# Patient Record
Sex: Female | Born: 1955 | Race: White | Hispanic: No | Smoking: Former smoker
Health system: Southern US, Community
[De-identification: ages and names within clinical notes are randomized; demographics above are authoritative.]

## PROBLEM LIST (undated history)

## (undated) DIAGNOSIS — E785 Hyperlipidemia, unspecified: Secondary | ICD-10-CM

## (undated) DIAGNOSIS — T7840XA Allergy, unspecified, initial encounter: Secondary | ICD-10-CM

## (undated) DIAGNOSIS — J449 Chronic obstructive pulmonary disease, unspecified: Secondary | ICD-10-CM

## (undated) DIAGNOSIS — F32A Depression, unspecified: Secondary | ICD-10-CM

## (undated) DIAGNOSIS — I509 Heart failure, unspecified: Secondary | ICD-10-CM

## (undated) DIAGNOSIS — J439 Emphysema, unspecified: Secondary | ICD-10-CM

## (undated) DIAGNOSIS — N393 Stress incontinence (female) (male): Secondary | ICD-10-CM

## (undated) DIAGNOSIS — M797 Fibromyalgia: Secondary | ICD-10-CM

## (undated) DIAGNOSIS — K219 Gastro-esophageal reflux disease without esophagitis: Secondary | ICD-10-CM

## (undated) DIAGNOSIS — H269 Unspecified cataract: Secondary | ICD-10-CM

## (undated) DIAGNOSIS — F419 Anxiety disorder, unspecified: Secondary | ICD-10-CM

## (undated) DIAGNOSIS — M1990 Unspecified osteoarthritis, unspecified site: Secondary | ICD-10-CM

## (undated) DIAGNOSIS — R0902 Hypoxemia: Secondary | ICD-10-CM

## (undated) DIAGNOSIS — M791 Myalgia, unspecified site: Secondary | ICD-10-CM

## (undated) DIAGNOSIS — G4730 Sleep apnea, unspecified: Secondary | ICD-10-CM

## (undated) DIAGNOSIS — L821 Other seborrheic keratosis: Secondary | ICD-10-CM

## (undated) DIAGNOSIS — F329 Major depressive disorder, single episode, unspecified: Secondary | ICD-10-CM

## (undated) DIAGNOSIS — I119 Hypertensive heart disease without heart failure: Secondary | ICD-10-CM

## (undated) DIAGNOSIS — E669 Obesity, unspecified: Secondary | ICD-10-CM

## (undated) DIAGNOSIS — I10 Essential (primary) hypertension: Secondary | ICD-10-CM

## (undated) DIAGNOSIS — I350 Nonrheumatic aortic (valve) stenosis: Secondary | ICD-10-CM

## (undated) DIAGNOSIS — I719 Aortic aneurysm of unspecified site, without rupture: Secondary | ICD-10-CM

## (undated) DIAGNOSIS — M5430 Sciatica, unspecified side: Secondary | ICD-10-CM

## (undated) HISTORY — DX: Unspecified cataract: H26.9

## (undated) HISTORY — PX: TOTAL ABDOMINAL HYSTERECTOMY: SHX209

## (undated) HISTORY — DX: Sciatica, unspecified side: M54.30

## (undated) HISTORY — DX: Emphysema, unspecified: J43.9

## (undated) HISTORY — DX: Unspecified osteoarthritis, unspecified site: M19.90

## (undated) HISTORY — DX: Heart failure, unspecified: I50.9

## (undated) HISTORY — PX: TONSILLECTOMY: SUR1361

## (undated) HISTORY — DX: Gastro-esophageal reflux disease without esophagitis: K21.9

## (undated) HISTORY — DX: Nonrheumatic aortic (valve) stenosis: I35.0

## (undated) HISTORY — DX: Stress incontinence (female) (male): N39.3

## (undated) HISTORY — DX: Fibromyalgia: M79.7

## (undated) HISTORY — DX: Chronic obstructive pulmonary disease, unspecified: J44.9

## (undated) HISTORY — DX: Major depressive disorder, single episode, unspecified: F32.9

## (undated) HISTORY — DX: Hypoxemia: R09.02

## (undated) HISTORY — PX: EYE SURGERY: SHX253

## (undated) HISTORY — DX: Hyperlipidemia, unspecified: E78.5

## (undated) HISTORY — DX: Myalgia, unspecified site: M79.10

## (undated) HISTORY — DX: Depression, unspecified: F32.A

## (undated) HISTORY — PX: TUBAL LIGATION: SHX77

## (undated) HISTORY — DX: Allergy, unspecified, initial encounter: T78.40XA

## (undated) HISTORY — DX: Essential (primary) hypertension: I10

## (undated) HISTORY — PX: ABDOMINAL HYSTERECTOMY: SHX81

## (undated) HISTORY — DX: Obesity, unspecified: E66.9

## (undated) HISTORY — DX: Anxiety disorder, unspecified: F41.9

## (undated) HISTORY — DX: Hypertensive heart disease without heart failure: I11.9

## (undated) HISTORY — PX: CARDIAC CATHETERIZATION: SHX172

## (undated) HISTORY — DX: Other seborrheic keratosis: L82.1

---

## 2004-06-20 LAB — HM MAMMOGRAPHY

## 2005-04-01 LAB — HM PAP SMEAR

## 2007-12-29 ENCOUNTER — Ambulatory Visit: Payer: Self-pay | Admitting: Ophthalmology

## 2008-01-11 ENCOUNTER — Ambulatory Visit: Payer: Self-pay | Admitting: Ophthalmology

## 2008-03-13 ENCOUNTER — Ambulatory Visit: Payer: Self-pay | Admitting: Ophthalmology

## 2008-07-23 ENCOUNTER — Emergency Department: Payer: Self-pay | Admitting: Emergency Medicine

## 2009-02-19 ENCOUNTER — Inpatient Hospital Stay: Payer: Self-pay | Admitting: Internal Medicine

## 2011-02-10 ENCOUNTER — Emergency Department: Payer: Self-pay | Admitting: *Deleted

## 2012-11-14 ENCOUNTER — Emergency Department: Payer: Self-pay | Admitting: Internal Medicine

## 2012-11-14 LAB — COMPREHENSIVE METABOLIC PANEL
Albumin: 3.9 g/dL (ref 3.4–5.0)
Alkaline Phosphatase: 88 U/L (ref 50–136)
BUN: 7 mg/dL (ref 7–18)
Bilirubin,Total: 0.4 mg/dL (ref 0.2–1.0)
Calcium, Total: 7.8 mg/dL — ABNORMAL LOW (ref 8.5–10.1)
Co2: 31 mmol/L (ref 21–32)
EGFR (African American): >60
EGFR (Non-African Amer.): >60
Osmolality: 280 (ref 275–301)
Potassium: 3.7 mmol/L (ref 3.5–5.1)
SGOT(AST): 25 U/L (ref 15–37)
SGPT (ALT): 22 U/L (ref 12–78)
Sodium: 142 mmol/L (ref 136–145)
Total Protein: 7.3 g/dL (ref 6.4–8.2)

## 2012-11-14 LAB — URINALYSIS, COMPLETE
Bilirubin,UR: NEGATIVE
Glucose,UR: NEGATIVE mg/dL (ref 0–75)
Ketone: NEGATIVE
Leukocyte Esterase: NEGATIVE
Nitrite: NEGATIVE
Protein: NEGATIVE
Specific Gravity: 1.002 (ref 1.003–1.030)
Squamous Epithelial: NONE SEEN

## 2012-11-14 LAB — CBC
HGB: 15.3 g/dL (ref 12.0–16.0)
MCHC: 33.9 g/dL (ref 32.0–36.0)
MCV: 90 fL (ref 80–100)
Platelet: 148 10*3/uL — ABNORMAL LOW (ref 150–440)
WBC: 7.1 10*3/uL (ref 3.6–11.0)

## 2013-01-13 ENCOUNTER — Emergency Department: Payer: Self-pay | Admitting: Emergency Medicine

## 2013-01-13 LAB — COMPREHENSIVE METABOLIC PANEL
Alkaline Phosphatase: 72 U/L
BUN: 6 mg/dL — ABNORMAL LOW (ref 7–18)
Chloride: 108 mmol/L — ABNORMAL HIGH (ref 98–107)
Co2: 30 mmol/L (ref 21–32)
Creatinine: 0.68 mg/dL (ref 0.60–1.30)
EGFR (African American): >60
Glucose: 128 mg/dL — ABNORMAL HIGH (ref 65–99)
Osmolality: 284 (ref 275–301)
Potassium: 3.1 mmol/L — ABNORMAL LOW (ref 3.5–5.1)
SGOT(AST): 29 U/L (ref 15–37)
Sodium: 143 mmol/L (ref 136–145)

## 2013-01-13 LAB — URINALYSIS, COMPLETE
Bacteria: NONE SEEN
Blood: NEGATIVE
Nitrite: NEGATIVE
Ph: 7 (ref 4.5–8.0)
Protein: NEGATIVE
Specific Gravity: 1.004 (ref 1.003–1.030)
WBC UR: <1 /HPF (ref 0–5)

## 2013-01-13 LAB — CBC WITH DIFFERENTIAL/PLATELET
Basophil #: 0.1 10*3/uL (ref 0.0–0.1)
Eosinophil #: 0.2 10*3/uL (ref 0.0–0.7)
Eosinophil %: 2.4 %
HCT: 41.2 % (ref 35.0–47.0)
HGB: 13.9 g/dL (ref 12.0–16.0)
Lymphocyte %: 24.6 %
MCH: 30.1 pg (ref 26.0–34.0)
MCV: 89 fL (ref 80–100)
Neutrophil %: 65.8 %
Platelet: 112 10*3/uL — ABNORMAL LOW (ref 150–440)
RBC: 4.62 10*6/uL (ref 3.80–5.20)
RDW: 15.4 % — ABNORMAL HIGH (ref 11.5–14.5)
WBC: 7.4 10*3/uL (ref 3.6–11.0)

## 2013-01-13 LAB — MAGNESIUM: Magnesium: 0.7 mg/dL — ABNORMAL LOW

## 2013-06-16 ENCOUNTER — Emergency Department: Payer: Self-pay | Admitting: Emergency Medicine

## 2013-06-16 LAB — URINALYSIS, COMPLETE
Bilirubin,UR: NEGATIVE
Glucose,UR: NEGATIVE mg/dL (ref 0–75)
Ketone: NEGATIVE
Leukocyte Esterase: NEGATIVE
Nitrite: NEGATIVE
PH: 7 (ref 4.5–8.0)
Protein: NEGATIVE
RBC,UR: 2 /HPF (ref 0–5)
SPECIFIC GRAVITY: 1.017 (ref 1.003–1.030)
Squamous Epithelial: 5

## 2013-06-16 LAB — COMPREHENSIVE METABOLIC PANEL
ALK PHOS: 80 U/L
Albumin: 3.8 g/dL (ref 3.4–5.0)
Anion Gap: 9 (ref 7–16)
BUN: 13 mg/dL (ref 7–18)
Bilirubin,Total: 0.3 mg/dL (ref 0.2–1.0)
CALCIUM: 9 mg/dL (ref 8.5–10.1)
CHLORIDE: 103 mmol/L (ref 98–107)
CREATININE: 0.63 mg/dL (ref 0.60–1.30)
Co2: 31 mmol/L (ref 21–32)
EGFR (African American): >60
GLUCOSE: 93 mg/dL (ref 65–99)
OSMOLALITY: 285 (ref 275–301)
Potassium: 4 mmol/L (ref 3.5–5.1)
SGOT(AST): 17 U/L (ref 15–37)
SGPT (ALT): 17 U/L (ref 12–78)
Sodium: 143 mmol/L (ref 136–145)
Total Protein: 7.5 g/dL (ref 6.4–8.2)

## 2013-06-16 LAB — CBC WITH DIFFERENTIAL/PLATELET
BASOS PCT: 0.6 %
Basophil #: 0.1 10*3/uL (ref 0.0–0.1)
EOS ABS: 0.1 10*3/uL (ref 0.0–0.7)
EOS PCT: 1.3 %
HCT: 45.6 % (ref 35.0–47.0)
HGB: 14.9 g/dL (ref 12.0–16.0)
Lymphocyte #: 2.4 10*3/uL (ref 1.0–3.6)
Lymphocyte %: 26.2 %
MCH: 30.4 pg (ref 26.0–34.0)
MCHC: 32.8 g/dL (ref 32.0–36.0)
MCV: 93 fL (ref 80–100)
MONO ABS: 0.6 x10 3/mm (ref 0.2–0.9)
MONOS PCT: 6.3 %
Neutrophil #: 6 10*3/uL (ref 1.4–6.5)
Neutrophil %: 65.6 %
PLATELETS: 126 10*3/uL — AB (ref 150–440)
RBC: 4.91 10*6/uL (ref 3.80–5.20)
RDW: 15 % — AB (ref 11.5–14.5)
WBC: 9.1 10*3/uL (ref 3.6–11.0)

## 2013-06-16 LAB — LIPASE, BLOOD: LIPASE: 270 U/L (ref 73–393)

## 2013-06-16 LAB — TROPONIN I: Troponin-I: <0.02 ng/mL

## 2013-06-16 LAB — MAGNESIUM: Magnesium: 1.4 mg/dL — ABNORMAL LOW

## 2013-10-28 ENCOUNTER — Emergency Department: Payer: Self-pay | Admitting: Emergency Medicine

## 2014-03-27 ENCOUNTER — Ambulatory Visit: Payer: Self-pay

## 2014-10-16 ENCOUNTER — Ambulatory Visit: Payer: Self-pay | Admitting: Unknown Physician Specialty

## 2014-12-08 ENCOUNTER — Ambulatory Visit: Payer: Self-pay | Admitting: Unknown Physician Specialty

## 2015-01-05 ENCOUNTER — Encounter: Payer: Self-pay | Admitting: Unknown Physician Specialty

## 2015-01-05 ENCOUNTER — Ambulatory Visit (INDEPENDENT_AMBULATORY_CARE_PROVIDER_SITE_OTHER): Payer: BLUE CROSS/BLUE SHIELD | Admitting: Unknown Physician Specialty

## 2015-01-05 VITALS — BP 123/71 | HR 69 | Temp 98.6°F | Ht 65.2 in | Wt 198.2 lb

## 2015-01-05 DIAGNOSIS — G4700 Insomnia, unspecified: Secondary | ICD-10-CM

## 2015-01-05 DIAGNOSIS — I119 Hypertensive heart disease without heart failure: Secondary | ICD-10-CM | POA: Insufficient documentation

## 2015-01-05 DIAGNOSIS — M797 Fibromyalgia: Secondary | ICD-10-CM

## 2015-01-05 DIAGNOSIS — E785 Hyperlipidemia, unspecified: Secondary | ICD-10-CM | POA: Diagnosis not present

## 2015-01-05 DIAGNOSIS — L821 Other seborrheic keratosis: Secondary | ICD-10-CM

## 2015-01-05 DIAGNOSIS — J432 Centrilobular emphysema: Secondary | ICD-10-CM | POA: Insufficient documentation

## 2015-01-05 DIAGNOSIS — J42 Unspecified chronic bronchitis: Secondary | ICD-10-CM

## 2015-01-05 DIAGNOSIS — K219 Gastro-esophageal reflux disease without esophagitis: Secondary | ICD-10-CM | POA: Insufficient documentation

## 2015-01-05 DIAGNOSIS — J209 Acute bronchitis, unspecified: Secondary | ICD-10-CM | POA: Diagnosis not present

## 2015-01-05 DIAGNOSIS — I509 Heart failure, unspecified: Secondary | ICD-10-CM

## 2015-01-05 DIAGNOSIS — J449 Chronic obstructive pulmonary disease, unspecified: Secondary | ICD-10-CM

## 2015-01-05 DIAGNOSIS — I350 Nonrheumatic aortic (valve) stenosis: Secondary | ICD-10-CM | POA: Insufficient documentation

## 2015-01-05 DIAGNOSIS — M5430 Sciatica, unspecified side: Secondary | ICD-10-CM | POA: Insufficient documentation

## 2015-01-05 DIAGNOSIS — I129 Hypertensive chronic kidney disease with stage 1 through stage 4 chronic kidney disease, or unspecified chronic kidney disease: Secondary | ICD-10-CM

## 2015-01-05 DIAGNOSIS — E669 Obesity, unspecified: Secondary | ICD-10-CM | POA: Insufficient documentation

## 2015-01-05 DIAGNOSIS — I110 Hypertensive heart disease with heart failure: Secondary | ICD-10-CM

## 2015-01-05 DIAGNOSIS — Z0000 Encounter for general adult medical examination without abnormal findings: Secondary | ICD-10-CM | POA: Diagnosis not present

## 2015-01-05 DIAGNOSIS — F439 Reaction to severe stress, unspecified: Secondary | ICD-10-CM

## 2015-01-05 LAB — LIPID PANEL PICCOLO, WAIVED
CHOL/HDL RATIO PICCOLO,WAIVE: 3.7 mg/dL
Cholesterol Piccolo, Waived: 142 mg/dL (ref <200)
HDL CHOL PICCOLO, WAIVED: 39 mg/dL — AB (ref >59)
LDL Chol Calc Piccolo Waived: 76 mg/dL (ref <100)
TRIGLYCERIDES PICCOLO,WAIVED: 138 mg/dL (ref <150)
VLDL CHOL CALC PICCOLO,WAIVE: 28 mg/dL (ref <30)

## 2015-01-05 LAB — MICROALBUMIN, URINE WAIVED
Creatinine, Urine Waived: 50 mg/dL (ref 10–300)
Microalb, Ur Waived: 10 mg/L (ref 0–19)
Microalb/Creat Ratio: <30 mg/g (ref <30)

## 2015-01-05 MED ORDER — DOXYCYCLINE HYCLATE 100 MG PO TABS: 100.0000 mg | ORAL_TABLET | Freq: Two times a day (BID) | ORAL | Status: DC

## 2015-01-05 MED ORDER — ALBUTEROL SULFATE HFA 108 (90 BASE) MCG/ACT IN AERS: 2.0000 | INHALATION_SPRAY | Freq: Four times a day (QID) | RESPIRATORY_TRACT | Status: DC | PRN

## 2015-01-05 NOTE — Assessment & Plan Note (Signed)
Reviewing Lipid panel.  LDL is 76.  Stable, continue present medications.

## 2015-01-05 NOTE — Patient Instructions (Signed)
Please do call to schedule your mammogram; the number to schedule one at either [facility] or [facility] is [phone]   

## 2015-01-05 NOTE — Progress Notes (Signed)
BP 123/71 mmHg  Pulse 69  Temp(Src) 98.6 F (37 C)  Ht 5' 5.2" (1.656 m)  Wt 198 lb 3.2 oz (89.903 kg)  BMI 32.78 kg/m2  SpO2 89%  LMP  (LMP Unknown)   Subjective:    Patient ID: Brandi Steele, female    DOB: 12-05-1955, 59 y.o.   MRN: [id number]  HPI: GARIELLE Steele is a 59 y.o. female  Chief Complaint  Patient presents with  . Hypertension  . Hyperlipidemia  . other    pt states she is interested in a mammogram and colonoscopy  . Medication Refill    pt states she needs a refill on her inhaler   Hypertension Using medications without difficulty   No problems or lightheadedness No chest pain with exertion or shortness of breath No Edema  Hyperlipidemia Using medications without problems No Muscle aches  Diet compliance: Exercise:  COPD Feels symptoms are well controlled except having a bout of the flu.  Needs a refill of inhaler that she uses BID Using medications without problems: Taking Proair prn and not using when she is sick but on a cruise and had an acute exacerbation of COPD ER visits since last visit: nebulizer on ship.  Did not get an abtibiotic Increased cough: yes Increased SOB: yes She is smoking 1/2 ppd.    Relevant past medical, surgical, family and social history reviewed and updated as indicated. Interim medical history since our last visit reviewed. Allergies and medications reviewed and updated.  Review of Systems  Per HPI unless specifically indicated above     Objective:    BP 123/71 mmHg  Pulse 69  Temp(Src) 98.6 F (37 C)  Ht 5' 5.2" (1.656 m)  Wt 198 lb 3.2 oz (89.903 kg)  BMI 32.78 kg/m2  SpO2 89%  LMP  (LMP Unknown)  Wt Readings from Last 3 Encounters:  01/05/15 198 lb 3.2 oz (89.903 kg)  04/24/14 197 lb (89.359 kg)    Physical Exam  Constitutional: She is oriented to person, place, and time. She appears well-developed and well-nourished. No distress.  HENT:  Head: Normocephalic and atraumatic.  Eyes:  Conjunctivae and lids are normal. Right eye exhibits no discharge. Left eye exhibits no discharge. No scleral icterus.  Neck: Normal range of motion. Neck supple. No JVD present. Carotid bruit is not present.  Cardiovascular: Normal rate, regular rhythm and normal heart sounds.   Pulmonary/Chest: Effort normal. No respiratory distress. She has wheezes. She has rales.  Abdominal: Normal appearance. There is no splenomegaly or hepatomegaly.  Musculoskeletal: Normal range of motion.  Neurological: She is alert and oriented to person, place, and time.  Skin: Skin is warm, dry and intact. No rash noted. No pallor.  Psychiatric: She has a normal mood and affect. Her behavior is normal. Judgment and thought content normal.       Assessment & Plan:   Problem List Items Addressed This Visit      Unprioritized   COPD (chronic obstructive pulmonary disease) (HCC)   Relevant Medications   albuterol (PROVENTIL HFA;VENTOLIN HFA) 108 (90 BASE) MCG/ACT inhaler   Hyperlipidemia - Primary    Reviewing Lipid panel.  LDL is 76.  Stable, continue present medications.        Relevant Orders   Lipid Panel [place], Waived   Hypertensive CKD (chronic kidney disease)    Stable, continue present medications.        Relevant Orders   Uric acid   Microalbumin, Urine Waived  Comprehensive metabolic panel    Other Visit Diagnoses    Acute exacerbation of chronic bronchitis ([place])        Rx for Doxycycline.  Chest x-ray last year.      Relevant Medications    albuterol (PROVENTIL HFA;VENTOLIN HFA) 108 (90 BASE) MCG/ACT inhaler    Routine general medical examination at a health care facility        Relevant Orders    Ambulatory referral to Gastroenterology    MM DIGITAL SCREENING BILATERAL       Set up for low dose CT screening.   Health maintenance items of colonoscopy and mammogram  Follow up plan: Return for physical and needs spirometry.

## 2015-01-05 NOTE — Assessment & Plan Note (Signed)
Stable, continue present medications.   

## 2015-01-06 LAB — COMPREHENSIVE METABOLIC PANEL
ALBUMIN: 4.2 g/dL (ref 3.6–4.8)
ALT: 23 IU/L (ref 0–32)
AST: 21 IU/L (ref 0–40)
Albumin/Globulin Ratio: 2 (ref 1.1–2.5)
Alkaline Phosphatase: 63 IU/L (ref 39–117)
BUN / CREAT RATIO: 18 (ref 11–26)
BUN: 15 mg/dL (ref 8–27)
Bilirubin Total: 0.2 mg/dL (ref 0.0–1.2)
CO2: 29 mmol/L (ref 18–29)
CREATININE: 0.83 mg/dL (ref 0.57–1.00)
Calcium: 8.9 mg/dL (ref 8.7–10.3)
Chloride: 100 mmol/L (ref 97–106)
GFR calc Af Amer: 88 mL/min/{1.73_m2} (ref >59)
GFR, EST NON AFRICAN AMERICAN: 76 mL/min/{1.73_m2} (ref >59)
GLUCOSE: 87 mg/dL (ref 65–99)
Globulin, Total: 2.1 g/dL (ref 1.5–4.5)
Potassium: 4.4 mmol/L (ref 3.5–5.2)
Sodium: 143 mmol/L (ref 136–144)
TOTAL PROTEIN: 6.3 g/dL (ref 6.0–8.5)

## 2015-01-06 LAB — URIC ACID: Uric Acid: 5.1 mg/dL (ref 2.5–7.1)

## 2015-01-07 ENCOUNTER — Encounter (HOSPITAL_COMMUNITY): Payer: Self-pay | Admitting: Nurse Practitioner

## 2015-01-07 ENCOUNTER — Emergency Department (HOSPITAL_COMMUNITY): Payer: BLUE CROSS/BLUE SHIELD

## 2015-01-07 ENCOUNTER — Emergency Department (HOSPITAL_COMMUNITY)
Admission: EM | Admit: 2015-01-07 | Discharge: 2015-01-08 | Disposition: A | Discharge status: Home / Self Care | Payer: BLUE CROSS/BLUE SHIELD | Attending: Emergency Medicine | Admitting: Emergency Medicine

## 2015-01-07 DIAGNOSIS — I509 Heart failure, unspecified: Secondary | ICD-10-CM | POA: Insufficient documentation

## 2015-01-07 DIAGNOSIS — Z8659 Personal history of other mental and behavioral disorders: Secondary | ICD-10-CM | POA: Diagnosis not present

## 2015-01-07 DIAGNOSIS — I110 Hypertensive heart disease with heart failure: Secondary | ICD-10-CM | POA: Insufficient documentation

## 2015-01-07 DIAGNOSIS — K219 Gastro-esophageal reflux disease without esophagitis: Secondary | ICD-10-CM | POA: Insufficient documentation

## 2015-01-07 DIAGNOSIS — E669 Obesity, unspecified: Secondary | ICD-10-CM | POA: Insufficient documentation

## 2015-01-07 DIAGNOSIS — J069 Acute upper respiratory infection, unspecified: Secondary | ICD-10-CM

## 2015-01-07 DIAGNOSIS — M5430 Sciatica, unspecified side: Secondary | ICD-10-CM | POA: Insufficient documentation

## 2015-01-07 DIAGNOSIS — F17210 Nicotine dependence, cigarettes, uncomplicated: Secondary | ICD-10-CM | POA: Diagnosis not present

## 2015-01-07 DIAGNOSIS — Z7902 Long term (current) use of antithrombotics/antiplatelets: Secondary | ICD-10-CM | POA: Diagnosis not present

## 2015-01-07 DIAGNOSIS — R05 Cough: Secondary | ICD-10-CM | POA: Diagnosis present

## 2015-01-07 DIAGNOSIS — M797 Fibromyalgia: Secondary | ICD-10-CM | POA: Diagnosis not present

## 2015-01-07 DIAGNOSIS — J441 Chronic obstructive pulmonary disease with (acute) exacerbation: Secondary | ICD-10-CM | POA: Diagnosis not present

## 2015-01-07 DIAGNOSIS — Z872 Personal history of diseases of the skin and subcutaneous tissue: Secondary | ICD-10-CM | POA: Diagnosis not present

## 2015-01-07 DIAGNOSIS — Z7982 Long term (current) use of aspirin: Secondary | ICD-10-CM | POA: Diagnosis not present

## 2015-01-07 DIAGNOSIS — E785 Hyperlipidemia, unspecified: Secondary | ICD-10-CM | POA: Diagnosis not present

## 2015-01-07 DIAGNOSIS — Z792 Long term (current) use of antibiotics: Secondary | ICD-10-CM | POA: Diagnosis not present

## 2015-01-07 DIAGNOSIS — R079 Chest pain, unspecified: Secondary | ICD-10-CM | POA: Diagnosis not present

## 2015-01-07 DIAGNOSIS — Z79899 Other long term (current) drug therapy: Secondary | ICD-10-CM | POA: Diagnosis not present

## 2015-01-07 LAB — BASIC METABOLIC PANEL
ANION GAP: 10 (ref 5–15)
BUN: 15 mg/dL (ref 6–20)
CHLORIDE: 102 mmol/L (ref 101–111)
CO2: 30 mmol/L (ref 22–32)
CREATININE: 0.91 mg/dL (ref 0.44–1.00)
Calcium: 8.8 mg/dL — ABNORMAL LOW (ref 8.9–10.3)
GFR calc non Af Amer: >60 mL/min (ref >60)
Glucose, Bld: 103 mg/dL — ABNORMAL HIGH (ref 65–99)
POTASSIUM: 4.2 mmol/L (ref 3.5–5.1)
Sodium: 142 mmol/L (ref 135–145)

## 2015-01-07 LAB — I-STAT TROPONIN, ED: Troponin i, poc: 0 ng/mL (ref 0.00–0.08)

## 2015-01-07 LAB — BRAIN NATRIURETIC PEPTIDE: B NATRIURETIC PEPTIDE 5: 20.1 pg/mL (ref 0.0–100.0)

## 2015-01-07 LAB — CBC
HEMATOCRIT: 43.5 % (ref 36.0–46.0)
HEMOGLOBIN: 14.2 g/dL (ref 12.0–15.0)
MCH: 30.3 pg (ref 26.0–34.0)
MCHC: 32.6 g/dL (ref 30.0–36.0)
MCV: 92.9 fL (ref 78.0–100.0)
Platelets: 174 10*3/uL (ref 150–400)
RBC: 4.68 MIL/uL (ref 3.87–5.11)
RDW: 15.1 % (ref 11.5–15.5)
WBC: 10.6 10*3/uL — ABNORMAL HIGH (ref 4.0–10.5)

## 2015-01-07 MED ORDER — SODIUM CHLORIDE 0.9 % IV BOLUS (SEPSIS)
500.0000 mL | Freq: Once | INTRAVENOUS | Status: AC
  Administered 2015-01-07: 500 mL via INTRAVENOUS

## 2015-01-07 MED ORDER — IOHEXOL 350 MG/ML SOLN
100.0000 mL | Freq: Once | INTRAVENOUS | Status: AC | PRN
  Administered 2015-01-07: 100 mL via INTRAVENOUS

## 2015-01-07 MED ORDER — ASPIRIN 81 MG PO CHEW
324.0000 mg | CHEWABLE_TABLET | Freq: Once | ORAL | Status: AC
  Administered 2015-01-07: 324 mg via ORAL
  Filled 2015-01-07: qty 4

## 2015-01-07 NOTE — ED Notes (Signed)
Bed: [id number] Expected date:  Expected time:  Means of arrival:  Comments: Brandi Steele

## 2015-01-07 NOTE — ED Notes (Signed)
Per pt she has had intermittent left CP w/ radiation to back since 1100.  Pt has significant cardiac hx.  Recent 14 hour car trip.  Pt has had an URI x 1 week.

## 2015-01-07 NOTE — ED Notes (Signed)
Pt O2 sats dropped to 88% on RA.  1L O2 applied via [place] w/ improvement in sats to 92%.

## 2015-01-07 NOTE — ED Provider Notes (Signed)
CSN: [id number]     Arrival date & time 01/07/15  2010 History   First MD Initiated Contact with Patient 01/07/15 2031     Chief Complaint  Patient presents with  . Chest Pain     (Consider location/radiation/quality/duration/timing/severity/associated sxs/prior Treatment) HPI Comments: Sore throat, cough, congestion started 1 week ago (Friday started doxycycline)-felt better yesterday Cough the same, light green sputum Today started having chest pain Sharp pain Comes and goes If rest it goes away Worsens with exertion, starts with exertion Sharp substernal, goes to left side and back, no neck/arm pain SOB with coughing, hadn't paid attention to whether present with exertion No hx of smiilar CP  Hx COPD, no O2 at home, CPAP at night, aortic stenosis CHF, hyperlipidemia   Patient is a 59 y.o. female presenting with chest pain.  Chest Pain Associated symptoms: cough and shortness of breath (with cougyhing)   Associated symptoms: no abdominal pain, no back pain, no diaphoresis, no fever, no headache, no nausea and not vomiting     Past Medical History  Diagnosis Date  . Aortic stenosis   . GERD (gastroesophageal reflux disease)   . Obesity   . COPD (chronic obstructive pulmonary disease) ([place])   . Fibromyalgia   . Hypertension   . Hyperlipidemia   . Stress incontinence   . Depression   . Anxiety   . Sciatica   . CHF (congestive heart failure) ([place])   . Myalgia   . Seborrheic keratosis   . Hypertensive heart disease    Past Surgical History  Procedure Laterality Date  . Abdominal hysterectomy      partial   Family History  Problem Relation Age of Onset  . Stroke Mother   . Hypertension Mother   . Heart disease Father   . Diabetes Brother   . Stroke Maternal Grandmother   . Stroke Maternal Grandfather   . Heart disease Paternal Grandmother   . Heart disease Paternal Grandfather    Social History  Substance Use Topics  . Smoking status: Current Every Day  Smoker -- 0.50 packs/day    Types: Cigarettes  . Smokeless tobacco: Never Used  . Alcohol Use: No   OB History    No data available     Review of Systems  Constitutional: Negative for fever and diaphoresis.  HENT: Negative for sore throat.   Eyes: Negative for visual disturbance.  Respiratory: Positive for cough and shortness of breath (with cougyhing).   Cardiovascular: Positive for chest pain.  Gastrointestinal: Negative for nausea, vomiting and abdominal pain.  Genitourinary: Negative for difficulty urinating.  Musculoskeletal: Negative for back pain and neck pain.  Skin: Negative for rash.  Neurological: Negative for syncope and headaches.      Allergies  Morphine and related; Celexa; Effexor; Shellfish allergy; and Wellbutrin  Home Medications   Prior to Admission medications   Medication Sig Start Date End Date Taking? Authorizing Provider  albuterol (PROVENTIL HFA;VENTOLIN HFA) 108 (90 BASE) MCG/ACT inhaler Inhale 2 puffs into the lungs every 6 (six) hours as needed for wheezing or shortness of breath. 01/05/15  Yes Kathrine Haddock, NP  aspirin EC 81 MG tablet Take 81 mg by mouth every evening.   Yes Historical Provider, MD  atorvastatin (LIPITOR) 40 MG tablet Take 20 mg by mouth every evening.    Yes Historical Provider, MD  clopidogrel (PLAVIX) 75 MG tablet Take 75 mg by mouth every evening.    Yes Historical Provider, MD  cyclobenzaprine (FLEXERIL) 10 MG tablet Take  10 mg by mouth 3 (three) times daily as needed for muscle spasms (for firbromyalgia.).    Yes Historical Provider, MD  doxycycline (VIBRA-TABS) 100 MG tablet Take 1 tablet (100 mg total) by mouth 2 (two) times daily. 01/05/15  Yes Kathrine Haddock, NP  esomeprazole (NEXIUM) 40 MG capsule Take 40 mg by mouth every evening.    Yes Historical Provider, MD  furosemide (LASIX) 40 MG tablet Take 40 mg by mouth every morning.    Yes Historical Provider, MD  gabapentin (NEURONTIN) 600 MG tablet Take 600 mg by mouth every  evening.    Yes Historical Provider, MD  lisinopril (PRINIVIL,ZESTRIL) 2.5 MG tablet Take 2.5 mg by mouth every morning.    Yes Historical Provider, MD  Magnesium Citrate 100 MG TABS Take 200 mg by mouth 2 (two) times daily.    Yes Historical Provider, MD  Potassium Gluconate 550 MG TABS Take 1,650 mg by mouth daily.   Yes Historical Provider, MD  zolpidem (AMBIEN) 10 MG tablet Take 10 mg by mouth at bedtime as needed for sleep.   Yes Historical Provider, MD   BP 110/60 mmHg  Pulse 72  Temp(Src) 98.6 F (37 C) (Oral)  Resp 16  Ht 5\' 5"  (1.651 m)  Wt 198 lb (89.812 kg)  BMI 32.95 kg/m2  SpO2 92%  LMP  (LMP Unknown) Physical Exam  Constitutional: She is oriented to person, place, and time. She appears well-developed and well-nourished. No distress.  HENT:  Head: Normocephalic and atraumatic.  Eyes: Conjunctivae and EOM are normal.  Neck: Normal range of motion. No JVD present.  Cardiovascular: Normal rate, regular rhythm, normal heart sounds and intact distal pulses.  Exam reveals no gallop and no friction rub.   No murmur heard. Pulmonary/Chest: Effort normal and breath sounds normal. No respiratory distress. She has no wheezes. She has no rales. She exhibits no tenderness.  Abdominal: Soft. She exhibits no distension. There is no tenderness. There is no guarding.  Musculoskeletal: She exhibits no edema (trace/sock lines) or tenderness.  Neurological: She is alert and oriented to person, place, and time.  Skin: Skin is warm and dry. No rash noted. She is not diaphoretic. No erythema.  Nursing note and vitals reviewed.   ED Course  Procedures (including critical care time) Labs Review Labs Reviewed  BASIC METABOLIC PANEL - Abnormal; Notable for the following:    Glucose, Bld 103 (*)    Calcium 8.8 (*)    All other components within normal limits  CBC - Abnormal; Notable for the following:    WBC 10.6 (*)    All other components within normal limits  BRAIN NATRIURETIC PEPTIDE   I-STAT TROPOININ, ED  Randolm Idol, ED    Imaging Review Dg Chest 2 View  01/07/2015  CLINICAL DATA:  Acute onset of severe shortness of breath, cough and congestion. Initial encounter. EXAM: CHEST  2 VIEW COMPARISON:  None. FINDINGS: The lungs are well-aerated. There is somewhat unusual left hilar prominence. There is no evidence of focal opacification, pleural effusion or pneumothorax. The heart is normal in size; the mediastinal contour is otherwise within normal limits. No acute osseous abnormalities are seen. IMPRESSION: Somewhat unusual left hilar prominence noted. CT of the chest would be helpful for further evaluation, when and as deemed clinically appropriate. Electronically Signed   By: Garald Balding M.D.   On: 01/07/2015 21:11   Ct Angio Chest Pe W/cm &/or Wo Cm  01/07/2015  CLINICAL DATA:  Acute onset of intermittent left-sided  chest pain, radiating to the back. Recent 14 hour car trip. Initial encounter. EXAM: CT ANGIOGRAPHY CHEST WITH CONTRAST TECHNIQUE: Multidetector CT imaging of the chest was performed using the standard protocol during bolus administration of intravenous contrast. Multiplanar CT image reconstructions and MIPs were obtained to evaluate the vascular anatomy. CONTRAST:  147mL OMNIPAQUE IOHEXOL 350 MG/ML SOLN COMPARISON:  Chest radiograph performed earlier today at 9:01 p.m. FINDINGS: There is no evidence of pulmonary embolus. Minimal bibasilar atelectasis is noted. The lungs are otherwise clear. There is no evidence of significant focal consolidation, pleural effusion or pneumothorax. No masses are identified; no abnormal focal contrast enhancement is seen. Mildly prominent subcarinal and right paratracheal nodes are seen, measuring up to 1.3 cm in short axis. No pericardial effusion is identified. The great vessels are grossly unremarkable in appearance. No axillary lymphadenopathy is seen. The visualized portions of the thyroid gland are unremarkable in appearance.  The visualized portions of the liver and spleen are unremarkable. No acute osseous abnormalities are seen. There is mild chronic loss of height at T7. Review of the MIP images confirms the above findings. IMPRESSION: 1. No evidence of pulmonary embolus. 2. Minimal bibasilar atelectasis noted.  Lungs otherwise clear. 3. Mildly prominent mediastinal nodes seen, measuring up to 1.3 cm in short axis. Electronically Signed   By: Garald Balding M.D.   On: 01/07/2015 23:06   I have personally reviewed and evaluated these images and lab results as part of my medical decision-making.   EKG Interpretation   Date/Time:  Sunday January 07 2015 20:16:02 EST Ventricular Rate:  86 PR Interval:  144 QRS Duration: 101 QT Interval:  355 QTC Calculation: 425 R Axis:   -90 Text Interpretation:  Sinus rhythm Incomplete RBBB and LAFB RSR' in V1 or  V2, right VCD or RVH Baseline wander in lead(s) II aVR No significant  change since last tracing Confirmed by [facility] MD, Tristyn Demarest ([id number]) on  01/07/2015 8:38:52 PM      MDM   Final diagnoses:  Chest pain, unspecified chest pain type  URI (upper respiratory infection)   60 year old female with a history of COPD, hyperlipidemia, CHF, fibromyalgia presents with concern for chest pain. Patient reports developing URI symptoms over the last week and had been given doxycycline, with improvement in dyspnea and wheezing, however developed chest pain with exertion yesterday. EKG shows incomplete right bundle branch block, no ST changes.  Chest x-ray showed unusual left hilar prominence, without other clear findings of pneumonia or heart failure. Given patient's abnormal x-ray as well as history of 14 Hour drive one week ago with pulmonary embolus in differential, CTA was ordered which showed no sign of PE, and mildly prominent mediastinal lymph nodes.  No significant wheezing and doubt acute COPD exacerbation. Delta troponins are negative. Discussed in detail with patient  desire for admission given concern for exertional chest pain with high risk hear score, however patient declines admission and would like to follow up with her cardiologist tomorrow. Patient understands the risks of death and disability and wishes for discharge. Patient discharged in stable condition with understanding of reasons to return.   Gareth Morgan, MD 01/08/15 0201

## 2015-01-08 ENCOUNTER — Encounter: Payer: Self-pay | Admitting: Unknown Physician Specialty

## 2015-01-08 LAB — I-STAT TROPONIN, ED: Troponin i, poc: 0 ng/mL (ref 0.00–0.08)

## 2015-01-08 NOTE — Progress Notes (Signed)
Quick Note:  Normal labs. Patient notified by letter. ______ 

## 2015-03-11 ENCOUNTER — Other Ambulatory Visit: Payer: Self-pay | Admitting: Family Medicine

## 2015-03-11 ENCOUNTER — Other Ambulatory Visit: Payer: Self-pay | Admitting: Unknown Physician Specialty

## 2015-04-12 ENCOUNTER — Other Ambulatory Visit: Payer: Self-pay | Admitting: Unknown Physician Specialty

## 2015-04-25 ENCOUNTER — Encounter: Payer: BLUE CROSS/BLUE SHIELD | Admitting: Unknown Physician Specialty

## 2015-05-17 ENCOUNTER — Other Ambulatory Visit: Payer: Self-pay | Admitting: Family Medicine

## 2015-05-17 NOTE — Telephone Encounter (Signed)
Your patient 

## 2015-05-22 ENCOUNTER — Emergency Department: Payer: BLUE CROSS/BLUE SHIELD

## 2015-05-22 ENCOUNTER — Emergency Department
Admission: EM | Admit: 2015-05-22 | Discharge: 2015-05-22 | Disposition: A | Discharge status: Home / Self Care | Payer: BLUE CROSS/BLUE SHIELD | Attending: Emergency Medicine | Admitting: Emergency Medicine

## 2015-05-22 ENCOUNTER — Encounter: Payer: Self-pay | Admitting: *Deleted

## 2015-05-22 DIAGNOSIS — M25571 Pain in right ankle and joints of right foot: Secondary | ICD-10-CM

## 2015-05-22 DIAGNOSIS — J449 Chronic obstructive pulmonary disease, unspecified: Secondary | ICD-10-CM | POA: Diagnosis not present

## 2015-05-22 DIAGNOSIS — F17210 Nicotine dependence, cigarettes, uncomplicated: Secondary | ICD-10-CM | POA: Diagnosis not present

## 2015-05-22 DIAGNOSIS — F329 Major depressive disorder, single episode, unspecified: Secondary | ICD-10-CM | POA: Insufficient documentation

## 2015-05-22 DIAGNOSIS — M25471 Effusion, right ankle: Secondary | ICD-10-CM | POA: Insufficient documentation

## 2015-05-22 DIAGNOSIS — Z7982 Long term (current) use of aspirin: Secondary | ICD-10-CM | POA: Diagnosis not present

## 2015-05-22 DIAGNOSIS — E785 Hyperlipidemia, unspecified: Secondary | ICD-10-CM | POA: Diagnosis not present

## 2015-05-22 DIAGNOSIS — N189 Chronic kidney disease, unspecified: Secondary | ICD-10-CM | POA: Diagnosis not present

## 2015-05-22 DIAGNOSIS — I509 Heart failure, unspecified: Secondary | ICD-10-CM | POA: Diagnosis not present

## 2015-05-22 DIAGNOSIS — Z7902 Long term (current) use of antithrombotics/antiplatelets: Secondary | ICD-10-CM | POA: Insufficient documentation

## 2015-05-22 DIAGNOSIS — Z91013 Allergy to seafood: Secondary | ICD-10-CM | POA: Diagnosis not present

## 2015-05-22 DIAGNOSIS — E669 Obesity, unspecified: Secondary | ICD-10-CM | POA: Diagnosis not present

## 2015-05-22 DIAGNOSIS — Z79899 Other long term (current) drug therapy: Secondary | ICD-10-CM | POA: Insufficient documentation

## 2015-05-22 DIAGNOSIS — I130 Hypertensive heart and chronic kidney disease with heart failure and stage 1 through stage 4 chronic kidney disease, or unspecified chronic kidney disease: Secondary | ICD-10-CM | POA: Insufficient documentation

## 2015-05-22 LAB — BASIC METABOLIC PANEL
ANION GAP: 9 (ref 5–15)
BUN: 11 mg/dL (ref 6–20)
CHLORIDE: 103 mmol/L (ref 101–111)
CO2: 27 mmol/L (ref 22–32)
Calcium: 8.9 mg/dL (ref 8.9–10.3)
Creatinine, Ser: 0.75 mg/dL (ref 0.44–1.00)
GFR calc non Af Amer: >60 mL/min (ref >60)
Glucose, Bld: 100 mg/dL — ABNORMAL HIGH (ref 65–99)
POTASSIUM: 4.1 mmol/L (ref 3.5–5.1)
SODIUM: 139 mmol/L (ref 135–145)

## 2015-05-22 LAB — CBC
HCT: 43.1 % (ref 35.0–47.0)
HEMOGLOBIN: 14.7 g/dL (ref 12.0–16.0)
MCH: 30.2 pg (ref 26.0–34.0)
MCHC: 34.2 g/dL (ref 32.0–36.0)
MCV: 88.4 fL (ref 80.0–100.0)
Platelets: 124 10*3/uL — ABNORMAL LOW (ref 150–440)
RBC: 4.88 MIL/uL (ref 3.80–5.20)
RDW: 15 % — AB (ref 11.5–14.5)
WBC: 6.9 10*3/uL (ref 3.6–11.0)

## 2015-05-22 LAB — URIC ACID: URIC ACID, SERUM: 5 mg/dL (ref 2.3–6.6)

## 2015-05-22 NOTE — ED Notes (Signed)
Dr. Burlene Arnt discussed with patient the importance of making sure she did not have an infection in her ankle and patient states, "I just want to go home, I don't think there's anything wrong."  Patient's daughter states they would continue to monitor patient at home.  Patient and family left prior to this RN getting a final set of vital signs and prior to signing out AMA.

## 2015-05-22 NOTE — ED Notes (Signed)
States right ankle pain and swelling that began several days ago, states pain increases when walking and shooting up her calf, denies any long travels recently, states hx of vascular disease

## 2015-05-22 NOTE — ED Notes (Signed)
Pt requesting to leave AMA, Dr Burlene Arnt aware and pt to sign.

## 2015-05-22 NOTE — ED Provider Notes (Addendum)
[facility] Emergency Department Provider Note  ____________________________________________   I have reviewed the triage vital signs and the nursing notes.   HISTORY  Chief Complaint Ankle Pain    HPI Brandi Steele is a 60 y.o. female , with a history offibromyalgia, COPD, depression and anxiety sciatica CHF hypertension presents today complaining of pain in her right ankle which is been there for 2 days. No trauma. Hurts to walk on she can barely bear weight. It was read to her. She has not had any systemic symptoms, she has had no fevers or chills. She denies history of gout, she has not had any injury. She does have a history of peripheral vascular disease and aortic stenosis.  Family is insistent that this is likely a blood clot, although she has no history of blood clots and the pain is localized to the ankle only. The daughter is somewhat confrontational and apparently has some medical training.    Past Medical History  Diagnosis Date  . Aortic stenosis   . GERD (gastroesophageal reflux disease)   . Obesity   . COPD (chronic obstructive pulmonary disease) ([place])   . Fibromyalgia   . Hypertension   . Hyperlipidemia   . Stress incontinence   . Depression   . Anxiety   . Sciatica   . CHF (congestive heart failure) ([place])   . Myalgia   . Seborrheic keratosis   . Hypertensive heart disease     Patient Active Problem List   Diagnosis Date Noted  . Aortic stenosis 01/05/2015  . Obesity 01/05/2015  . COPD (chronic obstructive pulmonary disease) ([place]) 01/05/2015  . Fibromyalgia 01/05/2015  . Hyperlipidemia 01/05/2015  . Stress 01/05/2015  . GERD (gastroesophageal reflux disease) 01/05/2015  . Sciatica 01/05/2015  . CHF (congestive heart failure) ([place]) 01/05/2015  . Seborrheic keratoses 01/05/2015  . Hypertensive heart disease 01/05/2015  . Hypertensive CKD (chronic kidney disease) 01/05/2015  . Insomnia 01/05/2015    Past Surgical History   Procedure Laterality Date  . Abdominal hysterectomy      partial    Current Outpatient Rx  Name  Route  Sig  Dispense  Refill  . albuterol (PROVENTIL HFA;VENTOLIN HFA) 108 (90 BASE) MCG/ACT inhaler   Inhalation   Inhale 2 puffs into the lungs every 6 (six) hours as needed for wheezing or shortness of breath.   1 Inhaler   1   . aspirin EC 81 MG tablet   Oral   Take 81 mg by mouth every evening.         [name] atorvastatin (LIPITOR) 40 MG tablet   Oral   Take 20 mg by mouth every evening.          . clopidogrel (PLAVIX) 75 MG tablet   Oral   Take 75 mg by mouth every evening.          . cyclobenzaprine (FLEXERIL) 10 MG tablet   Oral   Take 10 mg by mouth 3 (three) times daily as needed for muscle spasms (for firbromyalgia.).          [name] doxycycline (VIBRA-TABS) 100 MG tablet   Oral   Take 1 tablet (100 mg total) by mouth 2 (two) times daily.   20 tablet   0   . esomeprazole (NEXIUM) 40 MG capsule   Oral   Take 40 mg by mouth every evening.          . furosemide (LASIX) 40 MG tablet      TAKE  1 TABLET BY MOUTH DAILY   30 tablet   0   . gabapentin (NEURONTIN) 600 MG tablet   Oral   Take 600 mg by mouth every evening.          [name] lisinopril (PRINIVIL,ZESTRIL) 2.5 MG tablet      TAKE 1 TABLET BY MOUTH DAILY   30 tablet   2   . Magnesium Citrate 100 MG TABS   Oral   Take 200 mg by mouth 2 (two) times daily.          . Potassium Gluconate 550 MG TABS   Oral   Take 1,650 mg by mouth daily.         [name] zolpidem (AMBIEN) 10 MG tablet   Oral   Take 10 mg by mouth at bedtime as needed for sleep.           Allergies Morphine and related; Celexa; Effexor; Shellfish allergy; and Wellbutrin  Family History  Problem Relation Age of Onset  . Stroke Mother   . Hypertension Mother   . Heart disease Father   . Diabetes Brother   . Stroke Maternal Grandmother   . Stroke Maternal Grandfather   . Heart disease Paternal Grandmother   . Heart disease  Paternal Grandfather     Social History Social History  Substance Use Topics  . Smoking status: Current Every Day Smoker -- 0.50 packs/day    Types: Cigarettes  . Smokeless tobacco: Never Used  . Alcohol Use: No    Review of Systems Constitutional: No fever/chills Eyes: No visual changes. ENT: No sore throat. No stiff neck no neck pain Cardiovascular: Denies chest pain. Respiratory: Denies shortness of breath. Gastrointestinal:   no vomiting.  No diarrhea.  No constipation. Genitourinary: Negative for dysuria. Musculoskeletal: Negative lower extremity swelling Skin: Negative for rash. Neurological: Negative for headaches, focal weakness or numbness. 10-point ROS otherwise negative.  ____________________________________________   PHYSICAL EXAM:  VITAL SIGNS: ED Triage Vitals  Enc Vitals Group     BP 05/22/15 1206 120/78 mmHg     Pulse Rate 05/22/15 1206 77     Resp 05/22/15 1206 18     Temp 05/22/15 1206 98.7 F (37.1 C)     Temp Source 05/22/15 1206 Oral     SpO2 05/22/15 1206 96 %     Weight 05/22/15 1206 190 lb (86.183 kg)     Height 05/22/15 1206 5\' 6"  (1.676 m)     Head Cir --      Peak Flow --      Pain Score 05/22/15 1207 5     Pain Loc --      Pain Edu? --      Excl. in [place]? --     Constitutional: Alert and oriented. Well appearing and in no acute distress. Eyes: Conjunctivae are normal. PERRL. EOMI. Head: Atraumatic. Nose: No congestion/rhinnorhea. Mouth/Throat: Mucous membranes are moist.  Oropharynx non-erythematous. Neck: No stridor.   Nontender with no meningismus Cardiovascular: Normal rate, regular rhythm. Grossly normal heart sounds.  Good peripheral circulation. Respiratory: Normal respiratory effort.  No retractions. Lungs CTAB. Abdominal: Soft and nontender. No distention. No guarding no rebound Back:  There is no focal tenderness or step off there is no midline tenderness there are no lesions noted. there is no CVA  tenderness Musculoskeletal: There is tenderness and swelling to the lateral and to some extent medial aspect of the right ankle with pain with ranging of the ankle. It is warm to touch,  and there is mild erythema noted. No other symptoms are noted, she has strong distal pulses, the foot is warm and well perfused, the compartments are soft, isolated redness and pain around the ankle joint especially on the lateral aspect. strong distal pulses no edema Neurologic:  Normal speech and language. No gross focal neurologic deficits are appreciated.  Skin:  Skin is warm, dry and intact. No rash noted. Psychiatric: Mood and affect are normal. Speech and behavior are normal.  ____________________________________________   LABS (all labs ordered are listed, but only abnormal results are displayed)  Labs Reviewed  CBC - Abnormal; Notable for the following:    RDW 15.0 (*)    Platelets 124 (*)    All other components within normal limits  BASIC METABOLIC PANEL - Abnormal; Notable for the following:    Glucose, Bld 100 (*)    All other components within normal limits  URIC ACID   ____________________________________________  EKG  I personally interpreted any EKGs ordered by me or triage  ____________________________________________  RADIOLOGY  I reviewed any imaging ordered by me or triage that were performed during my shift and, if possible, patient and/or family made aware of any abnormal findings. ____________________________________________   PROCEDURES  Procedure(s) performed: None  Critical Care performed: None  ____________________________________________   INITIAL IMPRESSION / ASSESSMENT AND PLAN / ED COURSE  Pertinent labs & imaging results that were available during my care of the patient were reviewed by me and considered in my medical decision making (see chart for details).  Patient with a warm and well-perfused foot with strong pulses with isolated pain and swelling  around the ankle. Certainly could be gout however patient does not have a history of gout, x-ray does show some joint effusion. White count is reassuring. I paged orthopedic surgery for input on this case. Patient has been refusing and continues to refuse any pain medication  ----------------------------------------- 5:32 PM on 05/22/2015 -----------------------------------------  Patient and family want to go home. I have advised him to wait. I've advised them that without orthopedic input we cannot rule out joint infection which could have significant morbidity or even mortality. The family's daughter is here and they state that it "as long as her white count is okay with it is okay for [place] to follow up". I've advised and the white count is certainly not sufficient to rule out an infected joint and it still is a concern.  Nonetheless, orthopedic surgery is actually in the operating room and cannot come down immediately. Family is adamant that they do not wish to wait. I'll given them extensive return precautions and follow-up. The family states that they are "medically knowledgeable" and that they will sign out AMA because they are anxious to go home. I have explained to them they can return at any time if they wish for further workup or evaluation.  ----------------------------------------- 5:35 PM on 05/22/2015 -----------------------------------------  Family at length prior to official [place] paperwork, and again I did my best to explain to them that there is a chance that she could even have  joint amputation or other significant bad outcome if they leave. ____________________________________________   FINAL CLINICAL IMPRESSION(S) / ED DIAGNOSES  Final diagnoses:  None      This chart was dictated using voice recognition software.  Despite best efforts to proofread,  errors can occur which can change meaning.     Schuyler Amor, MD 05/22/15 [place], MD 05/22/15  [place],  MD 05/22/15 [place], MD 05/22/15 [phone]

## 2015-05-22 NOTE — ED Notes (Signed)
Pt left prior to signing AMA. MD notified.

## 2015-05-23 ENCOUNTER — Telehealth: Payer: Self-pay | Admitting: Unknown Physician Specialty

## 2015-05-23 NOTE — Telephone Encounter (Signed)
Patient called stating that she went to the ER and they couldn't find anything wrong with her but she is still in pain. Brandi Steele doesn't have anything available therefore she said she needs to talk to either CMA or Brandi Steele about it.

## 2015-05-23 NOTE — Telephone Encounter (Signed)
I see she left AMA from the ER.  I am not comfortable treating this without seeing it.

## 2015-05-23 NOTE — Telephone Encounter (Signed)
Called and spoke to patient. She said that she went to ER yesterday because her daughter is a Marine scientist and she suggested a doppler to check for a blood clot given her past history. Patient states they found out she did not have a blood clot but that she did have fluid in the ankle joint. She says that they mentioned that she may have gout or an infection. She stated they checked her uric acid and it was fine. Patient states she has been in extreme pain and can hardly move her ankle or foot. She has a physical scheduled on Monday with [place] but wants to know if she should have labs done before then to see what's wrong with her ankle or what kind of advice Malachy Mood could give to her.

## 2015-05-23 NOTE — Telephone Encounter (Signed)
Called and spoke to patient. She has appointment with [place] Monday.

## 2015-05-28 ENCOUNTER — Ambulatory Visit (INDEPENDENT_AMBULATORY_CARE_PROVIDER_SITE_OTHER): Payer: BLUE CROSS/BLUE SHIELD | Admitting: Unknown Physician Specialty

## 2015-05-28 ENCOUNTER — Encounter: Payer: Self-pay | Admitting: Unknown Physician Specialty

## 2015-05-28 VITALS — BP 132/71 | HR 93 | Temp 98.1°F | Ht 65.2 in | Wt 190.8 lb

## 2015-05-28 DIAGNOSIS — Z23 Encounter for immunization: Secondary | ICD-10-CM | POA: Diagnosis not present

## 2015-05-28 DIAGNOSIS — I10 Essential (primary) hypertension: Secondary | ICD-10-CM | POA: Diagnosis not present

## 2015-05-28 DIAGNOSIS — Z0000 Encounter for general adult medical examination without abnormal findings: Secondary | ICD-10-CM | POA: Diagnosis not present

## 2015-05-28 DIAGNOSIS — E785 Hyperlipidemia, unspecified: Secondary | ICD-10-CM | POA: Diagnosis not present

## 2015-05-28 DIAGNOSIS — M7751 Other enthesopathy of right foot: Secondary | ICD-10-CM

## 2015-05-28 DIAGNOSIS — M6588 Other synovitis and tenosynovitis, other site: Secondary | ICD-10-CM | POA: Diagnosis not present

## 2015-05-28 LAB — MICROALBUMIN, URINE WAIVED
Creatinine, Urine Waived: 200 mg/dL (ref 10–300)
Microalb, Ur Waived: 30 mg/L — ABNORMAL HIGH (ref 0–19)

## 2015-05-28 NOTE — Assessment & Plan Note (Signed)
Stable, continue present medications.   

## 2015-05-28 NOTE — Patient Instructions (Addendum)
.cwm  Please do call to schedule your mammogram; the number to schedule one at either [facility] or [facility] is [phone]  Tdap Vaccine (Tetanus, Diphtheria and Pertussis): What You Need to Know 1. Why get vaccinated? Tetanus, diphtheria and pertussis are very serious diseases. Tdap vaccine can protect [place] from these diseases. And, Tdap vaccine given to pregnant women can protect newborn babies against pertussis. TETANUS (Lockjaw) is rare in the [place] today. It causes painful muscle tightening and stiffness, usually all over the body.  It can lead to tightening of muscles in the head and neck so you can't open your mouth, swallow, or sometimes even breathe. Tetanus kills about 1 out of 10 people who are infected even after receiving the best medical care. DIPHTHERIA is also rare in the [place] today. It can cause a thick coating to form in the back of the throat.  It can lead to breathing problems, heart failure, paralysis, and death. PERTUSSIS (Whooping Cough) causes severe coughing spells, which can cause difficulty breathing, vomiting and disturbed sleep.  It can also lead to weight loss, incontinence, and rib fractures. Up to 2 in 100 adolescents and 5 in 100 adults with pertussis are hospitalized or have complications, which could include pneumonia or death. These diseases are caused by bacteria. Diphtheria and pertussis are spread from person to person through secretions from coughing or sneezing. Tetanus enters the body through cuts, scratches, or wounds. Before vaccines, as many as 200,000 cases of diphtheria, 200,000 cases of pertussis, and hundreds of cases of tetanus, were reported in the [place] each year. Since vaccination began, reports of cases for tetanus and diphtheria have dropped by about 99% and for pertussis by about 80%. 2. Tdap vaccine Tdap vaccine can protect adolescents and adults from tetanus, diphtheria, and  pertussis. One dose of Tdap is routinely given at age 67 or 56. People who did not get Tdap at that age should get it as soon as possible. Tdap is especially important for healthcare professionals and anyone having close contact with a baby younger than 12 months. Pregnant women should get a dose of Tdap during every pregnancy, to protect the newborn from pertussis. Infants are most at risk for severe, life-threatening complications from pertussis. Another vaccine, called Td, protects against tetanus and diphtheria, but not pertussis. A Td booster should be given every 10 years. Tdap may be given as one of these boosters if you have never gotten Tdap before. Tdap may also be given after a severe cut or burn to prevent tetanus infection. Your doctor or the person giving you the vaccine can give you more information. Tdap may safely be given at the same time as other vaccines. 3. Some people should not get this vaccine  A person who has ever had a life-threatening allergic reaction after a previous dose of any diphtheria, tetanus or pertussis containing vaccine, OR has a severe allergy to any part of this vaccine, should not get Tdap vaccine. Tell the person giving the vaccine about any severe allergies.  Anyone who had coma or long repeated seizures within 7 days after a childhood dose of DTP or DTaP, or a previous dose of Tdap, should not get Tdap, unless a cause other than the vaccine was found. They can still get Td.  Talk to your doctor if you:  have seizures or another nervous system problem,  had severe pain or swelling after any vaccine containing diphtheria, tetanus or pertussis,  ever  had a condition called Guillain-Barr Syndrome (GBS),  aren't feeling well on the day the shot is scheduled. 4. Risks With any medicine, including vaccines, there is a chance of side effects. These are usually mild and go away on their own. Serious reactions are also possible but are rare. Most people who  get Tdap vaccine do not have any problems with it. Mild problems following Tdap (Did not interfere with activities)  Pain where the shot was given (about 3 in 4 adolescents or 2 in 3 adults)  Redness or swelling where the shot was given (about 1 person in 5)  Mild fever of at least 100.102F (up to about 1 in 25 adolescents or 1 in 100 adults)  Headache (about 3 or 4 people in 10)  Tiredness (about 1 person in 3 or 4)  Nausea, vomiting, diarrhea, stomach ache (up to 1 in 4 adolescents or 1 in 10 adults)  Chills, sore joints (about 1 person in 10)  Body aches (about 1 person in 3 or 4)  Rash, swollen glands (uncommon) Moderate problems following Tdap (Interfered with activities, but did not require medical attention)  Pain where the shot was given (up to 1 in 5 or 6)  Redness or swelling where the shot was given (up to about 1 in 16 adolescents or 1 in 12 adults)  Fever over 102F (about 1 in 100 adolescents or 1 in 250 adults)  Headache (about 1 in 7 adolescents or 1 in 10 adults)  Nausea, vomiting, diarrhea, stomach ache (up to 1 or 3 people in 100)  Swelling of the entire arm where the shot was given (up to about 1 in 500). Severe problems following Tdap (Unable to perform usual activities; required medical attention)  Swelling, severe pain, bleeding and redness in the arm where the shot was given (rare). Problems that could happen after any vaccine:  People sometimes faint after a medical procedure, including vaccination. Sitting or lying down for about 15 minutes can help prevent fainting, and injuries caused by a fall. Tell your doctor if you feel dizzy, or have vision changes or ringing in the ears.  Some people get severe pain in the shoulder and have difficulty moving the arm where a shot was given. This happens very rarely.  Any medication can cause a severe allergic reaction. Such reactions from a vaccine are very rare, estimated at fewer than 1 in a million  doses, and would happen within a few minutes to a few hours after the vaccination. As with any medicine, there is a very remote chance of a vaccine causing a serious injury or death. The safety of vaccines is always being monitored. For more information, visit: http://www.aguilar.org/ 5. What if there is a serious problem? What should I look for?  Look for anything that concerns you, such as signs of a severe allergic reaction, very high fever, or unusual behavior.  Signs of a severe allergic reaction can include hives, swelling of the face and throat, difficulty breathing, a fast heartbeat, dizziness, and weakness. These would usually start a few minutes to a few hours after the vaccination. What should I do?  If you think it is a severe allergic reaction or other emergency that can't wait, call 9-1-1 or get the person to the nearest hospital. Otherwise, call your doctor.  Afterward, the reaction should be reported to the Vaccine Adverse Event Reporting System (VAERS). Your doctor might file this report, or you can do it yourself through the VAERS web  site at www.vaers.SamedayNews.es, or by calling 613 076 6510. VAERS does not give medical advice.  6. The National Vaccine Injury Compensation Program The [employer] Vaccine Injury Compensation Program (VICP) is a federal program that was created to compensate people who may have been injured by certain vaccines. Persons who believe they may have been injured by a vaccine can learn about the program and about filing a claim by calling 437-572-8468 or visiting the [place] website at GoldCloset.com.ee. There is a time limit to file a claim for compensation. 7. How can I learn more?  Ask your doctor. He or she can give you the vaccine package insert or suggest other sources of information.  Call your local or state health department.  Contact the Centers for Disease Control and Prevention (CDC):  Call 914-308-6294 (1-800-CDC-INFO)  or  Visit CDC's website at http://hunter.com/ CDC Tdap Vaccine VIS (03/22/13)   This information is not intended to replace advice given to you by your health care provider. Make sure you discuss any questions you have with your health care provider.   Document Released: 07/15/2011 Document Revised: 02/03/2014 Document Reviewed: 04/27/2013 Elsevier Interactive Patient Education [employer].

## 2015-05-28 NOTE — Assessment & Plan Note (Signed)
I suspect ankle pain related to tendonitis.  Will refer to podiatry

## 2015-05-28 NOTE — Progress Notes (Signed)
BP 132/71 mmHg  Pulse 93  Temp(Src) 98.1 F (36.7 C)  Ht 5' 5.2" (1.656 m)  Wt 190 lb 12.8 oz (86.546 kg)  BMI 31.56 kg/m2  SpO2 90%  LMP  (LMP Unknown)   Subjective:    Patient ID: Brandi Steele, female    DOB: [birth year], 60 y.o.   MRN: [id number]  HPI: Brandi Steele is a 60 y.o. female  Chief Complaint  Patient presents with  . Annual Exam    HIV and Hep C orders placed  . Ankle Pain    pt states her right ankle has been hurting her since last Sunday, went to ER for it   Depression screen [facility] 2/9 05/28/2015 05/28/2015  Decreased Interest 0 0  Down, Depressed, Hopeless 1 0  PHQ - 2 Score 1 0     Ankle pain Pt with right ankle pain which is improving.  She went to the ER and rule negative for blood clot.  Went AMA from the ER before orthopedic consult could be obtained.  Determined cellulitis vs gout.  Uric acid levels WNL.    Hypertension Using medications without difficulty Average home BPs: not checking  No problems or lightheadedness No chest pain with exertion or shortness of breath No Edema   Hyperlipidemia Using medications without problems No Muscle aches  Diet compliance: States it is improving Exercise: None other than sedentary stretching   Relevant past medical, surgical, family and social history reviewed and updated as indicated. Interim medical history since our last visit reviewed. Allergies and medications reviewed and updated.  Review of Systems  Constitutional: Negative.   HENT: Negative.   Eyes: Negative.   Respiratory: Negative.   Cardiovascular: Negative.   Gastrointestinal: Negative.   Endocrine: Negative.   Genitourinary: Negative.   Skin: Negative.   Psychiatric/Behavioral: Negative.        Mood "off and on."  Having a lot of stress with grown children    Per HPI unless specifically indicated above     Objective:    BP 132/71 mmHg  Pulse 93  Temp(Src) 98.1 F (36.7 C)  Ht 5' 5.2" (1.656 m)  Wt 190 lb 12.8 oz  (86.546 kg)  BMI 31.56 kg/m2  SpO2 90%  LMP  (LMP Unknown)  Wt Readings from Last 3 Encounters:  05/28/15 190 lb 12.8 oz (86.546 kg)  05/22/15 190 lb (86.183 kg)  01/07/15 198 lb (89.812 kg)    Physical Exam  Constitutional: She is oriented to person, place, and time. She appears well-developed and well-nourished.  HENT:  Head: Normocephalic and atraumatic.  Eyes: Pupils are equal, round, and reactive to light. Right eye exhibits no discharge. Left eye exhibits no discharge. No scleral icterus.  Neck: Normal range of motion. Neck supple. Carotid bruit is not present. No thyromegaly present.  Cardiovascular: Normal rate, regular rhythm and normal heart sounds.  Exam reveals no gallop and no friction rub.   No murmur heard. Pulmonary/Chest: Effort normal and breath sounds normal. No respiratory distress. She has no wheezes. She has no rales.  Abdominal: Soft. Bowel sounds are normal. There is no tenderness. There is no rebound.  Genitourinary: No breast swelling, tenderness or discharge.  Musculoskeletal: Normal range of motion.       Right ankle: She exhibits swelling. Tenderness. Lateral malleolus tenderness found. Achilles tendon exhibits pain.  Lymphadenopathy:    She has no cervical adenopathy.  Neurological: She is alert and oriented to person, place, and time.  Skin: Skin is  warm, dry and intact. No rash noted.  Psychiatric: She has a normal mood and affect. Her speech is normal and behavior is normal. Judgment and thought content normal. Cognition and memory are normal.  Vitals reviewed.     Assessment & Plan:   Problem List Items Addressed This Visit      Unprioritized   Essential hypertension    Stable, continue present medications.        Relevant Orders   Comprehensive metabolic panel   Microalbumin, Urine Waived   Uric acid   Hyperlipidemia   Relevant Orders   Lipid Panel w/o Chol/HDL Ratio   Tendonitis of ankle, right    I suspect ankle pain related to  tendonitis.  Will refer to podiatry       Other Visit Diagnoses    Need for diphtheria-tetanus-pertussis (Tdap) vaccine, adult/adolescent    -  Primary    Relevant Orders    Tdap vaccine greater than or equal to 7yo IM (Completed)    Health care maintenance        Relevant Orders    HIV antibody    Hepatitis C antibody    MM DIGITAL SCREENING BILATERAL    CBC with Differential/Platelet    TSH        Follow up plan: Return in about 6 months (around 11/28/2015).

## 2015-05-29 ENCOUNTER — Encounter: Payer: Self-pay | Admitting: Unknown Physician Specialty

## 2015-05-29 LAB — CBC WITH DIFFERENTIAL/PLATELET
BASOS ABS: 0 10*3/uL (ref 0.0–0.2)
Basos: 0 %
EOS (ABSOLUTE): 0.1 10*3/uL (ref 0.0–0.4)
EOS: 2 %
HEMATOCRIT: 45.4 % (ref 34.0–46.6)
HEMOGLOBIN: 14.9 g/dL (ref 11.1–15.9)
IMMATURE GRANS (ABS): 0 10*3/uL (ref 0.0–0.1)
Immature Granulocytes: 0 %
LYMPHS: 22 %
Lymphocytes Absolute: 1.5 10*3/uL (ref 0.7–3.1)
MCH: 30.1 pg (ref 26.6–33.0)
MCHC: 32.8 g/dL (ref 31.5–35.7)
MCV: 92 fL (ref 79–97)
MONOCYTES: 7 %
Monocytes Absolute: 0.5 10*3/uL (ref 0.1–0.9)
NEUTROS ABS: 4.8 10*3/uL (ref 1.4–7.0)
Neutrophils: 69 %
Platelets: 147 10*3/uL — ABNORMAL LOW (ref 150–379)
RBC: 4.95 x10E6/uL (ref 3.77–5.28)
RDW: 14.7 % (ref 12.3–15.4)
WBC: 6.9 10*3/uL (ref 3.4–10.8)

## 2015-05-29 LAB — LIPID PANEL W/O CHOL/HDL RATIO
CHOLESTEROL TOTAL: 132 mg/dL (ref 100–199)
HDL: 34 mg/dL — AB (ref >39)
LDL Calculated: 70 mg/dL (ref 0–99)
Triglycerides: 138 mg/dL (ref 0–149)
VLDL CHOLESTEROL CAL: 28 mg/dL (ref 5–40)

## 2015-05-29 LAB — COMPREHENSIVE METABOLIC PANEL
A/G RATIO: 2.1 (ref 1.2–2.2)
ALK PHOS: 81 IU/L (ref 39–117)
ALT: 17 IU/L (ref 0–32)
AST: 16 IU/L (ref 0–40)
Albumin: 4.5 g/dL (ref 3.6–4.8)
BUN/Creatinine Ratio: 10 — ABNORMAL LOW (ref 12–28)
BUN: 7 mg/dL — AB (ref 8–27)
Bilirubin Total: 0.3 mg/dL (ref 0.0–1.2)
CALCIUM: 9 mg/dL (ref 8.7–10.3)
CHLORIDE: 101 mmol/L (ref 96–106)
CO2: 28 mmol/L (ref 18–29)
CREATININE: 0.71 mg/dL (ref 0.57–1.00)
GFR, EST AFRICAN AMERICAN: 106 mL/min/{1.73_m2} (ref >59)
GFR, EST NON AFRICAN AMERICAN: 92 mL/min/{1.73_m2} (ref >59)
GLOBULIN, TOTAL: 2.1 g/dL (ref 1.5–4.5)
Glucose: 96 mg/dL (ref 65–99)
Potassium: 3.9 mmol/L (ref 3.5–5.2)
Sodium: 145 mmol/L — ABNORMAL HIGH (ref 134–144)
Total Protein: 6.6 g/dL (ref 6.0–8.5)

## 2015-05-29 LAB — HEPATITIS C ANTIBODY

## 2015-05-29 LAB — HIV ANTIBODY (ROUTINE TESTING W REFLEX): HIV SCREEN 4TH GENERATION: NONREACTIVE

## 2015-05-29 LAB — URIC ACID: Uric Acid: 5.1 mg/dL (ref 2.5–7.1)

## 2015-05-29 LAB — TSH: TSH: 0.713 u[IU]/mL (ref 0.450–4.500)

## 2015-05-29 NOTE — Progress Notes (Signed)
Quick Note:  Normal labs. Patient notified by letter. ______ 

## 2015-05-31 ENCOUNTER — Telehealth: Payer: Self-pay | Admitting: Unknown Physician Specialty

## 2015-05-31 NOTE — Telephone Encounter (Signed)
I did not

## 2015-05-31 NOTE — Telephone Encounter (Signed)
Pt returned call

## 2015-05-31 NOTE — Telephone Encounter (Signed)
Routing to provider. Malachy Mood I did not call this patient, did you?

## 2015-05-31 NOTE — Telephone Encounter (Signed)
Called and let patient know that neither I nor Malachy Mood call her.

## 2015-06-20 ENCOUNTER — Other Ambulatory Visit: Payer: Self-pay | Admitting: Unknown Physician Specialty

## 2015-06-20 ENCOUNTER — Other Ambulatory Visit: Payer: Self-pay | Admitting: Family Medicine

## 2015-06-20 NOTE — Telephone Encounter (Signed)
rx

## 2015-06-21 ENCOUNTER — Telehealth: Payer: Self-pay | Admitting: Unknown Physician Specialty

## 2015-06-21 NOTE — Telephone Encounter (Signed)
Benika from [place] prior authorization called stating that she needs to talk to you about a prior authorization you send for patient. She said you can call her at [phone] reference number [id number].

## 2015-06-22 ENCOUNTER — Other Ambulatory Visit: Payer: Self-pay

## 2015-06-22 MED ORDER — LISINOPRIL 2.5 MG PO TABS: 2.5000 mg | ORAL_TABLET | Freq: Every day | ORAL | Status: DC

## 2015-06-22 NOTE — Telephone Encounter (Signed)
Patient was seen 05/28/15.

## 2015-08-04 ENCOUNTER — Other Ambulatory Visit: Payer: Self-pay | Admitting: Unknown Physician Specialty

## 2015-09-06 ENCOUNTER — Ambulatory Visit
Admission: RE | Admit: 2015-09-06 | Discharge: 2015-09-06 | Disposition: A | Discharge status: Home / Self Care | Payer: BLUE CROSS/BLUE SHIELD | Source: Ambulatory Visit | Attending: Unknown Physician Specialty | Admitting: Unknown Physician Specialty

## 2015-09-06 ENCOUNTER — Encounter: Payer: Self-pay | Admitting: Family Medicine

## 2015-09-06 ENCOUNTER — Other Ambulatory Visit: Payer: Self-pay | Admitting: Unknown Physician Specialty

## 2015-09-06 DIAGNOSIS — Z0000 Encounter for general adult medical examination without abnormal findings: Secondary | ICD-10-CM | POA: Diagnosis not present

## 2015-09-06 DIAGNOSIS — Z1231 Encounter for screening mammogram for malignant neoplasm of breast: Secondary | ICD-10-CM | POA: Diagnosis present

## 2015-09-12 ENCOUNTER — Encounter: Payer: Self-pay | Admitting: Unknown Physician Specialty

## 2015-09-12 ENCOUNTER — Ambulatory Visit (INDEPENDENT_AMBULATORY_CARE_PROVIDER_SITE_OTHER): Payer: BLUE CROSS/BLUE SHIELD | Admitting: Unknown Physician Specialty

## 2015-09-12 VITALS — BP 109/69 | HR 75 | Temp 98.5°F | Wt 189.4 lb

## 2015-09-12 DIAGNOSIS — R05 Cough: Secondary | ICD-10-CM

## 2015-09-12 DIAGNOSIS — J42 Unspecified chronic bronchitis: Secondary | ICD-10-CM

## 2015-09-12 DIAGNOSIS — R059 Cough, unspecified: Secondary | ICD-10-CM

## 2015-09-12 DIAGNOSIS — J209 Acute bronchitis, unspecified: Secondary | ICD-10-CM | POA: Diagnosis not present

## 2015-09-12 LAB — VERITOR FLU A/B WAIVED
INFLUENZA A: NEGATIVE
INFLUENZA B: NEGATIVE

## 2015-09-12 MED ORDER — DOXYCYCLINE HYCLATE 100 MG PO TABS: 100.0000 mg | ORAL_TABLET | Freq: Two times a day (BID) | ORAL | 0 refills | Status: DC

## 2015-09-12 NOTE — Progress Notes (Signed)
BP 109/69 (BP Location: Left Arm, Patient Position: Sitting, Cuff Size: Large)   Pulse 75   Temp 98.5 F (36.9 C)   Wt 189 lb 6.4 oz (85.9 kg)   LMP  (LMP Unknown)   SpO2 (!) 89%   BMI 31.32 kg/m    Subjective:    Patient ID: Brandi Steele, female    DOB: March 09, 1955, 60 y.o.   MRN: [id number]  HPI: Brandi Steele is a 60 y.o. female  Chief Complaint  Patient presents with  . URI    pt states symptoms started this past weekend   HPI  Cough: Patient complains of chills, headache ear pain, myalgias, nasal congestion and productive cough.  Symptoms began 4 days ago.  The cough is productive of clear sputum, with shortness of breath and is aggravated by reclining position Associated symptoms include:chills. Patient does not have new pets. Patient does not have a history of asthma. Patient does not have a history of environmental allergens. Patient does not have recent travel. Patient does have a history of smoking. Patient  has previous Chest X-ray. Patient has not had a PPD done.     Relevant past medical, surgical, family and social history reviewed and updated as indicated. Interim medical history since our last visit reviewed. Allergies and medications reviewed and updated.  Review of Systems  Per HPI unless specifically indicated above     Objective:    BP 109/69 (BP Location: Left Arm, Patient Position: Sitting, Cuff Size: Large)   Pulse 75   Temp 98.5 F (36.9 C)   Wt 189 lb 6.4 oz (85.9 kg)   LMP  (LMP Unknown)   SpO2 (!) 89%   BMI 31.32 kg/m   Wt Readings from Last 3 Encounters:  09/12/15 189 lb 6.4 oz (85.9 kg)  05/28/15 190 lb 12.8 oz (86.5 kg)  05/22/15 190 lb (86.2 kg)    Physical Exam  Constitutional: She is oriented to person, place, and time. She appears well-developed and well-nourished. No distress.  HENT:  Head: Normocephalic and atraumatic.  Right Ear: Tympanic membrane and ear canal normal.  Left Ear: Tympanic membrane and ear canal  normal.  Nose: Rhinorrhea present. Right sinus exhibits no maxillary sinus tenderness and no frontal sinus tenderness. Left sinus exhibits no maxillary sinus tenderness and no frontal sinus tenderness.  Mouth/Throat: Mucous membranes are normal. Posterior oropharyngeal erythema present.  Eyes: Conjunctivae and lids are normal. Right eye exhibits no discharge. Left eye exhibits no discharge. No scleral icterus.  Cardiovascular: Normal rate and regular rhythm.   Pulmonary/Chest: Effort normal. No respiratory distress. She has decreased breath sounds.  Abdominal: Normal appearance. There is no splenomegaly or hepatomegaly.  Musculoskeletal: Normal range of motion.  Neurological: She is alert and oriented to person, place, and time.  Skin: Skin is intact. No rash noted. No pallor.  Psychiatric: She has a normal mood and affect. Her behavior is normal. Judgment and thought content normal.    Results for orders placed or performed in visit on 05/28/15  HIV antibody  Result Value Ref Range   HIV Screen 4th Generation wRfx Non Reactive Non Reactive  Hepatitis C antibody  Result Value Ref Range   Hep C Virus Ab <0.1 0.0 - 0.9 s/co ratio  Comprehensive metabolic panel  Result Value Ref Range   Glucose 96 65 - 99 mg/dL   BUN 7 (L) 8 - 27 mg/dL   Creatinine, Ser 0.71 0.57 - 1.00 mg/dL   GFR calc non  Af Amer 92 >59 mL/min/1.73   GFR calc Af Amer 106 >59 mL/min/1.73   BUN/Creatinine Ratio 10 (L) 12 - 28   Sodium 145 (H) 134 - 144 mmol/L   Potassium 3.9 3.5 - 5.2 mmol/L   Chloride 101 96 - 106 mmol/L   CO2 28 18 - 29 mmol/L   Calcium 9.0 8.7 - 10.3 mg/dL   Total Protein 6.6 6.0 - 8.5 g/dL   Albumin 4.5 3.6 - 4.8 g/dL   Globulin, Total 2.1 1.5 - 4.5 g/dL   Albumin/Globulin Ratio 2.1 1.2 - 2.2   Bilirubin Total 0.3 0.0 - 1.2 mg/dL   Alkaline Phosphatase 81 39 - 117 IU/L   AST 16 0 - 40 IU/L   ALT 17 0 - 32 IU/L  CBC with Differential/Platelet  Result Value Ref Range   WBC 6.9 3.4 - 10.8  x10E3/uL   RBC 4.95 3.77 - 5.28 x10E6/uL   Hemoglobin 14.9 11.1 - 15.9 g/dL   Hematocrit 45.4 34.0 - 46.6 %   MCV 92 79 - 97 fL   MCH 30.1 26.6 - 33.0 pg   MCHC 32.8 31.5 - 35.7 g/dL   RDW 14.7 12.3 - 15.4 %   Platelets 147 (L) 150 - 379 x10E3/uL   Neutrophils 69 %   Lymphs 22 %   Monocytes 7 %   Eos 2 %   Basos 0 %   Neutrophils Absolute 4.8 1.4 - 7.0 x10E3/uL   Lymphocytes Absolute 1.5 0.7 - 3.1 x10E3/uL   Monocytes Absolute 0.5 0.1 - 0.9 x10E3/uL   EOS (ABSOLUTE) 0.1 0.0 - 0.4 x10E3/uL   Basophils Absolute 0.0 0.0 - 0.2 x10E3/uL   Immature Granulocytes 0 %   Immature Grans (Abs) 0.0 0.0 - 0.1 x10E3/uL  TSH  Result Value Ref Range   TSH 0.713 0.450 - 4.500 uIU/mL  Lipid Panel w/o Chol/HDL Ratio  Result Value Ref Range   Cholesterol, Total 132 100 - 199 mg/dL   Triglycerides 138 0 - 149 mg/dL   HDL 34 (L) >39 mg/dL   VLDL Cholesterol Cal 28 5 - 40 mg/dL   LDL Calculated 70 0 - 99 mg/dL  Microalbumin, Urine Waived  Result Value Ref Range   Microalb, Ur Waived 30 (H) 0 - 19 mg/L   Creatinine, Urine Waived 200 10 - 300 mg/dL   Microalb/Creat Ratio <30 <30 mg/g  Uric acid  Result Value Ref Range   Uric Acid 5.1 2.5 - 7.1 mg/dL      Assessment & Plan:   Problem List Items Addressed This Visit    None    Visit Diagnoses    Cough    -  Primary   Relevant Orders   Veritor Flu A/B Waived   DG Chest 2 View   Acute exacerbation of chronic bronchitis (HCC)       Relevant Orders   DG Chest 2 View      Doxycycline to treat.  Chest x-ray for low O2 but baseline for her.    Follow up plan: Return if symptoms worsen or fail to improve.

## 2015-09-12 NOTE — Progress Notes (Signed)
BP 109/69 (BP Location: Left Arm, Patient Position: Sitting, Cuff Size: Large)   Pulse 75   Temp 98.5 F (36.9 C)   Wt 189 lb 6.4 oz (85.9 kg)   LMP  (LMP Unknown)   SpO2 (!) 89%   BMI 31.32 kg/m    Subjective:    Patient ID: Brandi Steele, female    DOB: [birth year], 60 y.o.   MRN: [id number]  HPI: Brandi Steele is a 60 y.o. female  HPI  No chief complaint on file.   Relevant past medical, surgical, family and social history reviewed and updated as indicated. Interim medical history since our last visit reviewed. Allergies and medications reviewed and updated.  Review of Systems  Per HPI unless specifically indicated above     Objective:    BP 109/69 (BP Location: Left Arm, Patient Position: Sitting, Cuff Size: Large)   Pulse 75   Temp 98.5 F (36.9 C)   Wt 189 lb 6.4 oz (85.9 kg)   LMP  (LMP Unknown)   SpO2 (!) 89%   BMI 31.32 kg/m   Wt Readings from Last 3 Encounters:  09/12/15 189 lb 6.4 oz (85.9 kg)  05/28/15 190 lb 12.8 oz (86.5 kg)  05/22/15 190 lb (86.2 kg)    Physical Exam  Results for orders placed or performed in visit on 05/28/15  HIV antibody  Result Value Ref Range   HIV Screen 4th Generation wRfx Non Reactive Non Reactive  Hepatitis C antibody  Result Value Ref Range   Hep C Virus Ab <0.1 0.0 - 0.9 s/co ratio  Comprehensive metabolic panel  Result Value Ref Range   Glucose 96 65 - 99 mg/dL   BUN 7 (L) 8 - 27 mg/dL   Creatinine, Ser 0.71 0.57 - 1.00 mg/dL   GFR calc non Af Amer 92 >59 mL/min/1.73   GFR calc Af Amer 106 >59 mL/min/1.73   BUN/Creatinine Ratio 10 (L) 12 - 28   Sodium 145 (H) 134 - 144 mmol/L   Potassium 3.9 3.5 - 5.2 mmol/L   Chloride 101 96 - 106 mmol/L   CO2 28 18 - 29 mmol/L   Calcium 9.0 8.7 - 10.3 mg/dL   Total Protein 6.6 6.0 - 8.5 g/dL   Albumin 4.5 3.6 - 4.8 g/dL   Globulin, Total 2.1 1.5 - 4.5 g/dL   Albumin/Globulin Ratio 2.1 1.2 - 2.2   Bilirubin Total 0.3 0.0 - 1.2 mg/dL   Alkaline Phosphatase 81 39  - 117 IU/L   AST 16 0 - 40 IU/L   ALT 17 0 - 32 IU/L  CBC with Differential/Platelet  Result Value Ref Range   WBC 6.9 3.4 - 10.8 x10E3/uL   RBC 4.95 3.77 - 5.28 x10E6/uL   Hemoglobin 14.9 11.1 - 15.9 g/dL   Hematocrit 45.4 34.0 - 46.6 %   MCV 92 79 - 97 fL   MCH 30.1 26.6 - 33.0 pg   MCHC 32.8 31.5 - 35.7 g/dL   RDW 14.7 12.3 - 15.4 %   Platelets 147 (L) 150 - 379 x10E3/uL   Neutrophils 69 %   Lymphs 22 %   Monocytes 7 %   Eos 2 %   Basos 0 %   Neutrophils Absolute 4.8 1.4 - 7.0 x10E3/uL   Lymphocytes Absolute 1.5 0.7 - 3.1 x10E3/uL   Monocytes Absolute 0.5 0.1 - 0.9 x10E3/uL   EOS (ABSOLUTE) 0.1 0.0 - 0.4 x10E3/uL   Basophils Absolute 0.0 0.0 - 0.2 x10E3/uL  Immature Granulocytes 0 %   Immature Grans (Abs) 0.0 0.0 - 0.1 x10E3/uL  TSH  Result Value Ref Range   TSH 0.713 0.450 - 4.500 uIU/mL  Lipid Panel w/o Chol/HDL Ratio  Result Value Ref Range   Cholesterol, Total 132 100 - 199 mg/dL   Triglycerides 138 0 - 149 mg/dL   HDL 34 (L) >39 mg/dL   VLDL Cholesterol Cal 28 5 - 40 mg/dL   LDL Calculated 70 0 - 99 mg/dL  Microalbumin, Urine Waived  Result Value Ref Range   Microalb, Ur Waived 30 (H) 0 - 19 mg/L   Creatinine, Urine Waived 200 10 - 300 mg/dL   Microalb/Creat Ratio <30 <30 mg/g  Uric acid  Result Value Ref Range   Uric Acid 5.1 2.5 - 7.1 mg/dL      Assessment & Plan:   Problem List Items Addressed This Visit    None    Visit Diagnoses   None.      Follow up plan: No Follow-up on file.

## 2015-09-14 ENCOUNTER — Ambulatory Visit: Payer: BLUE CROSS/BLUE SHIELD | Admitting: Unknown Physician Specialty

## 2015-09-19 ENCOUNTER — Ambulatory Visit (INDEPENDENT_AMBULATORY_CARE_PROVIDER_SITE_OTHER): Payer: BLUE CROSS/BLUE SHIELD | Admitting: Unknown Physician Specialty

## 2015-09-19 ENCOUNTER — Encounter: Payer: Self-pay | Admitting: Unknown Physician Specialty

## 2015-09-19 VITALS — BP 131/66 | Temp 98.2°F | Wt 185.4 lb

## 2015-09-19 DIAGNOSIS — G4700 Insomnia, unspecified: Secondary | ICD-10-CM | POA: Diagnosis not present

## 2015-09-19 DIAGNOSIS — F419 Anxiety disorder, unspecified: Secondary | ICD-10-CM | POA: Diagnosis not present

## 2015-09-19 DIAGNOSIS — R252 Cramp and spasm: Secondary | ICD-10-CM | POA: Diagnosis not present

## 2015-09-19 MED ORDER — ZOLPIDEM TARTRATE 10 MG PO TABS: 10.0000 mg | ORAL_TABLET | Freq: Every evening | ORAL | 0 refills | Status: DC | PRN

## 2015-09-19 NOTE — Assessment & Plan Note (Signed)
Ambien rx.  Encouraged to take 1/2 prn.

## 2015-09-19 NOTE — Progress Notes (Signed)
   BP 131/66 (BP Location: Left Arm, Patient Position: Sitting, Cuff Size: Large)   Temp 98.2 F (36.8 C)   Wt 185 lb 6.4 oz (84.1 kg)   LMP  (LMP Unknown)   SpO2 93%   BMI 30.66 kg/m    Subjective:    Patient ID: Brandi Steele, female    DOB: 18-May-1955, 60 y.o.   MRN: [id number]  HPI: Brandi Steele is a 60 y.o. female  Chief Complaint  Patient presents with  . Other    pt states her daughter passed away May 18, 2022 and wants to talk with Malachy Mood, pt states she has been having a muscle spasm in her left foot and leg   Pt states she has not been able to eat or sleep due to her daughter dying of a drug overdose.  She states she doesn't want any drugs but would like Ambien to help her sleep.    States she is having left foot and leg cramps.   She is taking Magnesium.   She is taking Lasix and wondering if it's a K deficiency.    Relevant past medical, surgical, family and social history reviewed and updated as indicated. Interim medical history since our last visit reviewed. Allergies and medications reviewed and updated.  Review of Systems  Per HPI unless specifically indicated above     Objective:    BP 131/66 (BP Location: Left Arm, Patient Position: Sitting, Cuff Size: Large)   Temp 98.2 F (36.8 C)   Wt 185 lb 6.4 oz (84.1 kg)   LMP  (LMP Unknown)   SpO2 93%   BMI 30.66 kg/m   Wt Readings from Last 3 Encounters:  09/19/15 185 lb 6.4 oz (84.1 kg)  09/12/15 189 lb 6.4 oz (85.9 kg)  05/28/15 190 lb 12.8 oz (86.5 kg)    Physical Exam  Constitutional: She is oriented to person, place, and time. She appears well-developed and well-nourished. No distress.  HENT:  Head: Normocephalic and atraumatic.  Eyes: Conjunctivae and lids are normal. Right eye exhibits no discharge. Left eye exhibits no discharge. No scleral icterus.  Cardiovascular: Normal rate.   Pulmonary/Chest: Effort normal.  Abdominal: Normal appearance. There is no splenomegaly or hepatomegaly.    Musculoskeletal: Normal range of motion.  Neurological: She is alert and oriented to person, place, and time.  Skin: Skin is intact. No rash noted. No pallor.  Psychiatric: Her behavior is normal. Judgment and thought content normal. Her affect is not inappropriate. She exhibits a depressed mood.    Results for orders placed or performed in visit on 09/12/15  Veritor Flu A/B Waived  Result Value Ref Range   Influenza A Negative Negative   Influenza B Negative Negative      Assessment & Plan:   Problem List Items Addressed This Visit      Unprioritized   Insomnia    Ambien rx.  Encouraged to take 1/2 prn.         Other Visit Diagnoses    Acute anxiety    -  Primary   related to grief   Muscle cramp, nocturnal       Relevant Orders   Comprehensive metabolic panel       Follow up plan: Return if symptoms worsen or fail to improve.

## 2015-09-20 LAB — COMPREHENSIVE METABOLIC PANEL
ALBUMIN: 4.6 g/dL (ref 3.6–4.8)
ALK PHOS: 88 IU/L (ref 39–117)
ALT: 25 IU/L (ref 0–32)
AST: 28 IU/L (ref 0–40)
Albumin/Globulin Ratio: 2.1 (ref 1.2–2.2)
BUN / CREAT RATIO: 8 — AB (ref 12–28)
BUN: 6 mg/dL — ABNORMAL LOW (ref 8–27)
Bilirubin Total: 0.3 mg/dL (ref 0.0–1.2)
CALCIUM: 9.3 mg/dL (ref 8.7–10.3)
CO2: 27 mmol/L (ref 18–29)
CREATININE: 0.73 mg/dL (ref 0.57–1.00)
Chloride: 97 mmol/L (ref 96–106)
GFR, EST AFRICAN AMERICAN: 102 mL/min/{1.73_m2} (ref >59)
GFR, EST NON AFRICAN AMERICAN: 89 mL/min/{1.73_m2} (ref >59)
GLOBULIN, TOTAL: 2.2 g/dL (ref 1.5–4.5)
Glucose: 134 mg/dL — ABNORMAL HIGH (ref 65–99)
Potassium: 3.7 mmol/L (ref 3.5–5.2)
SODIUM: 142 mmol/L (ref 134–144)
Total Protein: 6.8 g/dL (ref 6.0–8.5)

## 2015-09-24 ENCOUNTER — Other Ambulatory Visit: Payer: Self-pay | Admitting: Unknown Physician Specialty

## 2015-09-24 NOTE — Telephone Encounter (Signed)
Your patient 

## 2015-10-09 ENCOUNTER — Encounter: Payer: Self-pay | Admitting: Family Medicine

## 2015-10-09 ENCOUNTER — Ambulatory Visit (INDEPENDENT_AMBULATORY_CARE_PROVIDER_SITE_OTHER): Payer: BLUE CROSS/BLUE SHIELD | Admitting: Family Medicine

## 2015-10-09 VITALS — BP 101/66 | HR 80 | Temp 98.7°F | Ht 66.2 in | Wt 186.0 lb

## 2015-10-09 DIAGNOSIS — M545 Low back pain: Secondary | ICD-10-CM

## 2015-10-09 DIAGNOSIS — Z23 Encounter for immunization: Secondary | ICD-10-CM | POA: Diagnosis not present

## 2015-10-09 DIAGNOSIS — F4321 Adjustment disorder with depressed mood: Secondary | ICD-10-CM | POA: Diagnosis not present

## 2015-10-09 LAB — UA/M W/RFLX CULTURE, ROUTINE
BILIRUBIN UA: NEGATIVE
GLUCOSE, UA: NEGATIVE
KETONES UA: NEGATIVE
Leukocytes, UA: NEGATIVE
Nitrite, UA: NEGATIVE
Protein, UA: NEGATIVE
RBC UA: NEGATIVE
SPEC GRAV UA: 1.01 (ref 1.005–1.030)
UUROB: 0.2 mg/dL (ref 0.2–1.0)
pH, UA: 7 (ref 5.0–7.5)

## 2015-10-09 MED ORDER — PREDNISONE 20 MG PO TABS: 40.0000 mg | ORAL_TABLET | Freq: Every day | ORAL | 0 refills | Status: DC

## 2015-10-09 MED ORDER — SULFAMETHOXAZOLE-TRIMETHOPRIM 800-160 MG PO TABS: 1.0000 | ORAL_TABLET | Freq: Two times a day (BID) | ORAL | 0 refills | Status: DC

## 2015-10-09 NOTE — Progress Notes (Signed)
BP 101/66 (BP Location: Right Arm, Patient Position: Sitting, Cuff Size: Large)   Pulse 80   Temp 98.7 F (37.1 C)   Ht 5' 6.2" (1.681 m)   Wt 186 lb (84.4 kg)   LMP  (LMP Unknown)   SpO2 92%   BMI 29.84 kg/m    Subjective:    Patient ID: Brandi Steele, female    DOB: [birth year], 60 y.o.   MRN: [id number]  HPI: Brandi Steele is a 60 y.o. female  Chief Complaint  Patient presents with  . Back Pain    pt states pain started on right side and moved to lower back about 3 week ago. States pain has gotten worse over the last few days.    Right flank pain for about 3 weeks now, gradually progressing into a sharp pain that goes from right suprapubic area to low back. Frequent hx of UTIs, had some cipro left over (took about 4-5 doses of cipro and felt completely better for a day or so. Last dose was 2 days ago. Denies fever or chills, N/V. BMs have been regular. Has started having some dysuria since last night. Not taking anything right now for her symptoms as she does not like to take medicine.  Upon further discussion, patient lost her daughter to a drug overdose 3 weeks ago and all of this began right around that day while cleaning up daughter's apartment. She is under a lot of stress right now with family things happening.   Relevant past medical, surgical, family and social history reviewed and updated as indicated. Interim medical history since our last visit reviewed. Allergies and medications reviewed and updated.  Review of Systems  Constitutional: Negative for appetite change, chills and fever.  HENT: Negative.   Respiratory: Negative.   Cardiovascular: Negative.   Gastrointestinal: Negative.   Genitourinary: Positive for dysuria and flank pain.  Musculoskeletal: Positive for back pain.  Skin: Negative.   Neurological: Negative.   Psychiatric/Behavioral: Positive for dysphoric mood.    Per HPI unless specifically indicated above     Objective:    BP 101/66 (BP  Location: Right Arm, Patient Position: Sitting, Cuff Size: Large)   Pulse 80   Temp 98.7 F (37.1 C)   Ht 5' 6.2" (1.681 m)   Wt 186 lb (84.4 kg)   LMP  (LMP Unknown)   SpO2 92%   BMI 29.84 kg/m   Wt Readings from Last 3 Encounters:  10/09/15 186 lb (84.4 kg)  09/19/15 185 lb 6.4 oz (84.1 kg)  09/12/15 189 lb 6.4 oz (85.9 kg)    Physical Exam  Constitutional: She is oriented to person, place, and time. She appears well-developed and well-nourished.  HENT:  Head: Atraumatic.  Eyes: Conjunctivae are normal. No scleral icterus.  Neck: Normal range of motion. Neck supple.  Cardiovascular: Normal rate and normal heart sounds.   Pulmonary/Chest: Effort normal. No respiratory distress.  Abdominal: Soft. Bowel sounds are normal. She exhibits no distension.  Musculoskeletal: Normal range of motion. She exhibits tenderness (Moderate ttp over right low back).  Pain with right leg flexion from low back down to right groin  Neurological: She is alert and oriented to person, place, and time.  Skin: Skin is warm and dry.  Psychiatric: Her behavior is normal.  Tearful        Assessment & Plan:   Problem List Items Addressed This Visit    None    Visit Diagnoses    Low back pain,  unspecified back pain laterality, with sciatica presence unspecified    -  Primary   Likely musculoskeletal and stress related, will treat with prednisone burst and flexeril as needed. Bactrim sent in case urinary symptoms worsen. Await cx   Relevant Medications   predniSONE (DELTASONE) 20 MG tablet   Other Relevant Orders   UA/M w/rflx Culture, Routine (Completed)   Immunization due       Relevant Orders   Flu Vaccine QUAD 36+ mos IM (Completed)   Grief       Discussed grief counseling, numbers given for services in the area to help her.        Follow up plan: Return if symptoms worsen or fail to improve.

## 2015-10-09 NOTE — Patient Instructions (Addendum)

## 2015-10-22 ENCOUNTER — Other Ambulatory Visit: Payer: Self-pay

## 2015-10-22 MED ORDER — CYCLOBENZAPRINE HCL 10 MG PO TABS: 10.0000 mg | ORAL_TABLET | Freq: Three times a day (TID) | ORAL | 0 refills | Status: DC | PRN

## 2015-10-22 NOTE — Telephone Encounter (Signed)
Patient called in requesting refill for cyclobenazeprine for her back pain. She would like it sent to [facility] in [place].

## 2015-10-24 ENCOUNTER — Ambulatory Visit
Admission: RE | Admit: 2015-10-24 | Discharge: 2015-10-24 | Disposition: A | Discharge status: Home / Self Care | Payer: BLUE CROSS/BLUE SHIELD | Source: Ambulatory Visit | Attending: Unknown Physician Specialty | Admitting: Unknown Physician Specialty

## 2015-10-24 ENCOUNTER — Encounter: Payer: Self-pay | Admitting: Unknown Physician Specialty

## 2015-10-24 ENCOUNTER — Encounter: Payer: Self-pay | Admitting: Family Medicine

## 2015-10-24 ENCOUNTER — Ambulatory Visit (INDEPENDENT_AMBULATORY_CARE_PROVIDER_SITE_OTHER): Payer: BLUE CROSS/BLUE SHIELD | Admitting: Unknown Physician Specialty

## 2015-10-24 VITALS — BP 94/58 | HR 77 | Temp 98.2°F | Wt 186.4 lb

## 2015-10-24 DIAGNOSIS — M47896 Other spondylosis, lumbar region: Secondary | ICD-10-CM | POA: Insufficient documentation

## 2015-10-24 DIAGNOSIS — I700 Atherosclerosis of aorta: Secondary | ICD-10-CM | POA: Diagnosis not present

## 2015-10-24 DIAGNOSIS — M545 Low back pain: Secondary | ICD-10-CM

## 2015-10-24 LAB — CBC WITH DIFFERENTIAL/PLATELET
Hematocrit: 45.3 % (ref 34.0–46.6)
Hemoglobin: 14.9 g/dL (ref 11.1–15.9)
LYMPHS ABS: 2.2 10*3/uL (ref 0.7–3.1)
LYMPHS: 22 %
MCH: 30.9 pg (ref 26.6–33.0)
MCHC: 32.9 g/dL (ref 31.5–35.7)
MCV: 94 fL (ref 79–97)
MID (ABSOLUTE): 0.3 10*3/uL (ref 0.1–1.6)
MID: 3 %
NEUTROS ABS: 7.5 10*3/uL — AB (ref 1.4–7.0)
Neutrophils: 76 %
PLATELETS: 149 10*3/uL — AB (ref 150–379)
RBC: 4.82 x10E6/uL (ref 3.77–5.28)
RDW: 15.6 % — AB (ref 12.3–15.4)
WBC: 10 10*3/uL (ref 3.4–10.8)

## 2015-10-24 MED ORDER — HYDROCODONE-ACETAMINOPHEN 5-325 MG PO TABS: 1.0000 | ORAL_TABLET | Freq: Four times a day (QID) | ORAL | 0 refills | Status: DC | PRN

## 2015-10-24 NOTE — Patient Instructions (Addendum)
Back Pain, Adult °Back pain is very common in adults. The cause of back pain is rarely dangerous and the pain often gets better over time. The cause of your back pain may not be known. Some common causes of back pain include: °· Strain of the muscles or ligaments supporting the spine. °· Wear and tear (degeneration) of the spinal disks. °· Arthritis. °· Direct injury to the back. °For many people, back pain may return. Since back pain is rarely dangerous, most people can learn to manage this condition on their own. °HOME CARE INSTRUCTIONS °Watch your back pain for any changes. The following actions may help to lessen any discomfort you are feeling: °· Remain active. It is stressful on your back to sit or stand in one place for long periods of time. Do not sit, drive, or stand in one place for more than 30 minutes at a time. Take short walks on even surfaces as soon as you are able. Try to increase the length of time you walk each day. °· Exercise regularly as directed by your health care provider. Exercise helps your back heal faster. It also helps avoid future injury by keeping your muscles strong and flexible. °· Do not stay in bed. Resting more than 1-2 days can delay your recovery. °· Pay attention to your body when you bend and lift. The most comfortable positions are those that put less stress on your recovering back. Always use proper lifting techniques, including: °· Bending your knees. °· Keeping the load close to your body. °· Avoiding twisting. °· Find a comfortable position to sleep. Use a firm mattress and lie on your side with your knees slightly bent. If you lie on your back, put a pillow under your knees. °· Avoid feeling anxious or stressed. Stress increases muscle tension and can worsen back pain. It is important to recognize when you are anxious or stressed and learn ways to manage it, such as with exercise. °· Take medicines only as directed by your health care provider. Over-the-counter  medicines to reduce pain and inflammation are often the most helpful. Your health care provider may prescribe muscle relaxant drugs. These medicines help dull your pain so you can more quickly return to your normal activities and healthy exercise. °· Apply ice to the injured area: °· Put ice in a plastic bag. °· Place a towel between your skin and the bag. °· Leave the ice on for 20 minutes, 2-3 times a day for the first 2-3 days. After that, ice and heat may be alternated to reduce pain and spasms. °· Maintain a healthy weight. Excess weight puts extra stress on your back and makes it difficult to maintain good posture. °SEEK MEDICAL CARE IF: °· You have pain that is not relieved with rest or medicine. °· You have increasing pain going down into the legs or buttocks. °· You have pain that does not improve in one week. °· You have night pain. °· You lose weight. °· You have a fever or chills. °SEEK IMMEDIATE MEDICAL CARE IF:  °· You develop new bowel or bladder control problems. °· You have unusual weakness or numbness in your arms or legs. °· You develop nausea or vomiting. °· You develop abdominal pain. °· You feel faint. °  °This information is not intended to replace advice given to you by your health care provider. Make sure you discuss any questions you have with your health care provider. °  °Document Released: 01/13/2005 Document Revised: 02/03/2014 Document Reviewed: 05/17/2013 °Elsevier Interactive Patient Education ©2016 Else[employer]   [employer]  Back Exercises The following exercises strengthen the muscles that help to support the back. They also help to keep the lower back flexible. Doing these exercises can help to prevent back pain or lessen existing pain. If you have back pain or discomfort, try doing these exercises 2-3 times each day or as told by your health care provider. When the pain goes away, do them once each day, but increase the number of times that you repeat the steps for each exercise (do more  repetitions). If you do not have back pain or discomfort, do these exercises once each day or as told by your health care provider. EXERCISES Single Knee to Chest Repeat these steps 3-5 times for each leg: 1. Lie on your back on a firm bed or the floor with your legs extended. 2. Bring one knee to your chest. Your other leg should stay extended and in contact with the floor. 3. Hold your knee in place by grabbing your knee or thigh. 4. Pull on your knee until you feel a gentle stretch in your lower back. 5. Hold the stretch for 10-30 seconds. 6. Slowly release and straighten your leg. Pelvic Tilt Repeat these steps 5-10 times: 1. Lie on your back on a firm bed or the floor with your legs extended. 2. Bend your knees so they are pointing toward the ceiling and your feet are flat on the floor. 3. Tighten your lower abdominal muscles to press your lower back against the floor. This motion will tilt your pelvis so your tailbone points up toward the ceiling instead of pointing to your feet or the floor. 4. With gentle tension and even breathing, hold this position for 5-10 seconds. Cat-Cow Repeat these steps until your lower back becomes more flexible: 1. Get into a hands-and-knees position on a firm surface. Keep your hands under your shoulders, and keep your knees under your hips. You may place padding under your knees for comfort. 2. Let your head hang down, and point your tailbone toward the floor so your lower back becomes rounded like the back of a cat. 3. Hold this position for 5 seconds. 4. Slowly lift your head and point your tailbone up toward the ceiling so your back forms a sagging arch like the back of a cow. 5. Hold this position for 5 seconds. Press-Ups Repeat these steps 5-10 times: 1. Lie on your abdomen (face-down) on the floor. 2. Place your palms near your head, about shoulder-width apart. 3. While you keep your back as relaxed as possible and keep your hips on the floor,  slowly straighten your arms to raise the top half of your body and lift your shoulders. Do not use your back muscles to raise your upper torso. You may adjust the placement of your hands to make yourself more comfortable. 4. Hold this position for 5 seconds while you keep your back relaxed. 5. Slowly return to lying flat on the floor. Bridges Repeat these steps 10 times: 1. Lie on your back on a firm surface. 2. Bend your knees so they are pointing toward the ceiling and your feet are flat on the floor. 3. Tighten your buttocks muscles and lift your buttocks off of the floor until your waist is at almost the same height as your knees. You should feel the muscles working in your buttocks and the back of your thighs. If you do not feel these muscles, slide your feet 1-2 inches farther away from your buttocks. 4. Hold this  position for 3-5 seconds. 5. Slowly lower your hips to the starting position, and allow your buttocks muscles to relax completely. If this exercise is too easy, try doing it with your arms crossed over your chest. Abdominal Crunches Repeat these steps 5-10 times: 1. Lie on your back on a firm bed or the floor with your legs extended. 2. Bend your knees so they are pointing toward the ceiling and your feet are flat on the floor. 3. Cross your arms over your chest. 4. Tip your chin slightly toward your chest without bending your neck. 5. Tighten your abdominal muscles and slowly raise your trunk (torso) high enough to lift your shoulder blades a tiny bit off of the floor. Avoid raising your torso higher than that, because it can put too much stress on your low back and it does not help to strengthen your abdominal muscles. 6. Slowly return to your starting position. Back Lifts Repeat these steps 5-10 times: 1. Lie on your abdomen (face-down) with your arms at your sides, and rest your forehead on the floor. 2. Tighten the muscles in your legs and your buttocks. 3. Slowly lift  your chest off of the floor while you keep your hips pressed to the floor. Keep the back of your head in line with the curve in your back. Your eyes should be looking at the floor. 4. Hold this position for 3-5 seconds. 5. Slowly return to your starting position. SEEK MEDICAL CARE IF:  Your back pain or discomfort gets much worse when you do an exercise.  Your back pain or discomfort does not lessen within 2 hours after you exercise. If you have any of these problems, stop doing these exercises right away. Do not do them again unless your health care provider says that you can. SEEK IMMEDIATE MEDICAL CARE IF:  You develop sudden, severe back pain. If this happens, stop doing the exercises right away. Do not do them again unless your health care provider says that you can.   This information is not intended to replace advice given to you by your health care provider. Make sure you discuss any questions you have with your health care provider.   Document Released: 02/21/2004 Document Revised: 10/04/2014 Document Reviewed: 03/09/2014 Elsevier Interactive Patient Education [employer].

## 2015-10-24 NOTE — Addendum Note (Signed)
Addended by: Moss Mc on: 10/24/2015 04:26 PM   Modules accepted: Orders

## 2015-10-24 NOTE — Progress Notes (Signed)
BP (!) 94/58 (BP Location: Left Arm, Cuff Size: Normal)   Pulse 77   Temp 98.2 F (36.8 C)   Wt 186 lb 6.4 oz (84.6 kg)   LMP  (LMP Unknown)   SpO2 94%   BMI 29.90 kg/m    Subjective:    Patient ID: Brandi Steele, female    DOB: 21-Mar-1955, 60 y.o.   MRN: [id number]  HPI: Brandi Steele is a 60 y.o. female  Chief Complaint  Patient presents with  . Back Pain    pt states she has been having lower back pain for about a week    Back pain Has had back pain for about 1 week.  States it burns.  Not help with Flexeril but mild improvement with Tylenol.  States no alleviating or aggravating factors but does admit to more pain with activity.  It hurts worse from sitting to standing.  She is using a heating pad.  Not sleeping well due to the pain.  No pain down leg.    Hypotension Low normal BP.  Will DC Lisinopril+  Relevant past medical, surgical, family and social history reviewed and updated as indicated. Interim medical history since our last visit reviewed. Allergies and medications reviewed and updated.  Review of Systems  Per HPI unless specifically indicated above     Objective:    BP (!) 94/58 (BP Location: Left Arm, Cuff Size: Normal)   Pulse 77   Temp 98.2 F (36.8 C)   Wt 186 lb 6.4 oz (84.6 kg)   LMP  (LMP Unknown)   SpO2 94%   BMI 29.90 kg/m   Wt Readings from Last 3 Encounters:  10/24/15 186 lb 6.4 oz (84.6 kg)  10/09/15 186 lb (84.4 kg)  09/19/15 185 lb 6.4 oz (84.1 kg)    Physical Exam  Constitutional: She is oriented to person, place, and time. She appears well-developed and well-nourished. No distress.  HENT:  Head: Normocephalic and atraumatic.  Eyes: Conjunctivae and lids are normal. Right eye exhibits no discharge. Left eye exhibits no discharge. No scleral icterus.  Neck: Normal range of motion. Neck supple. No JVD present. Carotid bruit is not present.  Cardiovascular: Normal rate, regular rhythm and normal heart sounds.     Pulmonary/Chest: Effort normal and breath sounds normal.  Abdominal: Normal appearance. There is no splenomegaly or hepatomegaly.  Musculoskeletal:       Lumbar back: She exhibits decreased range of motion, tenderness, pain and spasm.  Neurological: She is alert and oriented to person, place, and time.  Skin: Skin is warm, dry and intact. No rash noted. No pallor.  Psychiatric: She has a normal mood and affect. Her behavior is normal. Judgment and thought content normal.   WBC is normal.  Unable to give [place] a urine.    Results for orders placed or performed in visit on 10/09/15  UA/M w/rflx Culture, Routine  Result Value Ref Range   Specific Gravity, UA 1.010 1.005 - 1.030   pH, UA 7.0 5.0 - 7.5   Color, UA Yellow Yellow   Appearance Ur Clear Clear   Leukocytes, UA Negative Negative   Protein, UA Negative Negative/Trace   Glucose, UA Negative Negative   Ketones, UA Negative Negative   RBC, UA Negative Negative   Bilirubin, UA Negative Negative   Urobilinogen, Ur 0.2 0.2 - 1.0 mg/dL   Nitrite, UA Negative Negative      Assessment & Plan:   Problem List Items Addressed This Visit  None    Visit Diagnoses    Low back pain, unspecified back pain laterality, with sciatica presence unspecified    -  Primary   Relevant Medications   HYDROcodone-acetaminophen (NORCO/VICODIN) 5-325 MG tablet   Other Relevant Orders   UA/M w/rflx Culture, Routine   DG Lumbar Spine Complete   CBC With Differential/Platelet       Follow up plan: Return if symptoms worsen or fail to improve.

## 2015-10-26 NOTE — Progress Notes (Signed)
Patient notified of results by phone.  Discussed OA of the spine

## 2015-12-01 ENCOUNTER — Other Ambulatory Visit: Payer: Self-pay | Admitting: Unknown Physician Specialty

## 2015-12-23 ENCOUNTER — Other Ambulatory Visit: Payer: Self-pay | Admitting: Unknown Physician Specialty

## 2015-12-24 ENCOUNTER — Telehealth: Payer: Self-pay

## 2015-12-24 NOTE — Telephone Encounter (Signed)
Patient called in and wanted to know how she could get a replacement tube for her nebulizer. Malachy Mood, is it OK to write on a RX pad and give to patient to take to a medical supply store?

## 2015-12-25 ENCOUNTER — Telehealth: Payer: Self-pay | Admitting: Unknown Physician Specialty

## 2015-12-25 NOTE — Telephone Encounter (Signed)
RX filled out. Will make a copy for patient's chart and place it up front for patient pick up. Patient notified that it could be picked up.

## 2015-12-25 NOTE — Telephone Encounter (Signed)
Pt called stated she needs a written RX for the tubing for her oxygen machine. Please call pt when RX is ready for pick up. Please complete as soon as possible. Pt will have to drive to [facility] to pick up tubing. Thanks.

## 2015-12-27 ENCOUNTER — Encounter (INDEPENDENT_AMBULATORY_CARE_PROVIDER_SITE_OTHER): Payer: Self-pay

## 2015-12-27 ENCOUNTER — Ambulatory Visit (INDEPENDENT_AMBULATORY_CARE_PROVIDER_SITE_OTHER): Payer: BLUE CROSS/BLUE SHIELD | Admitting: Family Medicine

## 2015-12-27 ENCOUNTER — Encounter: Payer: Self-pay | Admitting: Family Medicine

## 2015-12-27 VITALS — BP 123/67 | HR 79 | Temp 98.3°F | Wt 186.0 lb

## 2015-12-27 DIAGNOSIS — I10 Essential (primary) hypertension: Secondary | ICD-10-CM | POA: Diagnosis not present

## 2015-12-27 DIAGNOSIS — J449 Chronic obstructive pulmonary disease, unspecified: Secondary | ICD-10-CM

## 2015-12-27 DIAGNOSIS — I509 Heart failure, unspecified: Secondary | ICD-10-CM

## 2015-12-27 MED ORDER — BENZONATATE 100 MG PO CAPS
200.0000 mg | ORAL_CAPSULE | Freq: Three times a day (TID) | ORAL | 0 refills | Status: DC | PRN
Start: 2015-12-27 — End: 2016-01-11

## 2015-12-27 MED ORDER — FUROSEMIDE 40 MG PO TABS: 40.0000 mg | ORAL_TABLET | Freq: Every day | ORAL | 1 refills | Status: DC

## 2015-12-27 MED ORDER — LISINOPRIL 2.5 MG PO TABS: 2.5000 mg | ORAL_TABLET | Freq: Every day | ORAL | 12 refills | Status: DC

## 2015-12-27 MED ORDER — HYDROCOD POLST-CPM POLST ER 10-8 MG/5ML PO SUER: 5.0000 mL | Freq: Two times a day (BID) | ORAL | 0 refills | Status: DC | PRN

## 2015-12-27 MED ORDER — DOXYCYCLINE HYCLATE 100 MG PO TABS: 100.0000 mg | ORAL_TABLET | Freq: Two times a day (BID) | ORAL | 0 refills | Status: DC

## 2015-12-27 MED ORDER — DM-GUAIFENESIN ER 30-600 MG PO TB12: 1.0000 | ORAL_TABLET | Freq: Two times a day (BID) | ORAL | 1 refills | Status: DC

## 2015-12-27 MED ORDER — ALBUTEROL SULFATE HFA 108 (90 BASE) MCG/ACT IN AERS: 2.0000 | INHALATION_SPRAY | Freq: Four times a day (QID) | RESPIRATORY_TRACT | 6 refills | Status: DC | PRN

## 2015-12-27 NOTE — Assessment & Plan Note (Signed)
Under good control with current regimen. Refills given today

## 2015-12-27 NOTE — Assessment & Plan Note (Signed)
Stable, refilled lasix today.

## 2015-12-27 NOTE — Assessment & Plan Note (Signed)
With acute exacerbation from viral URI. Will treat with doxycycline, albuterol, tessalon, and tussionex. Call if worsening

## 2015-12-27 NOTE — Patient Instructions (Addendum)
Follow up for BP check in 6 months

## 2015-12-27 NOTE — Progress Notes (Signed)
BP 123/67   Pulse 79   Temp 98.3 F (36.8 C)   Wt 186 lb (84.4 kg)   LMP  (LMP Unknown)   SpO2 94%   BMI 29.84 kg/m    Subjective:    Patient ID: Brandi Steele, female    DOB: 05-05-1955, 60 y.o.   MRN: [id number]  HPI: Brandi Steele is a 60 y.o. female  Chief Complaint  Patient presents with  . URI    x 4 days, head and chest congestion, lots of mucus, coughed up green sputum today.  No fever.    Patient presents with 4 day history of congestion, productive cough with thick green sputum, mild chest tightness and wheezing, and persistent chills and sweats. Has not been taking anything OTC at this time. Does have COPD so felt like she needed to come in before things got worse. No fever, CP, SOB. Has been trying not to exert herself much.   Also needing refills on BP medication and lasix. No edema, orthopnea, CP, or high blood pressure episodes. Taking medicines faithfully without side effects.   Past Medical History:  Diagnosis Date  . Anxiety   . Aortic stenosis   . CHF (congestive heart failure) ([place])   . COPD (chronic obstructive pulmonary disease) ([place])   . Depression   . Fibromyalgia   . GERD (gastroesophageal reflux disease)   . Hyperlipidemia   . Hypertension   . Hypertensive heart disease   . Myalgia   . Obesity   . Sciatica   . Seborrheic keratosis   . Stress incontinence    Social History   Social History  . Marital status: Married    Spouse name: N/A  . Number of children: N/A  . Years of education: N/A   Occupational History  . Not on file.   Social History Main Topics  . Smoking status: Current Every Day Smoker    Packs/day: 0.00    Types: Cigarettes  . Smokeless tobacco: Never Used  . Alcohol use No  . Drug use: No  . Sexual activity: No   Other Topics Concern  . Not on file   Social History Narrative  . No narrative on file    Relevant past medical, surgical, family and social history reviewed and updated as indicated. Interim  medical history since our last visit reviewed. Allergies and medications reviewed and updated.  Review of Systems  Constitutional: Positive for chills and diaphoresis. Negative for fever.  HENT: Positive for congestion and rhinorrhea.   Eyes: Negative.   Respiratory: Positive for cough, chest tightness and wheezing.   Cardiovascular: Negative.   Gastrointestinal: Negative.   Genitourinary: Negative.   Musculoskeletal: Negative.   Skin: Negative.   Neurological: Negative.   Psychiatric/Behavioral: Negative.     Per HPI unless specifically indicated above     Objective:    BP 123/67   Pulse 79   Temp 98.3 F (36.8 C)   Wt 186 lb (84.4 kg)   LMP  (LMP Unknown)   SpO2 94%   BMI 29.84 kg/m   Wt Readings from Last 3 Encounters:  12/27/15 186 lb (84.4 kg)  10/24/15 186 lb 6.4 oz (84.6 kg)  10/09/15 186 lb (84.4 kg)    Physical Exam  Constitutional: She is oriented to person, place, and time. She appears well-developed and well-nourished. No distress.  HENT:  Head: Atraumatic.  Eyes: Conjunctivae are normal. Pupils are equal, round, and reactive to light. No scleral icterus.  Neck:  Normal range of motion. Neck supple.  Cardiovascular: Normal rate and normal heart sounds.   Pulmonary/Chest: Effort normal. No respiratory distress. She has wheezes (mild diffuse wheezes). She has no rales.  Musculoskeletal: Normal range of motion. She exhibits no edema.  Neurological: She is alert and oriented to person, place, and time.  Skin: Skin is warm and dry.  Psychiatric: She has a normal mood and affect. Her behavior is normal.  Nursing note and vitals reviewed.     Assessment & Plan:   Problem List Items Addressed This Visit      Cardiovascular and Mediastinum   CHF (congestive heart failure) (HCC)    Stable, refilled lasix today.       Relevant Medications   furosemide (LASIX) 40 MG tablet   lisinopril (PRINIVIL,ZESTRIL) 2.5 MG tablet   Essential hypertension    Under  good control with current regimen. Refills given today      Relevant Medications   furosemide (LASIX) 40 MG tablet   lisinopril (PRINIVIL,ZESTRIL) 2.5 MG tablet     Respiratory   COPD (chronic obstructive pulmonary disease) ([place]) - Primary    With acute exacerbation from viral URI. Will treat with doxycycline, albuterol, tessalon, and tussionex. Call if worsening      Relevant Medications   dextromethorphan-guaiFENesin (MUCINEX DM) 30-600 MG 12hr tablet   albuterol (PROVENTIL HFA;VENTOLIN HFA) 108 (90 Base) MCG/ACT inhaler   benzonatate (TESSALON) 100 MG capsule   chlorpheniramine-HYDROcodone (TUSSIONEX PENNKINETIC ER) 10-8 MG/5ML SUER       Follow up plan: Return in about 6 months (around 06/25/2016) for HTN, CHF.

## 2016-01-01 ENCOUNTER — Telehealth: Payer: Self-pay | Admitting: *Deleted

## 2016-01-01 NOTE — Telephone Encounter (Signed)
Received referral for low dose lung cancer screening CT scan. Message left at phone number listed in EMR with friend for patient to call me back to facilitate scheduling scan.

## 2016-01-07 ENCOUNTER — Telehealth: Payer: Self-pay | Admitting: *Deleted

## 2016-01-07 NOTE — Telephone Encounter (Signed)
Received referral for initial lung cancer screening scan. Contacted patient and obtained smoking history,(current, 40 pack year) as well as answering questions related to screening process. Patient denies signs of lung cancer such as weight loss or hemoptysis. Patient denies comorbidity that would prevent curative treatment if lung cancer were found. Patient is tentatively scheduled for shared decision making visit and CT scan on 12/19/17at 1:30pm, pending insurance approval from business office.

## 2016-01-11 ENCOUNTER — Encounter: Payer: Self-pay | Admitting: Unknown Physician Specialty

## 2016-01-11 ENCOUNTER — Ambulatory Visit (INDEPENDENT_AMBULATORY_CARE_PROVIDER_SITE_OTHER): Payer: BLUE CROSS/BLUE SHIELD | Admitting: Unknown Physician Specialty

## 2016-01-11 DIAGNOSIS — F322 Major depressive disorder, single episode, severe without psychotic features: Secondary | ICD-10-CM | POA: Diagnosis not present

## 2016-01-11 DIAGNOSIS — F329 Major depressive disorder, single episode, unspecified: Secondary | ICD-10-CM | POA: Insufficient documentation

## 2016-01-11 DIAGNOSIS — F32A Depression, unspecified: Secondary | ICD-10-CM | POA: Insufficient documentation

## 2016-01-11 MED ORDER — SERTRALINE HCL 50 MG PO TABS: 50.0000 mg | ORAL_TABLET | Freq: Every day | ORAL | 3 refills | Status: DC

## 2016-01-11 NOTE — Patient Instructions (Signed)
Go to [employer] "Find a Therapist."

## 2016-01-11 NOTE — Assessment & Plan Note (Addendum)
Pt with severe depression.  Discussed counseling and will talk to hospice counseling

## 2016-01-11 NOTE — Progress Notes (Signed)
BP 108/67 (BP Location: Left Arm, Patient Position: Sitting, Cuff Size: Normal)   Pulse 78   Temp 98 F (36.7 C)   Wt 187 lb 3.2 oz (84.9 kg)   LMP  (LMP Unknown)   SpO2 97%   BMI 30.03 kg/m    Subjective:    Patient ID: Brandi Steele, female    DOB: [birth year], 60 y.o.   MRN: [id number]  HPI: Brandi Steele is a 60 y.o. female  Chief Complaint  Patient presents with  . Depression   Depression "We think I'm depressed" and has been in denial for a couple of years.  She states she has not interest in things.  She had been on Zoloft in the past which did help but seemed to be a high dose.  No suicidal ideation.   Depression screen [facility] 2/9 01/11/2016 05/28/2015 05/28/2015  Decreased Interest 2 0 0  Down, Depressed, Hopeless 2 1 0  PHQ - 2 Score 4 1 0  Altered sleeping 3 - -  Tired, decreased energy 3 - -  Change in appetite 3 - -  Feeling bad or failure about yourself  2 - -  Trouble concentrating 3 - -  Moving slowly or fidgety/restless 2 - -  Suicidal thoughts 0 - -  PHQ-9 Score 20 - -    Relevant past medical, surgical, family and social history reviewed and updated as indicated. Interim medical history since our last visit reviewed. Allergies and medications reviewed and updated.  Review of Systems  Per HPI unless specifically indicated above     Objective:    BP 108/67 (BP Location: Left Arm, Patient Position: Sitting, Cuff Size: Normal)   Pulse 78   Temp 98 F (36.7 C)   Wt 187 lb 3.2 oz (84.9 kg)   LMP  (LMP Unknown)   SpO2 97%   BMI 30.03 kg/m   Wt Readings from Last 3 Encounters:  01/11/16 187 lb 3.2 oz (84.9 kg)  12/27/15 186 lb (84.4 kg)  10/24/15 186 lb 6.4 oz (84.6 kg)    Physical Exam  Constitutional: She is oriented to person, place, and time. She appears well-developed and well-nourished. No distress.  HENT:  Head: Normocephalic and atraumatic.  Eyes: Conjunctivae and lids are normal. Right eye exhibits no discharge. Left eye exhibits no  discharge. No scleral icterus.  Neck: Normal range of motion. Neck supple. No JVD present. Carotid bruit is not present.  Cardiovascular: Normal rate, regular rhythm and normal heart sounds.   Pulmonary/Chest: Effort normal and breath sounds normal.  Abdominal: Normal appearance. There is no splenomegaly or hepatomegaly.  Musculoskeletal: Normal range of motion.  Neurological: She is alert and oriented to person, place, and time.  Skin: Skin is warm, dry and intact. No rash noted. No pallor.  Psychiatric: She has a normal mood and affect. Her behavior is normal. Judgment and thought content normal.    Results for orders placed or performed in visit on 10/24/15  CBC With Differential/Platelet  Result Value Ref Range   WBC 10.0 3.4 - 10.8 x10E3/uL   RBC 4.82 3.77 - 5.28 x10E6/uL   Hemoglobin 14.9 11.1 - 15.9 g/dL   Hematocrit 45.3 34.0 - 46.6 %   MCV 94 79 - 97 fL   MCH 30.9 26.6 - 33.0 pg   MCHC 32.9 31.5 - 35.7 g/dL   RDW 15.6 (H) 12.3 - 15.4 %   Platelets 149 (L) 150 - 379 x10E3/uL   Neutrophils 76 %  Lymphs 22 %   MID 3 %   Neutrophils Absolute 7.5 (H) 1.4 - 7.0 x10E3/uL   Lymphocytes Absolute 2.2 0.7 - 3.1 x10E3/uL   MID (Absolute) 0.3 0.1 - 1.6 X10E3/uL      Assessment & Plan:   Problem List Items Addressed This Visit      Unprioritized   Depression    Pt with severe depression.  Discussed counseling and will talk to hospice counseling      Relevant Medications   sertraline (ZOLOFT) 50 MG tablet       Follow up plan: Return in about 4 weeks (around 02/08/2016).

## 2016-01-14 ENCOUNTER — Other Ambulatory Visit: Payer: Self-pay | Admitting: *Deleted

## 2016-01-14 DIAGNOSIS — Z87891 Personal history of nicotine dependence: Secondary | ICD-10-CM

## 2016-01-15 ENCOUNTER — Ambulatory Visit
Admission: RE | Admit: 2016-01-15 | Discharge: 2016-01-15 | Disposition: A | Discharge status: Home / Self Care | Payer: BLUE CROSS/BLUE SHIELD | Source: Ambulatory Visit | Attending: Oncology | Admitting: Oncology

## 2016-01-15 ENCOUNTER — Encounter: Payer: Self-pay | Admitting: Oncology

## 2016-01-15 ENCOUNTER — Inpatient Hospital Stay: Payer: BLUE CROSS/BLUE SHIELD | Attending: Oncology | Admitting: Oncology

## 2016-01-15 DIAGNOSIS — Z122 Encounter for screening for malignant neoplasm of respiratory organs: Secondary | ICD-10-CM | POA: Diagnosis not present

## 2016-01-15 DIAGNOSIS — I2510 Atherosclerotic heart disease of native coronary artery without angina pectoris: Secondary | ICD-10-CM | POA: Insufficient documentation

## 2016-01-15 DIAGNOSIS — I700 Atherosclerosis of aorta: Secondary | ICD-10-CM | POA: Diagnosis not present

## 2016-01-15 DIAGNOSIS — Z87891 Personal history of nicotine dependence: Secondary | ICD-10-CM

## 2016-01-15 DIAGNOSIS — J439 Emphysema, unspecified: Secondary | ICD-10-CM | POA: Insufficient documentation

## 2016-01-15 DIAGNOSIS — F17210 Nicotine dependence, cigarettes, uncomplicated: Secondary | ICD-10-CM

## 2016-01-17 ENCOUNTER — Encounter: Payer: Self-pay | Admitting: *Deleted

## 2016-01-19 DIAGNOSIS — Z87891 Personal history of nicotine dependence: Secondary | ICD-10-CM | POA: Insufficient documentation

## 2016-01-19 NOTE — Progress Notes (Signed)
In accordance with CMS guidelines, patient has met eligibility criteria including age, absence of signs or symptoms of lung cancer.  Social History  Substance Use Topics  . Smoking status: Current Every Day Smoker    Packs/day: 1.00    Years: 40.00    Types: Cigarettes  . Smokeless tobacco: Never Used  . Alcohol use No     A shared decision-making session was conducted prior to the performance of CT scan. This includes one or more decision aids, includes benefits and harms of screening, follow-up diagnostic testing, over-diagnosis, false positive rate, and total radiation exposure.  Counseling on the importance of adherence to annual lung cancer LDCT screening, impact of co-morbidities, and ability or willingness to undergo diagnosis and treatment is imperative for compliance of the program.  Counseling on the importance of continued smoking cessation for former smokers; the importance of smoking cessation for current smokers, and information about tobacco cessation interventions have been given to patient including [place] and [facility] [place] programs.  Written order for lung cancer screening with LDCT has been given to the patient and any and all questions have been answered to the best of my abilities.   Yearly follow up will be coordinated by Burgess Estelle, Thoracic Navigator.

## 2016-01-30 ENCOUNTER — Other Ambulatory Visit: Payer: Self-pay | Admitting: Unknown Physician Specialty

## 2016-02-08 ENCOUNTER — Ambulatory Visit: Payer: BLUE CROSS/BLUE SHIELD | Admitting: Unknown Physician Specialty

## 2016-02-18 ENCOUNTER — Encounter: Payer: Self-pay | Admitting: Unknown Physician Specialty

## 2016-02-18 ENCOUNTER — Ambulatory Visit (INDEPENDENT_AMBULATORY_CARE_PROVIDER_SITE_OTHER): Payer: BLUE CROSS/BLUE SHIELD | Admitting: Unknown Physician Specialty

## 2016-02-18 DIAGNOSIS — F17200 Nicotine dependence, unspecified, uncomplicated: Secondary | ICD-10-CM

## 2016-02-18 DIAGNOSIS — F322 Major depressive disorder, single episode, severe without psychotic features: Secondary | ICD-10-CM | POA: Diagnosis not present

## 2016-02-18 DIAGNOSIS — I2510 Atherosclerotic heart disease of native coronary artery without angina pectoris: Secondary | ICD-10-CM | POA: Diagnosis not present

## 2016-02-18 DIAGNOSIS — G5601 Carpal tunnel syndrome, right upper limb: Secondary | ICD-10-CM

## 2016-02-18 NOTE — Assessment & Plan Note (Signed)
Discussed risk factor reduction

## 2016-02-18 NOTE — Assessment & Plan Note (Signed)
Wrist splint at night

## 2016-02-18 NOTE — Assessment & Plan Note (Signed)
Continue Zoloft.  Look into counseling. Recheck in 1 month

## 2016-02-18 NOTE — Assessment & Plan Note (Signed)
Discussed quitting.

## 2016-02-18 NOTE — Progress Notes (Signed)
BP 136/65 (BP Location: Left Arm, Patient Position: Sitting, Cuff Size: Large)   Pulse 77   Temp 98.2 F (36.8 C)   Ht 5' 6.1" (1.679 m)   Wt 185 lb 6.4 oz (84.1 kg)   LMP  (LMP Unknown)   SpO2 93%   BMI 29.83 kg/m    Subjective:    Patient ID: Brandi Steele, female    DOB: 29-Dec-1955, 61 y.o.   MRN: [id number]  HPI: Brandi Steele is a 61 y.o. female  Chief Complaint  Patient presents with  . Depression    4 week f/up   Depression Pt has been on the Zoloft for only 2 weeks due to finances.  Since then, she is better.  She is getting counseling through hospice.  However she is lacking motivation.    Depression screen [facility] 2/9 02/18/2016 01/11/2016 05/28/2015 05/28/2015  Decreased Interest 2 2 0 0  Down, Depressed, Hopeless 1 2 1  0  PHQ - 2 Score 3 4 1  0  Altered sleeping 2 3 - -  Tired, decreased energy 2 3 - -  Change in appetite 2 3 - -  Feeling bad or failure about yourself  2 2 - -  Trouble concentrating 2 3 - -  Moving slowly or fidgety/restless 1 2 - -  Suicidal thoughts 0 0 - -  PHQ-9 Score 14 20 - -   CAD As  Noted on CT.  She has started smoking again and is taking her cholesterol medication.    Right arm Pt with right wrist and hand pain with spasms.     Relevant past medical, surgical, family and social history reviewed and updated as indicated. Interim medical history since our last visit reviewed. Allergies and medications reviewed and updated.  Review of Systems  Per HPI unless specifically indicated above     Objective:    BP 136/65 (BP Location: Left Arm, Patient Position: Sitting, Cuff Size: Large)   Pulse 77   Temp 98.2 F (36.8 C)   Ht 5' 6.1" (1.679 m)   Wt 185 lb 6.4 oz (84.1 kg)   LMP  (LMP Unknown)   SpO2 93%   BMI 29.83 kg/m   Wt Readings from Last 3 Encounters:  02/18/16 185 lb 6.4 oz (84.1 kg)  01/15/16 182 lb (82.6 kg)  01/11/16 187 lb 3.2 oz (84.9 kg)    Physical Exam  Constitutional: She is oriented to person, place,  and time. She appears well-developed and well-nourished. No distress.  HENT:  Head: Normocephalic and atraumatic.  Eyes: Conjunctivae and lids are normal. Right eye exhibits no discharge. Left eye exhibits no discharge. No scleral icterus.  Pulmonary/Chest: Effort normal.  Abdominal: Normal appearance. There is no splenomegaly or hepatomegaly.  Musculoskeletal: Normal range of motion.  Neurological: She is alert and oriented to person, place, and time.  Skin: Skin is intact. No rash noted. No pallor.  Psychiatric: She has a normal mood and affect. Her behavior is normal. Judgment and thought content normal.     Assessment & Plan:   Problem List Items Addressed This Visit      Unprioritized   CAD (coronary artery disease)    Discussed risk factor reduction      Carpal tunnel syndrome of right wrist    Wrist splint at night      Depression    Continue Zoloft.  Look into counseling. Recheck in 1 month      Smoking    Discussed quitting  Follow up plan: Return in about 4 weeks (around 03/17/2016).

## 2016-02-18 NOTE — Patient Instructions (Signed)
Continue Zoloft  Stop smoking  Wrist splint at night

## 2016-03-04 ENCOUNTER — Institutional Professional Consult (permissible substitution): Payer: BLUE CROSS/BLUE SHIELD | Admitting: Internal Medicine

## 2016-03-05 ENCOUNTER — Ambulatory Visit: Payer: BLUE CROSS/BLUE SHIELD | Attending: Internal Medicine

## 2016-03-05 DIAGNOSIS — G4733 Obstructive sleep apnea (adult) (pediatric): Secondary | ICD-10-CM | POA: Diagnosis not present

## 2016-03-05 DIAGNOSIS — G4730 Sleep apnea, unspecified: Secondary | ICD-10-CM | POA: Diagnosis present

## 2016-03-06 ENCOUNTER — Ambulatory Visit: Payer: BLUE CROSS/BLUE SHIELD | Attending: Unknown Physician Specialty

## 2016-03-13 ENCOUNTER — Other Ambulatory Visit: Payer: Self-pay | Admitting: Unknown Physician Specialty

## 2016-03-17 ENCOUNTER — Encounter: Payer: Self-pay | Admitting: Unknown Physician Specialty

## 2016-03-17 ENCOUNTER — Ambulatory Visit (INDEPENDENT_AMBULATORY_CARE_PROVIDER_SITE_OTHER): Payer: BLUE CROSS/BLUE SHIELD | Admitting: Unknown Physician Specialty

## 2016-03-17 VITALS — BP 111/70 | HR 80 | Temp 98.3°F | Wt 186.6 lb

## 2016-03-17 DIAGNOSIS — G4733 Obstructive sleep apnea (adult) (pediatric): Secondary | ICD-10-CM | POA: Diagnosis not present

## 2016-03-17 DIAGNOSIS — F322 Major depressive disorder, single episode, severe without psychotic features: Secondary | ICD-10-CM

## 2016-03-17 DIAGNOSIS — G4730 Sleep apnea, unspecified: Secondary | ICD-10-CM | POA: Insufficient documentation

## 2016-03-17 DIAGNOSIS — Z0000 Encounter for general adult medical examination without abnormal findings: Secondary | ICD-10-CM | POA: Diagnosis not present

## 2016-03-17 DIAGNOSIS — I509 Heart failure, unspecified: Secondary | ICD-10-CM | POA: Diagnosis not present

## 2016-03-17 MED ORDER — SERTRALINE HCL 50 MG PO TABS: 50.0000 mg | ORAL_TABLET | Freq: Every day | ORAL | 1 refills | Status: DC

## 2016-03-17 NOTE — Progress Notes (Signed)
BP 111/70 (BP Location: Left Arm, Patient Position: Sitting, Cuff Size: Normal)   Pulse 80   Temp 98.3 F (36.8 C)   Wt 186 lb 9.6 oz (84.6 kg)   LMP  (LMP Unknown)   SpO2 95%   BMI 30.03 kg/m    Subjective:    Patient ID: Brandi Steele, female    DOB: Dec 06, 1955, 61 y.o.   MRN: [id number]  HPI: ASHELLY Steele is a 61 y.o. female  Chief Complaint  Patient presents with  . Depression    1 month f/up   Depression Taking the Zoloft.  States she has "so much crap in my life."  States she is unable to get counseling due to finances.   Depression screen [facility] 2/9 03/17/2016 02/18/2016 01/11/2016 05/28/2015 05/28/2015  Decreased Interest 2 2 2  0 0  Down, Depressed, Hopeless 2 1 2 1  0  PHQ - 2 Score 4 3 4 1  0  Altered sleeping 2 2 3  - -  Tired, decreased energy 2 2 3  - -  Change in appetite 2 2 3  - -  Feeling bad or failure about yourself  3 2 2  - -  Trouble concentrating 3 2 3  - -  Moving slowly or fidgety/restless 2 1 2  - -  Suicidal thoughts 0 0 0 - -  PHQ-9 Score 18 14 20  - -   Relevant past medical, surgical, family and social history reviewed and updated as indicated. Interim medical history since our last visit reviewed. Allergies and medications reviewed and updated.  Review of Systems  Per HPI unless specifically indicated above     Objective:    BP 111/70 (BP Location: Left Arm, Patient Position: Sitting, Cuff Size: Normal)   Pulse 80   Temp 98.3 F (36.8 C)   Wt 186 lb 9.6 oz (84.6 kg)   LMP  (LMP Unknown)   SpO2 95%   BMI 30.03 kg/m   Wt Readings from Last 3 Encounters:  03/17/16 186 lb 9.6 oz (84.6 kg)  02/18/16 185 lb 6.4 oz (84.1 kg)  01/15/16 182 lb (82.6 kg)    Physical Exam  Constitutional: She is oriented to person, place, and time. She appears well-developed and well-nourished. No distress.  HENT:  Head: Normocephalic and atraumatic.  Eyes: Conjunctivae and lids are normal. Right eye exhibits no discharge. Left eye exhibits no discharge. No  scleral icterus.  Neck: Normal range of motion. Neck supple. No JVD present. Carotid bruit is not present.  Cardiovascular: Normal rate, regular rhythm and normal heart sounds.   Pulmonary/Chest: Effort normal and breath sounds normal.  Abdominal: Normal appearance. There is no splenomegaly or hepatomegaly.  Musculoskeletal: Normal range of motion.  Neurological: She is alert and oriented to person, place, and time.  Skin: Skin is warm, dry and intact. No rash noted. No pallor.  Psychiatric: She has a normal mood and affect. Her behavior is normal. Judgment and thought content normal.      Assessment & Plan:   Problem List Items Addressed This Visit      Unprioritized   CHF (congestive heart failure) ([place])    Per Dr. Nehemiah Massed      Depression    Depression not controlled.  Needs counseling but states she can't afford it.  Discussed need for counseling.        Relevant Medications   sertraline (ZOLOFT) 50 MG tablet   Sleep apnea    Reviewed sleep study.  Discussed needed settings.  Follow up plan: Return in about 3 months (around 06/14/2016).

## 2016-03-17 NOTE — Assessment & Plan Note (Signed)
Per Dr. Nehemiah Massed

## 2016-03-17 NOTE — Assessment & Plan Note (Signed)
Reviewed sleep study.  Discussed needed settings.

## 2016-03-17 NOTE — Assessment & Plan Note (Addendum)
Depression not controlled.  Needs counseling but states she can't afford it.  Discussed need for counseling.

## 2016-03-31 ENCOUNTER — Other Ambulatory Visit: Payer: Self-pay | Admitting: Unknown Physician Specialty

## 2016-04-10 ENCOUNTER — Other Ambulatory Visit: Payer: Self-pay | Admitting: Unknown Physician Specialty

## 2016-04-21 ENCOUNTER — Encounter: Payer: Self-pay | Admitting: Unknown Physician Specialty

## 2016-04-21 ENCOUNTER — Ambulatory Visit (INDEPENDENT_AMBULATORY_CARE_PROVIDER_SITE_OTHER): Payer: BLUE CROSS/BLUE SHIELD | Admitting: Unknown Physician Specialty

## 2016-04-21 VITALS — BP 99/64 | HR 79 | Temp 98.6°F | Wt 187.6 lb

## 2016-04-21 DIAGNOSIS — B9789 Other viral agents as the cause of diseases classified elsewhere: Secondary | ICD-10-CM | POA: Diagnosis not present

## 2016-04-21 DIAGNOSIS — J42 Unspecified chronic bronchitis: Secondary | ICD-10-CM | POA: Diagnosis not present

## 2016-04-21 DIAGNOSIS — J069 Acute upper respiratory infection, unspecified: Secondary | ICD-10-CM

## 2016-04-21 DIAGNOSIS — J209 Acute bronchitis, unspecified: Secondary | ICD-10-CM | POA: Diagnosis not present

## 2016-04-21 DIAGNOSIS — J449 Chronic obstructive pulmonary disease, unspecified: Secondary | ICD-10-CM | POA: Diagnosis not present

## 2016-04-21 MED ORDER — PREDNISONE 20 MG PO TABS: 40.0000 mg | ORAL_TABLET | Freq: Every day | ORAL | 0 refills | Status: DC

## 2016-04-21 MED ORDER — HYDROCOD POLST-CPM POLST ER 10-8 MG/5ML PO SUER: 5.0000 mL | Freq: Two times a day (BID) | ORAL | 0 refills | Status: DC | PRN

## 2016-04-21 MED ORDER — DOXYCYCLINE HYCLATE 100 MG PO TABS: 100.0000 mg | ORAL_TABLET | Freq: Two times a day (BID) | ORAL | 0 refills | Status: DC

## 2016-04-21 NOTE — Progress Notes (Signed)
BP 99/64 (BP Location: Left Arm, Patient Position: Sitting, Cuff Size: Normal)   Pulse 79   Temp 98.6 F (37 C)   Wt 187 lb 9.6 oz (85.1 kg)   LMP  (LMP Unknown)   SpO2 92%   BMI 30.19 kg/m    Subjective:    Patient ID: [name], female    DOB: 12-21-1955, 61 y.o.   MRN: [id number]  HPI: BRANNA CORTINA is a 61 y.o. female  Chief Complaint  Patient presents with  . URI    pt states she had a sore throat Thursday but not now, states she now has cough, runny nose, nasal and chest congestion and body aches. states that these symtoms started Friday.    URI   This is a new problem. Episode onset: 4 days ago. The problem has been rapidly worsening. There has been no fever. Associated symptoms include chest pain, congestion, coughing, rhinorrhea and a sore throat. She has tried decongestant and antihistamine for the symptoms. The treatment provided no relief.   Significant history of COPD  Relevant past medical, surgical, family and social history reviewed and updated as indicated. Interim medical history since our last visit reviewed. Allergies and medications reviewed and updated.  Review of Systems  HENT: Positive for congestion, rhinorrhea and sore throat.   Respiratory: Positive for cough.   Cardiovascular: Positive for chest pain.    Per HPI unless specifically indicated above     Objective:    BP 99/64 (BP Location: Left Arm, Patient Position: Sitting, Cuff Size: Normal)   Pulse 79   Temp 98.6 F (37 C)   Wt 187 lb 9.6 oz (85.1 kg)   LMP  (LMP Unknown)   SpO2 92%   BMI 30.19 kg/m   Wt Readings from Last 3 Encounters:  04/21/16 187 lb 9.6 oz (85.1 kg)  03/17/16 186 lb 9.6 oz (84.6 kg)  02/18/16 185 lb 6.4 oz (84.1 kg)    Physical Exam  Constitutional: She is oriented to person, place, and time. She appears well-developed and well-nourished. No distress.  HENT:  Head: Normocephalic and atraumatic.  Right Ear: Tympanic membrane and ear canal normal.    Left Ear: Tympanic membrane and ear canal normal.  Nose: Rhinorrhea present. Right sinus exhibits no maxillary sinus tenderness and no frontal sinus tenderness. Left sinus exhibits no maxillary sinus tenderness and no frontal sinus tenderness.  Mouth/Throat: Mucous membranes are normal. Posterior oropharyngeal erythema present.  Eyes: Conjunctivae and lids are normal. Right eye exhibits no discharge. Left eye exhibits no discharge. No scleral icterus.  Cardiovascular: Normal rate and regular rhythm.   Pulmonary/Chest: Effort normal. No respiratory distress. She has decreased breath sounds. She has no wheezes. She has no rhonchi. She has no rales.  Abdominal: Normal appearance. There is no splenomegaly or hepatomegaly.  Musculoskeletal: Normal range of motion.  Neurological: She is alert and oriented to person, place, and time.  Skin: Skin is intact. No rash noted. No pallor.  Psychiatric: She has a normal mood and affect. Her behavior is normal. Judgment and thought content normal.    Results for orders placed or performed in visit on 10/24/15  CBC With Differential/Platelet  Result Value Ref Range   WBC 10.0 3.4 - 10.8 x10E3/uL   RBC 4.82 3.77 - 5.28 x10E6/uL   Hemoglobin 14.9 11.1 - 15.9 g/dL   Hematocrit 45.3 34.0 - 46.6 %   MCV 94 79 - 97 fL   MCH 30.9 26.6 - 33.0 pg  MCHC 32.9 31.5 - 35.7 g/dL   RDW 15.6 (H) 12.3 - 15.4 %   Platelets 149 (L) 150 - 379 x10E3/uL   Neutrophils 76 %   Lymphs 22 %   MID 3 %   Neutrophils Absolute 7.5 (H) 1.4 - 7.0 x10E3/uL   Lymphocytes Absolute 2.2 0.7 - 3.1 x10E3/uL   MID (Absolute) 0.3 0.1 - 1.6 X10E3/uL      Assessment & Plan:   Problem List Items Addressed This Visit      Unprioritized   COPD (chronic obstructive pulmonary disease) (HCC)    Acute exacerbation with pulse ox 92%.  Increase inhaler use.  Short term rx for Prednisone.        Relevant Medications   predniSONE (DELTASONE) 20 MG tablet    Other Visit Diagnoses    Acute  exacerbation of chronic bronchitis ([place])    -  Primary   Rx for Doxycycline and Prednisone.  Will increase inhaler use to 4-6 times/day   Relevant Medications   doxycycline (VIBRA-TABS) 100 MG tablet   predniSONE (DELTASONE) 20 MG tablet   Viral upper respiratory tract infection       Pt ed on supportive care       Follow up plan: Return if symptoms worsen or fail to improve.

## 2016-04-21 NOTE — Assessment & Plan Note (Addendum)
Acute exacerbation with pulse ox 92%.  Increase inhaler use.  Short term rx for Prednisone.

## 2016-04-28 ENCOUNTER — Telehealth: Payer: Self-pay | Admitting: Unknown Physician Specialty

## 2016-04-28 DIAGNOSIS — I110 Hypertensive heart disease with heart failure: Secondary | ICD-10-CM

## 2016-04-28 DIAGNOSIS — I10 Essential (primary) hypertension: Secondary | ICD-10-CM

## 2016-04-28 NOTE — Telephone Encounter (Signed)
Called and let patient know that labs were in and that she could stop by to have them drawn at any time. Patient verbalized understanding.

## 2016-04-28 NOTE — Telephone Encounter (Signed)
OK, labs in chart

## 2016-04-28 NOTE — Telephone Encounter (Signed)
Called and spoke to patient. I let her know what Brandi Steele said. Patient states that this is not something new for her and that this has happened to her in the past and either her potassium or magnesium was low when it has happened before. Patient requests to just have these labs checked since it is something she has experienced before. I told her I would ask Brandi Steele and see what she says.

## 2016-04-28 NOTE — Telephone Encounter (Signed)
Called and left patient a VM asking for patient to please give me a call as soon as she got my message.

## 2016-04-28 NOTE — Telephone Encounter (Signed)
This is an emergency visit needed to a optometrist or opthamologist

## 2016-04-28 NOTE — Telephone Encounter (Signed)
Pt called and stated she was seeing flashing lights with her eyes open and closed and feels like this may be because her magnesium or potassium are getting low and she would like to have labs done.

## 2016-04-28 NOTE — Telephone Encounter (Signed)
Routing to provider  

## 2016-04-29 ENCOUNTER — Encounter: Payer: Self-pay | Admitting: Unknown Physician Specialty

## 2016-04-29 ENCOUNTER — Ambulatory Visit (INDEPENDENT_AMBULATORY_CARE_PROVIDER_SITE_OTHER): Payer: BLUE CROSS/BLUE SHIELD | Admitting: Unknown Physician Specialty

## 2016-04-29 VITALS — BP 99/66 | HR 68 | Temp 98.2°F | Wt 187.0 lb

## 2016-04-29 DIAGNOSIS — R05 Cough: Secondary | ICD-10-CM | POA: Diagnosis not present

## 2016-04-29 DIAGNOSIS — E860 Dehydration: Secondary | ICD-10-CM | POA: Insufficient documentation

## 2016-04-29 DIAGNOSIS — I10 Essential (primary) hypertension: Secondary | ICD-10-CM

## 2016-04-29 DIAGNOSIS — R059 Cough, unspecified: Secondary | ICD-10-CM

## 2016-04-29 DIAGNOSIS — R0902 Hypoxemia: Secondary | ICD-10-CM | POA: Diagnosis not present

## 2016-04-29 DIAGNOSIS — J449 Chronic obstructive pulmonary disease, unspecified: Secondary | ICD-10-CM | POA: Diagnosis not present

## 2016-04-29 LAB — CBC WITH DIFFERENTIAL/PLATELET
Hematocrit: 41.9 % (ref 34.0–46.6)
Hemoglobin: 13.7 g/dL (ref 11.1–15.9)
LYMPHS: 25 %
Lymphocytes Absolute: 2.2 10*3/uL (ref 0.7–3.1)
MCH: 30.3 pg (ref 26.6–33.0)
MCHC: 32.7 g/dL (ref 31.5–35.7)
MCV: 93 fL (ref 79–97)
MID (ABSOLUTE): 0.4 10*3/uL (ref 0.1–1.6)
MID: 4 %
Neutrophils Absolute: 6.3 10*3/uL (ref 1.4–7.0)
Neutrophils: 71 %
Platelets: 134 10*3/uL — ABNORMAL LOW (ref 150–379)
RBC: 4.52 x10E6/uL (ref 3.77–5.28)
RDW: 15.4 % (ref 12.3–15.4)
WBC: 8.9 10*3/uL (ref 3.4–10.8)

## 2016-04-29 MED ORDER — BUDESONIDE-FORMOTEROL FUMARATE 160-4.5 MCG/ACT IN AERO: 2.0000 | INHALATION_SPRAY | Freq: Two times a day (BID) | RESPIRATORY_TRACT | 1 refills | Status: DC

## 2016-04-29 NOTE — Progress Notes (Signed)
BP 99/66 (BP Location: Left Arm, Patient Position: Sitting, Cuff Size: Normal)   Pulse 68   Temp 98.2 F (36.8 C)   Wt 187 lb (84.8 kg)   LMP  (LMP Unknown)   SpO2 95%   BMI 30.09 kg/m    Subjective:    Patient ID: Brandi Steele, female    DOB: December 29, 1955, 61 y.o.   MRN: [id number]  HPI: Brandi Steele is a 61 y.o. female  Chief Complaint  Patient presents with  . URI    pt states she is no better from last week, states she finished the prednisone, takes the cough syrup at night if needed, and has about 3 days left of doxycycline.    Cough  Chronicity: No better since seen in the past. Episode onset: Received doxycycline and prednisone previously. The problem has been gradually worsening. The problem occurs constantly. The cough is productive of sputum. Associated symptoms include chills, ear congestion, ear pain, headaches, myalgias and nasal congestion. Associated symptoms comments: Neck swelling and bright flashes of light that has happened before when her Magnesium is low. Nothing (up and down) aggravates the symptoms. She has tried a beta-agonist inhaler and prescription cough suppressant for the symptoms. Her past medical history is significant for COPD.   Still has doxycycline left over.    O2 therapy Pt states she uses a oxygen concentrator at night and sometimes during the day when her "oxygen drops out."    Relevant past medical, surgical, family and social history reviewed and updated as indicated. Interim medical history since our last visit reviewed. Allergies and medications reviewed and updated.  Review of Systems  Constitutional: Positive for chills.  HENT: Positive for ear pain.        Problems with ear wax  Respiratory: Positive for cough.   Musculoskeletal: Positive for myalgias.  Neurological: Positive for headaches.    Per HPI unless specifically indicated above     Objective:    BP 99/66 (BP Location: Left Arm, Patient Position: Sitting, Cuff  Size: Normal)   Pulse 68   Temp 98.2 F (36.8 C)   Wt 187 lb (84.8 kg)   LMP  (LMP Unknown)   SpO2 95%   BMI 30.09 kg/m   Wt Readings from Last 3 Encounters:  04/29/16 187 lb (84.8 kg)  04/21/16 187 lb 9.6 oz (85.1 kg)  03/17/16 186 lb 9.6 oz (84.6 kg)    Physical Exam  Constitutional: She is oriented to person, place, and time. She appears well-developed and well-nourished. No distress.  HENT:  Head: Normocephalic and atraumatic.  Right Ear: Tympanic membrane and ear canal normal.  Left Ear: Tympanic membrane and ear canal normal.  Nose: Rhinorrhea present. Right sinus exhibits no maxillary sinus tenderness and no frontal sinus tenderness. Left sinus exhibits no maxillary sinus tenderness and no frontal sinus tenderness.  Mouth/Throat: Mucous membranes are normal. Posterior oropharyngeal erythema present.  Eyes: Conjunctivae and lids are normal. Right eye exhibits no discharge. Left eye exhibits no discharge. No scleral icterus.  Cardiovascular: Normal rate and regular rhythm.   Pulmonary/Chest: Effort normal and breath sounds normal. No respiratory distress.  Abdominal: Normal appearance. There is no splenomegaly or hepatomegaly.  Musculoskeletal: Normal range of motion.  Neurological: She is alert and oriented to person, place, and time.  Skin: Skin is intact. No rash noted. No pallor.  Psychiatric: She has a normal mood and affect. Her behavior is normal. Judgment and thought content normal.    Results for  orders placed or performed in visit on 10/24/15  CBC With Differential/Platelet  Result Value Ref Range   WBC 10.0 3.4 - 10.8 x10E3/uL   RBC 4.82 3.77 - 5.28 x10E6/uL   Hemoglobin 14.9 11.1 - 15.9 g/dL   Hematocrit 45.3 34.0 - 46.6 %   MCV 94 79 - 97 fL   MCH 30.9 26.6 - 33.0 pg   MCHC 32.9 31.5 - 35.7 g/dL   RDW 15.6 (H) 12.3 - 15.4 %   Platelets 149 (L) 150 - 379 x10E3/uL   Neutrophils 76 %   Lymphs 22 %   MID 3 %   Neutrophils Absolute 7.5 (H) 1.4 - 7.0  x10E3/uL   Lymphocytes Absolute 2.2 0.7 - 3.1 x10E3/uL   MID (Absolute) 0.3 0.1 - 1.6 X10E3/uL      Assessment & Plan:   Problem List Items Addressed This Visit      Unprioritized   COPD (chronic obstructive pulmonary disease) ([place]) - Primary    Start Symbicort 2 puffs twice a day      Relevant Medications   budesonide-formoterol (SYMBICORT) 160-4.5 MCG/ACT inhaler   Other Relevant Orders   DG Chest 2 View   Dehydration    Pt orthostatic with SBP dropping from 99-80 when standing with complaints of dizzyness and palpitations.  Stop Lasix 40 mg for 2 days.  Restart on 20 mg for another few days before increasing to 40 mg.  Consider referral to pulmonary      Essential hypertension    DC Lasix for 2-3 days.      Hypoxia    Pt needs oxygen concentrator at times.  She will call the hospital where she got her CPAP supplies.  Consider referral to pulmonology       Other Visit Diagnoses    Cough       CBC is normal.  Continue Doxycycline till finished.  Get chest x-ray.  Start Symbicort 120 2 puffs twice a day.     Relevant Orders   DG Chest 2 View   CBC With Differential/Platelet      Discussed and adjusted inhaler technique.  Written instructions given.    Follow up plan: Return for recheck on Friday.

## 2016-04-29 NOTE — Assessment & Plan Note (Signed)
Start Symbicort 2 puffs twice a day

## 2016-04-29 NOTE — Patient Instructions (Addendum)
Start Symbicort 2 puffs twice a day.  Continue to use Proair every 4-6 hours as needed  Continue Doxycycline  Stop Lasix 40 mg for 2 days and restart at 20 mg.

## 2016-04-29 NOTE — Assessment & Plan Note (Addendum)
Pt needs oxygen concentrator at times.  She will call the hospital where she got her CPAP supplies.  Consider referral to pulmonology

## 2016-04-29 NOTE — Assessment & Plan Note (Signed)
DC Lasix for 2-3 days.

## 2016-04-29 NOTE — Assessment & Plan Note (Addendum)
Pt orthostatic with SBP dropping from 99-80 when standing with complaints of dizzyness and palpitations.  Stop Lasix 40 mg for 2 days.  Restart on 20 mg for another few days before increasing to 40 mg.  Consider referral to pulmonary

## 2016-04-30 ENCOUNTER — Other Ambulatory Visit: Payer: Self-pay | Admitting: Unknown Physician Specialty

## 2016-04-30 LAB — MAGNESIUM: MAGNESIUM: 1 mg/dL — AB (ref 1.6–2.3)

## 2016-04-30 LAB — BASIC METABOLIC PANEL
BUN/Creatinine Ratio: 13 (ref 12–28)
BUN: 10 mg/dL (ref 8–27)
CALCIUM: 7.5 mg/dL — AB (ref 8.7–10.3)
CHLORIDE: 100 mmol/L (ref 96–106)
CO2: 26 mmol/L (ref 18–29)
Creatinine, Ser: 0.78 mg/dL (ref 0.57–1.00)
GFR calc Af Amer: 94 mL/min/{1.73_m2} (ref >59)
GFR calc non Af Amer: 82 mL/min/{1.73_m2} (ref >59)
GLUCOSE: 92 mg/dL (ref 65–99)
Potassium: 3.9 mmol/L (ref 3.5–5.2)
Sodium: 141 mmol/L (ref 134–144)

## 2016-05-01 ENCOUNTER — Ambulatory Visit
Admission: RE | Admit: 2016-05-01 | Discharge: 2016-05-01 | Disposition: A | Discharge status: Home / Self Care | Payer: BLUE CROSS/BLUE SHIELD | Source: Ambulatory Visit | Attending: Unknown Physician Specialty | Admitting: Unknown Physician Specialty

## 2016-05-01 ENCOUNTER — Other Ambulatory Visit: Payer: BLUE CROSS/BLUE SHIELD

## 2016-05-01 DIAGNOSIS — I700 Atherosclerosis of aorta: Secondary | ICD-10-CM | POA: Diagnosis not present

## 2016-05-01 DIAGNOSIS — J449 Chronic obstructive pulmonary disease, unspecified: Secondary | ICD-10-CM | POA: Insufficient documentation

## 2016-05-01 DIAGNOSIS — R059 Cough, unspecified: Secondary | ICD-10-CM

## 2016-05-01 DIAGNOSIS — R05 Cough: Secondary | ICD-10-CM

## 2016-05-02 ENCOUNTER — Ambulatory Visit (INDEPENDENT_AMBULATORY_CARE_PROVIDER_SITE_OTHER): Payer: BLUE CROSS/BLUE SHIELD | Admitting: Unknown Physician Specialty

## 2016-05-02 ENCOUNTER — Encounter: Payer: Self-pay | Admitting: Unknown Physician Specialty

## 2016-05-02 DIAGNOSIS — I509 Heart failure, unspecified: Secondary | ICD-10-CM

## 2016-05-02 DIAGNOSIS — I10 Essential (primary) hypertension: Secondary | ICD-10-CM

## 2016-05-02 DIAGNOSIS — F322 Major depressive disorder, single episode, severe without psychotic features: Secondary | ICD-10-CM

## 2016-05-02 LAB — PTH, INTACT AND CALCIUM
Calcium: 7.4 mg/dL — ABNORMAL LOW (ref 8.7–10.3)
PTH: 41 pg/mL (ref 15–65)

## 2016-05-02 NOTE — Assessment & Plan Note (Signed)
Check phosphorous and albumin with CMP

## 2016-05-02 NOTE — Progress Notes (Signed)
BP 127/77 (BP Location: Left Arm, Patient Position: Sitting, Cuff Size: Large)   Pulse 69   Temp 98.4 F (36.9 C)   Wt 187 lb 9.6 oz (85.1 kg)   LMP  (LMP Unknown)   SpO2 91%   BMI 30.19 kg/m    Subjective:    Patient ID: Brandi Steele, female    DOB: 1955-12-18, 61 y.o.   MRN: [id number]  HPI: Brandi Steele is a 61 y.o. female  Chief Complaint  Patient presents with  . Follow-up   Pt is here to f/u from feeling ill, see previous note.  We found she was dehydrated at the time with low magnesium and calcium.  We dc'd Furosemide 40 mg for 2 days.  She has restarted it at 20 mg.  She started Calcium supplements for hypocalcemia and increased Magesium supplements twice a day. PTH was normal.      Today she feels better with the following symptoms:   Right ear and jaw pain - this has improved with taking Clariton.   Fatigue - improved.  Was able to get up and do errands.  She is packing and ready to go on vacation soon.    Depression - Seeing her therapist.  Diagnosed with PTSD.  She is in a treatment program.    Relevant past medical, surgical, family and social history reviewed and updated as indicated. Interim medical history since our last visit reviewed. Allergies and medications reviewed and updated.  Review of Systems  Per HPI unless specifically indicated above     Objective:    BP 127/77 (BP Location: Left Arm, Patient Position: Sitting, Cuff Size: Large)   Pulse 69   Temp 98.4 F (36.9 C)   Wt 187 lb 9.6 oz (85.1 kg)   LMP  (LMP Unknown)   SpO2 91%   BMI 30.19 kg/m   Wt Readings from Last 3 Encounters:  05/02/16 187 lb 9.6 oz (85.1 kg)  04/29/16 187 lb (84.8 kg)  04/21/16 187 lb 9.6 oz (85.1 kg)    Physical Exam  Constitutional: She is oriented to person, place, and time. She appears well-developed and well-nourished. No distress.  HENT:  Head: Normocephalic and atraumatic.  Eyes: Conjunctivae and lids are normal. Right eye exhibits no discharge.  Left eye exhibits no discharge. No scleral icterus.  Neck: Normal range of motion. Neck supple. No JVD present. Carotid bruit is not present.  Cardiovascular: Normal rate, regular rhythm and normal heart sounds.   Pulmonary/Chest: Effort normal and breath sounds normal.  Abdominal: Normal appearance. There is no splenomegaly or hepatomegaly.  Musculoskeletal: Normal range of motion.  Neurological: She is alert and oriented to person, place, and time.  Skin: Skin is warm, dry and intact. No rash noted. No pallor.  Psychiatric: She has a normal mood and affect. Her behavior is normal. Judgment and thought content normal.    Results for orders placed or performed in visit on 05/01/16  PTH, Intact and Calcium  Result Value Ref Range   Calcium 7.4 (L) 8.7 - 10.3 mg/dL   PTH 41 15 - 65 pg/mL   PTH Comment       Assessment & Plan:   Problem List Items Addressed This Visit      Unprioritized   CHF (congestive heart failure) (HCC)    Decrease Furosemide to 20 mg daily.  Continue Lisinopril that she asked to come off for CHF maintenance.  Last echo was normal EF  Depression    PTSD.  Continue with Zoloft and therapy      Essential hypertension    Stable, continue present medications.        Hypocalcemia    Check phosphorous and albumin with CMP      Relevant Orders   Comprehensive metabolic panel   Phosphorus   Hypomagnesemia    Continue with supplementation.  I think her magnesium and calcium are low due to heavy diuresis      Relevant Orders   Comprehensive metabolic panel   Phosphorus       Follow up plan: Return in about 4 weeks (around 05/30/2016).

## 2016-05-02 NOTE — Assessment & Plan Note (Addendum)
Decrease Furosemide to 20 mg daily.  Continue Lisinopril that she asked to come off for CHF maintenance.  Last echo was normal EF

## 2016-05-02 NOTE — Assessment & Plan Note (Signed)
Continue with supplementation.  I think her magnesium and calcium are low due to heavy diuresis

## 2016-05-02 NOTE — Assessment & Plan Note (Signed)
PTSD.  Continue with Zoloft and therapy

## 2016-05-02 NOTE — Assessment & Plan Note (Signed)
Stable, continue present medications.   

## 2016-05-03 LAB — COMPREHENSIVE METABOLIC PANEL
ALBUMIN: 4.1 g/dL (ref 3.6–4.8)
ALK PHOS: 60 IU/L (ref 39–117)
ALT: 17 IU/L (ref 0–32)
AST: 16 IU/L (ref 0–40)
Albumin/Globulin Ratio: 1.9 (ref 1.2–2.2)
BUN / CREAT RATIO: 10 — AB (ref 12–28)
BUN: 7 mg/dL — ABNORMAL LOW (ref 8–27)
Bilirubin Total: 0.2 mg/dL (ref 0.0–1.2)
CO2: 27 mmol/L (ref 18–29)
CREATININE: 0.73 mg/dL (ref 0.57–1.00)
Calcium: 8.1 mg/dL — ABNORMAL LOW (ref 8.7–10.3)
Chloride: 103 mmol/L (ref 96–106)
GFR calc Af Amer: 102 mL/min/{1.73_m2} (ref >59)
GFR, EST NON AFRICAN AMERICAN: 89 mL/min/{1.73_m2} (ref >59)
GLOBULIN, TOTAL: 2.2 g/dL (ref 1.5–4.5)
Glucose: 88 mg/dL (ref 65–99)
POTASSIUM: 4 mmol/L (ref 3.5–5.2)
SODIUM: 145 mmol/L — AB (ref 134–144)
Total Protein: 6.3 g/dL (ref 6.0–8.5)

## 2016-05-03 LAB — PHOSPHORUS: PHOSPHORUS: 2.6 mg/dL (ref 2.5–4.5)

## 2016-05-12 ENCOUNTER — Ambulatory Visit (INDEPENDENT_AMBULATORY_CARE_PROVIDER_SITE_OTHER): Payer: BLUE CROSS/BLUE SHIELD | Admitting: Unknown Physician Specialty

## 2016-05-12 ENCOUNTER — Encounter: Payer: Self-pay | Admitting: Unknown Physician Specialty

## 2016-05-12 VITALS — BP 151/77 | HR 96 | Temp 97.7°F | Wt 184.8 lb

## 2016-05-12 DIAGNOSIS — M545 Low back pain: Secondary | ICD-10-CM | POA: Diagnosis not present

## 2016-05-12 DIAGNOSIS — M797 Fibromyalgia: Secondary | ICD-10-CM

## 2016-05-12 MED ORDER — CYCLOBENZAPRINE HCL 10 MG PO TABS
10.0000 mg | ORAL_TABLET | Freq: Three times a day (TID) | ORAL | 0 refills | Status: DC | PRN
Start: 2016-05-12 — End: 2016-12-26

## 2016-05-12 MED ORDER — MELOXICAM 15 MG PO TABS: 15.0000 mg | ORAL_TABLET | Freq: Every day | ORAL | 0 refills | Status: DC

## 2016-05-12 NOTE — Patient Instructions (Addendum)
Back Exercises The following exercises strengthen the muscles that help to support the back. They also help to keep the lower back flexible. Doing these exercises can help to prevent back pain or lessen existing pain. If you have back pain or discomfort, try doing these exercises 2-3 times each day or as told by your health care provider. When the pain goes away, do them once each day, but increase the number of times that you repeat the steps for each exercise (do more repetitions). If you do not have back pain or discomfort, do these exercises once each day or as told by your health care provider. Exercises Single Knee to Chest   Repeat these steps 3-5 times for each leg: 1. Lie on your back on a firm bed or the floor with your legs extended. 2. Bring one knee to your chest. Your other leg should stay extended and in contact with the floor. 3. Hold your knee in place by grabbing your knee or thigh. 4. Pull on your knee until you feel a gentle stretch in your lower back. 5. Hold the stretch for 10-30 seconds. 6. Slowly release and straighten your leg. Pelvic Tilt   Repeat these steps 5-10 times: 1. Lie on your back on a firm bed or the floor with your legs extended. 2. Bend your knees so they are pointing toward the ceiling and your feet are flat on the floor. 3. Tighten your lower abdominal muscles to press your lower back against the floor. This motion will tilt your pelvis so your tailbone points up toward the ceiling instead of pointing to your feet or the floor. 4. With gentle tension and even breathing, hold this position for 5-10 seconds. Cat-Cow   Repeat these steps until your lower back becomes more flexible: 1. Get into a hands-and-knees position on a firm surface. Keep your hands under your shoulders, and keep your knees under your hips. You may place padding under your knees for comfort. 2. Let your head hang down, and point your tailbone toward the floor so your lower back  becomes rounded like the back of a cat. 3. Hold this position for 5 seconds. 4. Slowly lift your head and point your tailbone up toward the ceiling so your back forms a sagging arch like the back of a cow. 5. Hold this position for 5 seconds. Press-Ups   Repeat these steps 5-10 times: 1. Lie on your abdomen (face-down) on the floor. 2. Place your palms near your head, about shoulder-width apart. 3. While you keep your back as relaxed as possible and keep your hips on the floor, slowly straighten your arms to raise the top half of your body and lift your shoulders. Do not use your back muscles to raise your upper torso. You may adjust the placement of your hands to make yourself more comfortable. 4. Hold this position for 5 seconds while you keep your back relaxed. 5. Slowly return to lying flat on the floor. Bridges   Repeat these steps 10 times: 1. Lie on your back on a firm surface. 2. Bend your knees so they are pointing toward the ceiling and your feet are flat on the floor. 3. Tighten your buttocks muscles and lift your buttocks off of the floor until your waist is at almost the same height as your knees. You should feel the muscles working in your buttocks and the back of your thighs. If you do not feel these muscles, slide your feet 1-2 inches farther  away from your buttocks. 4. Hold this position for 3-5 seconds. 5. Slowly lower your hips to the starting position, and allow your buttocks muscles to relax completely. If this exercise is too easy, try doing it with your arms crossed over your chest. Abdominal Crunches   Repeat these steps 5-10 times: 1. Lie on your back on a firm bed or the floor with your legs extended. 2. Bend your knees so they are pointing toward the ceiling and your feet are flat on the floor. 3. Cross your arms over your chest. 4. Tip your chin slightly toward your chest without bending your neck. 5. Tighten your abdominal muscles and slowly raise your trunk  (torso) high enough to lift your shoulder blades a tiny bit off of the floor. Avoid raising your torso higher than that, because it can put too much stress on your low back and it does not help to strengthen your abdominal muscles. 6. Slowly return to your starting position. Back Lifts  Repeat these steps 5-10 times: 1. Lie on your abdomen (face-down) with your arms at your sides, and rest your forehead on the floor. 2. Tighten the muscles in your legs and your buttocks. 3. Slowly lift your chest off of the floor while you keep your hips pressed to the floor. Keep the back of your head in line with the curve in your back. Your eyes should be looking at the floor. 4. Hold this position for 3-5 seconds. 5. Slowly return to your starting position. Contact a health care provider if:  Your back pain or discomfort gets much worse when you do an exercise.  Your back pain or discomfort does not lessen within 2 hours after you exercise. If you have any of these problems, stop doing these exercises right away. Do not do them again unless your health care provider says that you can. Get help right away if:  You develop sudden, severe back pain. If this happens, stop doing the exercises right away. Do not do them again unless your health care provider says that you can. This information is not intended to replace advice given to you by your health care provider. Make sure you discuss any questions you have with your health care provider. Document Released: 02/21/2004 Document Revised: 05/23/2015 Document Reviewed: 03/09/2014 Elsevier Interactive Patient Education  2017 [employer]  Back Pain, Adult Back pain is very common. The pain often gets better over time. The cause of back pain is usually not dangerous. Most people can learn to manage their back pain on their own. Follow these instructions at home: Watch your back pain for any changes. The following actions may help to lessen any pain you are  feeling:  Stay active. Start with short walks on flat ground if you can. Try to walk farther each day.  Exercise regularly as told by your doctor. Exercise helps your back heal faster. It also helps avoid future injury by keeping your muscles strong and flexible.  Do not sit, drive, or stand in one place for more than 30 minutes.  Do not stay in bed. Resting more than 1-2 days can slow down your recovery.  Be careful when you bend or lift an object. Use good form when lifting:  Bend at your knees.  Keep the object close to your body.  Do not twist.  Sleep on a firm mattress. Lie on your side, and bend your knees. If you lie on your back, put a pillow under your knees.  Take medicines only as told by your doctor.  Put ice on the injured area.  Put ice in a plastic bag.  Place a towel between your skin and the bag.  Leave the ice on for 20 minutes, 2-3 times a day for the first 2-3 days. After that, you can switch between ice and heat packs.  Avoid feeling anxious or stressed. Find good ways to deal with stress, such as exercise.  Maintain a healthy weight. Extra weight puts stress on your back. Contact a doctor if:  You have pain that does not go away with rest or medicine.  You have worsening pain that goes down into your legs or buttocks.  You have pain that does not get better in one week.  You have pain at night.  You lose weight.  You have a fever or chills. Get help right away if:  You cannot control when you poop (bowel movement) or pee (urinate).  Your arms or legs feel weak.  Your arms or legs lose feeling (numbness).  You feel sick to your stomach (nauseous) or throw up (vomit).  You have belly (abdominal) pain.  You feel like you may pass out (faint). This information is not intended to replace advice given to you by your health care provider. Make sure you discuss any questions you have with your health care provider. Document Released:  07/02/2007 Document Revised: 06/21/2015 Document Reviewed: 05/17/2013 Elsevier Interactive Patient Education  2017 [employer].

## 2016-05-12 NOTE — Assessment & Plan Note (Signed)
Back pain complicated by history of Fibromyalgia

## 2016-05-12 NOTE — Progress Notes (Signed)
BP (!) 151/77 (BP Location: Left Arm, Patient Position: Sitting, Cuff Size: Large)   Pulse 96   Temp 97.7 F (36.5 C)   Wt 184 lb 12.8 oz (83.8 kg)   LMP  (LMP Unknown)   SpO2 92%   BMI 29.74 kg/m    Subjective:    Patient ID: Brandi Steele, female    DOB: 27-Apr-1955, 61 y.o.   MRN: [id number]  HPI: Brandi Steele is a 61 y.o. female  Chief Complaint  Patient presents with  . Back Pain    pt states her lower back on the right side has been hurting for a little over a week now.    Back Pain  This is a new problem. Episode onset: 9 days. The problem occurs constantly. The problem is unchanged. The quality of the pain is described as aching. Radiates to: radiates to the right side. The pain is severe. Worse during: worse when Tylenol wears off. Exacerbated by: bending over helps. Stiffness is present all day. Pertinent negatives include no abdominal pain, bladder incontinence, bowel incontinence, chest pain, dysuria, fever, headaches, leg pain, numbness, paresis, paresthesias, pelvic pain, perianal numbness, tingling, weakness or weight loss. Risk factors include lack of exercise and obesity. She has tried analgesics for the symptoms. The treatment provided mild relief.   Past Medical History:  Diagnosis Date  . Anxiety   . Aortic stenosis   . CHF (congestive heart failure) ([place])   . COPD (chronic obstructive pulmonary disease) ([place])   . Depression   . Fibromyalgia   . GERD (gastroesophageal reflux disease)   . Hyperlipidemia   . Hypertension   . Hypertensive heart disease   . Myalgia   . Obesity   . Sciatica   . Seborrheic keratosis   . Stress incontinence     Family History  Problem Relation Age of Onset  . Stroke Mother   . Hypertension Mother   . Heart disease Father   . Diabetes Brother   . Stroke Maternal Grandmother   . Stroke Maternal Grandfather   . Heart disease Paternal Grandmother   . Heart disease Paternal Grandfather   . Breast cancer Neg Hx     Social History   Social History  . Marital status: Married    Spouse name: N/A  . Number of children: N/A  . Years of education: N/A   Occupational History  . Not on file.   Social History Main Topics  . Smoking status: Current Every Day Smoker    Packs/day: 0.75    Years: 40.00    Types: Cigarettes  . Smokeless tobacco: Never Used  . Alcohol use No  . Drug use: No  . Sexual activity: No   Other Topics Concern  . Not on file   Social History Narrative  . No narrative on file    Relevant past medical, surgical, family and social history reviewed and updated as indicated. Interim medical history since our last visit reviewed. Allergies and medications reviewed and updated.  Review of Systems  Constitutional: Negative for fever and weight loss.  Cardiovascular: Negative for chest pain.  Gastrointestinal: Negative for abdominal pain and bowel incontinence.  Genitourinary: Negative for bladder incontinence, dysuria and pelvic pain.  Musculoskeletal: Positive for back pain.  Neurological: Negative for tingling, weakness, numbness, headaches and paresthesias.    Per HPI unless specifically indicated above     Objective:    BP (!) 151/77 (BP Location: Left Arm, Patient Position: Sitting, Cuff Size: Large)  Pulse 96   Temp 97.7 F (36.5 C)   Wt 184 lb 12.8 oz (83.8 kg)   LMP  (LMP Unknown)   SpO2 92%   BMI 29.74 kg/m   Wt Readings from Last 3 Encounters:  05/12/16 184 lb 12.8 oz (83.8 kg)  05/02/16 187 lb 9.6 oz (85.1 kg)  04/29/16 187 lb (84.8 kg)    Physical Exam  Constitutional: She is oriented to person, place, and time. She appears well-developed and well-nourished. No distress.  HENT:  Head: Normocephalic and atraumatic.  Eyes: Conjunctivae and lids are normal. Right eye exhibits no discharge. Left eye exhibits no discharge. No scleral icterus.  Neck: Normal range of motion. Neck supple. No JVD present. Carotid bruit is not present.  Cardiovascular:  Normal rate, regular rhythm and normal heart sounds.   Pulmonary/Chest: Effort normal and breath sounds normal.  Abdominal: Normal appearance. There is no splenomegaly or hepatomegaly.  Musculoskeletal: Normal range of motion.       Lumbar back: She exhibits tenderness, swelling and pain. She exhibits normal range of motion, no bony tenderness, no edema, no deformity, no laceration, no spasm and normal pulse.  Neurological: She is alert and oriented to person, place, and time.  Skin: Skin is warm, dry and intact. No rash noted. No pallor.  Psychiatric: She has a normal mood and affect. Her behavior is normal. Judgment and thought content normal.    Results for orders placed or performed in visit on 05/02/16  Comprehensive metabolic panel  Result Value Ref Range   Glucose 88 65 - 99 mg/dL   BUN 7 (L) 8 - 27 mg/dL   Creatinine, Ser 0.73 0.57 - 1.00 mg/dL   GFR calc non Af Amer 89 >59 mL/min/1.73   GFR calc Af Amer 102 >59 mL/min/1.73   BUN/Creatinine Ratio 10 (L) 12 - 28   Sodium 145 (H) 134 - 144 mmol/L   Potassium 4.0 3.5 - 5.2 mmol/L   Chloride 103 96 - 106 mmol/L   CO2 27 18 - 29 mmol/L   Calcium 8.1 (L) 8.7 - 10.3 mg/dL   Total Protein 6.3 6.0 - 8.5 g/dL   Albumin 4.1 3.6 - 4.8 g/dL   Globulin, Total 2.2 1.5 - 4.5 g/dL   Albumin/Globulin Ratio 1.9 1.2 - 2.2   Bilirubin Total 0.2 0.0 - 1.2 mg/dL   Alkaline Phosphatase 60 39 - 117 IU/L   AST 16 0 - 40 IU/L   ALT 17 0 - 32 IU/L  Phosphorus  Result Value Ref Range   Phosphorus 2.6 2.5 - 4.5 mg/dL      Assessment & Plan:   Problem List Items Addressed This Visit      Unprioritized   Fibromyalgia    Back pain complicated by history of Fibromyalgia       Other Visit Diagnoses    Right low back pain, unspecified chronicity, with sciatica presence unspecified    -  Primary   New problem.  Pt ed on trajectory and exercises given.  Discussed yoga for back pain. X-ray for lower back.  Meloxicam and Flexeril for pain.  Bring in  urine   Relevant Medications   cyclobenzaprine (FLEXERIL) 10 MG tablet   meloxicam (MOBIC) 15 MG tablet   Other Relevant Orders   UA/M w/rflx Culture, Routine   DG Lumbar Spine Complete      Unable to give a urine specimen.  Will bring it back  Follow up plan: Return in about 2 weeks (around 05/26/2016).

## 2016-05-20 ENCOUNTER — Telehealth: Payer: Self-pay

## 2016-05-20 NOTE — Telephone Encounter (Signed)
Received a fax from [employer] regarding patient's cologuard. They state that the order has been suspended for inactivity because it exceeded 60 days without activity. They state that they have made multiple attempts to contact patient but have been unsuccessful so they suggest that we reach out to the patient. Will send patient a letter reminding her to please complete cologuard and send it back for testing.

## 2016-05-26 ENCOUNTER — Other Ambulatory Visit: Payer: Self-pay | Admitting: Unknown Physician Specialty

## 2016-06-17 ENCOUNTER — Other Ambulatory Visit: Payer: Self-pay | Admitting: Unknown Physician Specialty

## 2016-06-17 NOTE — Telephone Encounter (Signed)
Last OV: 05/02/16 Next OV: 06/25/16  BMP Latest Ref Rng & Units 05/02/2016 05/01/2016 04/29/2016  Glucose 65 - 99 mg/dL 88 - 92  BUN 8 - 27 mg/dL 7(L) - 10  Creatinine 0.57 - 1.00 mg/dL 0.73 - 0.78  BUN/Creat Ratio 12 - 28 10(L) - 13  Sodium 134 - 144 mmol/L 145(H) - 141  Potassium 3.5 - 5.2 mmol/L 4.0 - 3.9  Chloride 96 - 106 mmol/L 103 - 100  CO2 18 - 29 mmol/L 27 - 26  Calcium 8.7 - 10.3 mg/dL 8.1(L) 7.4(L) 7.5(L)

## 2016-06-24 ENCOUNTER — Other Ambulatory Visit: Payer: Self-pay | Admitting: Unknown Physician Specialty

## 2016-06-25 ENCOUNTER — Encounter: Payer: Self-pay | Admitting: Unknown Physician Specialty

## 2016-06-25 ENCOUNTER — Ambulatory Visit (INDEPENDENT_AMBULATORY_CARE_PROVIDER_SITE_OTHER): Payer: BLUE CROSS/BLUE SHIELD | Admitting: Unknown Physician Specialty

## 2016-06-25 DIAGNOSIS — M545 Low back pain, unspecified: Secondary | ICD-10-CM

## 2016-06-25 DIAGNOSIS — G8929 Other chronic pain: Secondary | ICD-10-CM | POA: Diagnosis not present

## 2016-06-25 DIAGNOSIS — M797 Fibromyalgia: Secondary | ICD-10-CM | POA: Diagnosis not present

## 2016-06-25 DIAGNOSIS — M549 Dorsalgia, unspecified: Secondary | ICD-10-CM

## 2016-06-25 MED ORDER — FUROSEMIDE 40 MG PO TABS: 40.0000 mg | ORAL_TABLET | Freq: Every day | ORAL | 1 refills | Status: DC

## 2016-06-25 MED ORDER — ATORVASTATIN CALCIUM 40 MG PO TABS: 40.0000 mg | ORAL_TABLET | Freq: Every day | ORAL | 1 refills | Status: DC

## 2016-06-25 NOTE — Assessment & Plan Note (Signed)
Stable.  OK to self titrate Gabapentin

## 2016-06-25 NOTE — Assessment & Plan Note (Signed)
Improving.  Continue with exercises.

## 2016-06-25 NOTE — Progress Notes (Signed)
BP 113/73   Pulse 72   Temp 98.3 F (36.8 C)   Ht 5' 5.6" (1.666 m)   Wt 184 lb 11.2 oz (83.8 kg)   LMP  (LMP Unknown)   SpO2 93%   BMI 30.18 kg/m    Subjective:    Patient ID: Brandi Steele, female    DOB: 1955-04-03, 61 y.o.   MRN: [id number]  HPI: Brandi Steele is a 61 y.o. female  No chief complaint on file.  Back pain Pt is here to f/u of her back pain.  Pt states her back pain is improved with the occasional use of Meloxicam.  She states she is also doing trunk exercises in the evening while watching TV.   CHF We decreased her Furosemide last month due to electrolyte imbalance I suspect due to heavy diuresis.  She is doing well.  Last echo is good.    Fibromyalgia Doing well and would like to decrease Gabapentin  Relevant past medical, surgical, family and social history reviewed and updated as indicated. Interim medical history since our last visit reviewed. Allergies and medications reviewed and updated.  Review of Systems  Per HPI unless specifically indicated above     Objective:    BP 113/73   Pulse 72   Temp 98.3 F (36.8 C)   Ht 5' 5.6" (1.666 m)   Wt 184 lb 11.2 oz (83.8 kg)   LMP  (LMP Unknown)   SpO2 93%   BMI 30.18 kg/m   Wt Readings from Last 3 Encounters:  06/25/16 184 lb 11.2 oz (83.8 kg)  05/12/16 184 lb 12.8 oz (83.8 kg)  05/02/16 187 lb 9.6 oz (85.1 kg)    Physical Exam  Constitutional: She is oriented to person, place, and time. She appears well-developed and well-nourished. No distress.  HENT:  Head: Normocephalic and atraumatic.  Eyes: Conjunctivae and lids are normal. Right eye exhibits no discharge. Left eye exhibits no discharge. No scleral icterus.  Neck: Normal range of motion. Neck supple. No JVD present. Carotid bruit is not present.  Cardiovascular: Normal rate, regular rhythm and normal heart sounds.   Pulmonary/Chest: Effort normal and breath sounds normal.  Abdominal: Normal appearance. There is no splenomegaly  or hepatomegaly.  Musculoskeletal: Normal range of motion.  Neurological: She is alert and oriented to person, place, and time.  Skin: Skin is warm, dry and intact. No rash noted. No pallor.  Psychiatric: She has a normal mood and affect. Her behavior is normal. Judgment and thought content normal.    Results for orders placed or performed in visit on 05/02/16  Comprehensive metabolic panel  Result Value Ref Range   Glucose 88 65 - 99 mg/dL   BUN 7 (L) 8 - 27 mg/dL   Creatinine, Ser 0.73 0.57 - 1.00 mg/dL   GFR calc non Af Amer 89 >59 mL/min/1.73   GFR calc Af Amer 102 >59 mL/min/1.73   BUN/Creatinine Ratio 10 (L) 12 - 28   Sodium 145 (H) 134 - 144 mmol/L   Potassium 4.0 3.5 - 5.2 mmol/L   Chloride 103 96 - 106 mmol/L   CO2 27 18 - 29 mmol/L   Calcium 8.1 (L) 8.7 - 10.3 mg/dL   Total Protein 6.3 6.0 - 8.5 g/dL   Albumin 4.1 3.6 - 4.8 g/dL   Globulin, Total 2.2 1.5 - 4.5 g/dL   Albumin/Globulin Ratio 1.9 1.2 - 2.2   Bilirubin Total 0.2 0.0 - 1.2 mg/dL   Alkaline Phosphatase 60  39 - 117 IU/L   AST 16 0 - 40 IU/L   ALT 17 0 - 32 IU/L  Phosphorus  Result Value Ref Range   Phosphorus 2.6 2.5 - 4.5 mg/dL      Assessment & Plan:   Problem List Items Addressed This Visit      Unprioritized   Back pain    Improving.  Continue with exercises.        Fibromyalgia    Stable.  OK to self titrate Gabapentin      Hypocalcemia    Better last visit          Follow up plan: Return in about 6 months (around 12/26/2016).

## 2016-06-25 NOTE — Assessment & Plan Note (Signed)
Better last visit

## 2016-08-06 ENCOUNTER — Encounter: Payer: Self-pay | Admitting: Unknown Physician Specialty

## 2016-08-06 ENCOUNTER — Ambulatory Visit (INDEPENDENT_AMBULATORY_CARE_PROVIDER_SITE_OTHER): Payer: BLUE CROSS/BLUE SHIELD | Admitting: Unknown Physician Specialty

## 2016-08-06 VITALS — BP 130/74 | HR 68 | Temp 99.1°F | Wt 184.6 lb

## 2016-08-06 DIAGNOSIS — F439 Reaction to severe stress, unspecified: Secondary | ICD-10-CM

## 2016-08-06 DIAGNOSIS — R6884 Jaw pain: Secondary | ICD-10-CM

## 2016-08-06 DIAGNOSIS — N390 Urinary tract infection, site not specified: Secondary | ICD-10-CM | POA: Diagnosis not present

## 2016-08-06 MED ORDER — CIPROFLOXACIN HCL 250 MG PO TABS: 250.0000 mg | ORAL_TABLET | Freq: Two times a day (BID) | ORAL | 0 refills | Status: DC

## 2016-08-06 MED ORDER — LORAZEPAM 0.5 MG PO TABS: 0.5000 mg | ORAL_TABLET | Freq: Every day | ORAL | 0 refills | Status: DC | PRN

## 2016-08-06 MED ORDER — AMOXICILLIN-POT CLAVULANATE 875-125 MG PO TABS: 1.0000 | ORAL_TABLET | Freq: Two times a day (BID) | ORAL | 0 refills | Status: DC

## 2016-08-06 NOTE — Progress Notes (Signed)
BP 130/74   Pulse 68   Temp 99.1 F (37.3 C)   Wt 184 lb 9.6 oz (83.7 kg)   LMP  (LMP Unknown)   SpO2 92%   BMI 30.16 kg/m    Subjective:    Patient ID: Brandi Steele, female    DOB: 02/13/1955, 61 y.o.   MRN: [id number]  HPI: Brandi Steele is a 61 y.o. female  Chief Complaint  Patient presents with  . Jaw Pain    pt states she noticed a tender place on the right side of her jaw  . Nicotine Dependence    pt states she would like to discuss something to cope with smoking  . Medication Refill    pt states she is going on a cruise soon, and states she always gets a UTI. She would like something for this.    Jaw pain Tender on palpation right side of jaw.  No fever.  Only noticed when she put on face cream.    Recurrent UTI Wants Cipro in case she gets a UTI while on vacation.    Anxiety Pt states that her therapist suggested she have an occasional medication to help her with anxiety.  Pt states she would take it 1-2 times/month.  She has not taken Ambien for a year.    Smoking Smoking 1 pack per day.    Relevant past medical, surgical, family and social history reviewed and updated as indicated. Interim medical history since our last visit reviewed. Allergies and medications reviewed and updated.  Review of Systems  Per HPI unless specifically indicated above     Objective:    BP 130/74   Pulse 68   Temp 99.1 F (37.3 C)   Wt 184 lb 9.6 oz (83.7 kg)   LMP  (LMP Unknown)   SpO2 92%   BMI 30.16 kg/m   Wt Readings from Last 3 Encounters:  08/06/16 184 lb 9.6 oz (83.7 kg)  06/25/16 184 lb 11.2 oz (83.8 kg)  05/12/16 184 lb 12.8 oz (83.8 kg)    Physical Exam  Constitutional: She is oriented to person, place, and time. She appears well-developed and well-nourished. No distress.  HENT:  Head: Normocephalic and atraumatic.  Eyes: Conjunctivae and lids are normal. Right eye exhibits no discharge. Left eye exhibits no discharge. No scleral icterus.  Neck:  Normal range of motion. Neck supple. No JVD present. Carotid bruit is not present.  Cardiovascular: Normal rate, regular rhythm and normal heart sounds.   Pulmonary/Chest: Effort normal and breath sounds normal.  Abdominal: Normal appearance. There is no splenomegaly or hepatomegaly.  Musculoskeletal: Normal range of motion.  Neurological: She is alert and oriented to person, place, and time.  Skin: Skin is warm, dry and intact. No rash noted. No pallor.  Psychiatric: She has a normal mood and affect. Her behavior is normal. Judgment and thought content normal.    Results for orders placed or performed in visit on 05/02/16  Comprehensive metabolic panel  Result Value Ref Range   Glucose 88 65 - 99 mg/dL   BUN 7 (L) 8 - 27 mg/dL   Creatinine, Ser 0.73 0.57 - 1.00 mg/dL   GFR calc non Af Amer 89 >59 mL/min/1.73   GFR calc Af Amer 102 >59 mL/min/1.73   BUN/Creatinine Ratio 10 (L) 12 - 28   Sodium 145 (H) 134 - 144 mmol/L   Potassium 4.0 3.5 - 5.2 mmol/L   Chloride 103 96 - 106 mmol/L  CO2 27 18 - 29 mmol/L   Calcium 8.1 (L) 8.7 - 10.3 mg/dL   Total Protein 6.3 6.0 - 8.5 g/dL   Albumin 4.1 3.6 - 4.8 g/dL   Globulin, Total 2.2 1.5 - 4.5 g/dL   Albumin/Globulin Ratio 1.9 1.2 - 2.2   Bilirubin Total 0.2 0.0 - 1.2 mg/dL   Alkaline Phosphatase 60 39 - 117 IU/L   AST 16 0 - 40 IU/L   ALT 17 0 - 32 IU/L  Phosphorus  Result Value Ref Range   Phosphorus 2.6 2.5 - 4.5 mg/dL      Assessment & Plan:   Problem List Items Addressed This Visit      Unprioritized   Stress    Will rx Lorazepam to take on occasion.  #30 should last 6 months       Other Visit Diagnoses    Jaw pain    -  Primary   I am unable to palpate any abnormalities.  Since she is going on a cruise, will rx some Augmentin 875 mg BID for 10 days if worsening.  ENT if no improvement   Recurrent UTI       Rx fof Cipro if needed       Follow up plan: Return if symptoms worsen or fail to improve.

## 2016-08-06 NOTE — Assessment & Plan Note (Signed)
Will rx Lorazepam to take on occasion.  #30 should last 6 months

## 2016-08-06 NOTE — Patient Instructions (Signed)
[facility].   2-3 year residential program.

## 2016-09-03 ENCOUNTER — Other Ambulatory Visit: Payer: Self-pay | Admitting: Unknown Physician Specialty

## 2016-09-16 ENCOUNTER — Other Ambulatory Visit: Payer: Self-pay | Admitting: Family Medicine

## 2016-09-16 NOTE — Telephone Encounter (Signed)
Your patient 

## 2016-10-08 ENCOUNTER — Encounter: Payer: Self-pay | Admitting: Family Medicine

## 2016-10-08 ENCOUNTER — Ambulatory Visit (INDEPENDENT_AMBULATORY_CARE_PROVIDER_SITE_OTHER): Payer: BLUE CROSS/BLUE SHIELD | Admitting: Family Medicine

## 2016-10-08 VITALS — BP 143/77 | HR 103 | Temp 98.7°F | Wt 183.6 lb

## 2016-10-08 DIAGNOSIS — J449 Chronic obstructive pulmonary disease, unspecified: Secondary | ICD-10-CM

## 2016-10-08 DIAGNOSIS — R252 Cramp and spasm: Secondary | ICD-10-CM | POA: Diagnosis not present

## 2016-10-08 DIAGNOSIS — F419 Anxiety disorder, unspecified: Secondary | ICD-10-CM | POA: Diagnosis not present

## 2016-10-08 MED ORDER — DOXYCYCLINE HYCLATE 100 MG PO TABS: 100.0000 mg | ORAL_TABLET | Freq: Two times a day (BID) | ORAL | 0 refills | Status: DC

## 2016-10-08 MED ORDER — ALBUTEROL SULFATE HFA 108 (90 BASE) MCG/ACT IN AERS: 2.0000 | INHALATION_SPRAY | Freq: Four times a day (QID) | RESPIRATORY_TRACT | 6 refills | Status: DC | PRN

## 2016-10-08 MED ORDER — BUDESONIDE-FORMOTEROL FUMARATE 160-4.5 MCG/ACT IN AERO: 2.0000 | INHALATION_SPRAY | Freq: Two times a day (BID) | RESPIRATORY_TRACT | 6 refills | Status: DC

## 2016-10-08 MED ORDER — ALPRAZOLAM 0.5 MG PO TABS: 0.5000 mg | ORAL_TABLET | Freq: Two times a day (BID) | ORAL | 0 refills | Status: DC | PRN

## 2016-10-08 NOTE — Progress Notes (Signed)
BP (!) 143/77   Pulse (!) 103   Temp 98.7 F (37.1 C)   Wt 183 lb 9.6 oz (83.3 kg)   LMP  (LMP Unknown)   SpO2 94%   BMI 30.00 kg/m    Subjective:    Patient ID: Brandi Steele, female    DOB: 08-07-1955, 61 y.o.   MRN: [id number]  HPI: Brandi Steele is a 61 y.o. female  Chief Complaint  Patient presents with  . URI    pt states she has had a cough and congestion for a week  . Anxiety    pt states her anxiety has been very bad for the last month. States she has had a couple of panic attacks and has not been able to sleep     Patient presents today for over a week of productive cough, congestion, wheezing, and SOB. Denies known fever, body aches, CP. Using her albuterol religiously with some relief. Does have a hx of COPD, trying to quit smoking currently.   Also having severe flare of anxiety with frequent panic episodes and crying spells x 1 month. Sees a therapist regularly, which does help some. Was put on lorazepam prn but never felt any relief with that. Took her daughter's xanax one time during a panic episode and had great relief. Has been on zoloft 50 mg for a while now with some benefit. States this past week was the anniversary of her daughter's death and that may have been one of the triggers.   Also notes severe extremity cramps that are chronic but have been much worse lately. Long hx of hypocalcemia, hypokalemia, and hypomagnesemia all on supplementation. Has not changed her diet, supplements, or other medications recently. Lasix was decreased several months ago with no benefit. Previous labs reviewed and endorse deficiencies.   Past Medical History:  Diagnosis Date  . Anxiety   . Aortic stenosis   . CHF (congestive heart failure) ([place])   . COPD (chronic obstructive pulmonary disease) ([place])   . Depression   . Fibromyalgia   . GERD (gastroesophageal reflux disease)   . Hyperlipidemia   . Hypertension   . Hypertensive heart disease   . Myalgia   . Obesity     . Sciatica   . Seborrheic keratosis   . Stress incontinence    Social History   Social History  . Marital status: Married    Spouse name: N/A  . Number of children: N/A  . Years of education: N/A   Occupational History  . Not on file.   Social History Main Topics  . Smoking status: Current Every Day Smoker    Packs/day: 0.50    Years: 40.00    Types: Cigarettes  . Smokeless tobacco: Never Used     Comment: Is trying cold-turkey with social support  . Alcohol use No  . Drug use: No  . Sexual activity: No   Other Topics Concern  . Not on file   Social History Narrative  . No narrative on file    Relevant past medical, surgical, family and social history reviewed and updated as indicated. Interim medical history since our last visit reviewed. Allergies and medications reviewed and updated.  Review of Systems  Constitutional: Negative.   HENT: Positive for congestion.   Respiratory: Positive for cough, chest tightness, shortness of breath and wheezing.   Cardiovascular: Negative.   Gastrointestinal: Negative.   Musculoskeletal: Negative.   Neurological: Negative.   Psychiatric/Behavioral: Positive for dysphoric mood and  sleep disturbance. The patient is nervous/anxious.    Per HPI unless specifically indicated above     Objective:    BP (!) 143/77   Pulse (!) 103   Temp 98.7 F (37.1 C)   Wt 183 lb 9.6 oz (83.3 kg)   LMP  (LMP Unknown)   SpO2 94%   BMI 30.00 kg/m   Wt Readings from Last 3 Encounters:  10/08/16 183 lb 9.6 oz (83.3 kg)  08/06/16 184 lb 9.6 oz (83.7 kg)  06/25/16 184 lb 11.2 oz (83.8 kg)    Physical Exam  Constitutional: She is oriented to person, place, and time. She appears well-developed and well-nourished. No distress.  HENT:  Head: Atraumatic.  Right Ear: External ear normal.  Left Ear: External ear normal.  Nares with thick drainage present Oropharynx injected  Eyes: Pupils are equal, round, and reactive to light. Conjunctivae  are normal.  Neck: Normal range of motion. Neck supple.  Cardiovascular: Normal rate and normal heart sounds.   Pulmonary/Chest: Effort normal and breath sounds normal. No respiratory distress.  Musculoskeletal: Normal range of motion.  Neurological: She is alert and oriented to person, place, and time.  Skin: Skin is warm and dry.  Psychiatric: Judgment normal.  Appears very anxious, tearful when discussing her panic episodes  Nursing note and vitals reviewed.  Results for orders placed or performed in visit on 10/08/16  Comprehensive metabolic panel  Result Value Ref Range   Glucose 102 (H) 65 - 99 mg/dL   BUN 7 (L) 8 - 27 mg/dL   Creatinine, Ser 0.78 0.57 - 1.00 mg/dL   GFR calc non Af Amer 81 >59 mL/min/1.73   GFR calc Af Amer 94 >59 mL/min/1.73   BUN/Creatinine Ratio 9 (L) 12 - 28   Sodium 145 (H) 134 - 144 mmol/L   Potassium 3.3 (L) 3.5 - 5.2 mmol/L   Chloride 102 96 - 106 mmol/L   CO2 26 20 - 29 mmol/L   Calcium 7.8 (L) 8.7 - 10.3 mg/dL   Total Protein 6.6 6.0 - 8.5 g/dL   Albumin 4.5 3.6 - 4.8 g/dL   Globulin, Total 2.1 1.5 - 4.5 g/dL   Albumin/Globulin Ratio 2.1 1.2 - 2.2   Bilirubin Total 0.3 0.0 - 1.2 mg/dL   Alkaline Phosphatase 76 39 - 117 IU/L   AST 21 0 - 40 IU/L   ALT 12 0 - 32 IU/L  Magnesium  Result Value Ref Range   Magnesium 0.9 (LL) 1.6 - 2.3 mg/dL  TSH  Result Value Ref Range   TSH 1.060 0.450 - 4.500 uIU/mL      Assessment & Plan:   Problem List Items Addressed This Visit      Respiratory   COPD (chronic obstructive pulmonary disease) ([place]) - Primary    Will treat current flare with doxycycline, albuterol, and symbicort inhaler. Discussed supportive care, mucinex, rest. F/u if worsening or no improvement      Relevant Medications   budesonide-formoterol (SYMBICORT) 160-4.5 MCG/ACT inhaler   albuterol (PROVENTIL HFA;VENTOLIN HFA) 108 (90 Base) MCG/ACT inhaler     Other   Anxiety    Will switch to xanax as she had better relief with this.  Continue regular counseling sessions. Will f/u in 1 month to make sure things are improved. At that time, can re-evaluate changing/increasing zoloft in hopes of better overall long-term control       Relevant Medications   ALPRAZolam (XANAX) 0.5 MG tablet   Other Relevant Orders  TSH (Completed)    Other Visit Diagnoses    Muscle cramping       Will recheck her levels today, continue the supplementation and good hydration. Reviewed lists of foods high in magnesium, calcium, and potassium   Relevant Orders   Comprehensive metabolic panel (Completed)   Magnesium (Completed)    Precautions reviewed at length with xanax regarding sedation, avoiding driving, sleeping pills, alcohol, or use of narcotic pain medication while on medication.   Follow up plan: Return in about 4 weeks (around 11/05/2016) for Anxiety f/u.

## 2016-10-09 ENCOUNTER — Encounter: Payer: Self-pay | Admitting: Family Medicine

## 2016-10-09 ENCOUNTER — Telehealth: Payer: Self-pay | Admitting: Family Medicine

## 2016-10-09 DIAGNOSIS — E876 Hypokalemia: Secondary | ICD-10-CM

## 2016-10-09 LAB — COMPREHENSIVE METABOLIC PANEL
A/G RATIO: 2.1 (ref 1.2–2.2)
ALBUMIN: 4.5 g/dL (ref 3.6–4.8)
ALK PHOS: 76 IU/L (ref 39–117)
ALT: 12 IU/L (ref 0–32)
AST: 21 IU/L (ref 0–40)
BILIRUBIN TOTAL: 0.3 mg/dL (ref 0.0–1.2)
BUN / CREAT RATIO: 9 — AB (ref 12–28)
BUN: 7 mg/dL — ABNORMAL LOW (ref 8–27)
CO2: 26 mmol/L (ref 20–29)
Calcium: 7.8 mg/dL — ABNORMAL LOW (ref 8.7–10.3)
Chloride: 102 mmol/L (ref 96–106)
Creatinine, Ser: 0.78 mg/dL (ref 0.57–1.00)
GFR calc Af Amer: 94 mL/min/{1.73_m2} (ref >59)
GFR calc non Af Amer: 81 mL/min/{1.73_m2} (ref >59)
GLOBULIN, TOTAL: 2.1 g/dL (ref 1.5–4.5)
Glucose: 102 mg/dL — ABNORMAL HIGH (ref 65–99)
Potassium: 3.3 mmol/L — ABNORMAL LOW (ref 3.5–5.2)
Sodium: 145 mmol/L — ABNORMAL HIGH (ref 134–144)
Total Protein: 6.6 g/dL (ref 6.0–8.5)

## 2016-10-09 LAB — MAGNESIUM: Magnesium: 0.9 mg/dL — CL (ref 1.6–2.3)

## 2016-10-09 LAB — TSH: TSH: 1.06 u[IU]/mL (ref 0.450–4.500)

## 2016-10-09 NOTE — Telephone Encounter (Signed)
Called patient to discuss significant magnesium deficiency, hypocalcemia, hypokalemia. Will increase her supplementation of each, discussed dietary supplementation, good hydration, avoiding alcohol. Will check labs again Monday or Tuesday and go from there. Return precautions for over weekend discussed at length.

## 2016-10-09 NOTE — Assessment & Plan Note (Signed)
Will switch to xanax as she had better relief with this. Continue regular counseling sessions. Will f/u in 1 month to make sure things are improved. At that time, can re-evaluate changing/increasing zoloft in hopes of better overall long-term control

## 2016-10-09 NOTE — Assessment & Plan Note (Signed)
Will treat current flare with doxycycline, albuterol, and symbicort inhaler. Discussed supportive care, mucinex, rest. F/u if worsening or no improvement

## 2016-10-09 NOTE — Patient Instructions (Signed)
Follow up in 1 month   

## 2016-10-13 ENCOUNTER — Other Ambulatory Visit: Payer: BLUE CROSS/BLUE SHIELD

## 2016-10-13 DIAGNOSIS — E876 Hypokalemia: Secondary | ICD-10-CM

## 2016-10-14 LAB — COMPREHENSIVE METABOLIC PANEL
A/G RATIO: 2 (ref 1.2–2.2)
ALK PHOS: 70 IU/L (ref 39–117)
ALT: 11 IU/L (ref 0–32)
AST: 19 IU/L (ref 0–40)
Albumin: 4.3 g/dL (ref 3.6–4.8)
BILIRUBIN TOTAL: 0.2 mg/dL (ref 0.0–1.2)
BUN/Creatinine Ratio: 9 — ABNORMAL LOW (ref 12–28)
BUN: 6 mg/dL — ABNORMAL LOW (ref 8–27)
CO2: 25 mmol/L (ref 20–29)
Calcium: 7.6 mg/dL — ABNORMAL LOW (ref 8.7–10.3)
Chloride: 105 mmol/L (ref 96–106)
Creatinine, Ser: 0.68 mg/dL (ref 0.57–1.00)
GFR calc Af Amer: 108 mL/min/{1.73_m2} (ref >59)
GFR calc non Af Amer: 93 mL/min/{1.73_m2} (ref >59)
GLOBULIN, TOTAL: 2.2 g/dL (ref 1.5–4.5)
Glucose: 92 mg/dL (ref 65–99)
Potassium: 4.5 mmol/L (ref 3.5–5.2)
SODIUM: 144 mmol/L (ref 134–144)
Total Protein: 6.5 g/dL (ref 6.0–8.5)

## 2016-10-14 LAB — MAGNESIUM: MAGNESIUM: 1.4 mg/dL — AB (ref 1.6–2.3)

## 2016-10-15 ENCOUNTER — Telehealth: Payer: Self-pay | Admitting: Family Medicine

## 2016-10-15 MED ORDER — HYDROCOD POLST-CPM POLST ER 10-8 MG/5ML PO SUER: 5.0000 mL | Freq: Two times a day (BID) | ORAL | 0 refills | Status: DC | PRN

## 2016-10-15 NOTE — Telephone Encounter (Signed)
Patient is out of medication for cough syrup ad would like to know if she can get a refill of the prescription sent to [facility] in [place], [place].

## 2016-10-15 NOTE — Telephone Encounter (Signed)
Small supply of tussionex written and ready to fax

## 2016-10-15 NOTE — Telephone Encounter (Signed)
Patient notified and rx faxed.

## 2016-10-15 NOTE — Telephone Encounter (Signed)
Spoke with patient about lab results. Levels have improved, still mildly low mag and calcium but back to her baseline. Continue with the increased supplementation, will recheck at upcoming f/u. Return precautions reviewed and pt agreeable

## 2016-10-15 NOTE — Telephone Encounter (Signed)
Routing to provider  

## 2016-10-20 ENCOUNTER — Telehealth: Payer: Self-pay | Admitting: Unknown Physician Specialty

## 2016-10-20 NOTE — Telephone Encounter (Signed)
Called and spoke to patient. She states that her cardiologist told her to just stop her furosemide. Patient states that she would like to talk to [facility].

## 2016-10-20 NOTE — Telephone Encounter (Signed)
Discussed with pt about fuosemide.  Encouraged to follow cardiology advice

## 2016-10-24 ENCOUNTER — Ambulatory Visit: Payer: BLUE CROSS/BLUE SHIELD | Admitting: Unknown Physician Specialty

## 2016-10-27 ENCOUNTER — Telehealth: Payer: Self-pay | Admitting: Unknown Physician Specialty

## 2016-10-27 NOTE — Telephone Encounter (Signed)
Patient states that she stopped taking the Furosemide that was causing her calcium and magnesium deficiency one week ago.  Patient would like to know if she can come in to have her labs drawn.  Please call patient to advise.

## 2016-10-27 NOTE — Telephone Encounter (Signed)
Please schedule patient an office visit with Malachy Mood. Thanks.

## 2016-10-27 NOTE — Telephone Encounter (Signed)
Routing to provider. Can patient come in for labs? If so, please enter future orders for labs you would like done.

## 2016-10-27 NOTE — Telephone Encounter (Signed)
She did not show up for her visit last week.  Needs a visit

## 2016-10-29 ENCOUNTER — Ambulatory Visit (INDEPENDENT_AMBULATORY_CARE_PROVIDER_SITE_OTHER): Payer: BLUE CROSS/BLUE SHIELD | Admitting: Unknown Physician Specialty

## 2016-10-29 ENCOUNTER — Encounter: Payer: Self-pay | Admitting: Unknown Physician Specialty

## 2016-10-29 VITALS — BP 136/80 | HR 82 | Temp 98.3°F | Wt 179.4 lb

## 2016-10-29 DIAGNOSIS — E876 Hypokalemia: Secondary | ICD-10-CM | POA: Diagnosis not present

## 2016-10-29 DIAGNOSIS — R0902 Hypoxemia: Secondary | ICD-10-CM | POA: Diagnosis not present

## 2016-10-29 DIAGNOSIS — J209 Acute bronchitis, unspecified: Secondary | ICD-10-CM | POA: Diagnosis not present

## 2016-10-29 DIAGNOSIS — J0100 Acute maxillary sinusitis, unspecified: Secondary | ICD-10-CM | POA: Diagnosis not present

## 2016-10-29 DIAGNOSIS — J42 Unspecified chronic bronchitis: Secondary | ICD-10-CM

## 2016-10-29 MED ORDER — HYDROCOD POLST-CPM POLST ER 10-8 MG/5ML PO SUER: 5.0000 mL | Freq: Two times a day (BID) | ORAL | 0 refills | Status: DC | PRN

## 2016-10-29 MED ORDER — AMOXICILLIN-POT CLAVULANATE 875-125 MG PO TABS: 1.0000 | ORAL_TABLET | Freq: Two times a day (BID) | ORAL | 0 refills | Status: AC

## 2016-10-29 NOTE — Progress Notes (Signed)
BP 136/80   Pulse 82   Temp 98.3 F (36.8 C)   Wt 179 lb 6.4 oz (81.4 kg)   LMP  (LMP Unknown)   SpO2 (!) 84%   BMI 29.31 kg/m    Subjective:    Patient ID: Brandi Steele, female    DOB: 11-25-1955, 61 y.o.   MRN: [id number]  HPI: Brandi Steele is a 61 y.o. female  Chief Complaint  Patient presents with  . Labs Only  . Cough    pt states she still has a cough and would like another RX for cough syrup if possible    Pt states she was taken off her Furosemide by the cardiologist due to a normal echo.  Furosemide has been a problem due to electrolyte (K, Mg, Ca) imbalances. Rather than switching to a non-loop diuretic she was taken off.  Since off, she has been stable without weight gain and feels better.    Cough  This is a new problem. Episode onset: Cough, got Doxycycline, got rid of it and now coming back. The problem has been gradually worsening. The problem occurs constantly. The cough is productive of sputum. Associated symptoms include nasal congestion, postnasal drip, a sore throat and shortness of breath. The symptoms are aggravated by lying down. She has tried a beta-agonist inhaler and prescription cough suppressant for the symptoms. The treatment provided moderate relief. Her past medical history is significant for COPD. Using Symbicort daily     Relevant past medical, surgical, family and social history reviewed and updated as indicated. Interim medical history since our last visit reviewed. Allergies and medications reviewed and updated.  Review of Systems  HENT: Positive for postnasal drip and sore throat.   Respiratory: Positive for cough and shortness of breath.     Per HPI unless specifically indicated above     Objective:    BP 136/80   Pulse 82   Temp 98.3 F (36.8 C)   Wt 179 lb 6.4 oz (81.4 kg)   LMP  (LMP Unknown)   SpO2 (!) 84%   BMI 29.31 kg/m   Wt Readings from Last 3 Encounters:  10/29/16 179 lb 6.4 oz (81.4 kg)  10/08/16 183 lb 9.6  oz (83.3 kg)  08/06/16 184 lb 9.6 oz (83.7 kg)    Physical Exam  Constitutional: She is oriented to person, place, and time. She appears well-developed and well-nourished. No distress.  HENT:  Head: Normocephalic and atraumatic.  Eyes: Conjunctivae and lids are normal. Right eye exhibits no discharge. Left eye exhibits no discharge. No scleral icterus.  Neck: Normal range of motion. Neck supple. No JVD present. Carotid bruit is not present.  Cardiovascular: Normal rate, regular rhythm and normal heart sounds.   Pulmonary/Chest: Effort normal. She has decreased breath sounds.  Abdominal: Normal appearance. There is no splenomegaly or hepatomegaly.  Musculoskeletal: Normal range of motion.  Neurological: She is alert and oriented to person, place, and time.  Skin: Skin is warm, dry and intact. No rash noted. No pallor.  Psychiatric: She has a normal mood and affect. Her behavior is normal. Judgment and thought content normal.    Results for orders placed or performed in visit on 10/13/16  Comprehensive metabolic panel  Result Value Ref Range   Glucose 92 65 - 99 mg/dL   BUN 6 (L) 8 - 27 mg/dL   Creatinine, Ser 0.68 0.57 - 1.00 mg/dL   GFR calc non Af Amer 93 >59 mL/min/1.73   GFR  calc Af Amer 108 >59 mL/min/1.73   BUN/Creatinine Ratio 9 (L) 12 - 28   Sodium 144 134 - 144 mmol/L   Potassium 4.5 3.5 - 5.2 mmol/L   Chloride 105 96 - 106 mmol/L   CO2 25 20 - 29 mmol/L   Calcium 7.6 (L) 8.7 - 10.3 mg/dL   Total Protein 6.5 6.0 - 8.5 g/dL   Albumin 4.3 3.6 - 4.8 g/dL   Globulin, Total 2.2 1.5 - 4.5 g/dL   Albumin/Globulin Ratio 2.0 1.2 - 2.2   Bilirubin Total 0.2 0.0 - 1.2 mg/dL   Alkaline Phosphatase 70 39 - 117 IU/L   AST 19 0 - 40 IU/L   ALT 11 0 - 32 IU/L  Magnesium  Result Value Ref Range   Magnesium 1.4 (L) 1.6 - 2.3 mg/dL      Assessment & Plan:   Problem List Items Addressed This Visit      Unprioritized   Hypocalcemia   Relevant Orders   Comprehensive metabolic  panel   Hypomagnesemia   Relevant Orders   Magnesium   Hypoxia    First pulse ox was 85% and second one was 96%      Relevant Orders   DG Chest 2 View    Other Visit Diagnoses    Acute non-recurrent maxillary sinusitis    -  Primary   Relevant Medications   amoxicillin-clavulanate (AUGMENTIN) 875-125 MG tablet   chlorpheniramine-HYDROcodone (TUSSIONEX PENNKINETIC ER) 10-8 MG/5ML SUER   Acute exacerbation of chronic bronchitis ([facility])       Start Augmentin.  Will get chest x-ay   Relevant Medications   chlorpheniramine-HYDROcodone (TUSSIONEX PENNKINETIC ER) 10-8 MG/5ML SUER   Other Relevant Orders   DG Chest 2 View   Hypokalemia       Relevant Orders   Comprehensive metabolic panel       Follow up plan: No Follow-up on file.

## 2016-10-29 NOTE — Assessment & Plan Note (Signed)
First pulse ox was 85% and second one was 96%

## 2016-10-30 LAB — COMPREHENSIVE METABOLIC PANEL
ALBUMIN: 4.4 g/dL (ref 3.6–4.8)
ALT: 18 IU/L (ref 0–32)
AST: 22 IU/L (ref 0–40)
Albumin/Globulin Ratio: 1.9 (ref 1.2–2.2)
Alkaline Phosphatase: 64 IU/L (ref 39–117)
BUN / CREAT RATIO: 13 (ref 12–28)
BUN: 10 mg/dL (ref 8–27)
Bilirubin Total: 0.2 mg/dL (ref 0.0–1.2)
CALCIUM: 9.6 mg/dL (ref 8.7–10.3)
CO2: 22 mmol/L (ref 20–29)
CREATININE: 0.75 mg/dL (ref 0.57–1.00)
Chloride: 102 mmol/L (ref 96–106)
GFR, EST AFRICAN AMERICAN: 98 mL/min/{1.73_m2} (ref >59)
GFR, EST NON AFRICAN AMERICAN: 85 mL/min/{1.73_m2} (ref >59)
GLOBULIN, TOTAL: 2.3 g/dL (ref 1.5–4.5)
Glucose: 60 mg/dL — ABNORMAL LOW (ref 65–99)
Potassium: 4.1 mmol/L (ref 3.5–5.2)
SODIUM: 143 mmol/L (ref 134–144)
TOTAL PROTEIN: 6.7 g/dL (ref 6.0–8.5)

## 2016-10-30 LAB — MAGNESIUM: Magnesium: 2 mg/dL (ref 1.6–2.3)

## 2016-11-09 ENCOUNTER — Other Ambulatory Visit: Payer: Self-pay | Admitting: Family Medicine

## 2016-11-09 ENCOUNTER — Other Ambulatory Visit: Payer: Self-pay | Admitting: Unknown Physician Specialty

## 2016-11-10 ENCOUNTER — Ambulatory Visit: Payer: BLUE CROSS/BLUE SHIELD | Admitting: Family Medicine

## 2016-11-10 NOTE — Telephone Encounter (Signed)
Your patient 

## 2016-11-28 ENCOUNTER — Ambulatory Visit (INDEPENDENT_AMBULATORY_CARE_PROVIDER_SITE_OTHER): Payer: BLUE CROSS/BLUE SHIELD | Admitting: Unknown Physician Specialty

## 2016-11-28 ENCOUNTER — Encounter: Payer: Self-pay | Admitting: Unknown Physician Specialty

## 2016-11-28 VITALS — BP 152/70 | HR 72 | Temp 97.6°F | Wt 190.0 lb

## 2016-11-28 DIAGNOSIS — F410 Panic disorder [episodic paroxysmal anxiety] without agoraphobia: Secondary | ICD-10-CM | POA: Diagnosis not present

## 2016-11-28 DIAGNOSIS — G4762 Sleep related leg cramps: Secondary | ICD-10-CM

## 2016-11-28 DIAGNOSIS — J449 Chronic obstructive pulmonary disease, unspecified: Secondary | ICD-10-CM | POA: Diagnosis not present

## 2016-11-28 DIAGNOSIS — R252 Cramp and spasm: Secondary | ICD-10-CM | POA: Insufficient documentation

## 2016-11-28 DIAGNOSIS — R0602 Shortness of breath: Secondary | ICD-10-CM | POA: Diagnosis not present

## 2016-11-28 MED ORDER — SERTRALINE HCL 100 MG PO TABS: 100.0000 mg | ORAL_TABLET | Freq: Every day | ORAL | 1 refills | Status: DC

## 2016-11-28 NOTE — Patient Instructions (Signed)
Charcot's

## 2016-11-28 NOTE — Assessment & Plan Note (Signed)
Continue with inhaler use

## 2016-11-28 NOTE — Assessment & Plan Note (Addendum)
Recommended returning back to counseling.  Increase her Sertraline to 100 mg.  She is concerned about mental illness.

## 2016-11-28 NOTE — Progress Notes (Signed)
BP (!) 152/70   Pulse 72   Temp 97.6 F (36.4 C)   Wt 190 lb (86.2 kg)   LMP  (LMP Unknown)   SpO2 91%   BMI 31.04 kg/m    Subjective:    Patient ID: Brandi Steele, female    DOB: March 09, 1955, 61 y.o.   MRN: [id number]  HPI: Brandi Steele is a 61 y.o. female  Chief Complaint  Patient presents with  . Anxiety   Pt was on a cruise and say the cruise ship medical staff for SOB, tachypnea, and anxiety.  Labs were normal.  Pule ox would desat to 89%  Placed on oxygen.  Ruled out for MI.  She admits to be overwhelmed.  She is taking zoloft.  Takes a Xanax about twice a week.  Hasn't been taking Ambien.  Waking up with cramps.  Calcium noted to be low while on the cruise.   Cramps Night time cramps in legs.  Also cramps in hands in which they are deformed.  Often having to pull apart  Relevant past medical, surgical, family and social history reviewed and updated as indicated. Interim medical history since our last visit reviewed. Allergies and medications reviewed and updated.  Review of Systems  Per HPI unless specifically indicated above     Objective:    BP (!) 152/70   Pulse 72   Temp 97.6 F (36.4 C)   Wt 190 lb (86.2 kg)   LMP  (LMP Unknown)   SpO2 91%   BMI 31.04 kg/m   Wt Readings from Last 3 Encounters:  11/28/16 190 lb (86.2 kg)  10/29/16 179 lb 6.4 oz (81.4 kg)  10/08/16 183 lb 9.6 oz (83.3 kg)    Physical Exam  Constitutional: She is oriented to person, place, and time. She appears well-developed and well-nourished. No distress.  HENT:  Head: Normocephalic and atraumatic.  Eyes: Conjunctivae and lids are normal. Right eye exhibits no discharge. Left eye exhibits no discharge. No scleral icterus.  Neck: Normal range of motion. Neck supple. No JVD present. Carotid bruit is not present.  Cardiovascular: Normal rate, regular rhythm and normal heart sounds.   Pulmonary/Chest: Effort normal. She has decreased breath sounds in the right lower field and the  left lower field.  Abdominal: Normal appearance. There is no splenomegaly or hepatomegaly.  Musculoskeletal: Normal range of motion.  Neurological: She is alert and oriented to person, place, and time.  Skin: Skin is warm, dry and intact. No rash noted. No pallor.  Psychiatric: She has a normal mood and affect. Her behavior is normal. Judgment and thought content normal.    Results for orders placed or performed in visit on 10/29/16  Comprehensive metabolic panel  Result Value Ref Range   Glucose 60 (L) 65 - 99 mg/dL   BUN 10 8 - 27 mg/dL   Creatinine, Ser 0.75 0.57 - 1.00 mg/dL   GFR calc non Af Amer 85 >59 mL/min/1.73   GFR calc Af Amer 98 >59 mL/min/1.73   BUN/Creatinine Ratio 13 12 - 28   Sodium 143 134 - 144 mmol/L   Potassium 4.1 3.5 - 5.2 mmol/L   Chloride 102 96 - 106 mmol/L   CO2 22 20 - 29 mmol/L   Calcium 9.6 8.7 - 10.3 mg/dL   Total Protein 6.7 6.0 - 8.5 g/dL   Albumin 4.4 3.6 - 4.8 g/dL   Globulin, Total 2.3 1.5 - 4.5 g/dL   Albumin/Globulin Ratio 1.9 1.2 - 2.2  Bilirubin Total 0.2 0.0 - 1.2 mg/dL   Alkaline Phosphatase 64 39 - 117 IU/L   AST 22 0 - 40 IU/L   ALT 18 0 - 32 IU/L  Magnesium  Result Value Ref Range   Magnesium 2.0 1.6 - 2.3 mg/dL      Assessment & Plan:   Problem List Items Addressed This Visit      Unprioritized   COPD (chronic obstructive pulmonary disease) ([place])    Continue with inhaler use      Hand cramps    Plus leg cramps.  Check electrolytes.  May need nerve conduction study.        Hypocalcemia    Recheck levels today      Relevant Orders   Comprehensive metabolic panel   PTH, Intact and Calcium   Panic disorder    Recommended returning back to counseling.  Increase her Sertraline to 100 mg.  She is concerned about mental illness.        Relevant Medications   sertraline (ZOLOFT) 100 MG tablet    Other Visit Diagnoses    SOB (shortness of breath)    -  Primary   suspect due to panic disorder.     Relevant Orders    Magnesium   Nocturnal leg cramps       Relevant Orders   Magnesium       Follow up plan: Return in about 4 weeks (around 12/26/2016).

## 2016-11-28 NOTE — Assessment & Plan Note (Signed)
Plus leg cramps.  Check electrolytes.  May need nerve conduction study.

## 2016-11-28 NOTE — Assessment & Plan Note (Signed)
Recheck levels today

## 2016-11-29 LAB — MAGNESIUM: MAGNESIUM: 0.6 mg/dL — AB (ref 1.6–2.3)

## 2016-11-29 LAB — COMPREHENSIVE METABOLIC PANEL
A/G RATIO: 2.1 (ref 1.2–2.2)
ALT: 14 IU/L (ref 0–32)
AST: 18 IU/L (ref 0–40)
Albumin: 4.4 g/dL (ref 3.6–4.8)
Alkaline Phosphatase: 70 IU/L (ref 39–117)
BUN/Creatinine Ratio: 8 — ABNORMAL LOW (ref 12–28)
BUN: 6 mg/dL — AB (ref 8–27)
Bilirubin Total: 0.4 mg/dL (ref 0.0–1.2)
CALCIUM: 7.1 mg/dL — AB (ref 8.7–10.3)
CO2: 25 mmol/L (ref 20–29)
CREATININE: 0.8 mg/dL (ref 0.57–1.00)
Chloride: 105 mmol/L (ref 96–106)
GFR, EST AFRICAN AMERICAN: 91 mL/min/{1.73_m2} (ref >59)
GFR, EST NON AFRICAN AMERICAN: 79 mL/min/{1.73_m2} (ref >59)
Globulin, Total: 2.1 g/dL (ref 1.5–4.5)
Glucose: 78 mg/dL (ref 65–99)
POTASSIUM: 4.2 mmol/L (ref 3.5–5.2)
Sodium: 148 mmol/L — ABNORMAL HIGH (ref 134–144)
TOTAL PROTEIN: 6.5 g/dL (ref 6.0–8.5)

## 2016-11-29 LAB — PTH, INTACT AND CALCIUM: PTH: 39 pg/mL (ref 15–65)

## 2016-12-01 ENCOUNTER — Telehealth: Payer: Self-pay | Admitting: Unknown Physician Specialty

## 2016-12-01 ENCOUNTER — Other Ambulatory Visit: Payer: BLUE CROSS/BLUE SHIELD

## 2016-12-01 NOTE — Telephone Encounter (Signed)
Discussed low mg and Ca.  PTH is normal.  Check Vit D.  Consider referral to Nephrology.  Restart Mg supplement.

## 2016-12-02 LAB — VITAMIN D 25 HYDROXY (VIT D DEFICIENCY, FRACTURES): VIT D 25 HYDROXY: 33.7 ng/mL (ref 30.0–100.0)

## 2016-12-09 ENCOUNTER — Ambulatory Visit: Payer: BLUE CROSS/BLUE SHIELD | Admitting: Family Medicine

## 2016-12-10 ENCOUNTER — Ambulatory Visit: Payer: BLUE CROSS/BLUE SHIELD | Admitting: Family Medicine

## 2016-12-10 ENCOUNTER — Encounter: Payer: Self-pay | Admitting: Family Medicine

## 2016-12-10 DIAGNOSIS — R21 Rash and other nonspecific skin eruption: Secondary | ICD-10-CM | POA: Diagnosis not present

## 2016-12-10 DIAGNOSIS — F419 Anxiety disorder, unspecified: Secondary | ICD-10-CM | POA: Diagnosis not present

## 2016-12-10 MED ORDER — TRIAMCINOLONE ACETONIDE 0.1 % EX CREA: 1.0000 "application " | TOPICAL_CREAM | Freq: Two times a day (BID) | CUTANEOUS | 1 refills | Status: DC

## 2016-12-10 MED ORDER — BUSPIRONE HCL 15 MG PO TABS: 15.0000 mg | ORAL_TABLET | Freq: Two times a day (BID) | ORAL | 0 refills | Status: DC

## 2016-12-10 MED ORDER — HYDROXYZINE HCL 25 MG PO TABS: 25.0000 mg | ORAL_TABLET | Freq: Three times a day (TID) | ORAL | 0 refills | Status: DC | PRN

## 2016-12-10 NOTE — Progress Notes (Signed)
BP (!) 168/82 (BP Location: Right Arm, Patient Position: Sitting, Cuff Size: Normal)   Pulse 90   Temp 98.2 F (36.8 C) (Oral)   Wt 184 lb (83.5 kg)   LMP  (LMP Unknown)   SpO2 95%   BMI 30.06 kg/m    Subjective:    Patient ID: Brandi Steele, female    DOB: 1955-12-20, 61 y.o.   MRN: [id number]  HPI: CASHE GATT is a 60 y.o. female  Chief Complaint  Patient presents with  . Body Cramps    Patient thinks it's from medication  . Blood Levels    Taking magnesium and potassium because of low levels in blood.   . Anxiety  . Pruritis  . Diarrhea    x's 1 month   Patient presents today with worsening body cramps. Long hx of low magnesium and calcium, on supplemental doses of both. Recent check shows levels that are fairly consistent with her previous. States all the mag she takes causes severe diarrhea, going several times daily.   Zoloft has never helped her, she does not want to be on it any longer. The xanax seems to help tremendously for her anxiety/panic episodes. She only takes as needed, never more than once daily. #30 has lasted her 2 months.  Denies side effects with the medication.   Also having severe itching and redness all over body.No new products, foods, medications. Has not been trying anything other than lotion, that has not helped.   Past Medical History:  Diagnosis Date  . Anxiety   . Aortic stenosis   . CHF (congestive heart failure) ([place])   . COPD (chronic obstructive pulmonary disease) ([place])   . Depression   . Fibromyalgia   . GERD (gastroesophageal reflux disease)   . Hyperlipidemia   . Hypertension   . Hypertensive heart disease   . Myalgia   . Obesity   . Sciatica   . Seborrheic keratosis   . Stress incontinence    Social History   Socioeconomic History  . Marital status: Married    Spouse name: Not on file  . Number of children: Not on file  . Years of education: Not on file  . Highest education level: Not on file  Social Needs    . Financial resource strain: Not on file  . Food insecurity - worry: Not on file  . Food insecurity - inability: Not on file  . Transportation needs - medical: Not on file  . Transportation needs - non-medical: Not on file  Occupational History  . Not on file  Tobacco Use  . Smoking status: Former Smoker    Packs/day: 0.50    Years: 40.00    Pack years: 20.00    Types: Cigarettes    Last attempt to quit: 11/01/2016    Years since quitting: 0.1  . Smokeless tobacco: Never Used  . Tobacco comment: Is trying cold-turkey with social support  Substance and Sexual Activity  . Alcohol use: No    Alcohol/week: 0.0 oz  . Drug use: No  . Sexual activity: No  Other Topics Concern  . Not on file  Social History Narrative  . Not on file     Relevant past medical, surgical, family and social history reviewed and updated as indicated. Interim medical history since our last visit reviewed. Allergies and medications reviewed and updated.  Review of Systems  Constitutional: Negative.   HENT: Negative.   Eyes: Negative.   Respiratory: Negative.  Cardiovascular: Negative.   Gastrointestinal: Positive for diarrhea.  Genitourinary: Negative.   Musculoskeletal: Positive for myalgias.  Skin: Positive for rash.  Neurological: Negative.   Psychiatric/Behavioral: The patient is nervous/anxious.    Per HPI unless specifically indicated above     Objective:    BP (!) 168/82 (BP Location: Right Arm, Patient Position: Sitting, Cuff Size: Normal)   Pulse 90   Temp 98.2 F (36.8 C) (Oral)   Wt 184 lb (83.5 kg)   LMP  (LMP Unknown)   SpO2 95%   BMI 30.06 kg/m   Wt Readings from Last 3 Encounters:  12/10/16 184 lb (83.5 kg)  11/28/16 190 lb (86.2 kg)  10/29/16 179 lb 6.4 oz (81.4 kg)    Physical Exam  Constitutional: She is oriented to person, place, and time. She appears well-developed and well-nourished. No distress.  HENT:  Head: Atraumatic.  Eyes: Conjunctivae are normal. Pupils  are equal, round, and reactive to light. No scleral icterus.  Neck: Normal range of motion. Neck supple.  Cardiovascular: Normal rate and normal heart sounds.  Pulmonary/Chest: Effort normal and breath sounds normal. No respiratory distress.  Musculoskeletal: Normal range of motion.  Neurological: She is alert and oriented to person, place, and time. No cranial nerve deficit.  Skin: Skin is warm and dry. Rash (diffusely erythematous areas without papules across arms and left hip that appear to be irritated from itching) noted.  Psychiatric: Judgment and thought content normal.  tearful  Nursing note and vitals reviewed.  Results for orders placed or performed in visit on 12/01/16  VITAMIN D 25 Hydroxy (Vit-D Deficiency, Fractures)  Result Value Ref Range   Vit D, 25-Hydroxy 33.7 30.0 - 100.0 ng/mL      Assessment & Plan:   Problem List Items Addressed This Visit      Other   Hypomagnesemia - Primary    Continue supplementation, eating well balanced diet rich in magnesium. Discussed importance of slowing down the diarrhea, imodium prn. Push fluids. Will recheck things in 2 weeks unless major changes in meantime      Hypocalcemia    Continue supplementation, imodium prn for diarrhea.       Anxiety    Xanax seems to be the only thing providing any sort of relief from her anxiety and panic episodes. Continue on a prn basis. Will start buspar and monitor closely for relief in place of zoloft as she has never felt any benefit from that.       Relevant Medications   hydrOXYzine (ATARAX/VISTARIL) 25 MG tablet   busPIRone (BUSPAR) 15 MG tablet    Other Visit Diagnoses    Rash       Unclear etiology, possibly stress related. Hydroxyzine and triamcinolone cream sent, moisturize BID with a good cream. F/u if no improvement       Follow up plan: Return in about 2 weeks (around 12/24/2016) for Recheck mag, rash.

## 2016-12-12 NOTE — Patient Instructions (Signed)
Follow-up in 2 weeks

## 2016-12-12 NOTE — Assessment & Plan Note (Signed)
Continue supplementation, eating well balanced diet rich in magnesium. Discussed importance of slowing down the diarrhea, imodium prn. Push fluids. Will recheck things in 2 weeks unless major changes in meantime

## 2016-12-12 NOTE — Assessment & Plan Note (Signed)
Continue supplementation, imodium prn for diarrhea.

## 2016-12-12 NOTE — Assessment & Plan Note (Signed)
Xanax seems to be the only thing providing any sort of relief from her anxiety and panic episodes. Continue on a prn basis. Will start buspar and monitor closely for relief in place of zoloft as she has never felt any benefit from that.

## 2016-12-24 ENCOUNTER — Other Ambulatory Visit: Payer: Self-pay | Admitting: Unknown Physician Specialty

## 2016-12-26 ENCOUNTER — Ambulatory Visit: Payer: BLUE CROSS/BLUE SHIELD | Admitting: Unknown Physician Specialty

## 2016-12-26 ENCOUNTER — Encounter: Payer: Self-pay | Admitting: Family Medicine

## 2016-12-26 ENCOUNTER — Ambulatory Visit: Payer: BLUE CROSS/BLUE SHIELD | Admitting: Family Medicine

## 2016-12-26 VITALS — BP 145/74 | HR 69 | Temp 97.8°F | Wt 189.5 lb

## 2016-12-26 DIAGNOSIS — J449 Chronic obstructive pulmonary disease, unspecified: Secondary | ICD-10-CM

## 2016-12-26 DIAGNOSIS — F419 Anxiety disorder, unspecified: Secondary | ICD-10-CM

## 2016-12-26 DIAGNOSIS — G4700 Insomnia, unspecified: Secondary | ICD-10-CM | POA: Diagnosis not present

## 2016-12-26 DIAGNOSIS — Z23 Encounter for immunization: Secondary | ICD-10-CM

## 2016-12-26 DIAGNOSIS — M797 Fibromyalgia: Secondary | ICD-10-CM

## 2016-12-26 MED ORDER — MELOXICAM 15 MG PO TABS: ORAL_TABLET | ORAL | 3 refills | Status: DC

## 2016-12-26 MED ORDER — CLOPIDOGREL BISULFATE 75 MG PO TABS: 75.0000 mg | ORAL_TABLET | Freq: Every day | ORAL | 3 refills | Status: DC

## 2016-12-26 MED ORDER — ALPRAZOLAM 0.5 MG PO TABS: 0.5000 mg | ORAL_TABLET | Freq: Two times a day (BID) | ORAL | 0 refills | Status: DC | PRN

## 2016-12-26 MED ORDER — ALBUTEROL SULFATE HFA 108 (90 BASE) MCG/ACT IN AERS: 2.0000 | INHALATION_SPRAY | Freq: Four times a day (QID) | RESPIRATORY_TRACT | 6 refills | Status: DC | PRN

## 2016-12-26 MED ORDER — HYDROXYZINE HCL 25 MG PO TABS: 25.0000 mg | ORAL_TABLET | Freq: Three times a day (TID) | ORAL | 3 refills | Status: DC | PRN

## 2016-12-26 MED ORDER — GABAPENTIN 600 MG PO TABS: ORAL_TABLET | ORAL | 3 refills | Status: DC

## 2016-12-26 MED ORDER — CYCLOBENZAPRINE HCL 10 MG PO TABS: 10.0000 mg | ORAL_TABLET | Freq: Three times a day (TID) | ORAL | 4 refills | Status: DC | PRN

## 2016-12-26 MED ORDER — BUSPIRONE HCL 15 MG PO TABS: 15.0000 mg | ORAL_TABLET | Freq: Two times a day (BID) | ORAL | 3 refills | Status: DC

## 2016-12-26 MED ORDER — ESOMEPRAZOLE MAGNESIUM 40 MG PO CPDR: DELAYED_RELEASE_CAPSULE | ORAL | 2 refills | Status: DC

## 2016-12-26 MED ORDER — BUDESONIDE-FORMOTEROL FUMARATE 160-4.5 MCG/ACT IN AERO: 2.0000 | INHALATION_SPRAY | Freq: Two times a day (BID) | RESPIRATORY_TRACT | 6 refills | Status: DC

## 2016-12-26 MED ORDER — ATORVASTATIN CALCIUM 40 MG PO TABS: 40.0000 mg | ORAL_TABLET | Freq: Every day | ORAL | 3 refills | Status: DC

## 2016-12-26 NOTE — Assessment & Plan Note (Signed)
Doing well on buspar and prn xanax, continue current regimen

## 2016-12-26 NOTE — Assessment & Plan Note (Signed)
Continue half tab of hydroxyzine nightly as needed for sleep. Working very well

## 2016-12-26 NOTE — Patient Instructions (Signed)

## 2016-12-26 NOTE — Assessment & Plan Note (Signed)
Successfully tapering down from 600 mg TID to QHS. Hoping to come off altogether soon.

## 2016-12-26 NOTE — Assessment & Plan Note (Signed)
Unclear etiology, will refer to endocrinology for further workup.Continue supplementation, recheck levels today

## 2016-12-26 NOTE — Progress Notes (Signed)
BP (!) 145/74 (BP Location: Right Arm, Patient Position: Sitting, Cuff Size: Normal)   Pulse 69   Temp 97.8 F (36.6 C)   Wt 189 lb 8 oz (86 kg)   LMP  (LMP Unknown)   SpO2 94%   BMI 30.96 kg/m    Subjective:    Patient ID: Brandi Steele, female    DOB: March 05, 1955, 61 y.o.   MRN: [id number]  HPI: Brandi Steele is a 61 y.o. female  Chief Complaint  Patient presents with  . Anxiety  . Hypertension  . abnormal labs   Here for two week f/u on multiple issues. Still has a few xanax left from last month's script. Taking no more than two a day, trying to really limit how much she's taking. Doing much better with it. Buspar is helping quite a bit more than the zoloft, so she is starting to be able now to reduce how much xanax she's requiring.   Switched to [employer] glyconate soft capsules and feels like she's doing much better with that. No more diarrhea either with this form. Cramping has significantly improved. Still taking calcium and potassium supplements additionally and drinking lots of water.   Has reduced gabapentin to one tablet nightly and doing very well. Hoping to get off of it altogether in the next few months.   Rash has improved significantly as well with use of hydroxyzine and triamcinolone. Has also started using a 1/2 tab of hydroxyzine to help with sleep and getting good results with that.   Past Medical History:  Diagnosis Date  . Anxiety   . Aortic stenosis   . CHF (congestive heart failure) ([place])   . COPD (chronic obstructive pulmonary disease) ([place])   . Depression   . Fibromyalgia   . GERD (gastroesophageal reflux disease)   . Hyperlipidemia   . Hypertension   . Hypertensive heart disease   . Myalgia   . Obesity   . Sciatica   . Seborrheic keratosis   . Stress incontinence    Social History   Socioeconomic History  . Marital status: Married    Spouse name: Not on file  . Number of children: Not on file  . Years of education: Not on file  .  Highest education level: Not on file  Social Needs  . Financial resource strain: Not on file  . Food insecurity - worry: Not on file  . Food insecurity - inability: Not on file  . Transportation needs - medical: Not on file  . Transportation needs - non-medical: Not on file  Occupational History  . Not on file  Tobacco Use  . Smoking status: Former Smoker    Packs/day: 0.50    Years: 40.00    Pack years: 20.00    Types: Cigarettes    Last attempt to quit: 11/01/2016    Years since quitting: 0.1  . Smokeless tobacco: Never Used  . Tobacco comment: Is trying cold-turkey with social support  Substance and Sexual Activity  . Alcohol use: No    Alcohol/week: 0.0 oz  . Drug use: No  . Sexual activity: No  Other Topics Concern  . Not on file  Social History Narrative  . Not on file    Relevant past medical, surgical, family and social history reviewed and updated as indicated. Interim medical history since our last visit reviewed. Allergies and medications reviewed and updated.  Review of Systems  Constitutional: Negative.   HENT: Negative.   Eyes: Negative.  Respiratory: Negative.   Cardiovascular: Negative.   Gastrointestinal: Negative.   Genitourinary: Negative.   Musculoskeletal: Negative.   Neurological: Negative.   Psychiatric/Behavioral: Negative.    Per HPI unless specifically indicated above     Objective:    BP (!) 145/74 (BP Location: Right Arm, Patient Position: Sitting, Cuff Size: Normal)   Pulse 69   Temp 97.8 F (36.6 C)   Wt 189 lb 8 oz (86 kg)   LMP  (LMP Unknown)   SpO2 94%   BMI 30.96 kg/m   Wt Readings from Last 3 Encounters:  12/26/16 189 lb 8 oz (86 kg)  12/10/16 184 lb (83.5 kg)  11/28/16 190 lb (86.2 kg)    Physical Exam  Constitutional: She is oriented to person, place, and time. She appears well-developed and well-nourished. No distress.  HENT:  Head: Atraumatic.  Eyes: Conjunctivae are normal. Pupils are equal, round, and  reactive to light. No scleral icterus.  Neck: Normal range of motion. Neck supple.  Cardiovascular: Normal rate, regular rhythm and normal heart sounds.  Pulmonary/Chest: Effort normal and breath sounds normal. She exhibits no tenderness.  Musculoskeletal: Normal range of motion.  Lymphadenopathy:    She has no cervical adenopathy.  Neurological: She is alert and oriented to person, place, and time.  Skin: Skin is warm and dry.  Psychiatric: She has a normal mood and affect. Her behavior is normal.  Nursing note and vitals reviewed.   Results for orders placed or performed in visit on 12/01/16  VITAMIN D 25 Hydroxy (Vit-D Deficiency, Fractures)  Result Value Ref Range   Vit D, 25-Hydroxy 33.7 30.0 - 100.0 ng/mL      Assessment & Plan:   Problem List Items Addressed This Visit      Respiratory   COPD (chronic obstructive pulmonary disease) ([facility])   Relevant Medications   albuterol (PROVENTIL HFA;VENTOLIN HFA) 108 (90 Base) MCG/ACT inhaler   budesonide-formoterol (SYMBICORT) 160-4.5 MCG/ACT inhaler     Other   Fibromyalgia    Successfully tapering down from 600 mg TID to QHS. Hoping to come off altogether soon.       Insomnia    Continue half tab of hydroxyzine nightly as needed for sleep. Working very well       Hypomagnesemia    Symptomatically improved currently with switch to softgels, will recheck labs as well as check celiac labs - still unclear why she is chronically experiencing low mag as well as hypocalcemia and hypokalemia      Relevant Orders   Tissue Transglutaminase Abs,IgG,IgA   Magnesium   Hypocalcemia    Unclear etiology, will refer to endocrinology for further workup.Continue supplementation, recheck levels today      Relevant Orders   Basic Metabolic Panel (BMET)   Ambulatory referral to Endocrinology   Anxiety - Primary    Doing well on buspar and prn xanax, continue current regimen      Relevant Medications   busPIRone (BUSPAR) 15 MG tablet     ALPRAZolam (XANAX) 0.5 MG tablet   hydrOXYzine (ATARAX/VISTARIL) 25 MG tablet    Other Visit Diagnoses    Immunization due       Relevant Orders   Flu Vaccine QUAD 6+ mos PF IM (Fluarix Quad PF) (Completed)       Follow up plan: Return in about 4 months (around 04/25/2017) for CPE.

## 2016-12-26 NOTE — Assessment & Plan Note (Signed)
Symptomatically improved currently with switch to softgels, will recheck labs as well as check celiac labs - still unclear why she is chronically experiencing low mag as well as hypocalcemia and hypokalemia

## 2016-12-28 LAB — MAGNESIUM: Magnesium: 1.9 mg/dL (ref 1.6–2.3)

## 2016-12-28 LAB — BASIC METABOLIC PANEL WITH GFR
BUN/Creatinine Ratio: 18 (ref 12–28)
BUN: 14 mg/dL (ref 8–27)
CO2: 25 mmol/L (ref 20–29)
Calcium: 9.3 mg/dL (ref 8.7–10.3)
Chloride: 102 mmol/L (ref 96–106)
Creatinine, Ser: 0.78 mg/dL (ref 0.57–1.00)
GFR calc Af Amer: 94 mL/min/{1.73_m2}
GFR calc non Af Amer: 81 mL/min/{1.73_m2}
Glucose: 69 mg/dL (ref 65–99)
Potassium: 4.7 mmol/L (ref 3.5–5.2)
Sodium: 141 mmol/L (ref 134–144)

## 2016-12-28 LAB — TISSUE TRANSGLUTAMINASE ABS,IGG,IGA: Tissue Transglut Ab: <2 U/mL (ref 0–5)

## 2017-01-14 ENCOUNTER — Telehealth: Payer: Self-pay | Admitting: *Deleted

## 2017-01-14 NOTE — Telephone Encounter (Signed)
Left message for patient to notify them that it is time to schedule annual low dose lung cancer screening CT scan. Instructed patient to call back to verify information prior to the scan being scheduled.  

## 2017-01-22 ENCOUNTER — Other Ambulatory Visit: Payer: Self-pay | Admitting: Unknown Physician Specialty

## 2017-01-22 ENCOUNTER — Other Ambulatory Visit: Payer: Self-pay | Admitting: Family Medicine

## 2017-01-22 NOTE — Telephone Encounter (Signed)
Rx request 

## 2017-01-23 NOTE — Telephone Encounter (Signed)
Routing to provider  

## 2017-01-26 ENCOUNTER — Telehealth: Payer: Self-pay | Admitting: *Deleted

## 2017-01-26 DIAGNOSIS — Z122 Encounter for screening for malignant neoplasm of respiratory organs: Secondary | ICD-10-CM

## 2017-01-26 DIAGNOSIS — Z87891 Personal history of nicotine dependence: Secondary | ICD-10-CM

## 2017-01-26 NOTE — Telephone Encounter (Signed)
Notified patient that annual lung cancer screening low dose CT scan is due currently or will be in near future. Confirmed that patient is within the age range of 55-77, and asymptomatic, (no signs or symptoms of lung cancer). Patient denies illness that would prevent curative treatment for lung cancer if found. Verified smoking history, (former, quit 11/01/16, 40 pack year). The shared decision making visit was done 01/15/16. Patient is agreeable for CT scan being scheduled.

## 2017-02-06 ENCOUNTER — Inpatient Hospital Stay: Admission: RE | Admit: 2017-02-06 | Payer: BLUE CROSS/BLUE SHIELD | Source: Ambulatory Visit

## 2017-02-12 ENCOUNTER — Ambulatory Visit
Admission: RE | Admit: 2017-02-12 | Discharge: 2017-02-12 | Disposition: A | Discharge status: Home / Self Care | Payer: BLUE CROSS/BLUE SHIELD | Source: Ambulatory Visit | Attending: Nurse Practitioner | Admitting: Nurse Practitioner

## 2017-02-12 DIAGNOSIS — Z122 Encounter for screening for malignant neoplasm of respiratory organs: Secondary | ICD-10-CM | POA: Diagnosis not present

## 2017-02-12 DIAGNOSIS — I700 Atherosclerosis of aorta: Secondary | ICD-10-CM | POA: Insufficient documentation

## 2017-02-12 DIAGNOSIS — K319 Disease of stomach and duodenum, unspecified: Secondary | ICD-10-CM | POA: Insufficient documentation

## 2017-02-12 DIAGNOSIS — I2510 Atherosclerotic heart disease of native coronary artery without angina pectoris: Secondary | ICD-10-CM | POA: Diagnosis not present

## 2017-02-12 DIAGNOSIS — J439 Emphysema, unspecified: Secondary | ICD-10-CM | POA: Insufficient documentation

## 2017-02-12 DIAGNOSIS — Z87891 Personal history of nicotine dependence: Secondary | ICD-10-CM | POA: Insufficient documentation

## 2017-02-16 ENCOUNTER — Encounter: Payer: Self-pay | Admitting: Family Medicine

## 2017-02-16 ENCOUNTER — Encounter: Payer: Self-pay | Admitting: *Deleted

## 2017-02-16 DIAGNOSIS — I700 Atherosclerosis of aorta: Secondary | ICD-10-CM | POA: Insufficient documentation

## 2017-02-16 DIAGNOSIS — K2970 Gastritis, unspecified, without bleeding: Secondary | ICD-10-CM

## 2017-03-09 ENCOUNTER — Telehealth: Payer: Self-pay | Admitting: Unknown Physician Specialty

## 2017-03-09 NOTE — Telephone Encounter (Signed)
Patient has an appointment set up 04/14/2017 with Dr. Honor Junes at [facility].  Christan, could you notify patient of this?

## 2017-03-09 NOTE — Telephone Encounter (Signed)
Copied from [place]. Topic: Quick Communication - See Telephone Encounter >> Mar 09, 2017  2:25 PM Boyd Kerbs wrote: CRM for notification. See Telephone encounter for:    Pt. Called about the ENDOCRINOLOGY,  she never heard from anyone for an appt.  She is wanting to get this appt. Set up .  Pt. Just got a new phone but has same phone number.. She said her old phone was not working right.   Please call pt.   03/09/17.

## 2017-03-09 NOTE — Telephone Encounter (Signed)
Done

## 2017-03-23 ENCOUNTER — Ambulatory Visit: Payer: BLUE CROSS/BLUE SHIELD | Admitting: Family Medicine

## 2017-03-23 ENCOUNTER — Encounter: Payer: Self-pay | Admitting: Family Medicine

## 2017-03-23 ENCOUNTER — Encounter (INDEPENDENT_AMBULATORY_CARE_PROVIDER_SITE_OTHER): Payer: Self-pay

## 2017-03-23 VITALS — BP 133/68 | HR 97 | Temp 97.5°F | Wt 192.3 lb

## 2017-03-23 DIAGNOSIS — M797 Fibromyalgia: Secondary | ICD-10-CM | POA: Diagnosis not present

## 2017-03-23 DIAGNOSIS — E876 Hypokalemia: Secondary | ICD-10-CM | POA: Diagnosis not present

## 2017-03-23 DIAGNOSIS — F419 Anxiety disorder, unspecified: Secondary | ICD-10-CM | POA: Diagnosis not present

## 2017-03-23 MED ORDER — SUCRALFATE 1 G PO TABS: 1.0000 g | ORAL_TABLET | Freq: Two times a day (BID) | ORAL | 3 refills | Status: DC

## 2017-03-23 NOTE — Progress Notes (Signed)
BP 133/68 (BP Location: Right Arm, Patient Position: Sitting, Cuff Size: Normal)   Pulse 97   Temp (!) 97.5 F (36.4 C) (Tympanic)   Wt 192 lb 4.8 oz (87.2 kg)   LMP  (LMP Unknown)   SpO2 95%   BMI 31.04 kg/m    Subjective:    Patient ID: Brandi Steele, female    DOB: 07-18-1955, 62 y.o.   MRN: [id number]  HPI: Brandi Steele is a 62 y.o. female  Chief Complaint  Patient presents with  . Follow-up    Patient was taking lasix and her Cardiologist told her not to take it anymore because it was dropping vitamin c, magnesium and potassium. Patient stated after stopping lasix levels dropped. Patient is going on a Cruise and wants to her levels checked.   Pt here wanting her levels rechecked for multiple deficiencies (potassium, calcium and magnesium). Taking supplemental forms of each and has been doing very well since switching to magnesium soft gels rather than tablets. No further cramping/muscle pains. Feeling very well overall.  Buspar is helping so much that she hasn't been taking her xanax. Denies side effects with the medication. Having minimal panic attacks at this point.   Doing one gabapentin daily, has tapered herself down and doing very well. Wanting to eventually come off altogether. Has not had a fibromyalgia flare in months.   Past Medical History:  Diagnosis Date  . Anxiety   . Aortic stenosis   . CHF (congestive heart failure) ([place])   . COPD (chronic obstructive pulmonary disease) ([place])   . Depression   . Fibromyalgia   . GERD (gastroesophageal reflux disease)   . Hyperlipidemia   . Hypertension   . Hypertensive heart disease   . Myalgia   . Obesity   . Sciatica   . Seborrheic keratosis   . Stress incontinence    Social History   Socioeconomic History  . Marital status: Married    Spouse name: Not on file  . Number of children: Not on file  . Years of education: Not on file  . Highest education level: Not on file  Social Needs  . Financial  resource strain: Not on file  . Food insecurity - worry: Not on file  . Food insecurity - inability: Not on file  . Transportation needs - medical: Not on file  . Transportation needs - non-medical: Not on file  Occupational History  . Not on file  Tobacco Use  . Smoking status: Former Smoker    Packs/day: 0.50    Years: 40.00    Pack years: 20.00    Types: Cigarettes    Last attempt to quit: 11/01/2016    Years since quitting: 0.3  . Smokeless tobacco: Never Used  . Tobacco comment: Is trying cold-turkey with social support  Substance and Sexual Activity  . Alcohol use: No    Alcohol/week: 0.0 oz  . Drug use: No  . Sexual activity: No  Other Topics Concern  . Not on file  Social History Narrative  . Not on file   Relevant past medical, surgical, family and social history reviewed and updated as indicated. Interim medical history since our last visit reviewed. Allergies and medications reviewed and updated.  Review of Systems  Per HPI unless specifically indicated above     Objective:    BP 133/68 (BP Location: Right Arm, Patient Position: Sitting, Cuff Size: Normal)   Pulse 97   Temp (!) 97.5 F (36.4 C) (Tympanic)  Wt 192 lb 4.8 oz (87.2 kg)   LMP  (LMP Unknown)   SpO2 95%   BMI 31.04 kg/m   Wt Readings from Last 3 Encounters:  03/23/17 192 lb 4.8 oz (87.2 kg)  02/12/17 188 lb (85.3 kg)  12/26/16 189 lb 8 oz (86 kg)    Physical Exam  Constitutional: She is oriented to person, place, and time. She appears well-developed and well-nourished. No distress.  HENT:  Head: Atraumatic.  Eyes: Conjunctivae are normal. Pupils are equal, round, and reactive to light. No scleral icterus.  Neck: Normal range of motion. Neck supple.  Cardiovascular: Normal rate and normal heart sounds.  Pulmonary/Chest: Effort normal and breath sounds normal. No respiratory distress.  Musculoskeletal: Normal range of motion.  Neurological: She is alert and oriented to person, place, and  time.  Skin: Skin is warm and dry.  Psychiatric: She has a normal mood and affect. Her behavior is normal.  Nursing note and vitals reviewed.  Results for orders placed or performed in visit on 03/23/17  Magnesium  Result Value Ref Range   Magnesium 2.1 1.6 - 2.3 mg/dL  Basic Metabolic Panel (BMET)  Result Value Ref Range   Glucose 89 65 - 99 mg/dL   BUN 17 8 - 27 mg/dL   Creatinine, Ser 0.92 0.57 - 1.00 mg/dL   GFR calc non Af Amer 66 >59 mL/min/1.73   GFR calc Af Amer 77 >59 mL/min/1.73   BUN/Creatinine Ratio 18 12 - 28   Sodium 143 134 - 144 mmol/L   Potassium 4.5 3.5 - 5.2 mmol/L   Chloride 102 96 - 106 mmol/L   CO2 25 20 - 29 mmol/L   Calcium 9.5 8.7 - 10.3 mg/dL      Assessment & Plan:   Problem List Items Addressed This Visit      Other   Fibromyalgia    Stable on 1 gabapentin daily, hoping to taper down. Discussed trying half tab daily and reducing from there. Will titrate back up if becoming symptomatic again      Hypomagnesemia    Will recheck levels, asymptomatic at this time. Continue supplementation      Relevant Orders   Magnesium (Completed)   Hypocalcemia    Will recheck levels. Continue supplementation      Anxiety - Primary    Significant benefit from buspar, has been able to come off the xanax while on it. Continue current regimen       Other Visit Diagnoses    Hypokalemia       Relevant Orders   Basic Metabolic Panel (BMET) (Completed)       Follow up plan: Return in about 2 months (around 05/21/2017) for CPE.

## 2017-03-24 ENCOUNTER — Telehealth: Payer: Self-pay | Admitting: Family Medicine

## 2017-03-24 LAB — BASIC METABOLIC PANEL
BUN/Creatinine Ratio: 18 (ref 12–28)
BUN: 17 mg/dL (ref 8–27)
CO2: 25 mmol/L (ref 20–29)
Calcium: 9.5 mg/dL (ref 8.7–10.3)
Chloride: 102 mmol/L (ref 96–106)
Creatinine, Ser: 0.92 mg/dL (ref 0.57–1.00)
GFR, EST AFRICAN AMERICAN: 77 mL/min/{1.73_m2} (ref >59)
GFR, EST NON AFRICAN AMERICAN: 66 mL/min/{1.73_m2} (ref >59)
Glucose: 89 mg/dL (ref 65–99)
POTASSIUM: 4.5 mmol/L (ref 3.5–5.2)
Sodium: 143 mmol/L (ref 134–144)

## 2017-03-24 LAB — MAGNESIUM: Magnesium: 2.1 mg/dL (ref 1.6–2.3)

## 2017-03-24 NOTE — Assessment & Plan Note (Signed)
Stable on 1 gabapentin daily, hoping to taper down. Discussed trying half tab daily and reducing from there. Will titrate back up if becoming symptomatic again

## 2017-03-24 NOTE — Assessment & Plan Note (Signed)
Significant benefit from buspar, has been able to come off the xanax while on it. Continue current regimen

## 2017-03-24 NOTE — Telephone Encounter (Signed)
Called pt to discuss lab results. All WNL, pt notified to continue current supplementation regimen. Pt requesting letter for jury duty due to her anxiety, fibromyalgia, incontinence, and CHF next month as she struggles to sit for long periods of time due to these complications. OK to generate letter for pick up

## 2017-03-24 NOTE — Assessment & Plan Note (Signed)
Will recheck levels. Continue supplementation

## 2017-03-24 NOTE — Assessment & Plan Note (Signed)
Will recheck levels, asymptomatic at this time. Continue supplementation

## 2017-03-24 NOTE — Patient Instructions (Signed)
Follow up as scheduled.  

## 2017-03-24 NOTE — Telephone Encounter (Signed)
Contacted patient. She needs to contact the clerk of the court first and see what all is exactly needed. They may have a specific form. Patient said she would come by and drop everything off and leave to me.

## 2017-03-25 NOTE — Telephone Encounter (Signed)
Letter printed and signed, please call for pick up

## 2017-03-25 NOTE — Telephone Encounter (Signed)
Routing to provider to provider letter of medical conditions to get out of jury duty.

## 2017-03-25 NOTE — Telephone Encounter (Signed)
Patient calling back. No specific form, just need a letter for excuse. Please advise Call be back [phone]

## 2017-03-25 NOTE — Telephone Encounter (Signed)
Called patient no answer. Left message for patient letter is ready for pick up.  Note written to call patient again to be sure.

## 2017-04-17 IMAGING — DX DG LUMBAR SPINE COMPLETE 4+V
6 series · 6 of 6 positions shown · non-contrast
Comparison: None.

CLINICAL DATA: Low back pain 1 week. Buttock numbness after driving
today. No radicular symptoms. No injury.

EXAM:
LUMBAR SPINE - COMPLETE 4+ VIEW

[l-spine ap]
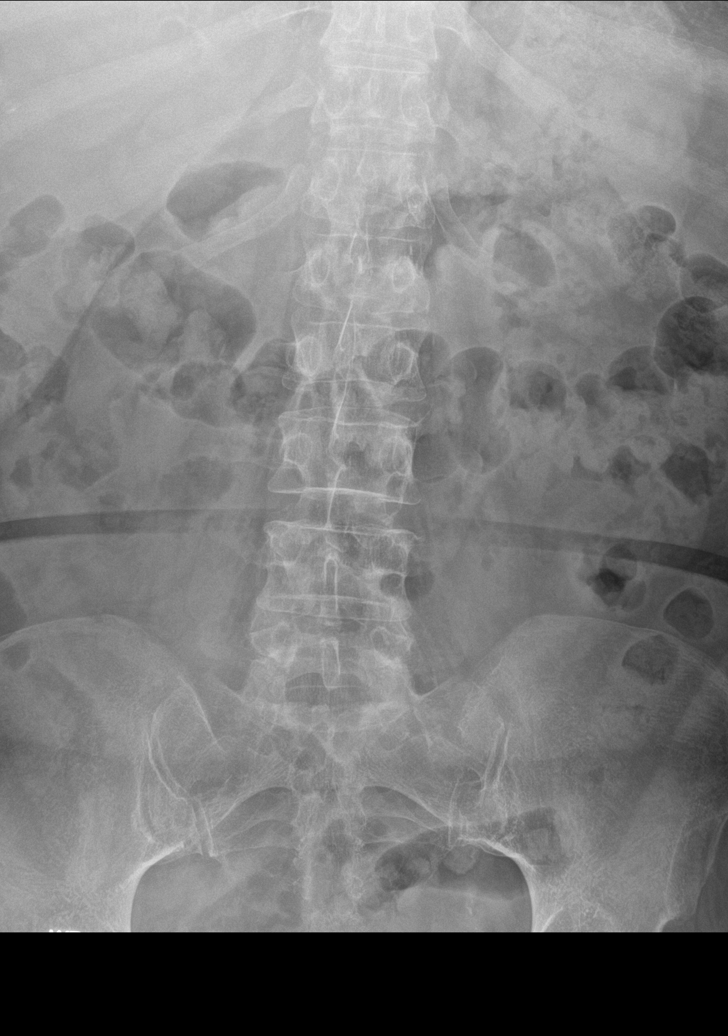

[l-spine obl (1 of 3)]
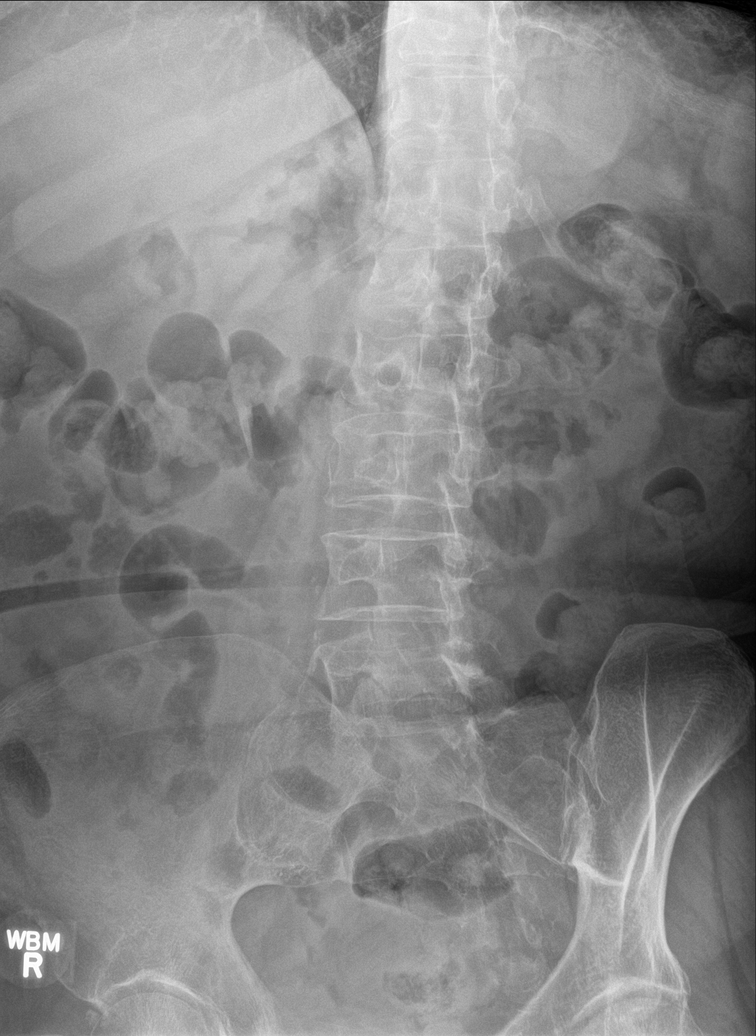

[l-spine obl (2 of 3)]
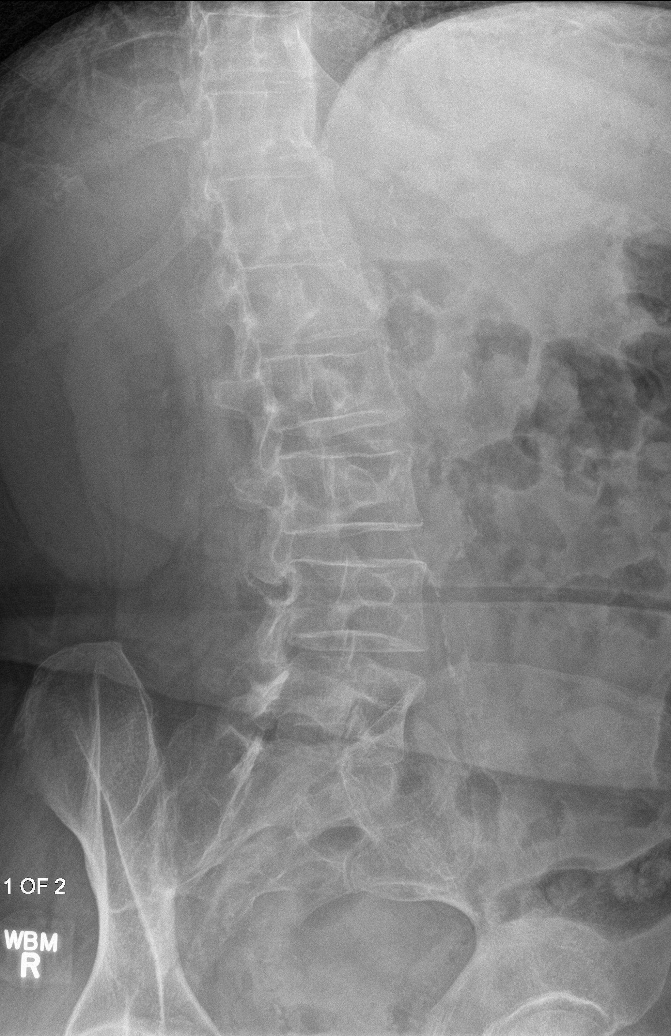

[l-spine lat]
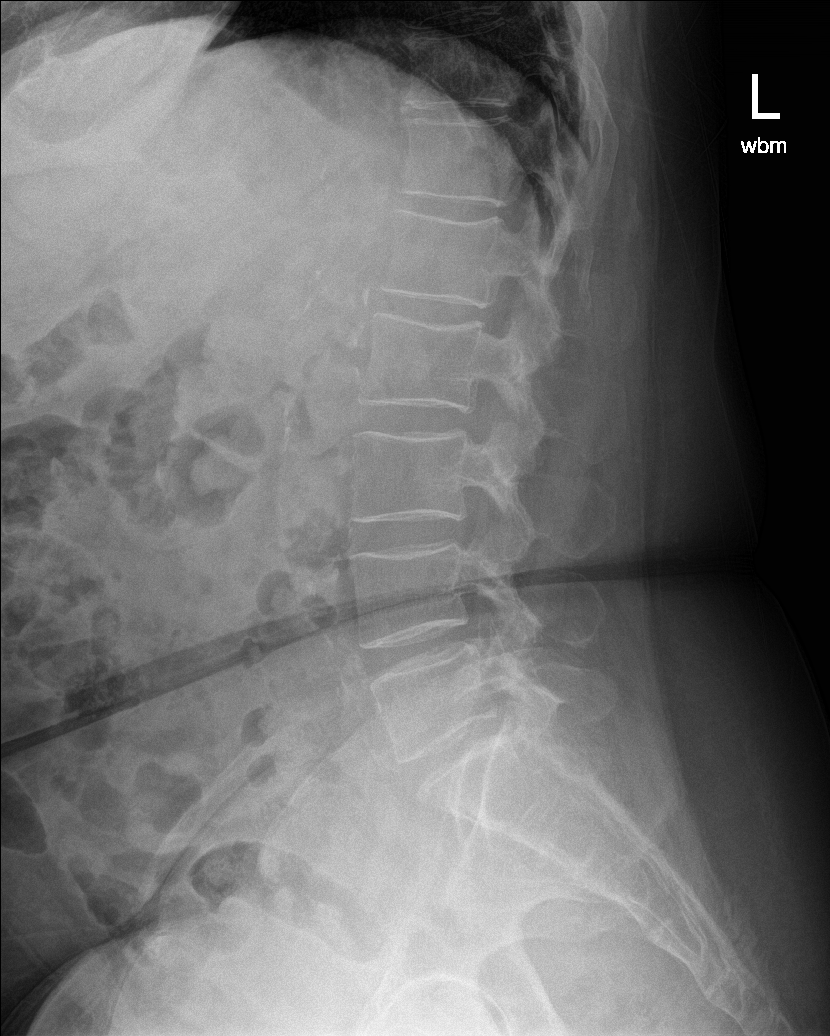

[l-spine spot]
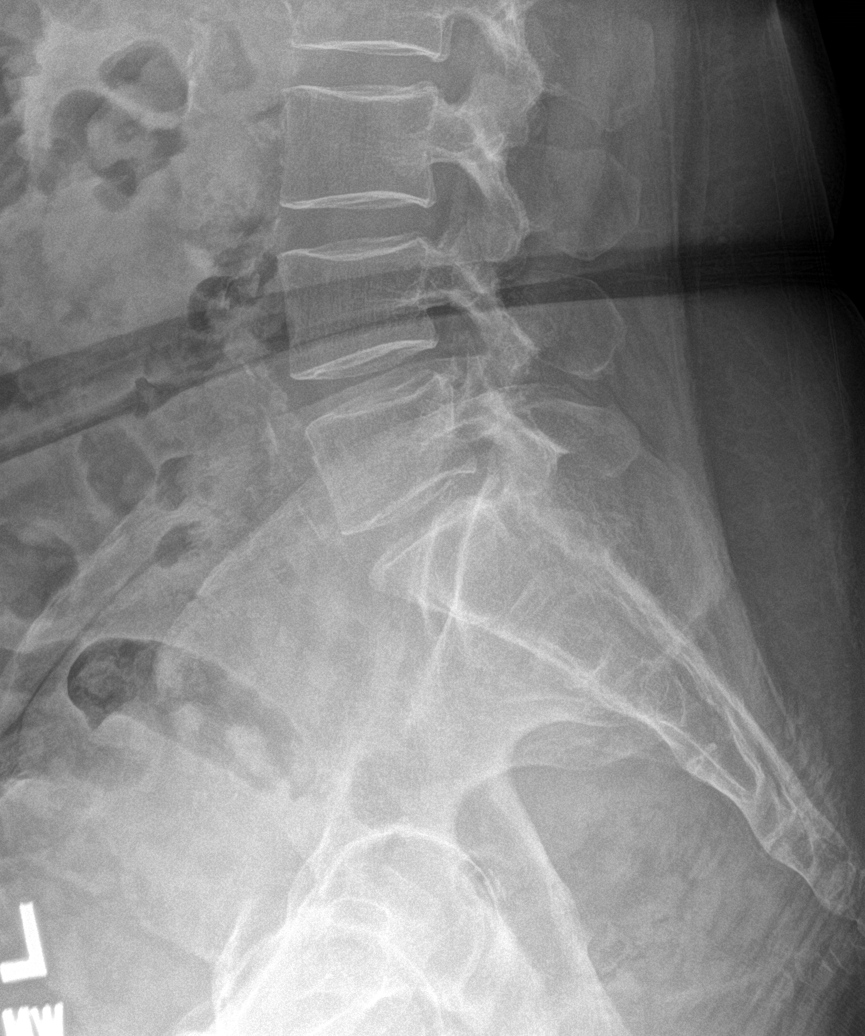

[l-spine obl (3 of 3)]
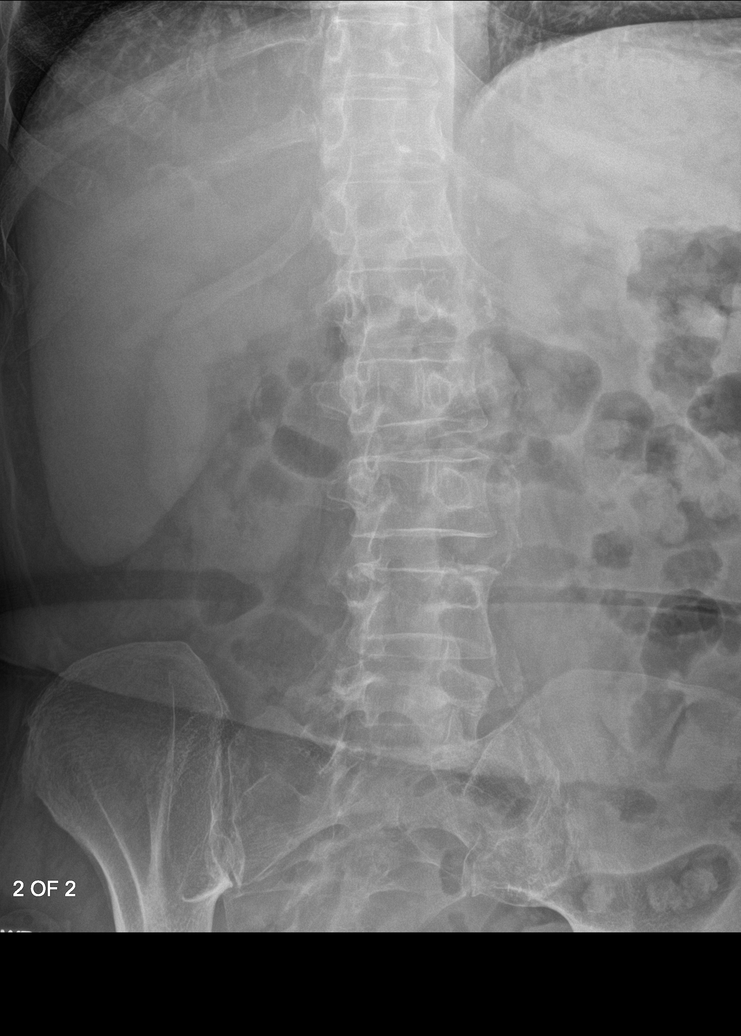

[6 of 6 positions shown; findings below may reference images not displayed]

FINDINGS: Examination demonstrates mild diffuse decreased bone mineralization.
Vertebral body alignment and heights are within normal. There is
minimal spondylosis of the lumbar spine. Mild facet arthropathy of
the lower lumbar spine. Very subtle disc space narrowing at the L4-5
and L5-S1 levels. No compression fracture or spondylolisthesis. Mild
calcified plaque over the abdominal aorta and iliac arteries.
IMPRESSION: Minimal spondylosis of the lumbar spine with subtle disc disease at
the L4-5 and L5-S1 levels.

Aortic atherosclerosis.

## 2017-04-27 ENCOUNTER — Ambulatory Visit: Payer: BLUE CROSS/BLUE SHIELD | Admitting: Unknown Physician Specialty

## 2017-05-08 ENCOUNTER — Encounter: Payer: Self-pay | Admitting: Family Medicine

## 2017-05-08 ENCOUNTER — Other Ambulatory Visit: Payer: Self-pay

## 2017-05-08 ENCOUNTER — Ambulatory Visit: Payer: BLUE CROSS/BLUE SHIELD | Admitting: Family Medicine

## 2017-05-08 VITALS — BP 127/74 | HR 60 | Temp 98.1°F | Wt 195.5 lb

## 2017-05-08 DIAGNOSIS — E876 Hypokalemia: Secondary | ICD-10-CM | POA: Diagnosis not present

## 2017-05-08 DIAGNOSIS — R110 Nausea: Secondary | ICD-10-CM

## 2017-05-08 DIAGNOSIS — F419 Anxiety disorder, unspecified: Secondary | ICD-10-CM | POA: Diagnosis not present

## 2017-05-08 MED ORDER — ALPRAZOLAM 0.5 MG PO TABS: 0.5000 mg | ORAL_TABLET | Freq: Two times a day (BID) | ORAL | 0 refills | Status: DC | PRN

## 2017-05-08 NOTE — Progress Notes (Signed)
BP 127/74 (BP Location: Right Arm, Patient Position: Sitting, Cuff Size: Normal)   Pulse 60   Temp 98.1 F (36.7 C) (Oral)   Wt 195 lb 8 oz (88.7 kg)   LMP  (LMP Unknown)   SpO2 96%   BMI 31.55 kg/m    Subjective:    Patient ID: Brandi Steele, female    DOB: Jul 08, 1955, 62 y.o.   MRN: [id number]  HPI: Brandi Steele is a 62 y.o. female  Chief Complaint  Patient presents with  . Abdominal Pain    Patient states Carafate is not working. Ongoing for a few months. Patient states she's wondering about H. Pylori or something else. Patient travels outside of the country a lot on cruises. Last cruise was in March.  . Bloated  . Nausea   Pt here today for eval of bloating and nausea, every morning until about 2 pm the past several months. Denies abdominal pain, dysphagia, vomiting, fevers, early satiety, bowel changes. Trying carafate and nexium with no relief. Concerned about H pylori. Of note, chronic gastritis was found on CT Chest performed 01/2017.   Very rarely taking the xanax, buspar is helping significantly with her anxiety. Does have a lot of familial stress currently so has been leaning on the xanax every once in a while when things become overwhelming. Denies side effects.   Wanting magnesium and potassium levels checked while she's here. Has been doing very well on current supplementation. No leg cramping the past few months.   Past Medical History:  Diagnosis Date  . Anxiety   . Aortic stenosis   . CHF (congestive heart failure) ([place])   . COPD (chronic obstructive pulmonary disease) ([place])   . Depression   . Fibromyalgia   . GERD (gastroesophageal reflux disease)   . Hyperlipidemia   . Hypertension   . Hypertensive heart disease   . Myalgia   . Obesity   . Sciatica   . Seborrheic keratosis   . Stress incontinence    Social History   Socioeconomic History  . Marital status: Married    Spouse name: Not on file  . Number of children: Not on file  . Years of  education: Not on file  . Highest education level: Not on file  Occupational History  . Not on file  Social Needs  . Financial resource strain: Not on file  . Food insecurity:    Worry: Not on file    Inability: Not on file  . Transportation needs:    Medical: Not on file    Non-medical: Not on file  Tobacco Use  . Smoking status: Former Smoker    Packs/day: 0.50    Years: 40.00    Pack years: 20.00    Types: Cigarettes    Last attempt to quit: 11/01/2016    Years since quitting: 0.5  . Smokeless tobacco: Never Used  . Tobacco comment: Is trying cold-turkey with social support  Substance and Sexual Activity  . Alcohol use: No    Alcohol/week: 0.0 oz  . Drug use: No  . Sexual activity: Never  Lifestyle  . Physical activity:    Days per week: Not on file    Minutes per session: Not on file  . Stress: Not on file  Relationships  . Social connections:    Talks on phone: Not on file    Gets together: Not on file    Attends religious service: Not on file    Active member of  club or organization: Not on file    Attends meetings of clubs or organizations: Not on file    Relationship status: Not on file  . Intimate partner violence:    Fear of current or ex partner: Not on file    Emotionally abused: Not on file    Physically abused: Not on file    Forced sexual activity: Not on file  Other Topics Concern  . Not on file  Social History Narrative  . Not on file   Relevant past medical, surgical, family and social history reviewed and updated as indicated. Interim medical history since our last visit reviewed. Allergies and medications reviewed and updated.  Review of Systems  Per HPI unless specifically indicated above     Objective:    BP 127/74 (BP Location: Right Arm, Patient Position: Sitting, Cuff Size: Normal)   Pulse 60   Temp 98.1 F (36.7 C) (Oral)   Wt 195 lb 8 oz (88.7 kg)   LMP  (LMP Unknown)   SpO2 96%   BMI 31.55 kg/m   Wt Readings from Last 3  Encounters:  05/08/17 195 lb 8 oz (88.7 kg)  03/23/17 192 lb 4.8 oz (87.2 kg)  02/12/17 188 lb (85.3 kg)    Physical Exam  Constitutional: She is oriented to person, place, and time. She appears well-developed and well-nourished. No distress.  HENT:  Head: Atraumatic.  Mouth/Throat: Oropharynx is clear and moist.  Eyes: Conjunctivae are normal.  Neck: Normal range of motion. Neck supple.  Cardiovascular: Normal rate and regular rhythm.  Pulmonary/Chest: Effort normal and breath sounds normal.  Abdominal: Soft. Bowel sounds are normal. She exhibits no distension. There is no tenderness. There is no guarding.  Musculoskeletal: Normal range of motion.  Neurological: She is alert and oriented to person, place, and time.  Skin: Skin is warm and dry.  Psychiatric: She has a normal mood and affect. Her behavior is normal.  Tearful at points of the interview when speaking about family issues   Nursing note and vitals reviewed.   Results for orders placed or performed in visit on 40/98/11  Helicobacter pylori abs-IgG+IgA, bld  Result Value Ref Range   H. pylori, IgA Abs WILL FOLLOW    H. pylori, IgG AbS WILL FOLLOW   Basic Metabolic Panel (BMET)  Result Value Ref Range   Glucose 88 65 - 99 mg/dL   BUN 16 8 - 27 mg/dL   Creatinine, Ser 0.83 0.57 - 1.00 mg/dL   GFR calc non Af Amer 75 >59 mL/min/1.73   GFR calc Af Amer 87 >59 mL/min/1.73   BUN/Creatinine Ratio 19 12 - 28   Sodium 143 134 - 144 mmol/L   Potassium 4.6 3.5 - 5.2 mmol/L   Chloride 103 96 - 106 mmol/L   CO2 26 20 - 29 mmol/L   Calcium 9.1 8.7 - 10.3 mg/dL  Magnesium  Result Value Ref Range   Magnesium 1.9 1.6 - 2.3 mg/dL      Assessment & Plan:   Problem List Items Addressed This Visit      Other   Hypomagnesemia    Await lab recheck, continue supplementation      Relevant Orders   Magnesium (Completed)   Anxiety    Stable and under good control, continue current regimen      Relevant Medications    ALPRAZolam (XANAX) 0.5 MG tablet    Other Visit Diagnoses    Nausea    -  Primary   Will  r/o H pylori, if neg will refer to GI as requested for further eval given chronicity and conservative tx failure   Relevant Orders   Helicobacter pylori abs-IgG+IgA, bld (Completed)   Hypokalemia       Await potassium recheck. Continue supplementation and good hydration   Relevant Orders   Basic Metabolic Panel (BMET) (Completed)       Follow up plan: Return for as scheduled.

## 2017-05-10 NOTE — Patient Instructions (Signed)
Follow up as scheduled.  

## 2017-05-10 NOTE — Assessment & Plan Note (Signed)
Await lab recheck, continue supplementation

## 2017-05-10 NOTE — Assessment & Plan Note (Signed)
Stable and under good control, continue current regimen 

## 2017-05-12 ENCOUNTER — Encounter: Payer: Self-pay | Admitting: Unknown Physician Specialty

## 2017-05-12 LAB — BASIC METABOLIC PANEL
BUN/Creatinine Ratio: 19 (ref 12–28)
BUN: 16 mg/dL (ref 8–27)
CALCIUM: 9.1 mg/dL (ref 8.7–10.3)
CO2: 26 mmol/L (ref 20–29)
Chloride: 103 mmol/L (ref 96–106)
Creatinine, Ser: 0.83 mg/dL (ref 0.57–1.00)
GFR, EST AFRICAN AMERICAN: 87 mL/min/{1.73_m2} (ref >59)
GFR, EST NON AFRICAN AMERICAN: 75 mL/min/{1.73_m2} (ref >59)
Glucose: 88 mg/dL (ref 65–99)
Potassium: 4.6 mmol/L (ref 3.5–5.2)
Sodium: 143 mmol/L (ref 134–144)

## 2017-05-12 LAB — HELICOBACTER PYLORI ABS-IGG+IGA, BLD
H. pylori, IgA Abs: <9 units (ref 0.0–8.9)
H. pylori, IgG AbS: <0.8 Index Value (ref 0.00–0.79)

## 2017-05-12 LAB — MAGNESIUM: Magnesium: 1.9 mg/dL (ref 1.6–2.3)

## 2017-10-22 ENCOUNTER — Ambulatory Visit: Payer: Self-pay

## 2017-10-22 NOTE — Telephone Encounter (Signed)
Pt. Reports she noticed a "bruise" to the top of her right foot Sunday. Has not had any injury to that foot. No pain. Pt. Concerned because she has had circulatory issues in the past with surgery. Her daughter, who is a Marine scientist, checked her pedal pulses - the right was "a little weaker than the left." Pt. Wants "to be sure I'm not having that problem again." Appointment made for tomorrow.  Reason for Disposition . [1] Not caused by an injury AND [2] < 5 unexplained bruises  Answer Assessment - Initial Assessment Questions 1. APPEARANCE of BRUISE: "Describe the bruise."      Color - grey-brown 2. SIZE: "How large is the bruise?"      1 inch x 1/2 inch 3. NUMBER: "How many bruises are there?"      1  4. LOCATION: "Where is the bruise located?"      Top of right foot 5. ONSET: "How long ago did the bruise occur?"      Sunday 6. CAUSE: "Tell me how it happened."     No injury 7. MEDICAL HISTORY: "Do you have any medical problems that can cause easy bruising or bleeding?" (e.g., leukemia, liver disease, recent chemotherapy)     No 8. MEDICATIONS : "Do you take any medications which thin the blood such as: aspirin, heparin, ibuprofen (NSAIDS), Plavix, or Coumadin?"     Plavix 9. OTHER SYMPTOMS: "Do you have any other symptoms?"  (e.g., weakness, dizziness, pain, fever, nosebleed, blood in urine/stool)     No 10. PREGNANCY: "Is there any chance you are pregnant?" "When was your last menstrual period?"       No  Protocols used: BRUISES-A-AH

## 2017-10-23 ENCOUNTER — Ambulatory Visit (INDEPENDENT_AMBULATORY_CARE_PROVIDER_SITE_OTHER): Payer: Medicaid Other | Admitting: Family Medicine

## 2017-10-23 ENCOUNTER — Encounter: Payer: Self-pay | Admitting: Family Medicine

## 2017-10-23 ENCOUNTER — Other Ambulatory Visit: Payer: Self-pay

## 2017-10-23 VITALS — BP 130/85 | HR 69 | Temp 98.0°F | Wt 196.0 lb

## 2017-10-23 DIAGNOSIS — Z23 Encounter for immunization: Secondary | ICD-10-CM | POA: Diagnosis not present

## 2017-10-23 DIAGNOSIS — M797 Fibromyalgia: Secondary | ICD-10-CM

## 2017-10-23 DIAGNOSIS — T148XXA Other injury of unspecified body region, initial encounter: Secondary | ICD-10-CM | POA: Diagnosis not present

## 2017-10-23 DIAGNOSIS — Z87440 Personal history of urinary (tract) infections: Secondary | ICD-10-CM | POA: Diagnosis not present

## 2017-10-23 MED ORDER — CIPROFLOXACIN HCL 250 MG PO TABS: 250.0000 mg | ORAL_TABLET | Freq: Two times a day (BID) | ORAL | 0 refills | Status: DC

## 2017-10-23 MED ORDER — ACETAMINOPHEN-CODEINE #3 300-30 MG PO TABS: 1.0000 | ORAL_TABLET | Freq: Two times a day (BID) | ORAL | 0 refills | Status: DC | PRN

## 2017-10-23 NOTE — Progress Notes (Signed)
BP 130/85   Pulse 69   Temp 98 F (36.7 C) (Oral)   Wt 196 lb (88.9 kg)   LMP  (LMP Unknown)   SpO2 97%   BMI 31.64 kg/m    Subjective:    Patient ID: Brandi Steele, female    DOB: 04-Feb-1955, 62 y.o.   MRN: [id number]  HPI: Brandi Steele is a 62 y.o. female  Chief Complaint  Patient presents with  . Foot problems    pt states has had a dark color/bruise on her right big toe x 1 week   Here today concerned about a bruise that has formed at base of right great toe the past week. Does not remember any injury and having minimal tenderness in the area. Has had bruising on LE before that ended up being a major blockage to aorta so she was concerned. Is on plavix.   Going on a long cruise soon and states she usually takes cipro with her in case she gets a UTI while away. Would also like some tylenol #3 in ase her fibromyalgia and arthritis in back flare up while away.   Past Medical History:  Diagnosis Date  . Anxiety   . Aortic stenosis   . CHF (congestive heart failure) ([place])   . COPD (chronic obstructive pulmonary disease) ([place])   . Depression   . Fibromyalgia   . GERD (gastroesophageal reflux disease)   . Hyperlipidemia   . Hypertension   . Hypertensive heart disease   . Myalgia   . Obesity   . Sciatica   . Seborrheic keratosis   . Stress incontinence    Social History   Socioeconomic History  . Marital status: Married    Spouse name: Not on file  . Number of children: Not on file  . Years of education: Not on file  . Highest education level: Not on file  Occupational History  . Not on file  Social Needs  . Financial resource strain: Not on file  . Food insecurity:    Worry: Not on file    Inability: Not on file  . Transportation needs:    Medical: Not on file    Non-medical: Not on file  Tobacco Use  . Smoking status: Former Smoker    Packs/day: 0.50    Years: 40.00    Pack years: 20.00    Types: Cigarettes    Last attempt to quit: 11/01/2016    Years since quitting: 0.9  . Smokeless tobacco: Never Used  . Tobacco comment: Is trying cold-turkey with social support  Substance and Sexual Activity  . Alcohol use: No    Alcohol/week: 0.0 standard drinks  . Drug use: No  . Sexual activity: Never  Lifestyle  . Physical activity:    Days per week: Not on file    Minutes per session: Not on file  . Stress: Not on file  Relationships  . Social connections:    Talks on phone: Not on file    Gets together: Not on file    Attends religious service: Not on file    Active member of club or organization: Not on file    Attends meetings of clubs or organizations: Not on file    Relationship status: Not on file  . Intimate partner violence:    Fear of current or ex partner: Not on file    Emotionally abused: Not on file    Physically abused: Not on file    Forced  sexual activity: Not on file  Other Topics Concern  . Not on file  Social History Narrative  . Not on file   Relevant past medical, surgical, family and social history reviewed and updated as indicated. Interim medical history since our last visit reviewed. Allergies and medications reviewed and updated.  Review of Systems  Per HPI unless specifically indicated above     Objective:    BP 130/85   Pulse 69   Temp 98 F (36.7 C) (Oral)   Wt 196 lb (88.9 kg)   LMP  (LMP Unknown)   SpO2 97%   BMI 31.64 kg/m   Wt Readings from Last 3 Encounters:  10/23/17 196 lb (88.9 kg)  05/08/17 195 lb 8 oz (88.7 kg)  03/23/17 192 lb 4.8 oz (87.2 kg)    Physical Exam  Constitutional: She is oriented to person, place, and time. She appears well-developed and well-nourished. No distress.  HENT:  Head: Atraumatic.  Eyes: Conjunctivae and EOM are normal.  Neck: Normal range of motion. Neck supple.  Cardiovascular: Normal rate, regular rhythm and intact distal pulses.  Good cap refill b/l feet  Pulmonary/Chest: Effort normal and breath sounds normal.  Abdominal: Soft. Bowel  sounds are normal.  Musculoskeletal: Normal range of motion.  Neurological: She is alert and oriented to person, place, and time.  Skin: Skin is warm and dry.  Minimal bruising at base of right great toe. Mild TTP over area  Psychiatric: She has a normal mood and affect. Her behavior is normal.  Nursing note and vitals reviewed.   Results for orders placed or performed in visit on 16/01/09  Helicobacter pylori abs-IgG+IgA, bld  Result Value Ref Range   H. pylori, IgA Abs <9.0 0.0 - 8.9 units   H. pylori, IgG AbS <0.80 0.00 - 0.79 Index Value  Basic Metabolic Panel (BMET)  Result Value Ref Range   Glucose 88 65 - 99 mg/dL   BUN 16 8 - 27 mg/dL   Creatinine, Ser 0.83 0.57 - 1.00 mg/dL   GFR calc non Af Amer 75 >59 mL/min/1.73   GFR calc Af Amer 87 >59 mL/min/1.73   BUN/Creatinine Ratio 19 12 - 28   Sodium 143 134 - 144 mmol/L   Potassium 4.6 3.5 - 5.2 mmol/L   Chloride 103 96 - 106 mmol/L   CO2 26 20 - 29 mmol/L   Calcium 9.1 8.7 - 10.3 mg/dL  Magnesium  Result Value Ref Range   Magnesium 1.9 1.6 - 2.3 mg/dL      Assessment & Plan:   Problem List Items Addressed This Visit      Other   Fibromyalgia    Will rx small amount of Tylenol #3 in case severe flare during travels. Sedation and addiction precautions reviewed       Other Visit Diagnoses    Bruise    -  Primary   Appears benign, exam reassuring and some ttp. Likely from sandal rubbing the area and the plavix. Continue to monitor   Flu vaccine need       History of UTI       Cipro given to have on hand for trip, knows not to use it if not needed       Follow up plan: Return in about 4 weeks (around 11/20/2017) for 6 month f/u.

## 2017-10-26 NOTE — Patient Instructions (Addendum)
Follow up as scheduled.  

## 2017-10-26 NOTE — Assessment & Plan Note (Signed)
Will rx small amount of Tylenol #3 in case severe flare during travels. Sedation and addiction precautions reviewed

## 2017-11-09 ENCOUNTER — Other Ambulatory Visit: Payer: Self-pay | Admitting: Family Medicine

## 2017-11-10 NOTE — Telephone Encounter (Signed)
Requested Prescriptions  Pending Prescriptions Disp Refills  . esomeprazole (NEXIUM) 40 MG capsule [Pharmacy Med Name: ESOMEPRAZOLE MAGNESIUM 40MG  DR CAPS] 180 capsule 0    Sig: TAKE 1 CAPSULE BY MOUTH TWICE DAILY BEFORE A MEAL     Gastroenterology: Proton Pump Inhibitors Passed - 11/09/2017  2:37 PM      Passed - Valid encounter within last 12 months    Recent Outpatient Visits          2 weeks ago [place], [place]   6 months ago Nausea   [place], [place], [place]   7 months ago [place], [place], [place]   10 months ago [place], [place]   11 months ago Hypomagnesemia   [place], [place], [place]

## 2017-11-22 ENCOUNTER — Other Ambulatory Visit: Payer: Self-pay | Admitting: Unknown Physician Specialty

## 2017-11-23 NOTE — Telephone Encounter (Signed)
Routing to close.  °

## 2017-11-23 NOTE — Telephone Encounter (Signed)
Refill approved.  Recent visit with Apolonio Schneiders in September.

## 2017-11-23 NOTE — Telephone Encounter (Signed)
Please review med for refill: lisinopril 2.5 mg   Historical provider  LOV  10/23/17  PCP  Merrie Roof, [place] # [phone]  [address] [place]

## 2017-11-24 ENCOUNTER — Ambulatory Visit: Payer: Self-pay | Admitting: *Deleted

## 2017-11-24 ENCOUNTER — Encounter: Payer: Self-pay | Admitting: Family Medicine

## 2017-11-24 ENCOUNTER — Ambulatory Visit (INDEPENDENT_AMBULATORY_CARE_PROVIDER_SITE_OTHER): Payer: Medicaid Other | Admitting: Family Medicine

## 2017-11-24 VITALS — BP 112/73 | HR 80 | Temp 98.8°F | Wt 198.0 lb

## 2017-11-24 DIAGNOSIS — L03114 Cellulitis of left upper limb: Secondary | ICD-10-CM | POA: Diagnosis not present

## 2017-11-24 MED ORDER — SULFAMETHOXAZOLE-TRIMETHOPRIM 800-160 MG PO TABS: 1.0000 | ORAL_TABLET | Freq: Two times a day (BID) | ORAL | 0 refills | Status: DC

## 2017-11-24 NOTE — Telephone Encounter (Signed)
  I returned her call.   She had a flu shot on Saturday at [facility] that is located in the same parking lot as the practice.  Her arm is now very red, swollen and hot to the touch and is spreading along with being very sore.   She had cellulitis from a flu shot before that had these same symptoms.   She mentioned she is going on a 15 day cruise and she is leaving Saturday.   She wants to get this evaluated as soon as possible.  Merrie Roof, PA did not have any availability.   Pt kept asking,  "Can't I just come in and let her look at it and get antibiotics"?[name]   "It would only take a minute".   I let her know Apolonio Schneiders is booked today and we can't do walk ins.   She would need to be scheduled.   She asked if she could be worked in with [employer].   I called the flow coordinator and spoke with Christan to see if she could be worked in.   She could not but there was an opening with Dr. Wynetta Emery today at 2:30.   Pt was ok with being scheduled for this time so I Skyped Christan back and let her know.   Christan put her on the schedule to see Dr. Wynetta Emery today at 2:30.        Reason for Disposition . [1] Redness or red streak around the injection site AND [2] begins > 48 hours after shot AND [3] fever  Answer Assessment - Initial Assessment Questions 1. SYMPTOMS: "What is the main symptom?" (e.g., redness, swelling, pain)      Had flu shot in left arm on Saturday.   My arm is so swollen and sore.   It has a redness like a bad sunburn that is growing.   2. ONSET: "When was the vaccine (shot) given?" "How much later did the Saturday__ begin?" (e.g., hours, days ago)      *No Answer* 3. SEVERITY: "How bad is it?"      It's 4 inches and 6 inches wide.   That area is really hot to the touch and sore. 4. FEVER: "Is there a fever?" If so, ask: "What is it, how was it measured, and when did it start?"      I had cellulitis before from a flu shot.   This shot I got at [facility] here in our parking lot. 5.  IMMUNIZATIONS GIVEN: "What shots have you recently received?"     Flu shot Saturday. 6. PAST REACTIONS: "Have you reacted to immunizations before?" If so, ask: "What happened?"     Yes   I think it was last year I can't remember. 7. OTHER SYMPTOMS: "Do you have any other symptoms?"     No I'm leaving for a cruise on Saturday for 15 days.   I want to get this taken care of before I leave on Saturday.  Protocols used: IMMUNIZATION REACTIONS-A-AH

## 2017-11-24 NOTE — Progress Notes (Signed)
BP 112/73   Pulse 80   Temp 98.8 F (37.1 C) (Oral)   Wt 198 lb (89.8 kg)   LMP  (LMP Unknown)   SpO2 95%   BMI 31.96 kg/m    Subjective:    Patient ID: Brandi Steele, female    DOB: 07-23-1955, 62 y.o.   MRN: [id number]  HPI: [name] is a 62 y.o. female  Chief Complaint  Patient presents with  . Rash    Got Sunday at [employer]. Started Sunday evening. Painful, warm to touch.    SKIN INFECTION Duration: 3 days Location: L arm History of trauma in area: yes- just got her flu shot Pain: yes Quality: aching Severity: moderate Redness: yes Swelling: yes Oozing: no Pus: no Fevers: no Nausea/vomiting: no Status: worse Treatments attempted:none  Tetanus: UTD  Relevant past medical, surgical, family and social history reviewed and updated as indicated. Interim medical history since our last visit reviewed. Allergies and medications reviewed and updated.  Review of Systems  Constitutional: Negative.   Respiratory: Negative.   Cardiovascular: Negative.   Skin: Positive for color change. Negative for pallor, rash and wound.  Psychiatric/Behavioral: Negative.     Per HPI unless specifically indicated above     Objective:    BP 112/73   Pulse 80   Temp 98.8 F (37.1 C) (Oral)   Wt 198 lb (89.8 kg)   LMP  (LMP Unknown)   SpO2 95%   BMI 31.96 kg/m   Wt Readings from Last 3 Encounters:  11/24/17 198 lb (89.8 kg)  10/23/17 196 lb (88.9 kg)  05/08/17 195 lb 8 oz (88.7 kg)    Physical Exam  Constitutional: She is oriented to person, place, and time. She appears well-developed and well-nourished. No distress.  HENT:  Head: Normocephalic and atraumatic.  Right Ear: Hearing normal.  Left Ear: Hearing normal.  Nose: Nose normal.  Eyes: Conjunctivae and lids are normal. Right eye exhibits no discharge. Left eye exhibits no discharge. No scleral icterus.  Cardiovascular: Normal rate, regular rhythm, normal heart sounds and intact distal pulses. Exam  reveals no gallop and no friction rub.  No murmur heard. Pulmonary/Chest: Effort normal and breath sounds normal. No stridor. No respiratory distress. She has no wheezes. She has no rales. She exhibits no tenderness.  Musculoskeletal: Normal range of motion.  Neurological: She is alert and oriented to person, place, and time.  Skin: Skin is warm, dry and intact. Capillary refill takes less than 2 seconds. No rash noted. She is not diaphoretic. There is erythema. No pallor.  Red, hot area without induration on L arm  Psychiatric: She has a normal mood and affect. Her speech is normal and behavior is normal. Judgment and thought content normal. Cognition and memory are normal.    Results for orders placed or performed in visit on 02/77/41  Helicobacter pylori abs-IgG+IgA, bld  Result Value Ref Range   H. pylori, IgA Abs <9.0 0.0 - 8.9 units   H. pylori, IgG AbS <0.80 0.00 - 0.79 Index Value  Basic Metabolic Panel (BMET)  Result Value Ref Range   Glucose 88 65 - 99 mg/dL   BUN 16 8 - 27 mg/dL   Creatinine, Ser 0.83 0.57 - 1.00 mg/dL   GFR calc non Af Amer 75 >59 mL/min/1.73   GFR calc Af Amer 87 >59 mL/min/1.73   BUN/Creatinine Ratio 19 12 - 28   Sodium 143 134 - 144 mmol/L   Potassium 4.6 3.5 -  5.2 mmol/L   Chloride 103 96 - 106 mmol/L   CO2 26 20 - 29 mmol/L   Calcium 9.1 8.7 - 10.3 mg/dL  Magnesium  Result Value Ref Range   Magnesium 1.9 1.6 - 2.3 mg/dL      Assessment & Plan:   Problem List Items Addressed This Visit    None    Visit Diagnoses    Cellulitis of left upper arm    -  Primary   Will treat with bactrim. Take with food. Call if not getting better or getting worse.        Follow up plan: Return if symptoms worsen or fail to improve.

## 2017-12-16 ENCOUNTER — Telehealth: Payer: Self-pay | Admitting: Family Medicine

## 2017-12-16 ENCOUNTER — Ambulatory Visit: Payer: Self-pay | Admitting: *Deleted

## 2017-12-16 NOTE — Telephone Encounter (Signed)
Copied from [place] [phone]. Topic: General - Other >> Dec 16, 2017  3:02 PM Janace Aris A wrote: Medication: furosemide (LASIX) 40 MG tablet   Has the patient contacted their pharmacy? Yes   Preferred Pharmacy (with phone number or street name): [id number] - [address], [place]  [phone] (Phone) [phone] (Fax)    Agent: Please be advised that RX refills may take up to 3 business days. We ask that you follow-up with your pharmacy.

## 2017-12-16 NOTE — Telephone Encounter (Signed)
Called pt regarding her refill request of lasix 40 mg tab. This medication was taken off her med list in 2018.  Pt stated that she had gone on a 15 day cruise and taken a 5.5 hour flight back to [place] and now has swelling in her ankles, feet and hands.  She has a bottle of lasix 40 mg and took a dose. She got winded when she got up to go to the bathroom. She stated  Shy she was  talking on the phone she stated that she had voided and her rings on her fingers were loosening up.  From her last visit  to the office and back from her trip, she had gained 10 lbs. No redness or infection noted in her ankles or feet. Per protocol she needs to be seen within 3 days. She does not want to see another provider only her pcp. She will get to the emergency department if she starts exzperiencing worsening symptoms including swelling up to her knees, redness, and sob without exertion and a weight gain of 4 lb over night. Pt voiced understanding.   Reason for Disposition . [1] MILD swelling of both ankles (i.e., pedal edema) AND [2] new onset or worsening  Answer Assessment - Initial Assessment Questions 1. ONSET: "When did the swelling start?" (e.g., minutes, hours, days)     Last night 2. LOCATION: "What part of the leg is swollen?"  "Are both legs swollen or just one leg?"      Feet and ankles 3. SEVERITY: "How bad is the swelling?" (e.g., localized; mild, moderate, severe)  - Localized - small area of swelling localized to one leg  - MILD pedal edema - swelling limited to foot and ankle, pitting edema < 1/4 inch (6 mm) deep, rest and elevation eliminate most or all swelling  - MODERATE edema - swelling of lower leg to knee, pitting edema > 1/4 inch (6 mm) deep, rest and elevation only partially reduce swelling  - SEVERE edema - swelling extends above knee, facial or hand swelling present      mild 4. REDNESS: "Does the swelling look red or infected?"     no 5. PAIN: "Is the swelling painful to touch?" If  so, ask: "How painful is it?"   (Scale 1-10; mild, moderate or severe)     no 6. FEVER: "Do you have a fever?" If so, ask: "What is it, how was it measured, and when did it start?"      no 7. CAUSE: "What do you think is causing the leg swelling?"     Being on a cruise for 15 days and on a flight for 5.5 hours 8. MEDICAL HISTORY: "Do you have a history of heart failure, kidney disease, liver failure, or cancer?"     Heart failure 9. RECURRENT SYMPTOM: "Have you had leg swelling before?" If so, ask: "When was the last time?" "What happened that time?"     Yes and was diagnosed with CHF in 2011. 10. OTHER SYMPTOMS: "Do you have any other symptoms?" (e.g., chest pain, difficulty breathing)       Shortness of breath with moving 11. PREGNANCY: "Is there any chance you are pregnant?" "When was your last menstrual period?"       n/a  Protocols used: LEG SWELLING AND EDEMA-A-AH

## 2017-12-17 NOTE — Telephone Encounter (Signed)
Spoke with pt and she stated that she was taking the medication that she had at home. She stated that she had gained 10 pounds of water weight and she started the medication last night and lost 4 pounds. She stated that her daughter works at the ED and she is checking on her and she will go to the hospital if it needed. She said she would rather see Brandi Steele instead of multiple providers so I have scheduled her at the next available morning, Dec 3rd.

## 2017-12-17 NOTE — Telephone Encounter (Signed)
Agree with below. Please see if you can get her scheduled with Apolonio Schneiders on her return since she will not see another provider.

## 2017-12-17 NOTE — Telephone Encounter (Signed)
FYI- seeing you Dec 3

## 2017-12-29 ENCOUNTER — Encounter: Payer: Self-pay | Admitting: Family Medicine

## 2017-12-29 ENCOUNTER — Ambulatory Visit (INDEPENDENT_AMBULATORY_CARE_PROVIDER_SITE_OTHER): Payer: Medicaid Other | Admitting: Family Medicine

## 2017-12-29 VITALS — BP 123/62 | HR 81 | Temp 98.5°F | Ht 65.5 in | Wt 200.3 lb

## 2017-12-29 DIAGNOSIS — E876 Hypokalemia: Secondary | ICD-10-CM | POA: Diagnosis not present

## 2017-12-29 DIAGNOSIS — L989 Disorder of the skin and subcutaneous tissue, unspecified: Secondary | ICD-10-CM

## 2017-12-29 DIAGNOSIS — R0602 Shortness of breath: Secondary | ICD-10-CM | POA: Diagnosis not present

## 2017-12-29 MED ORDER — FUROSEMIDE 40 MG PO TABS: 40.0000 mg | ORAL_TABLET | Freq: Every day | ORAL | 3 refills | Status: DC | PRN

## 2017-12-29 NOTE — Patient Instructions (Signed)
hibiclens

## 2017-12-29 NOTE — Progress Notes (Signed)
BP 123/62 (BP Location: Left Arm, Patient Position: Sitting, Cuff Size: Normal)   Pulse 81   Temp 98.5 F (36.9 C) (Oral)   Ht 5' 5.5" (1.664 m)   Wt 200 lb 4.8 oz (90.9 kg)   LMP  (LMP Unknown)   SpO2 94%   BMI 32.82 kg/m    Subjective:    Patient ID: Brandi Steele, female    DOB: [birth year], 62 y.o.   MRN: [id number]  HPI: Brandi Steele is a 63 y.o. female  Chief Complaint  Patient presents with  . Edema    Came back from [place] with fluid in patient's lungs. Was taken off Lasix due to labs being off. Would like a new Rx for fluid just in case.  . Labs    Potassium, Magnesium, Calcium  . Medication Refill   Since her cruise a few weeks ago has been having SOB when laying down mostly (which she attributes to fluid in her chest) and had gained 10 lb of fluid. Took a couple old lasix tablets (had been taken off in the past but taken off due to electrolyte abnormalities) with good relief. Was taking 40 mg once daily on her lasix x 3-4 days and was back to dry weight. Taking extra potassium and magnesium to combat the lasix. Feeling very well now, back to baseline with no LE edema or orthopnea. Denies CP, SOB, chest tightness, cough.   Relevant past medical, surgical, family and social history reviewed and updated as indicated. Interim medical history since our last visit reviewed. Allergies and medications reviewed and updated.  Review of Systems  Per HPI unless specifically indicated above     Objective:    BP 123/62 (BP Location: Left Arm, Patient Position: Sitting, Cuff Size: Normal)   Pulse 81   Temp 98.5 F (36.9 C) (Oral)   Ht 5' 5.5" (1.664 m)   Wt 200 lb 4.8 oz (90.9 kg)   LMP  (LMP Unknown)   SpO2 94%   BMI 32.82 kg/m   Wt Readings from Last 3 Encounters:  12/29/17 200 lb 4.8 oz (90.9 kg)  11/24/17 198 lb (89.8 kg)  10/23/17 196 lb (88.9 kg)    Physical Exam  Constitutional: She is oriented to person, place, and time. She appears well-developed and  well-nourished. No distress.  HENT:  Head: Atraumatic.  Eyes: Conjunctivae and EOM are normal.  Neck: Normal range of motion. Neck supple.  Cardiovascular: Normal rate, regular rhythm, normal heart sounds and intact distal pulses.  Pulmonary/Chest: Effort normal and breath sounds normal. No respiratory distress. She has no rales.  Musculoskeletal: Normal range of motion. She exhibits no edema or tenderness.  Neurological: She is alert and oriented to person, place, and time.  Skin: Skin is warm and dry.  Irritated large blackhead in right axilla  Psychiatric: She has a normal mood and affect. Her behavior is normal.  Nursing note and vitals reviewed.   Results for orders placed or performed in visit on 94/85/46  Basic Metabolic Panel (BMET)  Result Value Ref Range   Glucose 95 65 - 99 mg/dL   BUN 10 8 - 27 mg/dL   Creatinine, Ser 0.85 0.57 - 1.00 mg/dL   GFR calc non Af Amer 73 >59 mL/min/1.73   GFR calc Af Amer 84 >59 mL/min/1.73   BUN/Creatinine Ratio 12 12 - 28   Sodium 142 134 - 144 mmol/L   Potassium 4.4 3.5 - 5.2 mmol/L   Chloride 102 96 -  106 mmol/L   CO2 26 20 - 29 mmol/L   Calcium 9.3 8.7 - 10.3 mg/dL  Magnesium  Result Value Ref Range   Magnesium 1.3 (L) 1.6 - 2.3 mg/dL      Assessment & Plan:   Problem List Items Addressed This Visit      Other   Hypomagnesemia   Relevant Orders   Magnesium (Completed)    Other Visit Diagnoses    SOB (shortness of breath)    -  Primary   Resolved with several days of lasix. Will keep supply on hand for prn use but pt aware to come in with recurring sxs. Monitor weights, salt restriction   Hypokalemia       Recheck electrolytes given recent lasix use. Stay well hydrated, continue supplementation   Relevant Orders   Basic Metabolic Panel (BMET) (Completed)   Skin lesion       right axilla, appears to be a large blackhead. Warm compresses, pressure. Keep area clean and dry. Derm referral if changing or growing        Follow up plan: Return for as scheduled.

## 2017-12-30 LAB — BASIC METABOLIC PANEL
BUN/Creatinine Ratio: 12 (ref 12–28)
BUN: 10 mg/dL (ref 8–27)
CALCIUM: 9.3 mg/dL (ref 8.7–10.3)
CHLORIDE: 102 mmol/L (ref 96–106)
CO2: 26 mmol/L (ref 20–29)
CREATININE: 0.85 mg/dL (ref 0.57–1.00)
GFR calc Af Amer: 84 mL/min/{1.73_m2} (ref >59)
GFR, EST NON AFRICAN AMERICAN: 73 mL/min/{1.73_m2} (ref >59)
GLUCOSE: 95 mg/dL (ref 65–99)
Potassium: 4.4 mmol/L (ref 3.5–5.2)
Sodium: 142 mmol/L (ref 134–144)

## 2017-12-30 LAB — MAGNESIUM: MAGNESIUM: 1.3 mg/dL — AB (ref 1.6–2.3)

## 2018-01-11 ENCOUNTER — Other Ambulatory Visit: Payer: Self-pay | Admitting: Nurse Practitioner

## 2018-01-21 ENCOUNTER — Other Ambulatory Visit: Payer: Self-pay

## 2018-01-21 ENCOUNTER — Encounter: Payer: Self-pay | Admitting: Family Medicine

## 2018-01-21 ENCOUNTER — Ambulatory Visit: Payer: Medicaid Other | Admitting: Family Medicine

## 2018-01-21 VITALS — BP 115/77 | HR 93 | Temp 97.8°F | Ht 65.5 in | Wt 200.0 lb

## 2018-01-21 DIAGNOSIS — H6123 Impacted cerumen, bilateral: Secondary | ICD-10-CM

## 2018-01-21 MED ORDER — FLUTICASONE PROPIONATE 50 MCG/ACT NA SUSP: 2.0000 | Freq: Two times a day (BID) | NASAL | 6 refills | Status: DC

## 2018-01-21 MED ORDER — CETIRIZINE HCL 10 MG PO TABS: 10.0000 mg | ORAL_TABLET | Freq: Every day | ORAL | 11 refills | Status: DC

## 2018-01-21 NOTE — Progress Notes (Signed)
BP 115/77   Pulse 93   Temp 97.8 F (36.6 C) (Oral)   Ht 5' 5.5" (1.664 m)   Wt 200 lb (90.7 kg)   LMP  (LMP Unknown)   SpO2 95%   BMI 32.78 kg/m    Subjective:    Patient ID: Brandi Steele, female    DOB: 25-Sep-1955, 62 y.o.   MRN: [id number]  HPI: Brandi Steele is a 62 y.o. female  Chief Complaint  Patient presents with  . Earwax removal    pt states that she might need earwax removal as she feels ears discomfort [place]   Long hx of ear issues, including recurrent infections, eustachian tube dysfunction, and cerumen impaction. Has been on several long flights recently and noticing sharp stabbing pains during the descent and ascent. Took some sudafed before one flight but her heart raced too much so didn't do that again. Still having muffled hearing. Not trying anything else. No fevers, chills, discharge, headache.   Relevant past medical, surgical, family and social history reviewed and updated as indicated. Interim medical history since our last visit reviewed. Allergies and medications reviewed and updated.  Review of Systems  Per HPI unless specifically indicated above     Objective:    BP 115/77   Pulse 93   Temp 97.8 F (36.6 C) (Oral)   Ht 5' 5.5" (1.664 m)   Wt 200 lb (90.7 kg)   LMP  (LMP Unknown)   SpO2 95%   BMI 32.78 kg/m   Wt Readings from Last 3 Encounters:  01/21/18 200 lb (90.7 kg)  12/29/17 200 lb 4.8 oz (90.9 kg)  11/24/17 198 lb (89.8 kg)    Physical Exam Vitals signs and nursing note reviewed.  Constitutional:      Appearance: Normal appearance. She is not ill-appearing.  HENT:     Head: Atraumatic.     Right Ear: There is impacted cerumen.     Left Ear: There is impacted cerumen.     Nose: Nose normal.     Mouth/Throat:     Mouth: Mucous membranes are moist.     Pharynx: Oropharynx is clear.  Eyes:     Extraocular Movements: Extraocular movements intact.     Conjunctiva/sclera: Conjunctivae normal.  Neck:       Musculoskeletal: Normal range of motion and neck supple.  Cardiovascular:     Rate and Rhythm: Normal rate and regular rhythm.     Heart sounds: Normal heart sounds.  Pulmonary:     Effort: Pulmonary effort is normal.     Breath sounds: Normal breath sounds.  Musculoskeletal: Normal range of motion.  Skin:    General: Skin is warm and dry.  Neurological:     Mental Status: She is alert and oriented to person, place, and time.  Psychiatric:        Mood and Affect: Mood normal.        Thought Content: Thought content normal.        Judgment: Judgment normal.    Procedure: B/l ear lavage Procedure explained in detail, questions answered. Warm water was used to lavage ears bilaterally with full clearance of cerumen. TMs visualized well subsequently. Procedure tolerated well with no immediate side effects noted.   Results for orders placed or performed in visit on 00/86/76  Basic Metabolic Panel (BMET)  Result Value Ref Range   Glucose 95 65 - 99 mg/dL   BUN 10 8 - 27 mg/dL  Creatinine, Ser 0.85 0.57 - 1.00 mg/dL   GFR calc non Af Amer 73 >59 mL/min/1.73   GFR calc Af Amer 84 >59 mL/min/1.73   BUN/Creatinine Ratio 12 12 - 28   Sodium 142 134 - 144 mmol/L   Potassium 4.4 3.5 - 5.2 mmol/L   Chloride 102 96 - 106 mmol/L   CO2 26 20 - 29 mmol/L   Calcium 9.3 8.7 - 10.3 mg/dL  Magnesium  Result Value Ref Range   Magnesium 1.3 (L) 1.6 - 2.3 mg/dL      Assessment & Plan:   Problem List Items Addressed This Visit    None    Visit Diagnoses    Bilateral impacted cerumen    -  Primary   Lavage performed with good relief. Debrox drops several times weekly for maintenance. Continue good allergy regimen       Follow up plan: Return for as scheduled.

## 2018-01-21 NOTE — Patient Instructions (Addendum)
Use flonase twice daily (point up and out toward corners of eyes) Hibiclens solution for antibacterial wash  Debrox drops several times weekly for maintenance to keep the wax load down

## 2018-01-23 ENCOUNTER — Other Ambulatory Visit: Payer: Self-pay | Admitting: Family Medicine

## 2018-02-06 ENCOUNTER — Telehealth: Payer: Self-pay

## 2018-02-06 NOTE — Telephone Encounter (Signed)
Call pt regarding lung screening. Left message for pt to return call.  

## 2018-02-16 ENCOUNTER — Telehealth: Payer: Self-pay | Admitting: *Deleted

## 2018-02-16 ENCOUNTER — Encounter: Payer: Self-pay | Admitting: *Deleted

## 2018-02-16 DIAGNOSIS — Z122 Encounter for screening for malignant neoplasm of respiratory organs: Secondary | ICD-10-CM

## 2018-02-16 NOTE — Telephone Encounter (Signed)
Patient has been notified that the annual lung cancer screening low dose CT scan is due currently or will be in the near future.  Confirmed that the patient is within the age range of 72-80, and asymptomatic, and currently exhibits no signs or symptoms of lung cancer.  Patient denies illness that would prevent curative treatment for lung cancer if found.  Verified smoking history, former smoker quit 10--2018 with 40pkyr history.  The shared decision making visit was completed on 01-15-16.  Patient is agreeable for the CT scan to be scheduled.  Will call patient back with date and time of appointment.

## 2018-02-17 ENCOUNTER — Telehealth: Payer: Self-pay | Admitting: *Deleted

## 2018-02-17 NOTE — Telephone Encounter (Signed)
Called pt to inform her of her appt for ldct screening on Thursday 02/18/2018 here @ [facility] for 1:15pm voiced understanding.

## 2018-02-18 ENCOUNTER — Ambulatory Visit
Admission: RE | Admit: 2018-02-18 | Discharge: 2018-02-18 | Disposition: A | Discharge status: Home / Self Care | Payer: Medicaid Other | Source: Ambulatory Visit | Attending: Nurse Practitioner | Admitting: Nurse Practitioner

## 2018-02-18 DIAGNOSIS — Z122 Encounter for screening for malignant neoplasm of respiratory organs: Secondary | ICD-10-CM | POA: Insufficient documentation

## 2018-02-19 ENCOUNTER — Encounter: Payer: Self-pay | Admitting: *Deleted

## 2018-02-21 ENCOUNTER — Encounter: Payer: Self-pay | Admitting: Family Medicine

## 2018-02-21 DIAGNOSIS — I714 Abdominal aortic aneurysm, without rupture, unspecified: Secondary | ICD-10-CM

## 2018-02-22 NOTE — Telephone Encounter (Signed)
Patient presented in clinic, discussed in person the findings shown on imaging addendum of AAA. Will order abdominal u/s as recommended. Pt understands, questions were answered  Copied from [place] [phone]. Topic: General - Other >> Feb 22, 2018 11:44 AM Lennox Solders wrote: Reason for CRM: pt is calling and has sent   rachel lane message on mychart. Pt would like rachel to look at chest ct scan results and to call her.

## 2018-02-26 ENCOUNTER — Other Ambulatory Visit: Payer: Self-pay | Admitting: Family Medicine

## 2018-02-26 DIAGNOSIS — I714 Abdominal aortic aneurysm, without rupture, unspecified: Secondary | ICD-10-CM

## 2018-03-03 ENCOUNTER — Other Ambulatory Visit: Payer: Self-pay | Admitting: Family Medicine

## 2018-03-11 ENCOUNTER — Ambulatory Visit: Payer: Medicaid Other

## 2018-03-17 ENCOUNTER — Ambulatory Visit
Admission: RE | Admit: 2018-03-17 | Discharge: 2018-03-17 | Disposition: A | Discharge status: Home / Self Care | Payer: Medicaid Other | Source: Ambulatory Visit | Attending: Family Medicine | Admitting: Family Medicine

## 2018-03-17 DIAGNOSIS — I714 Abdominal aortic aneurysm, without rupture, unspecified: Secondary | ICD-10-CM

## 2018-03-31 ENCOUNTER — Other Ambulatory Visit: Payer: Self-pay | Admitting: Family Medicine

## 2018-03-31 NOTE — Telephone Encounter (Signed)
Requested medication (s) are due for refill today -yes  Requested medication (s) are on the active medication list -yes  Future visit scheduled -yes  Last refill: 12/26/16  Notes to clinic: Patient is requesting medication that does not pass lab protocol. Sent for provider review  Requested Prescriptions  Pending Prescriptions Disp Refills   atorvastatin (LIPITOR) 40 MG tablet [Pharmacy Med Name: ATORVASTATIN 40MG  TABLETS] 90 tablet 3    Sig: TAKE 1 TABLET BY MOUTH EVERY DAY     Cardiovascular:  Antilipid - Statins Failed - 03/31/2018 11:18 AM      Failed - Total Cholesterol in normal range and within 360 days    Cholesterol, Total  Date Value Ref Range Status  05/28/2015 132 100 - 199 mg/dL Final   Cholesterol Piccolo, Waived  Date Value Ref Range Status  01/05/2015 142 <200 mg/dL Final    Comment:                            Desirable                <200                         Borderline High      200- 239                         High                     >239          Failed - LDL in normal range and within 360 days    LDL Calculated  Date Value Ref Range Status  05/28/2015 70 0 - 99 mg/dL Final         Failed - HDL in normal range and within 360 days    HDL  Date Value Ref Range Status  05/28/2015 34 (L) >39 mg/dL Final         Failed - Triglycerides in normal range and within 360 days    Triglycerides  Date Value Ref Range Status  05/28/2015 138 0 - 149 mg/dL Final   Triglycerides Piccolo,Waived  Date Value Ref Range Status  01/05/2015 138 <150 mg/dL Final    Comment:                            Normal                   <150                         Borderline High     150 - 199                         High                200 - 499                         Very High                >499          Passed - Patient is not pregnant      Passed - Valid encounter within last 12 months  Recent Outpatient Visits          2 months ago Bilateral impacted cerumen    [place], [place], [place]   3 months ago SOB (shortness of breath)   [place], [place], [place]   4 months ago Cellulitis of left upper arm   [place], [place], [place]   5 months ago [place], [place], [place]   10 months ago Nausea   [place], Lilia Argue, [place]      Future Appointments            In 3 months [facility], [place], [place], [facility]            Requested Prescriptions  Pending Prescriptions Disp Refills   atorvastatin (LIPITOR) 40 MG tablet [Pharmacy Med Name: ATORVASTATIN 40MG  TABLETS] 90 tablet 3    Sig: TAKE 1 TABLET BY MOUTH EVERY DAY     Cardiovascular:  Antilipid - Statins Failed - 03/31/2018 11:18 AM      Failed - Total Cholesterol in normal range and within 360 days    Cholesterol, Total  Date Value Ref Range Status  05/28/2015 132 100 - 199 mg/dL Final   Cholesterol Piccolo, Waived  Date Value Ref Range Status  01/05/2015 142 <200 mg/dL Final    Comment:                            Desirable                <200                         Borderline High      200- 239                         High                     >239          Failed - LDL in normal range and within 360 days    LDL Calculated  Date Value Ref Range Status  05/28/2015 70 0 - 99 mg/dL Final         Failed - HDL in normal range and within 360 days    HDL  Date Value Ref Range Status  05/28/2015 34 (L) >39 mg/dL Final         Failed - Triglycerides in normal range and within 360 days    Triglycerides  Date Value Ref Range Status  05/28/2015 138 0 - 149 mg/dL Final   Triglycerides Piccolo,Waived  Date Value Ref Range Status  01/05/2015 138 <150 mg/dL Final    Comment:                            Normal                   <150                         Borderline High     150 - 199                         High  200 - 36                         Very High                >499          Passed - Patient is not pregnant      Passed - Valid encounter within last 12 months    Recent Outpatient Visits          2 months ago Bilateral impacted cerumen   [place], [place], [place]   3 months ago SOB (shortness of breath)   [place], [place]   4 months ago Cellulitis of left upper arm   [place], [place], [place]   5 months ago [place], [place]   10 months ago Nausea   [place], Lilia Argue, [place]      Future Appointments            In 3 months [facility] Des[facility], Lilia Argue, [place], [place]

## 2018-04-19 ENCOUNTER — Telehealth: Payer: Self-pay | Admitting: Family Medicine

## 2018-04-19 NOTE — Telephone Encounter (Signed)
Message relayed to patient. Verbalized understanding and denied questions.   

## 2018-04-19 NOTE — Telephone Encounter (Signed)
Reviewed note, recommendation was to see Cardiology for further eval if considered necessary. Pt already established with Dr. Nehemiah Massed and is very overdue in seeing him. I recommend she schedule a f/u with him to discuss if further imaging warranted.   Copied from [place] [phone]. Topic: Appointment Scheduling - Scheduling Inquiry for Clinic >> Brandi Steele, Brandi Steele 11:55 AM Brandi Steele wrote: Reason for CRM:   Pt was seen at eye doctor and that doctor has written up notes for [facility].  Pt would like Brandi Steele to review these notes and tell her if she needs an appointment or not.  Pt will stop by to drop paperwork off only.

## 2018-05-06 ENCOUNTER — Other Ambulatory Visit: Payer: Self-pay | Admitting: Family Medicine

## 2018-05-06 NOTE — Telephone Encounter (Signed)
Requested medication (s) are due for refill today: yes  Requested medication (s) are on the active medication list: yes  Last refill:  D/25/29  Future visit scheduled: yes  Notes to clinic:  Not delegated    Requested Prescriptions  Pending Prescriptions Disp Refills   ALPRAZolam (XANAX) 0.5 MG tablet [Pharmacy Med Name: ALPRAZOLAM 0.5MG  TABLETS] 60 tablet     Sig: TAKE 1 TABLET BY MOUTH TWICE DAILY AS NEEDED FOR ANXIETY     Not Delegated - Psychiatry:  Anxiolytics/Hypnotics Failed - 05/06/2018 12:45 PM      Failed - This refill cannot be delegated      Failed - Urine Drug Screen completed in last 360 days.      Passed - Valid encounter within last 6 months    Recent Outpatient Visits          3 months ago Bilateral impacted cerumen   [place], [place]   4 months ago SOB (shortness of breath)   [place], [place]   5 months ago Cellulitis of left upper arm   [place], [place], [place]   6 months ago [place], [place]   12 months ago Nausea   [place], Lilia Argue, [place]      Future Appointments            In 1 month [facility] Desanctis, Lilia Argue, [place], [place]

## 2018-06-28 ENCOUNTER — Other Ambulatory Visit: Payer: Self-pay | Admitting: Nurse Practitioner

## 2018-06-30 ENCOUNTER — Ambulatory Visit: Payer: Medicaid Other | Admitting: Family Medicine

## 2018-07-02 ENCOUNTER — Other Ambulatory Visit: Payer: Self-pay | Admitting: Family Medicine

## 2018-08-19 ENCOUNTER — Other Ambulatory Visit: Payer: Self-pay | Admitting: Family Medicine

## 2018-09-01 ENCOUNTER — Encounter: Payer: Self-pay | Admitting: Family Medicine

## 2018-09-01 ENCOUNTER — Ambulatory Visit (INDEPENDENT_AMBULATORY_CARE_PROVIDER_SITE_OTHER): Payer: Medicare Other | Admitting: Family Medicine

## 2018-09-01 ENCOUNTER — Other Ambulatory Visit: Payer: Self-pay

## 2018-09-01 VITALS — BP 110/64 | HR 62 | Temp 98.0°F | Ht 65.0 in | Wt 206.0 lb

## 2018-09-01 DIAGNOSIS — F419 Anxiety disorder, unspecified: Secondary | ICD-10-CM

## 2018-09-01 DIAGNOSIS — I509 Heart failure, unspecified: Secondary | ICD-10-CM | POA: Diagnosis not present

## 2018-09-01 DIAGNOSIS — M797 Fibromyalgia: Secondary | ICD-10-CM

## 2018-09-01 DIAGNOSIS — I2510 Atherosclerotic heart disease of native coronary artery without angina pectoris: Secondary | ICD-10-CM

## 2018-09-01 DIAGNOSIS — I700 Atherosclerosis of aorta: Secondary | ICD-10-CM | POA: Diagnosis not present

## 2018-09-01 DIAGNOSIS — J449 Chronic obstructive pulmonary disease, unspecified: Secondary | ICD-10-CM | POA: Diagnosis not present

## 2018-09-01 DIAGNOSIS — E782 Mixed hyperlipidemia: Secondary | ICD-10-CM

## 2018-09-01 DIAGNOSIS — I110 Hypertensive heart disease with heart failure: Secondary | ICD-10-CM

## 2018-09-01 DIAGNOSIS — G4733 Obstructive sleep apnea (adult) (pediatric): Secondary | ICD-10-CM

## 2018-09-01 DIAGNOSIS — F322 Major depressive disorder, single episode, severe without psychotic features: Secondary | ICD-10-CM

## 2018-09-01 DIAGNOSIS — K219 Gastro-esophageal reflux disease without esophagitis: Secondary | ICD-10-CM

## 2018-09-01 MED ORDER — BUSPIRONE HCL 10 MG PO TABS: 10.0000 mg | ORAL_TABLET | Freq: Two times a day (BID) | ORAL | 0 refills | Status: DC

## 2018-09-01 NOTE — Progress Notes (Signed)
BP 110/64 (BP Location: Right Arm, Patient Position: Sitting, Cuff Size: Normal)    Pulse 62    Temp 98 F (36.7 C) (Oral)    Ht 5\' 5"  (1.651 m)    Wt 206 lb (93.4 kg)    LMP  (LMP Unknown)    SpO2 96%    BMI 34.28 kg/m    Subjective:    Patient ID: Brandi Steele, female    DOB: 07-05-1955, 63 y.o.   MRN: [id number]  HPI: ENVY MENO is a 63 y.o. female  Chief Complaint  Patient presents with   Hypomagnesmia    6 month F/U   Hypokalemia   Fibromyalgia   Here today for 6 month f/u chronic conditions.   Main concern today aside from that is her moods/anxiety. Struggling with moods and irritability, feels the pandemic has bothered her to a great extent. Used to being very active and traveling and since March has barely left the house or done anything due to her risk factors and fear. Crying often, not sleeping well, very snappy with partner. Denies SI/HI. Of note, it appears she stopped her buspar over a year ago and isn't sure why. Was doing very well when she was taking it. Has hydroxyzine for as needed use as well as a small supply of xanax for which she rarely uses.   COPD - breathing stable, has not used her inhalers for quite some time and has had no recent flares.   Does have home oxygen available that she uses at night at times and when she has a panic attack. States she needs a new CPAP machine as hers is several years old. Has been 2 years without smoking and feels such a benefit from this.   Hx of hypocalcemia, hypokalemia, and hypomagnesemia currently on supplementation of all. Starting to get floaters again and states this is typically a sign her levels are off so requesting rechecks of all.   Taking lipitor daily for HLD without side effects. Denies CP, SOB, HAs, dizziness.   HTN/CHF - Stable without sxs on lasix and lisinopril regimen. Home BPs consistently below 120/80. Denies CP, SOB, HAs, dizziness, orthopnea, edema. Has a Cardiologist but notes she hasn't seen  them in over a year.   Depression screen [facility] 2/9 09/01/2018 10/23/2017 12/26/2016  Decreased Interest 0 0 0  Down, Depressed, Hopeless 2 0 0  PHQ - 2 Score 2 0 0  Altered sleeping 0 2 2  Tired, decreased energy 1 2 0  Change in appetite 1 2 0  Feeling bad or failure about yourself  1 0 0  Trouble concentrating 0 0 1  Moving slowly or fidgety/restless 0 0 0  Suicidal thoughts 0 0 0  PHQ-9 Score 5 6 3    GAD 7 : Generalized Anxiety Score 09/01/2018 10/23/2017 12/26/2016  Nervous, Anxious, on Edge 0 2 1  Control/stop worrying 0 2 2  Worry too much - different things 0 2 2  Trouble relaxing 0 2 0  Restless 0 0 0  Easily annoyed or irritable 2 0 0  Afraid - awful might happen 0 2 2  Total GAD 7 Score 2 10 7    Relevant past medical, surgical, family and social history reviewed and updated as indicated. Interim medical history since our last visit reviewed. Allergies and medications reviewed and updated.  Review of Systems  Per HPI unless specifically indicated above     Objective:    BP 110/64 (BP Location: Right Arm,  Patient Position: Sitting, Cuff Size: Normal)    Pulse 62    Temp 98 F (36.7 C) (Oral)    Ht 5\' 5"  (1.651 m)    Wt 206 lb (93.4 kg)    LMP  (LMP Unknown)    SpO2 96%    BMI 34.28 kg/m   Wt Readings from Last 3 Encounters:  09/01/18 206 lb (93.4 kg)  02/18/18 195 lb (88.5 kg)  01/21/18 200 lb (90.7 kg)    Physical Exam Vitals signs and nursing note reviewed.  Constitutional:      Appearance: Normal appearance. She is not ill-appearing.  HENT:     Head: Atraumatic.  Eyes:     Extraocular Movements: Extraocular movements intact.     Conjunctiva/sclera: Conjunctivae normal.  Neck:     Musculoskeletal: Normal range of motion and neck supple.  Cardiovascular:     Rate and Rhythm: Normal rate and regular rhythm.     Heart sounds: Normal heart sounds.  Pulmonary:     Effort: Pulmonary effort is normal.     Breath sounds: Normal breath sounds.  Musculoskeletal:  Normal range of motion.  Skin:    General: Skin is warm and dry.  Neurological:     Mental Status: She is alert and oriented to person, place, and time.  Psychiatric:        Thought Content: Thought content normal.        Judgment: Judgment normal.     Comments: Tearful at times during visit     Results for orders placed or performed in visit on 09/01/18  CBC with Differential/Platelet  Result Value Ref Range   WBC 4.6 3.4 - 10.8 x10E3/uL   RBC 4.62 3.77 - 5.28 x10E6/uL   Hemoglobin 13.3 11.1 - 15.9 g/dL   Hematocrit 40.4 34.0 - 46.6 %   MCV 87 79 - 97 fL   MCH 28.8 26.6 - 33.0 pg   MCHC 32.9 31.5 - 35.7 g/dL   RDW 14.4 11.7 - 15.4 %   Platelets 174 150 - 450 x10E3/uL   Neutrophils 63 Not Estab. %   Lymphs 26 Not Estab. %   Monocytes 8 Not Estab. %   Eos 2 Not Estab. %   Basos 1 Not Estab. %   Neutrophils Absolute 2.9 1.4 - 7.0 x10E3/uL   Lymphocytes Absolute 1.2 0.7 - 3.1 x10E3/uL   Monocytes Absolute 0.4 0.1 - 0.9 x10E3/uL   EOS (ABSOLUTE) 0.1 0.0 - 0.4 x10E3/uL   Basophils Absolute 0.0 0.0 - 0.2 x10E3/uL   Immature Granulocytes 0 Not Estab. %   Immature Grans (Abs) 0.0 0.0 - 0.1 x10E3/uL  Comprehensive metabolic panel  Result Value Ref Range   Glucose 94 65 - 99 mg/dL   BUN 15 8 - 27 mg/dL   Creatinine, Ser 0.83 0.57 - 1.00 mg/dL   GFR calc non Af Amer 74 >59 mL/min/1.73   GFR calc Af Amer 86 >59 mL/min/1.73   BUN/Creatinine Ratio 18 12 - 28   Sodium 142 134 - 144 mmol/L   Potassium 4.8 3.5 - 5.2 mmol/L   Chloride 101 96 - 106 mmol/L   CO2 25 20 - 29 mmol/L   Calcium 9.6 8.7 - 10.3 mg/dL   Total Protein 6.5 6.0 - 8.5 g/dL   Albumin 4.4 3.8 - 4.8 g/dL   Globulin, Total 2.1 1.5 - 4.5 g/dL   Albumin/Globulin Ratio 2.1 1.2 - 2.2   Bilirubin Total 0.2 0.0 - 1.2 mg/dL   Alkaline Phosphatase 68  39 - 117 IU/L   AST 19 0 - 40 IU/L   ALT 13 0 - 32 IU/L  Magnesium  Result Value Ref Range   Magnesium 2.1 1.6 - 2.3 mg/dL  Phosphorus  Result Value Ref Range    Phosphorus 3.4 3.0 - 4.3 mg/dL  Lipid Panel w/o Chol/HDL Ratio  Result Value Ref Range   Cholesterol, Total 144 100 - 199 mg/dL   Triglycerides 113 0 - 149 mg/dL   HDL 36 (L) >39 mg/dL   VLDL Cholesterol Cal 23 5 - 40 mg/dL   LDL Calculated 85 0 - 99 mg/dL      Assessment & Plan:   Problem List Items Addressed This Visit      Cardiovascular and Mediastinum   CHF (congestive heart failure) (HCC)    Stable and under good control, continue current regimen. F/u with Cardiology for check in as it's been over a year      Hypertensive heart disease    Stable and under good control, continue current regimen      Relevant Orders   CBC with Differential/Platelet (Completed)   CAD (coronary artery disease)    Recheck lipids, adjust as needed. Continue current regimen, good lifestyle habits reviewed      Aortic atherosclerosis (HCC)     Respiratory   COPD (chronic obstructive pulmonary disease) ([place]) - Primary    Stable without recent exacerbations, continue current regimen      Sleep apnea    Will place order for new CPAP machine. Continue nigihtly use        Digestive   GERD (gastroesophageal reflux disease)    Stable and under good control, continue current regimen        Other   Fibromyalgia   Hyperlipidemia   Relevant Orders   Comprehensive metabolic panel (Completed)   Lipid Panel w/o Chol/HDL Ratio (Completed)   Depression    Unclear why she stopped the buspar at some point the past year or so. WIll restart and monitor for mood benefit      Relevant Medications   busPIRone (BUSPAR) 10 MG tablet   Hypomagnesemia   Relevant Orders   Magnesium (Completed)   Phosphorus (Completed)   Anxiety    Continue prn hydroxyzine and rare use of xanax for severe episodes of panic.       Relevant Medications   busPIRone (BUSPAR) 10 MG tablet       Follow up plan: Return in about 6 months (around 03/04/2019) for CPE.

## 2018-09-02 LAB — COMPREHENSIVE METABOLIC PANEL
ALT: 13 IU/L (ref 0–32)
AST: 19 IU/L (ref 0–40)
Albumin/Globulin Ratio: 2.1 (ref 1.2–2.2)
Albumin: 4.4 g/dL (ref 3.8–4.8)
Alkaline Phosphatase: 68 IU/L (ref 39–117)
BUN/Creatinine Ratio: 18 (ref 12–28)
BUN: 15 mg/dL (ref 8–27)
Bilirubin Total: 0.2 mg/dL (ref 0.0–1.2)
CO2: 25 mmol/L (ref 20–29)
Calcium: 9.6 mg/dL (ref 8.7–10.3)
Chloride: 101 mmol/L (ref 96–106)
Creatinine, Ser: 0.83 mg/dL (ref 0.57–1.00)
GFR calc Af Amer: 86 mL/min/{1.73_m2} (ref >59)
GFR calc non Af Amer: 74 mL/min/{1.73_m2} (ref >59)
Globulin, Total: 2.1 g/dL (ref 1.5–4.5)
Glucose: 94 mg/dL (ref 65–99)
Potassium: 4.8 mmol/L (ref 3.5–5.2)
Sodium: 142 mmol/L (ref 134–144)
Total Protein: 6.5 g/dL (ref 6.0–8.5)

## 2018-09-02 LAB — LIPID PANEL W/O CHOL/HDL RATIO
Cholesterol, Total: 144 mg/dL (ref 100–199)
HDL: 36 mg/dL — ABNORMAL LOW (ref >39)
LDL Calculated: 85 mg/dL (ref 0–99)
Triglycerides: 113 mg/dL (ref 0–149)
VLDL Cholesterol Cal: 23 mg/dL (ref 5–40)

## 2018-09-02 LAB — CBC WITH DIFFERENTIAL/PLATELET
Basophils Absolute: 0 10*3/uL (ref 0.0–0.2)
Basos: 1 %
EOS (ABSOLUTE): 0.1 10*3/uL (ref 0.0–0.4)
Eos: 2 %
Hematocrit: 40.4 % (ref 34.0–46.6)
Hemoglobin: 13.3 g/dL (ref 11.1–15.9)
Immature Grans (Abs): 0 10*3/uL (ref 0.0–0.1)
Immature Granulocytes: 0 %
Lymphocytes Absolute: 1.2 10*3/uL (ref 0.7–3.1)
Lymphs: 26 %
MCH: 28.8 pg (ref 26.6–33.0)
MCHC: 32.9 g/dL (ref 31.5–35.7)
MCV: 87 fL (ref 79–97)
Monocytes Absolute: 0.4 10*3/uL (ref 0.1–0.9)
Monocytes: 8 %
Neutrophils Absolute: 2.9 10*3/uL (ref 1.4–7.0)
Neutrophils: 63 %
Platelets: 174 10*3/uL (ref 150–450)
RBC: 4.62 x10E6/uL (ref 3.77–5.28)
RDW: 14.4 % (ref 11.7–15.4)
WBC: 4.6 10*3/uL (ref 3.4–10.8)

## 2018-09-02 LAB — MAGNESIUM: Magnesium: 2.1 mg/dL (ref 1.6–2.3)

## 2018-09-02 LAB — PHOSPHORUS: Phosphorus: 3.4 mg/dL (ref 3.0–4.3)

## 2018-09-07 NOTE — Assessment & Plan Note (Signed)
Stable and under good control, continue current regimen 

## 2018-09-07 NOTE — Assessment & Plan Note (Signed)
Stable without recent exacerbations, continue current regimen

## 2018-09-07 NOTE — Assessment & Plan Note (Signed)
Recheck lipids, adjust as needed. Continue current regimen, good lifestyle habits reviewed

## 2018-09-07 NOTE — Assessment & Plan Note (Signed)
Will place order for new CPAP machine. Continue nigihtly use

## 2018-09-07 NOTE — Assessment & Plan Note (Signed)
Stable and under good control, continue current regimen. F/u with Cardiology for check in as it's been over a year

## 2018-09-07 NOTE — Assessment & Plan Note (Signed)
Continue prn hydroxyzine and rare use of xanax for severe episodes of panic.

## 2018-09-07 NOTE — Assessment & Plan Note (Signed)
Unclear why she stopped the buspar at some point the past year or so. WIll restart and monitor for mood benefit

## 2018-10-19 ENCOUNTER — Other Ambulatory Visit: Payer: Self-pay

## 2018-10-19 MED ORDER — HYDROXYZINE HCL 25 MG PO TABS: 25.0000 mg | ORAL_TABLET | Freq: Three times a day (TID) | ORAL | 3 refills | Status: DC | PRN

## 2018-10-19 NOTE — Telephone Encounter (Signed)
Last seen 09/01/18

## 2018-10-27 ENCOUNTER — Other Ambulatory Visit: Payer: Self-pay

## 2018-10-27 NOTE — Telephone Encounter (Signed)
Medication refill for Hydroxizine.   Medication was refilled by Dr. [name], but the class was print. Did not go through electronically.  Merrie Roof PCP. Routing to PCP.  Last Visit 09/01/2018

## 2018-10-29 ENCOUNTER — Other Ambulatory Visit: Payer: Self-pay | Admitting: Family Medicine

## 2018-10-29 MED ORDER — HYDROXYZINE HCL 25 MG PO TABS: 25.0000 mg | ORAL_TABLET | Freq: Three times a day (TID) | ORAL | 3 refills | Status: DC | PRN

## 2018-11-17 ENCOUNTER — Other Ambulatory Visit: Payer: Self-pay | Admitting: Family Medicine

## 2018-11-17 ENCOUNTER — Other Ambulatory Visit: Payer: Self-pay

## 2018-11-17 ENCOUNTER — Ambulatory Visit (INDEPENDENT_AMBULATORY_CARE_PROVIDER_SITE_OTHER): Payer: Medicare Other | Admitting: Family Medicine

## 2018-11-17 ENCOUNTER — Encounter: Payer: Self-pay | Admitting: Family Medicine

## 2018-11-17 VITALS — BP 137/84 | HR 71 | Temp 98.3°F | Wt 205.0 lb

## 2018-11-17 DIAGNOSIS — E6609 Other obesity due to excess calories: Secondary | ICD-10-CM

## 2018-11-17 DIAGNOSIS — Z6834 Body mass index (BMI) 34.0-34.9, adult: Secondary | ICD-10-CM

## 2018-11-17 DIAGNOSIS — Z23 Encounter for immunization: Secondary | ICD-10-CM | POA: Diagnosis not present

## 2018-11-17 DIAGNOSIS — E782 Mixed hyperlipidemia: Secondary | ICD-10-CM

## 2018-11-17 DIAGNOSIS — J449 Chronic obstructive pulmonary disease, unspecified: Secondary | ICD-10-CM

## 2018-11-17 DIAGNOSIS — M797 Fibromyalgia: Secondary | ICD-10-CM

## 2018-11-17 DIAGNOSIS — I714 Abdominal aortic aneurysm, without rupture, unspecified: Secondary | ICD-10-CM

## 2018-11-17 DIAGNOSIS — I2510 Atherosclerotic heart disease of native coronary artery without angina pectoris: Secondary | ICD-10-CM

## 2018-11-17 DIAGNOSIS — F322 Major depressive disorder, single episode, severe without psychotic features: Secondary | ICD-10-CM | POA: Diagnosis not present

## 2018-11-17 DIAGNOSIS — I110 Hypertensive heart disease with heart failure: Secondary | ICD-10-CM

## 2018-11-17 DIAGNOSIS — F419 Anxiety disorder, unspecified: Secondary | ICD-10-CM

## 2018-11-17 MED ORDER — BUSPIRONE HCL 10 MG PO TABS: 10.0000 mg | ORAL_TABLET | Freq: Two times a day (BID) | ORAL | 2 refills | Status: DC

## 2018-11-17 MED ORDER — ALPRAZOLAM 0.5 MG PO TABS: 0.5000 mg | ORAL_TABLET | Freq: Two times a day (BID) | ORAL | 0 refills | Status: DC | PRN

## 2018-11-17 NOTE — Progress Notes (Signed)
BP 137/84   Pulse 71   Temp 98.3 F (36.8 C) (Oral)   Wt 205 lb (93 kg)   LMP  (LMP Unknown)   SpO2 96%   BMI 34.11 kg/m    Subjective:    Patient ID: Brandi Steele, female    DOB: [birth year], 63 y.o.   MRN: [id number]  HPI: Brandi Steele is a 63 y.o. female  Chief Complaint  Patient presents with  . Depression  . Hypertension  . Labs Only    pt requesting to have A1C checked   Here today for 6 month f/u.   Significant increased stressors with some family issues going on. Taking 1 buspar daily typically, and was off xanax but with everyting going on wanting a script to keep on hand. Does have hydroxyzine which she takes on occasion. Denies SI/HI, side effects to medications.   Started a new brand of magnesium supplementation, wanting to get levels checked to make sure it's working adequately. No recent cramping.   HTN, CAD, CHF - home BPs stable and doing well, typically 120-130/70s-80s range. No swelling, orthopnea, CP, SOB, claudication, myalgias Has a Cardiologist but has not seen them since 2018. Had a AAA found incidentally several months ago and wanting to see when re-screening this needs to be performed. On lipitor without issue for chol management. Eats a healthy diet and stays very active.   FHx of DM - wanting A1C checked due to strong fhx of DM. No sxs, and eating healthy diet.   COPD - on symbicort and prn albuterol regimen, feels this controls things well. No recent exacerbations.   Depression screen [facility] 2/9 11/17/2018 09/01/2018 10/23/2017  Decreased Interest 0 0 0  Down, Depressed, Hopeless 1 2 0  PHQ - 2 Score 1 2 0  Altered sleeping 1 0 2  Tired, decreased energy 1 1 2   Change in appetite 0 1 2  Feeling bad or failure about yourself  0 1 0  Trouble concentrating 1 0 0  Moving slowly or fidgety/restless 0 0 0  Suicidal thoughts 0 0 0  PHQ-9 Score 4 5 6    GAD 7 : Generalized Anxiety Score 09/01/2018 10/23/2017 12/26/2016  Nervous, Anxious, on Edge 0 2  1  Control/stop worrying 0 2 2  Worry too much - different things 0 2 2  Trouble relaxing 0 2 0  Restless 0 0 0  Easily annoyed or irritable 2 0 0  Afraid - awful might happen 0 2 2  Total GAD 7 Score 2 10 7    Relevant past medical, surgical, family and social history reviewed and updated as indicated. Interim medical history since our last visit reviewed. Allergies and medications reviewed and updated.  Review of Systems  Per HPI unless specifically indicated above     Objective:    BP 137/84   Pulse 71   Temp 98.3 F (36.8 C) (Oral)   Wt 205 lb (93 kg)   LMP  (LMP Unknown)   SpO2 96%   BMI 34.11 kg/m   Wt Readings from Last 3 Encounters:  11/17/18 205 lb (93 kg)  09/01/18 206 lb (93.4 kg)  02/18/18 195 lb (88.5 kg)    Physical Exam Vitals signs and nursing note reviewed.  Constitutional:      Appearance: Normal appearance. She is not ill-appearing.  HENT:     Head: Atraumatic.  Eyes:     Extraocular Movements: Extraocular movements intact.     Conjunctiva/sclera: Conjunctivae normal.  Neck:     Musculoskeletal: Normal range of motion and neck supple.  Cardiovascular:     Rate and Rhythm: Normal rate and regular rhythm.     Heart sounds: Normal heart sounds.  Pulmonary:     Effort: Pulmonary effort is normal.     Breath sounds: Normal breath sounds.  Musculoskeletal: Normal range of motion.  Skin:    General: Skin is warm and dry.  Neurological:     Mental Status: She is alert and oriented to person, place, and time.  Psychiatric:        Mood and Affect: Mood normal.        Thought Content: Thought content normal.        Judgment: Judgment normal.     Results for orders placed or performed in visit on 11/17/18  HgB A1c  Result Value Ref Range   Hgb A1c MFr Bld 5.6 4.8 - 5.6 %   Est. average glucose Bld gHb Est-mCnc 114 mg/dL  Comprehensive metabolic panel  Result Value Ref Range   Glucose 94 65 - 99 mg/dL   BUN 12 8 - 27 mg/dL   Creatinine, Ser  0.81 0.57 - 1.00 mg/dL   GFR calc non Af Amer 76 >59 mL/min/1.73   GFR calc Af Amer 88 >59 mL/min/1.73   BUN/Creatinine Ratio 15 12 - 28   Sodium 143 134 - 144 mmol/L   Potassium 4.6 3.5 - 5.2 mmol/L   Chloride 104 96 - 106 mmol/L   CO2 25 20 - 29 mmol/L   Calcium 9.8 8.7 - 10.3 mg/dL   Total Protein 6.9 6.0 - 8.5 g/dL   Albumin 4.7 3.8 - 4.8 g/dL   Globulin, Total 2.2 1.5 - 4.5 g/dL   Albumin/Globulin Ratio 2.1 1.2 - 2.2   Bilirubin Total 0.3 0.0 - 1.2 mg/dL   Alkaline Phosphatase 77 39 - 117 IU/L   AST 20 0 - 40 IU/L   ALT 16 0 - 32 IU/L  Lipid Panel w/o Chol/HDL Ratio  Result Value Ref Range   Cholesterol, Total 144 100 - 199 mg/dL   Triglycerides 135 0 - 149 mg/dL   HDL 32 (L) >39 mg/dL   VLDL Cholesterol Cal 24 5 - 40 mg/dL   LDL Chol Calc (NIH) 88 0 - 99 mg/dL  Magnesium  Result Value Ref Range   Magnesium 1.8 1.6 - 2.3 mg/dL      Assessment & Plan:   Problem List Items Addressed This Visit      Cardiovascular and Mediastinum   Hypertensive heart disease    BPs stable and WNL, continue current regimen. Encouraged f/u with Cardiology as it's been 2 years      CAD (coronary artery disease)    Stable, recheck lipids. No recent anginal episodes. Continue current regimen and f/u with Cardiology      Abdominal aortic aneurysm (AAA) without rupture ([place])    Will refer to Vascular Specialist for routine monitoring and recommendations      Relevant Orders   Ambulatory referral to Vascular Surgery     Respiratory   COPD (chronic obstructive pulmonary disease) ([place])    Stable and under good control on inhaler regimen, continue regimen        Other   Obesity    Diet and exercise reviewed      Relevant Orders   HgB A1c (Completed)   Fibromyalgia    Has been able to come off gabapentin now, takes flexeril prn and tries to  stay active      Hyperlipidemia   Relevant Orders   Comprehensive metabolic panel (Completed)   Lipid Panel w/o Chol/HDL Ratio  (Completed)   Depression    May increase buspar to 2-3 times daily if needed      Relevant Medications   busPIRone (BUSPAR) 10 MG tablet   ALPRAZolam (XANAX) 0.5 MG tablet   Hypomagnesemia    Recheck levels due to change in brand of supplement. Asymptomatic. Continue current regimen      Relevant Orders   Magnesium (Completed)   Anxiety - Primary    May increase buspar to 2-3 times daily if needed, and continue prn hydroxyzine and rare use for severe instances of xanax. 1 script should last around 6 months.       Relevant Medications   busPIRone (BUSPAR) 10 MG tablet   ALPRAZolam (XANAX) 0.5 MG tablet    Other Visit Diagnoses    Need for influenza vaccination       Relevant Orders   Flu Vaccine QUAD High Dose(Fluad) (Completed)       Follow up plan: Return in about 6 months (around 05/18/2019) for CPE.

## 2018-11-18 ENCOUNTER — Telehealth: Payer: Self-pay | Admitting: Family Medicine

## 2018-11-18 LAB — COMPREHENSIVE METABOLIC PANEL
ALT: 16 IU/L (ref 0–32)
AST: 20 IU/L (ref 0–40)
Albumin/Globulin Ratio: 2.1 (ref 1.2–2.2)
Albumin: 4.7 g/dL (ref 3.8–4.8)
Alkaline Phosphatase: 77 IU/L (ref 39–117)
BUN/Creatinine Ratio: 15 (ref 12–28)
BUN: 12 mg/dL (ref 8–27)
Bilirubin Total: 0.3 mg/dL (ref 0.0–1.2)
CO2: 25 mmol/L (ref 20–29)
Calcium: 9.8 mg/dL (ref 8.7–10.3)
Chloride: 104 mmol/L (ref 96–106)
Creatinine, Ser: 0.81 mg/dL (ref 0.57–1.00)
GFR calc Af Amer: 88 mL/min/{1.73_m2} (ref >59)
GFR calc non Af Amer: 76 mL/min/{1.73_m2} (ref >59)
Globulin, Total: 2.2 g/dL (ref 1.5–4.5)
Glucose: 94 mg/dL (ref 65–99)
Potassium: 4.6 mmol/L (ref 3.5–5.2)
Sodium: 143 mmol/L (ref 134–144)
Total Protein: 6.9 g/dL (ref 6.0–8.5)

## 2018-11-18 LAB — LIPID PANEL W/O CHOL/HDL RATIO
Cholesterol, Total: 144 mg/dL (ref 100–199)
HDL: 32 mg/dL — ABNORMAL LOW (ref >39)
LDL Chol Calc (NIH): 88 mg/dL (ref 0–99)
Triglycerides: 135 mg/dL (ref 0–149)
VLDL Cholesterol Cal: 24 mg/dL (ref 5–40)

## 2018-11-18 LAB — HEMOGLOBIN A1C
Est. average glucose Bld gHb Est-mCnc: 114 mg/dL
Hgb A1c MFr Bld: 5.6 % (ref 4.8–5.6)

## 2018-11-18 LAB — MAGNESIUM: Magnesium: 1.8 mg/dL (ref 1.6–2.3)

## 2018-11-18 NOTE — Telephone Encounter (Signed)
Already discussed via mychart message. Very unlikely a bacterial infection, much more likely a site reaction from the vaccine

## 2018-11-18 NOTE — Telephone Encounter (Signed)
Pt always develops cellulitis after getting a flu shot and she has this time again and would like Bactrim called in asap so she can get started on it before it goes to the rest of her arm/ please advise

## 2018-11-19 ENCOUNTER — Encounter: Payer: Self-pay | Admitting: Family Medicine

## 2018-11-19 ENCOUNTER — Ambulatory Visit: Payer: Self-pay

## 2018-11-19 ENCOUNTER — Ambulatory Visit (INDEPENDENT_AMBULATORY_CARE_PROVIDER_SITE_OTHER): Payer: Medicare Other | Admitting: Family Medicine

## 2018-11-19 ENCOUNTER — Other Ambulatory Visit: Payer: Self-pay

## 2018-11-19 VITALS — BP 123/83 | HR 79 | Temp 98.5°F

## 2018-11-19 DIAGNOSIS — T7840XA Allergy, unspecified, initial encounter: Secondary | ICD-10-CM

## 2018-11-19 MED ORDER — PREDNISONE 10 MG PO TABS: ORAL_TABLET | ORAL | 0 refills | Status: DC

## 2018-11-19 MED ORDER — SULFAMETHOXAZOLE-TRIMETHOPRIM 800-160 MG PO TABS: 1.0000 | ORAL_TABLET | Freq: Two times a day (BID) | ORAL | 0 refills | Status: DC

## 2018-11-19 NOTE — Progress Notes (Signed)
BP 123/83   Pulse 79   Temp 98.5 F (36.9 C)   LMP  (LMP Unknown)   SpO2 93%    Subjective:    Patient ID: Brandi Steele, female    DOB: 19-Jan-1956, 63 y.o.   MRN: [id number]  HPI: Brandi Steele is a 63 y.o. female  Chief Complaint  Patient presents with  . Flu Vaccine    Patient has an allergic reaction to the flu shot   Patient here today with arm redness, heat, and pain since getting her flu vaccine 2 days ago. States the area is spreading down her arm and becoming more tender and swollen. This has happened the last few years with the flu shot and only goes away with bactrim. Taking hydroxyzine daily and trying ice on the area as recommended with no relief. Denies fever, chills, body aches, sweats, drainage, abscess.   Relevant past medical, surgical, family and social history reviewed and updated as indicated. Interim medical history since our last visit reviewed. Allergies and medications reviewed and updated.  Review of Systems  Per HPI unless specifically indicated above     Objective:    BP 123/83   Pulse 79   Temp 98.5 F (36.9 C)   LMP  (LMP Unknown)   SpO2 93%   Wt Readings from Last 3 Encounters:  11/17/18 205 lb (93 kg)  09/01/18 206 lb (93.4 kg)  02/18/18 195 lb (88.5 kg)    Physical Exam Vitals signs and nursing note reviewed.  Constitutional:      Appearance: Normal appearance. She is not ill-appearing.  HENT:     Head: Atraumatic.  Eyes:     Extraocular Movements: Extraocular movements intact.     Conjunctiva/sclera: Conjunctivae normal.  Neck:     Musculoskeletal: Normal range of motion and neck supple.  Cardiovascular:     Rate and Rhythm: Normal rate and regular rhythm.     Heart sounds: Normal heart sounds.  Pulmonary:     Effort: Pulmonary effort is normal.     Breath sounds: Normal breath sounds.  Musculoskeletal: Normal range of motion.  Skin:    General: Skin is warm and dry.     Findings: Erythema (Raised erythematous patch  of left deltoid extending down lateral upper arm. Warm and mildly ttp) present.  Neurological:     Mental Status: She is alert and oriented to person, place, and time.  Psychiatric:        Mood and Affect: Mood normal.        Thought Content: Thought content normal.        Judgment: Judgment normal.     Results for orders placed or performed in visit on 11/17/18  HgB A1c  Result Value Ref Range   Hgb A1c MFr Bld 5.6 4.8 - 5.6 %   Est. average glucose Bld gHb Est-mCnc 114 mg/dL  Comprehensive metabolic panel  Result Value Ref Range   Glucose 94 65 - 99 mg/dL   BUN 12 8 - 27 mg/dL   Creatinine, Ser 0.81 0.57 - 1.00 mg/dL   GFR calc non Af Amer 76 >59 mL/min/1.73   GFR calc Af Amer 88 >59 mL/min/1.73   BUN/Creatinine Ratio 15 12 - 28   Sodium 143 134 - 144 mmol/L   Potassium 4.6 3.5 - 5.2 mmol/L   Chloride 104 96 - 106 mmol/L   CO2 25 20 - 29 mmol/L   Calcium 9.8 8.7 - 10.3 mg/dL   Total Protein  6.9 6.0 - 8.5 g/dL   Albumin 4.7 3.8 - 4.8 g/dL   Globulin, Total 2.2 1.5 - 4.5 g/dL   Albumin/Globulin Ratio 2.1 1.2 - 2.2   Bilirubin Total 0.3 0.0 - 1.2 mg/dL   Alkaline Phosphatase 77 39 - 117 IU/L   AST 20 0 - 40 IU/L   ALT 16 0 - 32 IU/L  Lipid Panel w/o Chol/HDL Ratio  Result Value Ref Range   Cholesterol, Total 144 100 - 199 mg/dL   Triglycerides 135 0 - 149 mg/dL   HDL 32 (L) >39 mg/dL   VLDL Cholesterol Cal 24 5 - 40 mg/dL   LDL Chol Calc (NIH) 88 0 - 99 mg/dL  Magnesium  Result Value Ref Range   Magnesium 1.8 1.6 - 2.3 mg/dL      Assessment & Plan:   Problem List Items Addressed This Visit    None    Visit Diagnoses    Allergic reaction, initial encounter    -  Primary   Site reaction to immunization, no evidence of bacterial infection. Prednisone taper sent, cont ice, antihistamines. Bactrim sent if worsening or having fever.      ER precautions reviewed if having throat swelling, difficulty breathing, etc  Follow up plan: Return if symptoms worsen or fail  to improve.

## 2018-11-19 NOTE — Telephone Encounter (Signed)
Patient called stating that she had her flu vaccine Wednesday 21st. She states that she usually will have a reaction with redness to the surrounding area but this time she states that her upper left arm has turned red to her elbow and is wrapping around the arm.  She messaged the office and followed the advice given but it is not improving and is actually getting worse. She has no breathing or swallowing issues. Care advice read to patient.  She verbalized understanding. Call transferred to office for scheduling.  Reason for Disposition . [1] Redness or red streak around the injection site AND [2] begins > 48 hours after shot AND [3] fever  Answer Assessment - Initial Assessment Questions 1. SYMPTOMS: "What is the main symptom?" (e.g., redness, swelling, pain)      Redness, swelling, hot left arm 2. ONSET: "When was the vaccine (shot) given?" "How much later did the that evening begin?" (e.g., hours, days ago)      Injection on 21st 3. SEVERITY: "How bad is it?"      Encompassing most of the upper arm 4. FEVER: "Is there a fever?" If so, ask: "What is it, how was it measured, and when did it start?"      no 5. IMMUNIZATIONS GIVEN: "What shots have you recently received?"     influenza 6. PAST REACTIONS: "Have you reacted to immunizations before?" If so, ask: "What happened?"     yes 7. OTHER SYMPTOMS: "Do you have any other symptoms?"    Streak looking on the back side of arm  Protocols used: IMMUNIZATION REACTIONS-A-AH

## 2018-11-22 DIAGNOSIS — I714 Abdominal aortic aneurysm, without rupture, unspecified: Secondary | ICD-10-CM | POA: Insufficient documentation

## 2018-11-22 NOTE — Assessment & Plan Note (Signed)
May increase buspar to 2-3 times daily if needed

## 2018-11-22 NOTE — Assessment & Plan Note (Signed)
Recheck levels due to change in brand of supplement. Asymptomatic. Continue current regimen

## 2018-11-22 NOTE — Assessment & Plan Note (Signed)
Has been able to come off gabapentin now, takes flexeril prn and tries to stay active

## 2018-11-22 NOTE — Assessment & Plan Note (Signed)
Diet and exercise reviewed 

## 2018-11-22 NOTE — Assessment & Plan Note (Signed)
Stable, recheck lipids. No recent anginal episodes. Continue current regimen and f/u with Cardiology

## 2018-11-22 NOTE — Assessment & Plan Note (Signed)
Will refer to Vascular Specialist for routine monitoring and recommendations

## 2018-11-22 NOTE — Assessment & Plan Note (Signed)
BPs stable and WNL, continue current regimen. Encouraged f/u with Cardiology as it's been 2 years

## 2018-11-22 NOTE — Assessment & Plan Note (Signed)
May increase buspar to 2-3 times daily if needed, and continue prn hydroxyzine and rare use for severe instances of xanax. 1 script should last around 6 months.

## 2018-11-22 NOTE — Assessment & Plan Note (Signed)
Stable and under good control on inhaler regimen, continue regimen

## 2018-12-02 ENCOUNTER — Ambulatory Visit (INDEPENDENT_AMBULATORY_CARE_PROVIDER_SITE_OTHER): Payer: Medicare Other | Admitting: Vascular Surgery

## 2018-12-02 ENCOUNTER — Other Ambulatory Visit: Payer: Self-pay

## 2018-12-02 ENCOUNTER — Other Ambulatory Visit: Payer: Self-pay | Admitting: Family Medicine

## 2018-12-02 ENCOUNTER — Encounter (INDEPENDENT_AMBULATORY_CARE_PROVIDER_SITE_OTHER): Payer: Self-pay | Admitting: Vascular Surgery

## 2018-12-02 VITALS — BP 128/69 | HR 90 | Resp 18 | Ht 66.0 in | Wt 202.0 lb

## 2018-12-02 DIAGNOSIS — I714 Abdominal aortic aneurysm, without rupture, unspecified: Secondary | ICD-10-CM

## 2018-12-02 DIAGNOSIS — I2510 Atherosclerotic heart disease of native coronary artery without angina pectoris: Secondary | ICD-10-CM

## 2018-12-02 DIAGNOSIS — I10 Essential (primary) hypertension: Secondary | ICD-10-CM | POA: Diagnosis not present

## 2018-12-02 DIAGNOSIS — J449 Chronic obstructive pulmonary disease, unspecified: Secondary | ICD-10-CM

## 2018-12-02 DIAGNOSIS — E782 Mixed hyperlipidemia: Secondary | ICD-10-CM

## 2018-12-02 DIAGNOSIS — I6523 Occlusion and stenosis of bilateral carotid arteries: Secondary | ICD-10-CM | POA: Diagnosis not present

## 2018-12-02 DIAGNOSIS — I6529 Occlusion and stenosis of unspecified carotid artery: Secondary | ICD-10-CM | POA: Insufficient documentation

## 2018-12-02 NOTE — Progress Notes (Signed)
MRN : [id number]  Brandi Steele is a 63 y.o. (Nov 06, 1955) female who presents with chief complaint of  Chief Complaint  Patient presents with  . New Patient (Initial Visit)    consult AAA  .  History of Present Illness:   The patient presents to the office for evaluation of an abdominal aortic aneurysm. The aneurysm was found incidentally by duplex ultrasound scan on 03/19/2018. Patient denies abdominal pain or unusual back pain, no other abdominal complaints.  No history of an acute onset of painful blue discoloration of the toes.     No family history of AAA.   Patient denies amaurosis fugax or TIA symptoms. There is no history of claudication or rest pain symptoms of the lower extremities.  The patient denies angina or shortness of breath.  CT scan shows an AAA that measures 5.00 cm  Current Meds  Medication Sig  . albuterol (PROVENTIL HFA;VENTOLIN HFA) 108 (90 Base) MCG/ACT inhaler Inhale 2 puffs into the lungs every 6 (six) hours as needed for wheezing or shortness of breath.  . ALPRAZolam (XANAX) 0.5 MG tablet Take 1 tablet (0.5 mg total) by mouth 2 (two) times daily as needed. for anxiety  . atorvastatin (LIPITOR) 40 MG tablet TAKE 1 TABLET BY MOUTH EVERY DAY  . budesonide-formoterol (SYMBICORT) 160-4.5 MCG/ACT inhaler Inhale 2 puffs into the lungs 2 (two) times daily.  . busPIRone (BUSPAR) 10 MG tablet Take 1 tablet (10 mg total) by mouth 2 (two) times daily.  . Calcium Carbonate-Vit D-Min (CALCIUM 1200 PO) Take 2 capsules by mouth daily.  . cetirizine (ZYRTEC) 10 MG tablet Take 1 tablet (10 mg total) by mouth daily.  . clopidogrel (PLAVIX) 75 MG tablet TAKE 1 TABLET BY MOUTH DAILY  . cyclobenzaprine (FLEXERIL) 10 MG tablet Take 1 tablet (10 mg total) by mouth 3 (three) times daily as needed for muscle spasms (for firbromyalgia.).  [name] esomeprazole (NEXIUM) 40 MG capsule TAKE ONE CAPSULE BY MOUTH TWICE DAILY BEFORE A MEAL  . fluticasone (FLONASE) 50 MCG/ACT nasal spray Place  2 sprays into both nostrils 2 (two) times daily.  . furosemide (LASIX) 40 MG tablet Take 1 tablet (40 mg total) by mouth daily as needed.  . hydrOXYzine (ATARAX/VISTARIL) 25 MG tablet Take 1 tablet (25 mg total) by mouth 3 (three) times daily as needed.  [name] lisinopril (ZESTRIL) 2.5 MG tablet TAKE 1 TABLET BY MOUTH EVERY DAY  . Magnesium 400 MG CAPS Take by mouth. Take 6 tablets daily  . meloxicam (MOBIC) 15 MG tablet TAKE 1 TABLET(15 MG) BY MOUTH DAILY  . Potassium Gluconate 595 MG CAPS Take by mouth. Take 4 capsules daily  . predniSONE (DELTASONE) 10 MG tablet Take 6 tabs day one, 5 tabs day two, 4 tabs day three, etc    Past Medical History:  Diagnosis Date  . Anxiety   . Aortic stenosis   . CHF (congestive heart failure) ([place])   . COPD (chronic obstructive pulmonary disease) ([place])   . Depression   . Fibromyalgia   . GERD (gastroesophageal reflux disease)   . Hyperlipidemia   . Hypertension   . Hypertensive heart disease   . Myalgia   . Obesity   . Sciatica   . Seborrheic keratosis   . Stress incontinence     Past Surgical History:  Procedure Laterality Date  . ABDOMINAL HYSTERECTOMY     partial    Social History Social History   Tobacco Use  . Smoking status: Former Smoker  Packs/day: 1.00    Years: 40.00    Pack years: 40.00    Types: Cigarettes    Quit date: 11/01/2016    Years since quitting: 2.0  . Smokeless tobacco: Never Used  . Tobacco comment: Is trying cold-turkey with social support  Substance Use Topics  . Alcohol use: No    Alcohol/week: 0.0 standard drinks  . Drug use: No    Family History Family History  Problem Relation Age of Onset  . Stroke Mother   . Hypertension Mother   . Heart disease Father   . Diabetes Brother   . Stroke Maternal Grandmother   . Stroke Maternal Grandfather   . Heart disease Paternal Grandmother   . Heart disease Paternal Grandfather   . Breast cancer Neg Hx   No family history of bleeding/clotting disorders,  porphyria or autoimmune disease   Allergies  Allergen Reactions  . Morphine And Related Nausea And Vomiting  . Celexa [Citalopram Hydrobromide] Other (See Comments)    Pruritis   . Effexor [Venlafaxine] Other (See Comments)    Panic attack  . Shellfish Allergy Hives and Swelling  . Wellbutrin [Bupropion] Anxiety     REVIEW OF SYSTEMS (Negative unless checked)  Constitutional: [] Weight loss  [] Fever  [] Chills Cardiac: [] Chest pain   [] Chest pressure   [] Palpitations   [] Shortness of breath when laying flat   [] Shortness of breath with exertion. Vascular:  [] Pain in legs with walking   [] Pain in legs at rest  [] History of DVT   [] Phlebitis   [] Swelling in legs   [] Varicose veins   [] Non-healing ulcers Pulmonary:   [] Uses home oxygen   [] Productive cough   [] Hemoptysis   [] Wheeze  [] COPD   [] Asthma Neurologic:  [] Dizziness   [] Seizures   [] History of stroke   [] History of TIA  [] Aphasia   [] Vissual changes   [] Weakness or numbness in arm   [] Weakness or numbness in leg Musculoskeletal:   [] Joint swelling   [] Joint pain   [] Low back pain Hematologic:  [] Easy bruising  [] Easy bleeding   [] Hypercoagulable state   [] Anemic Gastrointestinal:  [] Diarrhea   [] Vomiting  [x] Gastroesophageal reflux/heartburn   [] Difficulty swallowing. Genitourinary:  [] Chronic kidney disease   [] Difficult urination  [] Frequent urination   [] Blood in urine Skin:  [] Rashes   [] Ulcers  Psychological:  [] History of anxiety   []  History of major depression.  Physical Examination  Vitals:   12/02/18 1420  BP: 128/69  Pulse: 90  Resp: 18  Weight: 202 lb (91.6 kg)  Height: 5\' 6"  (1.676 m)   Body mass index is 32.6 kg/m. Gen: WD/WN, NAD Head: /AT, No temporalis wasting.  Ear/Nose/Throat: Hearing grossly intact, nares w/o erythema or drainage, poor dentition Eyes: PER, EOMI, sclera nonicteric.  Neck: Supple, no masses.  No bruit or JVD.  Pulmonary:  Good air movement, clear to auscultation bilaterally, no  use of accessory muscles.  Cardiac: RRR, normal S1, S2, no Murmurs. Vascular: bilateral carotid bruits Vessel Right Left  Radial Palpable Palpable  Brachial Palpable Palpable  Carotid Palpable Palpable  Popliteal Palpable Palpable  PT Palpable Palpable  DP Palpable Palpable  Gastrointestinal: soft, non-distended. No guarding/no peritoneal signs.  Musculoskeletal: M/S 5/5 throughout.  No deformity or atrophy.  Neurologic: CN 2-12 intact. Pain and light touch intact in extremities.  Symmetrical.  Speech is fluent. Motor exam as listed above. Psychiatric: Judgment intact, Mood & affect appropriate for pt's clinical situation. Dermatologic: No rashes or ulcers noted.  No changes consistent with  cellulitis. Lymph : No Cervical lymphadenopathy, no lichenification or skin changes of chronic lymphedema.  CBC Lab Results  Component Value Date   WBC 4.6 09/01/2018   HGB 13.3 09/01/2018   HCT 40.4 09/01/2018   MCV 87 09/01/2018   PLT 174 09/01/2018    BMET    Component Value Date/Time   NA 143 11/17/2018 1105   NA 143 06/16/2013 1946   K 4.6 11/17/2018 1105   K 4.0 06/16/2013 1946   CL 104 11/17/2018 1105   CL 103 06/16/2013 1946   CO2 25 11/17/2018 1105   CO2 31 06/16/2013 1946   GLUCOSE 94 11/17/2018 1105   GLUCOSE 100 (H) 05/22/2015 1209   GLUCOSE 93 06/16/2013 1946   BUN 12 11/17/2018 1105   BUN 13 06/16/2013 1946   CREATININE 0.81 11/17/2018 1105   CREATININE 0.63 06/16/2013 1946   CALCIUM 9.8 11/17/2018 1105   CALCIUM 9.0 06/16/2013 1946   GFRNONAA 76 11/17/2018 1105   GFRNONAA >60 06/16/2013 1946   GFRAA 88 11/17/2018 1105   GFRAA >60 06/16/2013 1946   Estimated Creatinine Clearance: 78.9 mL/min (by C-G formula based on SCr of 0.81 mg/dL).  COAG No results found for: INR, PROTIME  Radiology No results found.   Assessment/Plan 1. Abdominal aortic aneurysm (AAA) without rupture ([place]) No surgery or intervention at this time. The patient has an asymptomatic  abdominal aortic aneurysm that is greater than 4 cm but less than 5 cm in maximal diameter.  I have discussed the natural history of abdominal aortic aneurysm and the small risk of rupture for aneurysm less than 5 cm in size.  However, as these small aneurysms tend to enlarge over time, continued surveillance with ultrasound or CT scan is mandatory.  I have also discussed optimizing medical management with hypertension and lipid control and the importance of abstinence from tobacco.  The patient is also encouraged to exercise a minimum of 30 minutes 4 times a week.  Should the patient develop new onset abdominal or back pain or signs of peripheral embolization they are instructed to seek medical attention immediately and to alert the physician providing care that they have an aneurysm.  The patient voices their understanding. I have scheduled the patient to return in 6 months with an aortic duplex.  - IVC/ILIACS DUPLEX; Future - IVC/ILIACS DUPLEX; Future  2. Bilateral carotid artery stenosis Recommend:  Given the patient's asymptomatic subcritical stenosis no further invasive testing or surgery at this time.  Duplex ultrasound is due.  Continue antiplatelet therapy as prescribed Continue management of CAD, HTN and Hyperlipidemia Healthy heart diet,  encouraged exercise at least 4 times per week  - Carotid; Future - Carotid; Future  3. Coronary artery disease involving native heart without angina pectoris, unspecified vessel or lesion type Continue cardiac and antihypertensive medications as already ordered and reviewed, no changes at this time.  Continue statin as ordered and reviewed, no changes at this time  Nitrates PRN for chest pain   4. Essential hypertension Continue antihypertensive medications as already ordered, these medications have been reviewed and there are no changes at this time.   5. Chronic obstructive pulmonary disease, unspecified COPD type ([place]) Continue  pulmonary medications and aerosols as already ordered, these medications have been reviewed and there are no changes at this time.   6. Mixed hyperlipidemia Continue statin as ordered and reviewed, no changes at this time    Hortencia Pilar, MD  12/02/2018 2:38 PM

## 2018-12-30 ENCOUNTER — Ambulatory Visit (INDEPENDENT_AMBULATORY_CARE_PROVIDER_SITE_OTHER): Payer: Medicare Other

## 2018-12-30 ENCOUNTER — Other Ambulatory Visit: Payer: Self-pay

## 2018-12-30 ENCOUNTER — Encounter (INDEPENDENT_AMBULATORY_CARE_PROVIDER_SITE_OTHER): Payer: Self-pay | Admitting: Vascular Surgery

## 2018-12-30 ENCOUNTER — Ambulatory Visit (INDEPENDENT_AMBULATORY_CARE_PROVIDER_SITE_OTHER): Payer: Medicare Other | Admitting: Vascular Surgery

## 2018-12-30 VITALS — BP 119/72 | HR 69 | Resp 16 | Wt 201.8 lb

## 2018-12-30 DIAGNOSIS — I714 Abdominal aortic aneurysm, without rupture, unspecified: Secondary | ICD-10-CM

## 2018-12-30 DIAGNOSIS — I700 Atherosclerosis of aorta: Secondary | ICD-10-CM | POA: Diagnosis not present

## 2018-12-30 DIAGNOSIS — I10 Essential (primary) hypertension: Secondary | ICD-10-CM

## 2018-12-30 DIAGNOSIS — E782 Mixed hyperlipidemia: Secondary | ICD-10-CM

## 2018-12-30 DIAGNOSIS — I6523 Occlusion and stenosis of bilateral carotid arteries: Secondary | ICD-10-CM

## 2018-12-30 DIAGNOSIS — I2510 Atherosclerotic heart disease of native coronary artery without angina pectoris: Secondary | ICD-10-CM | POA: Diagnosis not present

## 2018-12-30 NOTE — Progress Notes (Signed)
MRN : [id number]  DEAUNNA Steele is a 63 y.o. ([birth year]) female who presents with chief complaint of No chief complaint on file. [name]  History of Present Illness:  The patient returns to the office for surveillance of a known abdominal aortic aneurysm. Patient denies abdominal pain or back pain, no other abdominal complaints. No changes suggesting embolic episodes.   There have been no interval changes in the patient's overall health care since his last visit.  Patient denies amaurosis fugax or TIA symptoms.   There is no history of claudication or rest pain symptoms of the lower extremities. The patient denies angina or shortness of breath.   Duplex US of the aorta and iliac arteries shows an AAA measured about 3.0 cm cm.  Carotid duplex shows <40% bilateral carotid stenosis.  No outpatient medications have been marked as taking for the 12/30/18 encounter (Appointment) with Delana Steele, Dolores Lory, MD.    Past Medical History:  Diagnosis Date  . Anxiety   . Aortic stenosis   . CHF (congestive heart failure) ([place])   . COPD (chronic obstructive pulmonary disease) ([place])   . Depression   . Fibromyalgia   . GERD (gastroesophageal reflux disease)   . Hyperlipidemia   . Hypertension   . Hypertensive heart disease   . Myalgia   . Obesity   . Sciatica   . Seborrheic keratosis   . Stress incontinence     Past Surgical History:  Procedure Laterality Date  . ABDOMINAL HYSTERECTOMY     partial    Social History Social History   Tobacco Use  . Smoking status: Former Smoker    Packs/day: 1.00    Years: 40.00    Pack years: 40.00    Types: Cigarettes    Quit date: 11/01/2016    Years since quitting: 2.1  . Smokeless tobacco: Never Used  . Tobacco comment: Is trying cold-turkey with social support  Substance Use Topics  . Alcohol use: No    Alcohol/week: 0.0 standard drinks  . Drug use: No    Family History Family History  Problem Relation Age of Onset  . Stroke  Mother   . Hypertension Mother   . Heart disease Father   . Diabetes Brother   . Stroke Maternal Grandmother   . Stroke Maternal Grandfather   . Heart disease Paternal Grandmother   . Heart disease Paternal Grandfather   . Breast cancer Neg Hx     Allergies  Allergen Reactions  . Morphine And Related Nausea And Vomiting  . Celexa [Citalopram Hydrobromide] Other (See Comments)    Pruritis   . Effexor [Venlafaxine] Other (See Comments)    Panic attack  . Shellfish Allergy Hives and Swelling  . Wellbutrin [Bupropion] Anxiety     REVIEW OF SYSTEMS (Negative unless checked)  Constitutional: [] Weight loss  [] Fever  [] Chills Cardiac: [] Chest pain   [] Chest pressure   [] Palpitations   [] Shortness of breath when laying flat   [] Shortness of breath with exertion. Vascular:  [x] Pain in legs with walking   [] Pain in legs at rest  [] History of DVT   [] Phlebitis   [x] Swelling in legs   [] Varicose veins   [] Non-healing ulcers Pulmonary:   [] Uses home oxygen   [] Productive cough   [] Hemoptysis   [] Wheeze  [] COPD   [] Asthma Neurologic:  [] Dizziness   [] Seizures   [] History of stroke   [] History of TIA  [] Aphasia   [] Vissual changes   [] Weakness or numbness in arm   []   Weakness or numbness in leg Musculoskeletal:   [] Joint swelling   [] Joint pain   [] Low back pain Hematologic:  [] Easy bruising  [] Easy bleeding   [] Hypercoagulable state   [] Anemic Gastrointestinal:  [] Diarrhea   [] Vomiting  [] Gastroesophageal reflux/heartburn   [] Difficulty swallowing. Genitourinary:  [] Chronic kidney disease   [] Difficult urination  [] Frequent urination   [] Blood in urine Skin:  [] Rashes   [] Ulcers  Psychological:  [] History of anxiety   []  History of major depression.  Physical Examination  There were no vitals filed for this visit. There is no height or weight on file to calculate BMI. Gen: WD/WN, NAD Head: [place]/AT, No temporalis wasting.  Ear/Nose/Throat: Hearing grossly intact, nares w/o erythema or  drainage Eyes: PER, EOMI, sclera nonicteric.  Neck: Supple, no large masses.   Pulmonary:  Good air movement, no audible wheezing bilaterally, no use of accessory muscles.  Cardiac: RRR, no JVD Vascular:  Vessel Right Left  Radial Palpable Palpable  Carotid Palpable Palpable  Gastrointestinal: Non-distended. No guarding/no peritoneal signs.  Musculoskeletal: M/S 5/5 throughout.  No deformity or atrophy.  Neurologic: CN 2-12 intact. Symmetrical.  Speech is fluent. Motor exam as listed above. Psychiatric: Judgment intact, Mood & affect appropriate for pt's clinical situation. Dermatologic: No rashes or ulcers noted.  No changes consistent with cellulitis. Lymph : No lichenification or skin changes of chronic lymphedema.  CBC Lab Results  Component Value Date   WBC 4.6 09/01/2018   HGB 13.3 09/01/2018   HCT 40.4 09/01/2018   MCV 87 09/01/2018   PLT 174 09/01/2018    BMET    Component Value Date/Time   NA 143 11/17/2018 1105   NA 143 06/16/2013 1946   K 4.6 11/17/2018 1105   K 4.0 06/16/2013 1946   CL 104 11/17/2018 1105   CL 103 06/16/2013 1946   CO2 25 11/17/2018 1105   CO2 31 06/16/2013 1946   GLUCOSE 94 11/17/2018 1105   GLUCOSE 100 (H) 05/22/2015 1209   GLUCOSE 93 06/16/2013 1946   BUN 12 11/17/2018 1105   BUN 13 06/16/2013 1946   CREATININE 0.81 11/17/2018 1105   CREATININE 0.63 06/16/2013 1946   CALCIUM 9.8 11/17/2018 1105   CALCIUM 9.0 06/16/2013 1946   GFRNONAA 76 11/17/2018 1105   GFRNONAA >60 06/16/2013 1946   GFRAA 88 11/17/2018 1105   GFRAA >60 06/16/2013 1946   CrCl cannot be calculated (Patient's most recent lab result is older than the maximum 21 days allowed.).  COAG No results found for: INR, PROTIME  Radiology No results found.   Assessment/Plan 1. Aortic atherosclerosis (H[facility])  Recommend:  The patient has evidence of atherosclerosis of the lower extremities with claudication.  The patient does not voice lifestyle limiting changes at this  point in time.  Noninvasive studies do not suggest clinically significant change.  No invasive studies, angiography or surgery at this time The patient should continue walking and begin a more formal exercise program.  The patient should continue antiplatelet therapy and aggressive treatment of the lipid abnormalities  No changes in the patient's medications at this time  The patient should continue wearing graduated compression socks 10-15 mmHg strength to control the mild edema.    2. Abdominal aortic aneurysm (AAA) without rupture ([place]) No surgery or intervention at this time. The patient has an asymptomatic abdominal aortic aneurysm that is less than 4 cm in maximal diameter.  I have discussed the natural history of abdominal aortic aneurysm and the small risk of rupture for aneurysm less  than 5 cm in size.  However, as these small aneurysms tend to enlarge over time, continued surveillance with ultrasound or CT scan is mandatory.  I have also discussed optimizing medical management with hypertension and lipid control and the importance of abstinence from tobacco.  The patient is also encouraged to exercise a minimum of 30 minutes 4 times a week.  Should the patient develop new onset abdominal or back pain or signs of peripheral embolization they are instructed to seek medical attention immediately and to alert the physician providing care that they have an aneurysm.  The patient voices their understanding. The patient will return in 12 months with an aortic duplex.  - IVC/ILIACS DUPLEX; Future  3. Bilateral carotid artery stenosis Recommend:  Given the patient's asymptomatic subcritical stenosis no further invasive testing or surgery at this time.  Duplex ultrasound shows <40% stenosis bilaterally.  Continue antiplatelet therapy as prescribed Continue management of CAD, HTN and Hyperlipidemia Healthy heart diet,  encouraged exercise at least 4 times per week Follow up in 24  months with duplex ultrasound and physical exam   4. Coronary artery disease involving native heart without angina pectoris, unspecified vessel or lesion type Continue cardiac and antihypertensive medications as already ordered and reviewed, no changes at this time.  Continue statin as ordered and reviewed, no changes at this time  Nitrates PRN for chest pain   5. Essential hypertension Continue antihypertensive medications as already ordered, these medications have been reviewed and there are no changes at this time.   6. Mixed hyperlipidemia Continue statin as ordered and reviewed, no changes at this time     Hortencia Pilar, MD  12/30/2018 9:02 AM

## 2019-01-10 ENCOUNTER — Other Ambulatory Visit: Payer: Self-pay

## 2019-01-10 MED ORDER — LISINOPRIL 2.5 MG PO TABS: 2.5000 mg | ORAL_TABLET | Freq: Every day | ORAL | 1 refills | Status: DC

## 2019-01-10 NOTE — Telephone Encounter (Signed)
Refill request for Lisinopril 2.5mg . LOV: 11/19/2018

## 2019-01-26 ENCOUNTER — Other Ambulatory Visit: Payer: Self-pay | Admitting: Family Medicine

## 2019-01-26 MED ORDER — CYCLOBENZAPRINE HCL 10 MG PO TABS: 10.0000 mg | ORAL_TABLET | Freq: Three times a day (TID) | ORAL | 0 refills | Status: DC | PRN

## 2019-01-26 NOTE — Telephone Encounter (Signed)
Medication Refill - Medication:  cyclobenzaprine (FLEXERIL) 10 MG tablet  Has the patient contacted their pharmacy?  Yes advised to call office. PT thought new script was going to call in at last office visit. Pt requesting a refill be sent in tonight as she is having extreme pain.  Preferred Pharmacy (with phone number or street name):  [facility] [place], [place] Phone:  [phone]  Fax:  [phone]     Agent: Please be advised that RX refills may take up to 3 business days. We ask that you follow-up with your pharmacy.

## 2019-02-12 ENCOUNTER — Other Ambulatory Visit: Payer: Self-pay | Admitting: Family Medicine

## 2019-02-14 ENCOUNTER — Telehealth: Payer: Self-pay | Admitting: *Deleted

## 2019-02-14 DIAGNOSIS — Z87891 Personal history of nicotine dependence: Secondary | ICD-10-CM

## 2019-02-14 NOTE — Telephone Encounter (Signed)
Patient has been notified that annual lung cancer screening low dose CT scan is due currently or will be in near future. Confirmed that patient is within the age range of 55-77, and asymptomatic, (no signs or symptoms of lung cancer). Patient denies illness that would prevent curative treatment for lung cancer if found. Verified smoking history, (former, quit 11/01/16, 40 pack year). The shared decision making visit was done 01/15/16. Patient is agreeable for CT scan being scheduled.

## 2019-02-24 ENCOUNTER — Ambulatory Visit
Admission: RE | Admit: 2019-02-24 | Discharge: 2019-02-24 | Disposition: A | Discharge status: Home / Self Care | Payer: Medicare Other | Source: Ambulatory Visit | Attending: Oncology | Admitting: Oncology

## 2019-02-24 ENCOUNTER — Other Ambulatory Visit: Payer: Self-pay

## 2019-02-24 DIAGNOSIS — Z87891 Personal history of nicotine dependence: Secondary | ICD-10-CM | POA: Diagnosis present

## 2019-02-28 ENCOUNTER — Encounter: Payer: Self-pay | Admitting: *Deleted

## 2019-03-03 ENCOUNTER — Other Ambulatory Visit: Payer: Self-pay

## 2019-03-03 ENCOUNTER — Encounter: Payer: Self-pay | Admitting: Family Medicine

## 2019-03-03 ENCOUNTER — Ambulatory Visit (INDEPENDENT_AMBULATORY_CARE_PROVIDER_SITE_OTHER): Payer: Medicare Other | Admitting: Family Medicine

## 2019-03-03 ENCOUNTER — Telehealth: Payer: Self-pay

## 2019-03-03 VITALS — BP 137/78 | HR 62 | Temp 98.3°F | Wt 200.5 lb

## 2019-03-03 DIAGNOSIS — M545 Low back pain, unspecified: Secondary | ICD-10-CM

## 2019-03-03 DIAGNOSIS — Z23 Encounter for immunization: Secondary | ICD-10-CM

## 2019-03-03 DIAGNOSIS — J449 Chronic obstructive pulmonary disease, unspecified: Secondary | ICD-10-CM | POA: Diagnosis not present

## 2019-03-03 MED ORDER — ALBUTEROL SULFATE HFA 108 (90 BASE) MCG/ACT IN AERS: 2.0000 | INHALATION_SPRAY | Freq: Four times a day (QID) | RESPIRATORY_TRACT | 2 refills | Status: DC | PRN

## 2019-03-03 MED ORDER — CYCLOBENZAPRINE HCL 10 MG PO TABS: 10.0000 mg | ORAL_TABLET | Freq: Three times a day (TID) | ORAL | 0 refills | Status: DC | PRN

## 2019-03-03 MED ORDER — CETIRIZINE HCL 10 MG PO TABS: 10.0000 mg | ORAL_TABLET | Freq: Every day | ORAL | 11 refills | Status: AC

## 2019-03-03 MED ORDER — FLUTICASONE PROPIONATE 50 MCG/ACT NA SUSP: 2.0000 | Freq: Two times a day (BID) | NASAL | 6 refills | Status: DC

## 2019-03-03 MED ORDER — BUSPIRONE HCL 10 MG PO TABS: 10.0000 mg | ORAL_TABLET | Freq: Two times a day (BID) | ORAL | 2 refills | Status: DC

## 2019-03-03 MED ORDER — ATORVASTATIN CALCIUM 40 MG PO TABS: 40.0000 mg | ORAL_TABLET | Freq: Every day | ORAL | 1 refills | Status: DC

## 2019-03-03 MED ORDER — FUROSEMIDE 40 MG PO TABS: 40.0000 mg | ORAL_TABLET | Freq: Every day | ORAL | 3 refills | Status: DC | PRN

## 2019-03-03 MED ORDER — BUDESONIDE-FORMOTEROL FUMARATE 160-4.5 MCG/ACT IN AERO: 2.0000 | INHALATION_SPRAY | Freq: Two times a day (BID) | RESPIRATORY_TRACT | 6 refills | Status: DC

## 2019-03-03 NOTE — Telephone Encounter (Signed)
Fax from pharmacy. Insurance won't cover albuterol (Ventolin) but will cover Proair. Requesting brand new Rx for Proair.

## 2019-03-03 NOTE — Progress Notes (Signed)
BP 137/78   Pulse 62   Temp 98.3 F (36.8 C) (Oral)   Wt 200 lb 8 oz (90.9 kg)   LMP  (LMP Unknown)   SpO2 95%   BMI 32.86 kg/m    Subjective:    Patient ID: Brandi Steele, female    DOB: Jun 01, 1955, 64 y.o.   MRN: [id number]  HPI: Brandi Steele is a 64 y.o. female  Chief Complaint  Patient presents with  . Hip Pain    Right side.  pt states the pain is moving to the front of her body since Monday evening  . Medication Refill    Flexeril   Burning pain starting at right low back/upper hip. Did some extra walking the day the pain started up. Pulling, burning pain. Today things seem a bit better but still having some twinges in low back. No bowel changes, urinary sxs, fevers, appetite changes. States the pain feels superficial. Not trying anything OTC for pain.   Relevant past medical, surgical, family and social history reviewed and updated as indicated. Interim medical history since our last visit reviewed. Allergies and medications reviewed and updated.  Review of Systems  Per HPI unless specifically indicated above     Objective:    BP 137/78   Pulse 62   Temp 98.3 F (36.8 C) (Oral)   Wt 200 lb 8 oz (90.9 kg)   LMP  (LMP Unknown)   SpO2 95%   BMI 32.86 kg/m   Wt Readings from Last 3 Encounters:  03/03/19 200 lb 8 oz (90.9 kg)  02/24/19 200 lb (90.7 kg)  12/30/18 201 lb 12.8 oz (91.5 kg)    Physical Exam Vitals and nursing note reviewed.  Constitutional:      Appearance: Normal appearance. She is not ill-appearing.  HENT:     Head: Atraumatic.  Eyes:     Extraocular Movements: Extraocular movements intact.     Conjunctiva/sclera: Conjunctivae normal.  Cardiovascular:     Rate and Rhythm: Normal rate and regular rhythm.     Heart sounds: Normal heart sounds.  Pulmonary:     Effort: Pulmonary effort is normal.     Breath sounds: Normal breath sounds.  Abdominal:     General: Bowel sounds are normal. There is no distension.     Palpations:  Abdomen is soft. There is no mass.     Tenderness: There is no abdominal tenderness. There is no right CVA tenderness, left CVA tenderness or guarding.  Musculoskeletal:        General: Tenderness (very minimal ttp right lateral low back) present. Normal range of motion.     Cervical back: Normal range of motion and neck supple.  Skin:    General: Skin is warm and dry.  Neurological:     Mental Status: She is alert and oriented to person, place, and time.     Sensory: No sensory deficit.     Motor: No weakness.     Gait: Gait normal.  Psychiatric:        Mood and Affect: Mood normal.        Thought Content: Thought content normal.        Judgment: Judgment normal.     Results for orders placed or performed in visit on 11/17/18  HgB A1c  Result Value Ref Range   Hgb A1c MFr Bld 5.6 4.8 - 5.6 %   Est. average glucose Bld gHb Est-mCnc 114 mg/dL  Comprehensive metabolic panel  Result Value  Ref Range   Glucose 94 65 - 99 mg/dL   BUN 12 8 - 27 mg/dL   Creatinine, Ser 0.81 0.57 - 1.00 mg/dL   GFR calc non Af Amer 76 >59 mL/min/1.73   GFR calc Af Amer 88 >59 mL/min/1.73   BUN/Creatinine Ratio 15 12 - 28   Sodium 143 134 - 144 mmol/L   Potassium 4.6 3.5 - 5.2 mmol/L   Chloride 104 96 - 106 mmol/L   CO2 25 20 - 29 mmol/L   Calcium 9.8 8.7 - 10.3 mg/dL   Total Protein 6.9 6.0 - 8.5 g/dL   Albumin 4.7 3.8 - 4.8 g/dL   Globulin, Total 2.2 1.5 - 4.5 g/dL   Albumin/Globulin Ratio 2.1 1.2 - 2.2   Bilirubin Total 0.3 0.0 - 1.2 mg/dL   Alkaline Phosphatase 77 39 - 117 IU/L   AST 20 0 - 40 IU/L   ALT 16 0 - 32 IU/L  Lipid Panel w/o Chol/HDL Ratio  Result Value Ref Range   Cholesterol, Total 144 100 - 199 mg/dL   Triglycerides 135 0 - 149 mg/dL   HDL 32 (L) >39 mg/dL   VLDL Cholesterol Cal 24 5 - 40 mg/dL   LDL Chol Calc (NIH) 88 0 - 99 mg/dL  Magnesium  Result Value Ref Range   Magnesium 1.8 1.6 - 2.3 mg/dL      Assessment & Plan:   Problem List Items Addressed This Visit        Respiratory   COPD (chronic obstructive pulmonary disease) (HCC)   Relevant Medications   albuterol (VENTOLIN HFA) 108 (90 Base) MCG/ACT inhaler   budesonide-formoterol (SYMBICORT) 160-4.5 MCG/ACT inhaler   cetirizine (ZYRTEC) 10 MG tablet   fluticasone (FLONASE) 50 MCG/ACT nasal spray    Other Visit Diagnoses    Acute right-sided low back pain without sciatica    -  Primary   Resolving, minimal discomfort today. Suspect lumbar strain   Relevant Medications   cyclobenzaprine (FLEXERIL) 10 MG tablet   Need for pneumococcal vaccine       Relevant Orders   Pneumococcal conjugate vaccine 13-valent IM (Completed)       Follow up plan: Return in about 2 months (around 05/01/2019) for 6 month f/u.

## 2019-03-04 MED ORDER — ALBUTEROL SULFATE HFA 108 (90 BASE) MCG/ACT IN AERS: 2.0000 | INHALATION_SPRAY | Freq: Four times a day (QID) | RESPIRATORY_TRACT | 2 refills | Status: DC | PRN

## 2019-03-04 NOTE — Telephone Encounter (Signed)
Rx modified

## 2019-05-02 ENCOUNTER — Encounter: Payer: Self-pay | Admitting: Family Medicine

## 2019-05-02 ENCOUNTER — Other Ambulatory Visit: Payer: Self-pay

## 2019-05-02 ENCOUNTER — Ambulatory Visit (INDEPENDENT_AMBULATORY_CARE_PROVIDER_SITE_OTHER): Payer: Medicare Other | Admitting: Family Medicine

## 2019-05-02 VITALS — BP 117/79 | HR 60 | Temp 98.3°F | Wt 202.0 lb

## 2019-05-02 DIAGNOSIS — E782 Mixed hyperlipidemia: Secondary | ICD-10-CM

## 2019-05-02 DIAGNOSIS — K219 Gastro-esophageal reflux disease without esophagitis: Secondary | ICD-10-CM

## 2019-05-02 DIAGNOSIS — I509 Heart failure, unspecified: Secondary | ICD-10-CM | POA: Diagnosis not present

## 2019-05-02 DIAGNOSIS — Z780 Asymptomatic menopausal state: Secondary | ICD-10-CM

## 2019-05-02 DIAGNOSIS — I110 Hypertensive heart disease with heart failure: Secondary | ICD-10-CM | POA: Diagnosis not present

## 2019-05-02 DIAGNOSIS — F419 Anxiety disorder, unspecified: Secondary | ICD-10-CM

## 2019-05-02 DIAGNOSIS — J449 Chronic obstructive pulmonary disease, unspecified: Secondary | ICD-10-CM | POA: Diagnosis not present

## 2019-05-02 DIAGNOSIS — Z1231 Encounter for screening mammogram for malignant neoplasm of breast: Secondary | ICD-10-CM

## 2019-05-02 DIAGNOSIS — M797 Fibromyalgia: Secondary | ICD-10-CM

## 2019-05-02 DIAGNOSIS — F322 Major depressive disorder, single episode, severe without psychotic features: Secondary | ICD-10-CM

## 2019-05-02 NOTE — Assessment & Plan Note (Signed)
Appears euvolemic, continue current regimen with close monitoring

## 2019-05-02 NOTE — Patient Instructions (Signed)
Please call this number to schedule your mammogram. [phone] 

## 2019-05-02 NOTE — Assessment & Plan Note (Signed)
Weaned off buspar, keeps around for prn use. Stable, continue to monitor

## 2019-05-02 NOTE — Assessment & Plan Note (Signed)
Stable and well controlled off medications, continue current regimen

## 2019-05-02 NOTE — Progress Notes (Signed)
BP 117/79   Pulse 60   Temp 98.3 F (36.8 C) (Oral)   Wt 202 lb (91.6 kg)   LMP  (LMP Unknown)   SpO2 94%   BMI 33.10 kg/m    Subjective:    Patient ID: Brandi Steele, female    DOB: 10-24-1955, 64 y.o.   MRN: [id number]  HPI: Brandi Steele is a 64 y.o. female  Chief Complaint  Patient presents with  . Hypertension  . Hyperlipidemia  . Depression  . Anxiety   Here today for 6 month f/u chronic conditions.   HTN, HF - 110/70s range on home readings. Lasix as needed, hasn't taken any lasix since last year some time. Denies SOB, CP, leg edema, dizziness, syncope.   HLD, CAD, carotid stenosis - On lipitor, plavix, regimen and tolerating well. No bleeding or bruising issues. Eating healthy and staying active.   Fibromyalgia - Previously on gabapentin, was able to wean off of that and doing very well with just staying active. Prn flexeril.   Anxiety and depression - weaning herself off buspar, doing well on that so far. Has not taken more than 3 xanax since starting on the prescriptions over a year ago. Denies SI/HI, mood swings, frequent panic attacks.   Aortic aneurysm - following with vascular for ultrasound screening, no progression the past year.   Depression screen [facility] 2/9 05/02/2019 11/17/2018 09/01/2018  Decreased Interest 0 0 0  Down, Depressed, Hopeless 0 1 2  PHQ - 2 Score 0 1 2  Altered sleeping 0 1 0  Tired, decreased energy 1 1 1   Change in appetite 0 0 1  Feeling bad or failure about yourself  0 0 1  Trouble concentrating 0 1 0  Moving slowly or fidgety/restless 0 0 0  Suicidal thoughts 0 0 0  PHQ-9 Score 1 4 5   Some recent data might be hidden   GAD 7 : Generalized Anxiety Score 05/02/2019 09/01/2018 10/23/2017 12/26/2016  Nervous, Anxious, on Edge 0 0 2 1  Control/stop worrying 0 0 2 2  Worry too much - different things 0 0 2 2  Trouble relaxing 0 0 2 0  Restless 0 0 0 0  Easily annoyed or irritable 0 2 0 0  Afraid - awful might happen 0 0 2 2  Total  GAD 7 Score 0 2 10 7    Relevant past medical, surgical, family and social history reviewed and updated as indicated. Interim medical history since our last visit reviewed. Allergies and medications reviewed and updated.  Review of Systems  Per HPI unless specifically indicated above     Objective:    BP 117/79   Pulse 60   Temp 98.3 F (36.8 C) (Oral)   Wt 202 lb (91.6 kg)   LMP  (LMP Unknown)   SpO2 94%   BMI 33.10 kg/m   Wt Readings from Last 3 Encounters:  05/02/19 202 lb (91.6 kg)  03/03/19 200 lb 8 oz (90.9 kg)  02/24/19 200 lb (90.7 kg)    Physical Exam Vitals and nursing note reviewed.  Constitutional:      Appearance: Normal appearance. She is not ill-appearing.  HENT:     Head: Atraumatic.  Eyes:     Extraocular Movements: Extraocular movements intact.     Conjunctiva/sclera: Conjunctivae normal.  Cardiovascular:     Rate and Rhythm: Normal rate and regular rhythm.     Heart sounds: Normal heart sounds.  Pulmonary:     Effort: Pulmonary  effort is normal.     Breath sounds: Normal breath sounds.  Musculoskeletal:        General: No swelling. Normal range of motion.     Cervical back: Normal range of motion and neck supple.  Skin:    General: Skin is warm and dry.  Neurological:     Mental Status: She is alert and oriented to person, place, and time.  Psychiatric:        Mood and Affect: Mood normal.        Thought Content: Thought content normal.        Judgment: Judgment normal.     Results for orders placed or performed in visit on 11/17/18  HgB A1c  Result Value Ref Range   Hgb A1c MFr Bld 5.6 4.8 - 5.6 %   Est. average glucose Bld gHb Est-mCnc 114 mg/dL  Comprehensive metabolic panel  Result Value Ref Range   Glucose 94 65 - 99 mg/dL   BUN 12 8 - 27 mg/dL   Creatinine, Ser 0.81 0.57 - 1.00 mg/dL   GFR calc non Af Amer 76 >59 mL/min/1.73   GFR calc Af Amer 88 >59 mL/min/1.73   BUN/Creatinine Ratio 15 12 - 28   Sodium 143 134 - 144 mmol/L    Potassium 4.6 3.5 - 5.2 mmol/L   Chloride 104 96 - 106 mmol/L   CO2 25 20 - 29 mmol/L   Calcium 9.8 8.7 - 10.3 mg/dL   Total Protein 6.9 6.0 - 8.5 g/dL   Albumin 4.7 3.8 - 4.8 g/dL   Globulin, Total 2.2 1.5 - 4.5 g/dL   Albumin/Globulin Ratio 2.1 1.2 - 2.2   Bilirubin Total 0.3 0.0 - 1.2 mg/dL   Alkaline Phosphatase 77 39 - 117 IU/L   AST 20 0 - 40 IU/L   ALT 16 0 - 32 IU/L  Lipid Panel w/o Chol/HDL Ratio  Result Value Ref Range   Cholesterol, Total 144 100 - 199 mg/dL   Triglycerides 135 0 - 149 mg/dL   HDL 32 (L) >39 mg/dL   VLDL Cholesterol Cal 24 5 - 40 mg/dL   LDL Chol Calc (NIH) 88 0 - 99 mg/dL  Magnesium  Result Value Ref Range   Magnesium 1.8 1.6 - 2.3 mg/dL      Assessment & Plan:   Problem List Items Addressed This Visit      Cardiovascular and Mediastinum   CHF (congestive heart failure) (HCC)    Appears euvolemic, continue current regimen with close monitoring      Hypertensive heart disease    Stable and under good control, continue current regimen      Relevant Orders   Comprehensive metabolic panel     Respiratory   COPD (chronic obstructive pulmonary disease) (HCC)    Stable and under good control, almost never has to use inhalers but keeps on hand        Digestive   GERD (gastroesophageal reflux disease)    Stable and well controlled, continue current regimen        Other   Fibromyalgia    Stable off gabapentin at this time, continue to monitor      Hyperlipidemia - Primary    Stable, recheck lipids and adjust as needed      Relevant Orders   Lipid Panel w/o Chol/HDL Ratio   Depression    Stable and well controlled off medications, continue current regimen      Anxiety    Weaned off buspar,  keeps around for prn use. Stable, continue to monitor       Other Visit Diagnoses    Encounter for screening mammogram for malignant neoplasm of breast       Relevant Orders   MM DIGITAL SCREENING BILATERAL   Postmenopausal estrogen  deficiency       Relevant Orders   DG Bone Density       Follow up plan: Return in about 6 months (around 11/01/2019) for CPE.

## 2019-05-02 NOTE — Assessment & Plan Note (Signed)
Stable off gabapentin at this time, continue to monitor

## 2019-05-02 NOTE — Assessment & Plan Note (Signed)
Stable and under good control, continue current regimen 

## 2019-05-02 NOTE — Assessment & Plan Note (Signed)
Stable, recheck lipids and adjust as needed

## 2019-05-02 NOTE — Assessment & Plan Note (Signed)
Stable and well controlled, continue current regimen 

## 2019-05-02 NOTE — Assessment & Plan Note (Signed)
Stable and under good control, almost never has to use inhalers but keeps on hand

## 2019-05-03 LAB — COMPREHENSIVE METABOLIC PANEL
ALT: 11 IU/L (ref 0–32)
AST: 14 IU/L (ref 0–40)
Albumin/Globulin Ratio: 2.1 (ref 1.2–2.2)
Albumin: 4.6 g/dL (ref 3.8–4.8)
Alkaline Phosphatase: 77 IU/L (ref 39–117)
BUN/Creatinine Ratio: 18 (ref 12–28)
BUN: 16 mg/dL (ref 8–27)
Bilirubin Total: 0.3 mg/dL (ref 0.0–1.2)
CO2: 23 mmol/L (ref 20–29)
Calcium: 9.4 mg/dL (ref 8.7–10.3)
Chloride: 104 mmol/L (ref 96–106)
Creatinine, Ser: 0.87 mg/dL (ref 0.57–1.00)
GFR calc Af Amer: 81 mL/min/{1.73_m2} (ref >59)
GFR calc non Af Amer: 70 mL/min/{1.73_m2} (ref >59)
Globulin, Total: 2.2 g/dL (ref 1.5–4.5)
Glucose: 98 mg/dL (ref 65–99)
Potassium: 4.8 mmol/L (ref 3.5–5.2)
Sodium: 142 mmol/L (ref 134–144)
Total Protein: 6.8 g/dL (ref 6.0–8.5)

## 2019-05-03 LAB — LIPID PANEL W/O CHOL/HDL RATIO
Cholesterol, Total: 152 mg/dL (ref 100–199)
HDL: 37 mg/dL — ABNORMAL LOW (ref >39)
LDL Chol Calc (NIH): 91 mg/dL (ref 0–99)
Triglycerides: 137 mg/dL (ref 0–149)
VLDL Cholesterol Cal: 24 mg/dL (ref 5–40)

## 2019-05-13 ENCOUNTER — Other Ambulatory Visit: Payer: Self-pay | Admitting: Family Medicine

## 2019-05-13 NOTE — Telephone Encounter (Signed)
Requested Prescriptions  Pending Prescriptions Disp Refills  . clopidogrel (PLAVIX) 75 MG tablet [Pharmacy Med Name: CLOPIDOGREL 75MG  TABLETS] 90 tablet 0    Sig: TAKE 1 TABLET BY MOUTH DAILY     Hematology: Antiplatelets - clopidogrel Failed - 05/13/2019  7:10 AM      Failed - Evaluate AST, ALT within 2 months of therapy initiation.      Failed - HCT in normal range and within 180 days    Hematocrit  Date Value Ref Range Status  09/01/2018 40.4 34.0 - 46.6 % Final         Failed - HGB in normal range and within 180 days    Hemoglobin  Date Value Ref Range Status  09/01/2018 13.3 11.1 - 15.9 g/dL Final         Failed - PLT in normal range and within 180 days    Platelets  Date Value Ref Range Status  09/01/2018 174 150 - 450 x10E3/uL Final         Passed - ALT in normal range and within 360 days    ALT  Date Value Ref Range Status  05/02/2019 11 0 - 32 IU/L Final   SGPT (ALT)  Date Value Ref Range Status  06/16/2013 17 12 - 78 U/L Final         Passed - AST in normal range and within 360 days    AST  Date Value Ref Range Status  05/02/2019 14 0 - 40 IU/L Final   SGOT(AST)  Date Value Ref Range Status  06/16/2013 17 15 - 37 Unit/L Final         Passed - Valid encounter within last 6 months    Recent Outpatient Visits          1 week ago Mixed hyperlipidemia   [place], [place], [place]   2 months ago Acute right-sided low back pain without sciatica   [facility], [place], [place]   5 months ago Allergic reaction, initial encounter   [place], [place], [place]   5 months ago [place], [place], [place]   8 months ago Chronic obstructive pulmonary disease, unspecified COPD type [facility])   [place], [place], [place]             . lisinopril (ZESTRIL) 2.5 MG tablet [Pharmacy Med Name: LISINOPRIL 2.5MG  TABLETS] 90 tablet 1     Sig: TAKE 1 TABLET(2.5 MG) BY MOUTH DAILY     Cardiovascular:  ACE Inhibitors Passed - 05/13/2019  7:10 AM      Passed - Cr in normal range and within 180 days    Creatinine  Date Value Ref Range Status  06/16/2013 0.63 0.60 - 1.30 mg/dL Final   Creatinine, Ser  Date Value Ref Range Status  05/02/2019 0.87 0.57 - 1.00 mg/dL Final         Passed - K in normal range and within 180 days    Potassium  Date Value Ref Range Status  05/02/2019 4.8 3.5 - 5.2 mmol/L Final  06/16/2013 4.0 3.5 - 5.1 mmol/L Final         Passed - Patient is not pregnant      Passed - Last BP in normal range    BP Readings from Last 1 Encounters:  05/02/19 117/79         Passed - Valid encounter within last 6 months  Recent Outpatient Visits          1 week ago Mixed hyperlipidemia   [place], [place]   2 months ago Acute right-sided low back pain without sciatica   [place], [place]   5 months ago Allergic reaction, initial encounter   [facility] [place], [place]   5 months ago [place], [place], [place]   8 months ago Chronic obstructive pulmonary disease, unspecified COPD type [facility])   [facility] [place], [place]

## 2019-07-01 ENCOUNTER — Ambulatory Visit (INDEPENDENT_AMBULATORY_CARE_PROVIDER_SITE_OTHER): Payer: Medicare Other | Admitting: Family Medicine

## 2019-07-01 ENCOUNTER — Other Ambulatory Visit: Payer: Self-pay

## 2019-07-01 ENCOUNTER — Encounter: Payer: Self-pay | Admitting: Family Medicine

## 2019-07-01 VITALS — BP 117/76 | HR 69 | Temp 98.0°F | Wt 199.0 lb

## 2019-07-01 DIAGNOSIS — M25531 Pain in right wrist: Secondary | ICD-10-CM | POA: Diagnosis not present

## 2019-07-01 MED ORDER — PREDNISONE 10 MG PO TABS: ORAL_TABLET | ORAL | 0 refills | Status: DC

## 2019-07-01 NOTE — Patient Instructions (Signed)
Voltaren gel as needed, can try cutting flexeril in half to see if that helps. Take several weeks off the lipitor and see if this helps. If not go back on and take the prednisone.

## 2019-07-01 NOTE — Progress Notes (Signed)
BP 117/76   Pulse 69   Temp 98 F (36.7 C) (Oral)   Wt 199 lb (90.3 kg)   LMP  (LMP Unknown)   SpO2 94%   BMI 32.61 kg/m    Subjective:    Patient ID: Brandi Steele, female    DOB: [birth year], 64 y.o.   MRN: [id number]  HPI: Brandi Steele is a 64 y.o. female  Chief Complaint  Patient presents with  . Arm Pain    right arm and hand pain and aches. ongoing 2 months   Right arm and hand pain worsening over the past 2 months. Strong dull ache in wrist and forearm, hand locking up at times. Denies redness, swelling, rashes, fever, chills, known injury. Being on computer or chopping food for dinner tends to make things worse. Taking tylenol here and there with minimal relief. Does have flexeril which she uses here and there, has not noted much benefit. Sometimes lesser amount of pain in left arm. Only recent medication change is increasing lipitor dose.   Relevant past medical, surgical, family and social history reviewed and updated as indicated. Interim medical history since our last visit reviewed. Allergies and medications reviewed and updated.  Review of Systems  Per HPI unless specifically indicated above     Objective:    BP 117/76   Pulse 69   Temp 98 F (36.7 C) (Oral)   Wt 199 lb (90.3 kg)   LMP  (LMP Unknown)   SpO2 94%   BMI 32.61 kg/m   Wt Readings from Last 3 Encounters:  07/01/19 199 lb (90.3 kg)  05/02/19 202 lb (91.6 kg)  03/03/19 200 lb 8 oz (90.9 kg)    Physical Exam Vitals and nursing note reviewed.  Constitutional:      Appearance: Normal appearance. She is not ill-appearing.  HENT:     Head: Atraumatic.  Eyes:     Extraocular Movements: Extraocular movements intact.     Conjunctiva/sclera: Conjunctivae normal.  Cardiovascular:     Rate and Rhythm: Normal rate and regular rhythm.     Heart sounds: Normal heart sounds.  Pulmonary:     Effort: Pulmonary effort is normal.     Breath sounds: Normal breath sounds.  Musculoskeletal:       General: No swelling or deformity. Normal range of motion.     Cervical back: Normal range of motion and neck supple.  Skin:    General: Skin is warm and dry.     Findings: No erythema or rash.  Neurological:     Mental Status: She is alert and oriented to person, place, and time.     Sensory: No sensory deficit.     Motor: Weakness (right hand grip strength less than left) present.  Psychiatric:        Mood and Affect: Mood normal.        Thought Content: Thought content normal.        Judgment: Judgment normal.     Results for orders placed or performed in visit on 05/02/19  Comprehensive metabolic panel  Result Value Ref Range   Glucose 98 65 - 99 mg/dL   BUN 16 8 - 27 mg/dL   Creatinine, Ser 0.87 0.57 - 1.00 mg/dL   GFR calc non Af Amer 70 >59 mL/min/1.73   GFR calc Af Amer 81 >59 mL/min/1.73   BUN/Creatinine Ratio 18 12 - 28   Sodium 142 134 - 144 mmol/L   Potassium 4.8 3.5 - 5.2  mmol/L   Chloride 104 96 - 106 mmol/L   CO2 23 20 - 29 mmol/L   Calcium 9.4 8.7 - 10.3 mg/dL   Total Protein 6.8 6.0 - 8.5 g/dL   Albumin 4.6 3.8 - 4.8 g/dL   Globulin, Total 2.2 1.5 - 4.5 g/dL   Albumin/Globulin Ratio 2.1 1.2 - 2.2   Bilirubin Total 0.3 0.0 - 1.2 mg/dL   Alkaline Phosphatase 77 39 - 117 IU/L   AST 14 0 - 40 IU/L   ALT 11 0 - 32 IU/L  Lipid Panel w/o Chol/HDL Ratio  Result Value Ref Range   Cholesterol, Total 152 100 - 199 mg/dL   Triglycerides 137 0 - 149 mg/dL   HDL 37 (L) >39 mg/dL   VLDL Cholesterol Cal 24 5 - 40 mg/dL   LDL Chol Calc (NIH) 91 0 - 99 mg/dL      Assessment & Plan:   Problem List Items Addressed This Visit    None    Visit Diagnoses    Right wrist pain    -  Primary   Not quite consistent wtih carpal tunnel, suspect arthritis flare vs statin side effect. Take holiday from statin x 2 wk, diclofenac gel prn and prednisone taper       Follow up plan: Return for as scheduled.

## 2019-07-18 ENCOUNTER — Other Ambulatory Visit: Payer: Self-pay | Admitting: Family Medicine

## 2019-07-18 ENCOUNTER — Encounter: Payer: Self-pay | Admitting: Family Medicine

## 2019-07-18 MED ORDER — ROSUVASTATIN CALCIUM 10 MG PO TABS: 10.0000 mg | ORAL_TABLET | Freq: Every day | ORAL | 1 refills | Status: DC

## 2019-08-11 ENCOUNTER — Other Ambulatory Visit: Payer: Self-pay | Admitting: Family Medicine

## 2019-09-07 ENCOUNTER — Other Ambulatory Visit: Payer: Self-pay | Admitting: Family Medicine

## 2019-09-07 ENCOUNTER — Other Ambulatory Visit: Payer: Self-pay

## 2019-09-07 ENCOUNTER — Ambulatory Visit (INDEPENDENT_AMBULATORY_CARE_PROVIDER_SITE_OTHER): Payer: Medicare Other | Admitting: Family Medicine

## 2019-09-07 ENCOUNTER — Encounter: Payer: Self-pay | Admitting: Family Medicine

## 2019-09-07 VITALS — BP 118/76 | HR 70 | Temp 98.1°F | Wt 198.0 lb

## 2019-09-07 DIAGNOSIS — L989 Disorder of the skin and subcutaneous tissue, unspecified: Secondary | ICD-10-CM | POA: Diagnosis not present

## 2019-09-07 DIAGNOSIS — R7301 Impaired fasting glucose: Secondary | ICD-10-CM | POA: Diagnosis not present

## 2019-09-07 DIAGNOSIS — J449 Chronic obstructive pulmonary disease, unspecified: Secondary | ICD-10-CM | POA: Diagnosis not present

## 2019-09-07 HISTORY — DX: Impaired fasting glucose: R73.01

## 2019-09-07 MED ORDER — LISINOPRIL 2.5 MG PO TABS: ORAL_TABLET | ORAL | 1 refills | Status: DC

## 2019-09-07 MED ORDER — BUSPIRONE HCL 10 MG PO TABS: 10.0000 mg | ORAL_TABLET | Freq: Two times a day (BID) | ORAL | 2 refills | Status: DC

## 2019-09-07 MED ORDER — FUROSEMIDE 40 MG PO TABS: 40.0000 mg | ORAL_TABLET | Freq: Every day | ORAL | 3 refills | Status: DC | PRN

## 2019-09-07 MED ORDER — BUDESONIDE-FORMOTEROL FUMARATE 160-4.5 MCG/ACT IN AERO: 2.0000 | INHALATION_SPRAY | Freq: Two times a day (BID) | RESPIRATORY_TRACT | 5 refills | Status: DC

## 2019-09-07 MED ORDER — CLOPIDOGREL BISULFATE 75 MG PO TABS: 75.0000 mg | ORAL_TABLET | Freq: Every day | ORAL | 1 refills | Status: DC

## 2019-09-07 MED ORDER — MELOXICAM 15 MG PO TABS: ORAL_TABLET | ORAL | 3 refills | Status: DC

## 2019-09-07 MED ORDER — ROSUVASTATIN CALCIUM 10 MG PO TABS: 10.0000 mg | ORAL_TABLET | Freq: Every day | ORAL | 1 refills | Status: DC

## 2019-09-07 NOTE — Progress Notes (Signed)
BP 118/76   Pulse 70   Temp 98.1 F (36.7 C) (Oral)   Wt 198 lb (89.8 kg)   LMP  (LMP Unknown)   SpO2 93%   BMI 32.45 kg/m    Subjective:    Patient ID: Brandi Steele, female    DOB: 1955-01-28, 64 y.o.   MRN: [id number]  HPI: Brandi Steele is a 64 y.o. female  Chief Complaint  Patient presents with  . Follow-up    pt states house calls nurse check her sugar and was 5.7. nurse recommended pt to f/u with PCP  . Molds    on upper back   Was examined by the home visit doctor for her insurance and was found to have an A1C of 5.7. Concerned about this, has started walking daily, cut out sweet tea, drinking more water. Denies polyphagia, polydipsia, polyuria. Strong fhx of DM.   Also has numerous skin tags and seb Ks that she would like removed from neck and trunk. Has had some treated with cryotherapy in the past with resolution.   Relevant past medical, surgical, family and social history reviewed and updated as indicated. Interim medical history since our last visit reviewed. Allergies and medications reviewed and updated.  Review of Systems  Per HPI unless specifically indicated above     Objective:    BP 118/76   Pulse 70   Temp 98.1 F (36.7 C) (Oral)   Wt 198 lb (89.8 kg)   LMP  (LMP Unknown)   SpO2 93%   BMI 32.45 kg/m   Wt Readings from Last 3 Encounters:  09/07/19 198 lb (89.8 kg)  07/01/19 199 lb (90.3 kg)  05/02/19 202 lb (91.6 kg)    Physical Exam Vitals and nursing note reviewed.  Constitutional:      Appearance: Normal appearance. She is not ill-appearing.  HENT:     Head: Atraumatic.  Eyes:     Extraocular Movements: Extraocular movements intact.     Conjunctiva/sclera: Conjunctivae normal.  Cardiovascular:     Rate and Rhythm: Normal rate and regular rhythm.     Heart sounds: Normal heart sounds.  Pulmonary:     Effort: Pulmonary effort is normal.     Breath sounds: Normal breath sounds.  Musculoskeletal:        General: Normal  range of motion.     Cervical back: Normal range of motion and neck supple.  Skin:    General: Skin is warm and dry.     Findings: Lesion (numerous small skin tags present around neck, several dark seborrheic keratoses present on trunk) present.  Neurological:     Mental Status: She is alert and oriented to person, place, and time.  Psychiatric:        Mood and Affect: Mood normal.        Thought Content: Thought content normal.        Judgment: Judgment normal.     Results for orders placed or performed in visit on 05/02/19  Comprehensive metabolic panel  Result Value Ref Range   Glucose 98 65 - 99 mg/dL   BUN 16 8 - 27 mg/dL   Creatinine, Ser 0.87 0.57 - 1.00 mg/dL   GFR calc non Af Amer 70 >59 mL/min/1.73   GFR calc Af Amer 81 >59 mL/min/1.73   BUN/Creatinine Ratio 18 12 - 28   Sodium 142 134 - 144 mmol/L   Potassium 4.8 3.5 - 5.2 mmol/L   Chloride 104 96 - 106 mmol/L  CO2 23 20 - 29 mmol/L   Calcium 9.4 8.7 - 10.3 mg/dL   Total Protein 6.8 6.0 - 8.5 g/dL   Albumin 4.6 3.8 - 4.8 g/dL   Globulin, Total 2.2 1.5 - 4.5 g/dL   Albumin/Globulin Ratio 2.1 1.2 - 2.2   Bilirubin Total 0.3 0.0 - 1.2 mg/dL   Alkaline Phosphatase 77 39 - 117 IU/L   AST 14 0 - 40 IU/L   ALT 11 0 - 32 IU/L  Lipid Panel w/o Chol/HDL Ratio  Result Value Ref Range   Cholesterol, Total 152 100 - 199 mg/dL   Triglycerides 137 0 - 149 mg/dL   HDL 37 (L) >39 mg/dL   VLDL Cholesterol Cal 24 5 - 40 mg/dL   LDL Chol Calc (NIH) 91 0 - 99 mg/dL      Assessment & Plan:   Problem List Items Addressed This Visit      Respiratory   COPD (chronic obstructive pulmonary disease) (HCC)    Stable, continue inhaler regimen      Relevant Medications   budesonide-formoterol (SYMBICORT) 160-4.5 MCG/ACT inhaler     Endocrine   IFG (impaired fasting glucose) - Primary    Diet and exercise reviewed, pt wanting to manage with lifestyle modifications. WIll recheck labs in 3 months       Other Visit Diagnoses      Skin lesion       Multiple skin tags and seb k's present, discussed several mgmt options. pt opting for removal. Will schedule procedure visit to do so       Follow up plan: Return in about 3 months (around 12/08/2019) for IFG.

## 2019-09-11 NOTE — Assessment & Plan Note (Signed)
Diet and exercise reviewed, pt wanting to manage with lifestyle modifications. WIll recheck labs in 3 months

## 2019-09-11 NOTE — Assessment & Plan Note (Signed)
Stable, continue inhaler regimen

## 2019-09-16 ENCOUNTER — Other Ambulatory Visit: Payer: Self-pay

## 2019-09-16 ENCOUNTER — Encounter: Payer: Self-pay | Admitting: Family Medicine

## 2019-09-16 ENCOUNTER — Other Ambulatory Visit (HOSPITAL_COMMUNITY)
Admission: RE | Admit: 2019-09-16 | Discharge: 2019-09-16 | Disposition: A | Discharge status: Home / Self Care | Payer: Medicare Other | Source: Ambulatory Visit | Attending: Family Medicine | Admitting: Family Medicine

## 2019-09-16 ENCOUNTER — Ambulatory Visit (INDEPENDENT_AMBULATORY_CARE_PROVIDER_SITE_OTHER): Payer: Medicare Other | Admitting: Family Medicine

## 2019-09-16 VITALS — HR 80 | Temp 98.5°F | Wt 199.0 lb

## 2019-09-16 DIAGNOSIS — D489 Neoplasm of uncertain behavior, unspecified: Secondary | ICD-10-CM | POA: Diagnosis present

## 2019-09-16 DIAGNOSIS — L820 Inflamed seborrheic keratosis: Secondary | ICD-10-CM | POA: Diagnosis not present

## 2019-09-16 DIAGNOSIS — L918 Other hypertrophic disorders of the skin: Secondary | ICD-10-CM

## 2019-09-16 NOTE — Progress Notes (Signed)
Pulse 80   Temp 98.5 F (36.9 C) (Oral)   Wt 199 lb (90.3 kg)   LMP  (LMP Unknown)   SpO2 (!) 89%   BMI 32.61 kg/m    Subjective:    Patient ID: Brandi Steele, female    DOB: 1955/11/11, 64 y.o.   MRN: [id number]  HPI: Brandi Steele is a 64 y.o. female  Chief Complaint  Patient presents with  . Skin Problem    skin tags removal   Here today for procedure visit to remove numerous skin tags and seborrheic keratoses on neck and trunk. These lesions continue to get caught on her clothes and sometimes bleed when irritated. Has had several lesions removed in the past without issue.   Relevant past medical, surgical, family and social history reviewed and updated as indicated. Interim medical history since our last visit reviewed. Allergies and medications reviewed and updated.  Review of Systems  Per HPI unless specifically indicated above     Objective:    Pulse 80   Temp 98.5 F (36.9 C) (Oral)   Wt 199 lb (90.3 kg)   LMP  (LMP Unknown)   SpO2 (!) 89%   BMI 32.61 kg/m   Wt Readings from Last 3 Encounters:  09/16/19 199 lb (90.3 kg)  09/07/19 198 lb (89.8 kg)  07/01/19 199 lb (90.3 kg)    Physical Exam Vitals and nursing note reviewed.  Constitutional:      Appearance: Normal appearance. She is not ill-appearing.  HENT:     Head: Atraumatic.  Eyes:     Extraocular Movements: Extraocular movements intact.     Conjunctiva/sclera: Conjunctivae normal.  Cardiovascular:     Rate and Rhythm: Normal rate and regular rhythm.     Heart sounds: Normal heart sounds.  Pulmonary:     Effort: Pulmonary effort is normal.     Breath sounds: Normal breath sounds.  Musculoskeletal:        General: Normal range of motion.     Cervical back: Normal range of motion and neck supple.  Skin:    General: Skin is warm and dry.     Comments: 1 seb K right chest, 2 right lateral upper back Numerous skin tags b/l neck and one behind left ear  Neurological:     Mental Status:  She is alert and oriented to person, place, and time.  Psychiatric:        Mood and Affect: Mood normal.        Thought Content: Thought content normal.        Judgment: Judgment normal.    Procedure: Shave excision multiple seborrheic keratoses Procedure explained in detail, questions answered adequately. All 3 areas cleaned with alcohol, infiltrated with 2-3 cc 1% lidocaine with epi, cleaned again with betadine, and shaved flush with surrounding tissue using dermablade. Silver nitrate sticks and cautery tool used for hemostasis. Blood loss minimal, procedure well tolerated. Specimens sent for pathology testing. Home wound care reviewed. Areas dressed with triple antibiotic ointment, gauze, paper tape.     Results for orders placed or performed in visit on 05/02/19  Comprehensive metabolic panel  Result Value Ref Range   Glucose 98 65 - 99 mg/dL   BUN 16 8 - 27 mg/dL   Creatinine, Ser 0.87 0.57 - 1.00 mg/dL   GFR calc non Af Amer 70 >59 mL/min/1.73   GFR calc Af Amer 81 >59 mL/min/1.73   BUN/Creatinine Ratio 18 12 - 28   Sodium 142  134 - 144 mmol/L   Potassium 4.8 3.5 - 5.2 mmol/L   Chloride 104 96 - 106 mmol/L   CO2 23 20 - 29 mmol/L   Calcium 9.4 8.7 - 10.3 mg/dL   Total Protein 6.8 6.0 - 8.5 g/dL   Albumin 4.6 3.8 - 4.8 g/dL   Globulin, Total 2.2 1.5 - 4.5 g/dL   Albumin/Globulin Ratio 2.1 1.2 - 2.2   Bilirubin Total 0.3 0.0 - 1.2 mg/dL   Alkaline Phosphatase 77 39 - 117 IU/L   AST 14 0 - 40 IU/L   ALT 11 0 - 32 IU/L  Lipid Panel w/o Chol/HDL Ratio  Result Value Ref Range   Cholesterol, Total 152 100 - 199 mg/dL   Triglycerides 137 0 - 149 mg/dL   HDL 37 (L) >39 mg/dL   VLDL Cholesterol Cal 24 5 - 40 mg/dL   LDL Chol Calc (NIH) 91 0 - 99 mg/dL      Assessment & Plan:   Problem List Items Addressed This Visit    None    Visit Diagnoses    Inflamed skin tag    -  Primary   About 20 skin tags treated with cryotherapy tool without complications. May need re-treatment  in 2 weeks if not resolved   Inflamed seborrheic keratosis       Shave excision/bx performed without complication. Home wound care and return precautions reviewed   Neoplasm of uncertain behavior       Relevant Orders   Surgical pathology   Surgical pathology   Surgical pathology      30 minutes spent today with patient performing care and counseling  Follow up plan: Return if symptoms worsen or fail to improve.

## 2019-09-20 LAB — SURGICAL PATHOLOGY

## 2019-10-17 ENCOUNTER — Ambulatory Visit (INDEPENDENT_AMBULATORY_CARE_PROVIDER_SITE_OTHER): Payer: Medicare Other

## 2019-10-17 VITALS — Ht 66.0 in | Wt 198.0 lb

## 2019-10-17 DIAGNOSIS — Z0000 Encounter for general adult medical examination without abnormal findings: Secondary | ICD-10-CM | POA: Diagnosis not present

## 2019-10-17 NOTE — Patient Instructions (Signed)
[name] , Thank you for taking time to come for your Medicare Wellness Visit. I appreciate your ongoing commitment to your health goals. Please review the following plan we discussed and let me know if I can assist you in the future.   Screening recommendations/referrals: Colonoscopy: decline Mammogram: ordered by PCP Bone Density: ordered by PCP Recommended yearly ophthalmology/optometry visit for glaucoma screening and checkup Recommended yearly dental visit for hygiene and checkup  Vaccinations: Influenza vaccine: decline  Pneumococcal vaccine: completed 03/03/2019 Tdap vaccine: completed 05/28/2015 Shingles vaccine: decline   Covid-19: decline  Advanced directives: Advance directive discussed with you today.    Conditions/risks identified: none  Next appointment: Follow up in one year for your annual wellness visit    Preventive Care 65 Years and Older, Female Preventive care refers to lifestyle choices and visits with your health care provider that can promote health and wellness. What does preventive care include?  A yearly physical exam. This is also called an annual well check.  Dental exams once or twice a year.  Routine eye exams. Ask your health care provider how often you should have your eyes checked.  Personal lifestyle choices, including:  Daily care of your teeth and gums.  Regular physical activity.  Eating a healthy diet.  Avoiding tobacco and drug use.  Limiting alcohol use.  Practicing safe sex.  Taking low-dose aspirin every day.  Taking vitamin and mineral supplements as recommended by your health care provider. What happens during an annual well check? The services and screenings done by your health care provider during your annual well check will depend on your age, overall health, lifestyle risk factors, and family history of disease. Counseling  Your health care provider may ask you questions about your:  Alcohol use.  Tobacco  use.  Drug use.  Emotional well-being.  Home and relationship well-being.  Sexual activity.  Eating habits.  History of falls.  Memory and ability to understand (cognition).  Work and work Statistician.  Reproductive health. Screening  You may have the following tests or measurements:  Height, weight, and BMI.  Blood pressure.  Lipid and cholesterol levels. These may be checked every 5 years, or more frequently if you are over 48 years old.  Skin check.  Lung cancer screening. You may have this screening every year starting at age 76 if you have a 30-pack-year history of smoking and currently smoke or have quit within the past 15 years.  Fecal occult blood test (FOBT) of the stool. You may have this test every year starting at age 2.  Flexible sigmoidoscopy or colonoscopy. You may have a sigmoidoscopy every 5 years or a colonoscopy every 10 years starting at age 37.  Hepatitis C blood test.  Hepatitis B blood test.  Sexually transmitted disease (STD) testing.  Diabetes screening. This is done by checking your blood sugar (glucose) after you have not eaten for a while (fasting). You may have this done every 1-3 years.  Bone density scan. This is done to screen for osteoporosis. You may have this done starting at age 34.  Mammogram. This may be done every 1-2 years. Talk to your health care provider about how often you should have regular mammograms. Talk with your health care provider about your test results, treatment options, and if necessary, the need for more tests. Vaccines  Your health care provider may recommend certain vaccines, such as:  Influenza vaccine. This is recommended every year.  Tetanus, diphtheria, and acellular pertussis (Tdap, Td) vaccine.  You may need a Td booster every 10 years.  Zoster vaccine. You may need this after age 64.  Pneumococcal 13-valent conjugate (PCV13) vaccine. One dose is recommended after age 60.  Pneumococcal  polysaccharide (PPSV23) vaccine. One dose is recommended after age 23. Talk to your health care provider about which screenings and vaccines you need and how often you need them. This information is not intended to replace advice given to you by your health care provider. Make sure you discuss any questions you have with your health care provider. Document Released: 02/09/2015 Document Revised: 10/03/2015 Document Reviewed: 11/14/2014 Elsevier Interactive Patient Education  2017 [employer] Prevention in the Home Falls can cause injuries. They can happen to people of all ages. There are many things you can do to make your home safe and to help prevent falls. What can I do on the outside of my home?  Regularly fix the edges of walkways and driveways and fix any cracks.  Remove anything that might make you trip as you walk through a door, such as a raised step or threshold.  Trim any bushes or trees on the path to your home.  Use bright outdoor lighting.  Clear any walking paths of anything that might make someone trip, such as rocks or tools.  Regularly check to see if handrails are loose or broken. Make sure that both sides of any steps have handrails.  Any raised decks and porches should have guardrails on the edges.  Have any leaves, snow, or ice cleared regularly.  Use sand or salt on walking paths during winter.  Clean up any spills in your garage right away. This includes oil or grease spills. What can I do in the bathroom?  Use night lights.  Install grab bars by the toilet and in the tub and shower. Do not use towel bars as grab bars.  Use non-skid mats or decals in the tub or shower.  If you need to sit down in the shower, use a plastic, non-slip stool.  Keep the floor dry. Clean up any water that spills on the floor as soon as it happens.  Remove soap buildup in the tub or shower regularly.  Attach bath mats securely with double-sided non-slip rug  tape.  Do not have throw rugs and other things on the floor that can make you trip. What can I do in the bedroom?  Use night lights.  Make sure that you have a light by your bed that is easy to reach.  Do not use any sheets or blankets that are too big for your bed. They should not hang down onto the floor.  Have a firm chair that has side arms. You can use this for support while you get dressed.  Do not have throw rugs and other things on the floor that can make you trip. What can I do in the kitchen?  Clean up any spills right away.  Avoid walking on wet floors.  Keep items that you use a lot in easy-to-reach places.  If you need to reach something above you, use a strong step stool that has a grab bar.  Keep electrical cords out of the way.  Do not use floor polish or wax that makes floors slippery. If you must use wax, use non-skid floor wax.  Do not have throw rugs and other things on the floor that can make you trip. What can I do with my stairs?  Do not leave any  items on the stairs.  Make sure that there are handrails on both sides of the stairs and use them. Fix handrails that are broken or loose. Make sure that handrails are as long as the stairways.  Check any carpeting to make sure that it is firmly attached to the stairs. Fix any carpet that is loose or worn.  Avoid having throw rugs at the top or bottom of the stairs. If you do have throw rugs, attach them to the floor with carpet tape.  Make sure that you have a light switch at the top of the stairs and the bottom of the stairs. If you do not have them, ask someone to add them for you. What else can I do to help prevent falls?  Wear shoes that:  Do not have high heels.  Have rubber bottoms.  Are comfortable and fit you well.  Are closed at the toe. Do not wear sandals.  If you use a stepladder:  Make sure that it is fully opened. Do not climb a closed stepladder.  Make sure that both sides of the  stepladder are locked into place.  Ask someone to hold it for you, if possible.  Clearly mark and make sure that you can see:  Any grab bars or handrails.  First and last steps.  Where the edge of each step is.  Use tools that help you move around (mobility aids) if they are needed. These include:  Canes.  Walkers.  Scooters.  Crutches.  Turn on the lights when you go into a dark area. Replace any light bulbs as soon as they burn out.  Set up your furniture so you have a clear path. Avoid moving your furniture around.  If any of your floors are uneven, fix them.  If there are any pets around you, be aware of where they are.  Review your medicines with your doctor. Some medicines can make you feel dizzy. This can increase your chance of falling. Ask your doctor what other things that you can do to help prevent falls. This information is not intended to replace advice given to you by your health care provider. Make sure you discuss any questions you have with your health care provider. Document Released: 11/09/2008 Document Revised: 06/21/2015 Document Reviewed: 02/17/2014 Elsevier Interactive Patient Education  2017 [employer].

## 2019-10-17 NOTE — Progress Notes (Signed)
I connected with [name] today by telephone and verified that I am speaking with the correct person using two identifiers. Location patient: home Location provider: work Persons participating in the virtual visit: Emmaline, [place] LPN.   I discussed the limitations, risks, security and privacy concerns of performing an evaluation and management service by telephone and the availability of in person appointments. I also discussed with the patient that there may be a patient responsible charge related to this service. The patient expressed understanding and verbally consented to this telephonic visit.    Interactive audio and video telecommunications were attempted between this provider and patient, however failed, due to patient having technical difficulties OR patient did not have access to video capability.  We continued and completed visit with audio only.     Vital signs may be patient reported or missing.  Subjective:   Brandi Steele is a 64 y.o. female who presents for an Initial Medicare Annual Wellness Visit.  Review of Systems     Cardiac Risk Factors include: advanced age (>28men, >40 women);obesity (BMI >30kg/m2);sedentary lifestyle     Objective:    Today's Vitals   10/17/19 0856  Weight: 198 lb (89.8 kg)  Height: 5\' 6"  (1.676 m)   Body mass index is 31.96 kg/m.  Advanced Directives 10/17/2019 05/22/2015 01/07/2015  Does Patient Have a Medical Advance Directive? No No No  Would patient like information on creating a medical advance directive? - No - patient declined information No - patient declined information    Current Medications (verified) Outpatient Encounter Medications as of 10/17/2019  Medication Sig  . albuterol (VENTOLIN HFA) 108 (90 Base) MCG/ACT inhaler Inhale 2 puffs into the lungs every 6 (six) hours as needed for wheezing or shortness of breath.  . ALPRAZolam (XANAX) 0.5 MG tablet Take 1 tablet (0.5 mg total) by mouth 2 (two) times  daily as needed. for anxiety  . budesonide-formoterol (SYMBICORT) 160-4.5 MCG/ACT inhaler Inhale 2 puffs into the lungs 2 (two) times daily.  . Calcium Carbonate-Vit D-Min (CALCIUM 1200 PO) Take by mouth daily.  . cetirizine (ZYRTEC) 10 MG tablet Take 1 tablet (10 mg total) by mouth daily.  . clopidogrel (PLAVIX) 75 MG tablet Take 1 tablet (75 mg total) by mouth daily.  . cyclobenzaprine (FLEXERIL) 10 MG tablet Take 1 tablet (10 mg total) by mouth 3 (three) times daily as needed for muscle spasms (for firbromyalgia.).  [name] esomeprazole (NEXIUM) 40 MG capsule TAKE ONE CAPSULE BY MOUTH TWICE DAILY BEFORE A MEAL  . furosemide (LASIX) 40 MG tablet Take 1 tablet (40 mg total) by mouth daily as needed.  . hydrOXYzine (ATARAX/VISTARIL) 25 MG tablet Take 1 tablet (25 mg total) by mouth 3 (three) times daily as needed.  [name] lisinopril (ZESTRIL) 2.5 MG tablet TAKE 1 TABLET(2.5 MG) BY MOUTH DAILY  . Magnesium 400 MG CAPS Take by mouth. Take 6 tablets daily  . meloxicam (MOBIC) 15 MG tablet TAKE 1 TABLET(15 MG) BY MOUTH DAILY  . Potassium Gluconate 595 MG CAPS Take by mouth. Take 4 capsules daily  . rosuvastatin (CRESTOR) 10 MG tablet Take 1 tablet (10 mg total) by mouth daily.  [name] triamcinolone cream (KENALOG) 0.1 % Apply 1 application 2 (two) times daily topically.  . busPIRone (BUSPAR) 10 MG tablet Take 1 tablet (10 mg total) by mouth 2 (two) times daily. (Patient not taking: Reported on 10/17/2019)   No facility-administered encounter medications on file as of 10/17/2019.    Allergies (verified) Morphine and  related, Celexa [citalopram hydrobromide], Effexor [venlafaxine], Lipitor [atorvastatin], Shellfish allergy, and Wellbutrin [bupropion]   History: Past Medical History:  Diagnosis Date  . Anxiety   . Aortic stenosis   . CHF (congestive heart failure) ([place])   . COPD (chronic obstructive pulmonary disease) ([place])   . Depression   . Fibromyalgia   . GERD (gastroesophageal reflux disease)   .  Hyperlipidemia   . Hypertension   . Hypertensive heart disease   . Myalgia   . Obesity   . Sciatica   . Seborrheic keratosis   . Stress incontinence    Past Surgical History:  Procedure Laterality Date  . ABDOMINAL HYSTERECTOMY     partial   Family History  Problem Relation Age of Onset  . Stroke Mother   . Hypertension Mother   . Heart disease Father   . Diabetes Brother   . Stroke Maternal Grandmother   . Stroke Maternal Grandfather   . Heart disease Paternal Grandmother   . Heart disease Paternal Grandfather   . Breast cancer Neg Hx    Social History   Socioeconomic History  . Marital status: Married    Spouse name: Not on file  . Number of children: Not on file  . Years of education: Not on file  . Highest education level: Not on file  Occupational History  . Not on file  Tobacco Use  . Smoking status: Former Smoker    Packs/day: 1.00    Years: 40.00    Pack years: 40.00    Types: Cigarettes    Quit date: 11/01/2016    Years since quitting: 2.9  . Smokeless tobacco: Never Used  . Tobacco comment: Is trying cold-turkey with social support  Vaping Use  . Vaping Use: Every day  Substance and Sexual Activity  . Alcohol use: No    Alcohol/week: 0.0 standard drinks  . Drug use: No  . Sexual activity: Not Currently  Other Topics Concern  . Not on file  Social History Narrative  . Not on file   Social Determinants of Health   Financial Resource Strain: Low Risk   . Difficulty of Paying Living Expenses: Not hard at all  Food Insecurity: No Food Insecurity  . Worried About Charity fundraiser in the Last Year: Never true  . Ran Out of Food in the Last Year: Never true  Transportation Needs: No Transportation Needs  . Lack of Transportation (Medical): No  . Lack of Transportation (Non-Medical): No  Physical Activity: Inactive  . Days of Exercise per Week: 0 days  . Minutes of Exercise per Session: 0 min  Stress: No Stress Concern Present  . Feeling of  Stress : Not at all  Social Connections:   . Frequency of Communication with Friends and Family: Not on file  . Frequency of Social Gatherings with Friends and Family: Not on file  . Attends Religious Services: Not on file  . Active Member of Clubs or Organizations: Not on file  . Attends Archivist Meetings: Not on file  . Marital Status: Not on file    Tobacco Counseling Counseling given: Not Answered Comment: Is trying cold-turkey with social support   Clinical Intake:  Pre-visit preparation completed: Yes  Pain : 0-10 Pain Type: Acute pain Pain Location: Generalized Pain Descriptors / Indicators: Sore Pain Onset: 1 to 4 weeks ago Pain Frequency: Constant     Nutritional Status: BMI > 30  Obese Nutritional Risks: None Diabetes: No  How often do  you need to have someone help you when you read instructions, pamphlets, or other written materials from your doctor or pharmacy?: 1 - Never What is the last grade level you completed in school?: some college  Diabetic? no  Interpreter Needed?: No  Information entered by :: NAllen LPN   Activities of Daily Living In your present state of health, do you have any difficulty performing the following activities: 10/17/2019 09/07/2019  Hearing? N N  Vision? N N  Difficulty concentrating or making decisions? N N  Walking or climbing stairs? N N  Dressing or bathing? N N  Doing errands, shopping? N N  Preparing Food and eating ? N -  Using the Toilet? N -  In the past six months, have you accidently leaked urine? Y -  Comment with coughing, wears a pad -  Do you have problems with loss of bowel control? N -  Managing your Medications? N -  Managing your Finances? N -  Housekeeping or managing your Housekeeping? N -  Some recent data might be hidden    Patient Care Team: Volney American, PA-C as PCP - General (Family Medicine) Corey Skains, MD as Consulting Physician (Cardiology)  Indicate any  recent Medical Services you may have received from other than [facility] providers in the past year (date may be approximate).     Assessment:   This is a routine wellness examination for Shantinique.  Hearing/Vision screen  Hearing Screening   125Hz  250Hz  500Hz  1000Hz  2000Hz  3000Hz  4000Hz  6000Hz  8000Hz   Right ear:           Left ear:           Vision Screening Comments: No regular eye exams, [facility]  Dietary issues and exercise activities discussed: Current Exercise Habits: The patient does not participate in regular exercise at present  Goals    . Patient Stated     10/17/2019, wants to weigh 160 pounds      Depression Screen PHQ 2/9 Scores 10/17/2019 09/07/2019 05/02/2019 11/17/2018 09/01/2018 10/23/2017 12/26/2016  PHQ - 2 Score 0 0 0 1 2 0 0  PHQ- 9 Score 0 0 1 4 5 6 3     Fall Risk Fall Risk  10/17/2019 05/02/2019 09/01/2018 11/24/2017 01/11/2016  Falls in the past year? 0 0 0 No No  Number falls in past yr: - 0 - - -  Injury with Fall? - 0 - - -  Risk for fall due to : Medication side effect - - - -  Follow up Falls evaluation completed;Education provided;Falls prevention discussed - Falls evaluation completed - -    Any stairs in or around the home? No  If so, are there any without handrails? n/a Home free of loose throw rugs in walkways, pet beds, electrical cords, etc? Yes  Adequate lighting in your home to reduce risk of falls? Yes   ASSISTIVE DEVICES UTILIZED TO PREVENT FALLS:  Life alert? No  Use of a cane, walker or w/c? No  Grab bars in the bathroom? Yes  Shower chair or bench in shower? No  Elevated toilet seat or a handicapped toilet? Yes   TIMED UP AND GO:  Was the test performed? No . .    Cognitive Function:     6CIT Screen 10/17/2019  What Year? 0 points  What month? 0 points  What time? 0 points  Count back from 20 0 points  Months in reverse 0 points  Repeat phrase 0 points  Total  Score 0    Immunizations Immunization History  Administered  Date(s) Administered  . Fluad Quad(high Dose 65+) 11/17/2018  . Influenza,inj,Quad PF,6+ Mos 10/09/2015, 12/26/2016  . Influenza-Unspecified 11/22/2017  . Pneumococcal Conjugate-13 03/03/2019  . Pneumococcal Polysaccharide-23 03/28/2009  . Td 06/14/2004  . Tdap 05/28/2015    TDAP status: Up to date Flu Vaccine status: Declined, Education has been provided regarding the importance of this vaccine but patient still declined. Advised may receive this vaccine at local pharmacy or Health Dept. Aware to provide a copy of the vaccination record if obtained from local pharmacy or Health Dept. Verbalized acceptance and understanding. Pneumococcal vaccine status: Up to date Covid-19 vaccine status: Completed vaccines  Qualifies for Shingles Vaccine? Yes   Zostavax completed No   Shingrix Completed?: No.    Education has been provided regarding the importance of this vaccine. Patient has been advised to call insurance company to determine out of pocket expense if they have not yet received this vaccine. Advised may also receive vaccine at local pharmacy or Health Dept. Verbalized acceptance and understanding.  Screening Tests Health Maintenance  Topic Date Due  . MAMMOGRAM  09/05/2017  . DEXA SCAN  Never done  . COVID-19 Vaccine (1) 11/02/2019 (Originally 08/24/1967)  . INFLUENZA VACCINE  04/26/2020 (Originally 08/28/2019)  . COLONOSCOPY  05/01/2020 (Originally 08/25/2003)  . PNA vac Low Risk Adult (2 of 2 - PPSV23) 03/02/2020  . TETANUS/TDAP  05/27/2025  . Hepatitis C Screening  Completed    Health Maintenance  Health Maintenance Due  Topic Date Due  . MAMMOGRAM  09/05/2017  . DEXA SCAN  Never done    Colorectal cancer screening: declined Mammogram status: ordered Bone Density status: ordered  Lung Cancer Screening: (Low Dose CT Chest recommended if Age 69-80 years, 30 pack-year currently smoking OR have quit w/in 15years.) does not qualify.   Lung Cancer Screening Referral:  no  Additional Screening:  Hepatitis C Screening: does qualify; Completed 05/28/2015  Vision Screening: Recommended annual ophthalmology exams for early detection of glaucoma and other disorders of the eye. Is the patient up to date with their annual eye exam?  No  Who is the provider or what is the name of the office in which the patient attends annual eye exams? [facility] If pt is not established with a provider, would they like to be referred to a provider to establish care? No .   Dental Screening: Recommended annual dental exams for proper oral hygiene  Community Resource Referral / Chronic Care Management: CRR required this visit?  No   CCM required this visit?  No      Plan:     I have personally reviewed and noted the following in the patient's chart:   . Medical and social history . Use of alcohol, tobacco or illicit drugs  . Current medications and supplements . Functional ability and status . Nutritional status . Physical activity . Advanced directives . List of other physicians . Hospitalizations, surgeries, and ER visits in previous 12 months . Vitals . Screenings to include cognitive, depression, and falls . Referrals and appointments  In addition, I have reviewed and discussed with patient certain preventive protocols, quality metrics, and best practice recommendations. A written personalized care plan for preventive services as well as general preventive health recommendations were provided to patient.     Kellie Simmering, LPN   03/10/2480   Nurse Notes:

## 2019-12-02 ENCOUNTER — Encounter: Payer: Self-pay | Admitting: Nurse Practitioner

## 2019-12-08 ENCOUNTER — Ambulatory Visit (INDEPENDENT_AMBULATORY_CARE_PROVIDER_SITE_OTHER): Payer: Medicare Other | Admitting: Nurse Practitioner

## 2019-12-08 ENCOUNTER — Other Ambulatory Visit: Payer: Self-pay

## 2019-12-08 ENCOUNTER — Encounter: Payer: Self-pay | Admitting: Nurse Practitioner

## 2019-12-08 VITALS — BP 124/80 | HR 63 | Temp 97.9°F | Wt 194.6 lb

## 2019-12-08 DIAGNOSIS — J432 Centrilobular emphysema: Secondary | ICD-10-CM

## 2019-12-08 DIAGNOSIS — I700 Atherosclerosis of aorta: Secondary | ICD-10-CM | POA: Diagnosis not present

## 2019-12-08 DIAGNOSIS — I10 Essential (primary) hypertension: Secondary | ICD-10-CM

## 2019-12-08 DIAGNOSIS — I509 Heart failure, unspecified: Secondary | ICD-10-CM

## 2019-12-08 DIAGNOSIS — E782 Mixed hyperlipidemia: Secondary | ICD-10-CM

## 2019-12-08 DIAGNOSIS — Z87891 Personal history of nicotine dependence: Secondary | ICD-10-CM

## 2019-12-08 DIAGNOSIS — F410 Panic disorder [episodic paroxysmal anxiety] without agoraphobia: Secondary | ICD-10-CM

## 2019-12-08 DIAGNOSIS — R8281 Pyuria: Secondary | ICD-10-CM

## 2019-12-08 DIAGNOSIS — R7301 Impaired fasting glucose: Secondary | ICD-10-CM

## 2019-12-08 DIAGNOSIS — I2510 Atherosclerotic heart disease of native coronary artery without angina pectoris: Secondary | ICD-10-CM

## 2019-12-08 DIAGNOSIS — E6609 Other obesity due to excess calories: Secondary | ICD-10-CM

## 2019-12-08 DIAGNOSIS — Z6831 Body mass index (BMI) 31.0-31.9, adult: Secondary | ICD-10-CM

## 2019-12-08 LAB — URINALYSIS, ROUTINE W REFLEX MICROSCOPIC
Bilirubin, UA: NEGATIVE
Glucose, UA: NEGATIVE
Ketones, UA: NEGATIVE
Nitrite, UA: NEGATIVE
Protein,UA: NEGATIVE
RBC, UA: NEGATIVE
Specific Gravity, UA: 1.015 (ref 1.005–1.030)
Urobilinogen, Ur: 0.2 mg/dL (ref 0.2–1.0)
pH, UA: 8.5 — ABNORMAL HIGH (ref 5.0–7.5)

## 2019-12-08 LAB — MICROALBUMIN, URINE WAIVED
Creatinine, Urine Waived: 50 mg/dL (ref 10–300)
Microalb, Ur Waived: 30 mg/L — ABNORMAL HIGH (ref 0–19)

## 2019-12-08 LAB — MICROSCOPIC EXAMINATION
Bacteria, UA: NONE SEEN
RBC, Urine: NONE SEEN /hpf (ref 0–2)

## 2019-12-08 LAB — WET PREP FOR TRICH, YEAST, CLUE
Clue Cell Exam: NEGATIVE
Trichomonas Exam: NEGATIVE
Yeast Exam: NEGATIVE

## 2019-12-08 LAB — BAYER DCA HB A1C WAIVED: HB A1C (BAYER DCA - WAIVED): 5.5 % (ref <7.0)

## 2019-12-08 MED ORDER — ALPRAZOLAM 0.5 MG PO TABS
0.5000 mg | ORAL_TABLET | Freq: Two times a day (BID) | ORAL | 0 refills | Status: DC | PRN
Start: 2019-12-08 — End: 2020-12-05

## 2019-12-08 NOTE — Assessment & Plan Note (Signed)
Recommended eating smaller high protein, low fat meals more frequently and exercising 30 mins a day 5 times a week with a goal of 10-15lb weight loss in the next 3 months. Patient voiced their understanding and motivation to adhere to these recommendations.  

## 2019-12-08 NOTE — Assessment & Plan Note (Signed)
Chronic, ongoing.  Continue statin and ASA daily.  Will continue collaboration with cardiology.

## 2019-12-08 NOTE — Assessment & Plan Note (Signed)
Chronic, ongoing.  Continue current inhaler regimen and adjust as needed.  Recommend she continue to attend yearly lung CT screening for prevention.  Will plan on spirometry next visit.  Recommend cessation of vaping.  Return in 6 months to meet new PCP.

## 2019-12-08 NOTE — Progress Notes (Signed)
BP 124/80   Pulse 63   Temp 97.9 F (36.6 C)   Wt 194 lb 9.6 oz (88.3 kg)   LMP  (LMP Unknown)   SpO2 96%   BMI 31.41 kg/m    Subjective:    Patient ID: Brandi Steele, female    DOB: 02/27/1955, 64 y.o.   MRN: [id number]  HPI: Brandi Steele is a 64 y.o. female  Chief Complaint  Patient presents with  . Hypertension  . IGF  . Urinary Tract Infection    pt states woke up today with urinary frequency   . Medication Refill    pt states she needs refill on xanax    URINARY SYMPTOMS Woke-up this morning with excruciating UTI symptoms. Dysuria: burning Urinary frequency: yes Urgency: yes Small volume voids: yes Symptom severity: yes Urinary incontinence: no Foul odor: no Hematuria: no Abdominal pain: no Back pain: no Suprapubic pain/pressure: no Flank pain: no Fever:  no Vomiting: no Relief with cranberry juice: none taken Relief with pyridium: none taken Status: stable Previous urinary tract infection: yes Recurrent urinary tract infection: couple years Sexual activity: No sexually active/monogomous/practicing safe sex History of sexually transmitted disease: no Treatments attempted: increasing fluids   HYPERTENSION / HYPERLIPIDEMIA/HF Continues on Lisinopril, Crestor -- was taken off Lasix due to low K+ and low Mag level (now only takes as needed if edema -- has not taken in 2 years).  Has history of elevation in A1C with last one in October 2020 -- 5.6%.  Last saw cardiology 05/19/19.  Last echo in March 2018 == 55% EF. Satisfied with current treatment? yes Duration of hypertension: chronic BP monitoring frequency: a few times a month BP range: 110-120/70 range BP medication side effects: no Duration of hyperlipidemia: chronic Cholesterol medication side effects: no Cholesterol supplements: none Medication compliance: good compliance Aspirin: no Recent stressors: no Recurrent headaches: no Visual changes: no Palpitations: no Dyspnea: no Chest pain:  no Lower extremity edema: no Dizzy/lightheaded: no   COPD Continues on Symbicort and Albuterol.  Quit smoker 3-4 years ago, only vapes occasionally. COPD status: stable Satisfied with current treatment?: yes Oxygen use: no Dyspnea frequency:  Cough frequency:  Rescue inhaler frequency:   Limitation of activity: no Productive cough:  Last Spirometry:  Pneumovax: Up to Date Influenza: refuses   ANXIETY/STRESS Continues on Xanax as needed 0.5 MG BID.  Last fill on 11/17/18, prior to this last fill 05/06/18 both for 60 tablets.  Pt is  aware of risks of benzo medication use to include increased sedation, respiratory suppression, falls, dependence and cardiovascular events.  Pt would like to continue treatment as benefit determined to outweigh risk.  Has been to therapist in past -- endorses benefit from Xanax.  Therapist tells her to use benzo to get through -- she very rarely uses it.   Duration:stable Anxious mood: yes  Excessive worrying: yes Irritability: no  Sweating: no Nausea: no Palpitations:no Hyperventilation: no Panic attacks: yes Agoraphobia: no  Obscessions/compulsions: no Depressed mood: no Depression screen [facility] 2/9 10/17/2019 09/07/2019 05/02/2019 11/17/2018 09/01/2018  Decreased Interest 0 0 0 0 0  Down, Depressed, Hopeless 0 0 0 1 2  PHQ - 2 Score 0 0 0 1 2  Altered sleeping 0 0 0 1 0  Tired, decreased energy 0 0 1 1 1   Change in appetite 0 0 0 0 1  Feeling bad or failure about yourself  0 0 0 0 1  Trouble concentrating 0 0 0 1 0  Moving  slowly or fidgety/restless 0 0 0 0 0  Suicidal thoughts 0 0 0 0 0  PHQ-9 Score 0 0 1 4 5   Some recent data might be hidden   Anhedonia: no Weight changes: no Insomnia: yes hard to fall asleep  Hypersomnia: no Fatigue/loss of energy: no Feelings of worthlessness: yes Feelings of guilt: yes Impaired concentration/indecisiveness: yes Suicidal ideations: no  Crying spells: yes Recent Stressors/Life Changes: yes   Relationship  problems: no   Family stress: yes     Financial stress: no    Job stress: no    Recent death/loss: no GAD 7 : Generalized Anxiety Score 09/07/2019 05/02/2019 09/01/2018 10/23/2017  Nervous, Anxious, on Edge 0 0 0 2  Control/stop worrying 0 0 0 2  Worry too much - different things 0 0 0 2  Trouble relaxing 0 0 0 2  Restless 0 0 0 0  Easily annoyed or irritable 0 0 2 0  Afraid - awful might happen 0 0 0 2  Total GAD 7 Score 0 0 2 10   Relevant past medical, surgical, family and social history reviewed and updated as indicated. Interim medical history since our last visit reviewed. Allergies and medications reviewed and updated.  Review of Systems  Constitutional: Negative for activity change, appetite change, diaphoresis, fatigue and fever.  Respiratory: Negative for cough, chest tightness and shortness of breath.   Cardiovascular: Negative for chest pain, palpitations and leg swelling.  Gastrointestinal: Negative.   Endocrine: Negative for cold intolerance, heat intolerance, polydipsia, polyphagia and polyuria.  Neurological: Negative.   Psychiatric/Behavioral: Negative.     Per HPI unless specifically indicated above     Objective:    BP 124/80   Pulse 63   Temp 97.9 F (36.6 C)   Wt 194 lb 9.6 oz (88.3 kg)   LMP  (LMP Unknown)   SpO2 96%   BMI 31.41 kg/m   Wt Readings from Last 3 Encounters:  12/08/19 194 lb 9.6 oz (88.3 kg)  10/17/19 198 lb (89.8 kg)  09/16/19 199 lb (90.3 kg)    Physical Exam Vitals and nursing note reviewed.  Constitutional:      General: She is awake. She is not in acute distress.    Appearance: She is well-developed and well-groomed. She is obese. She is not ill-appearing.  HENT:     Head: Normocephalic.     Right Ear: Hearing normal.     Left Ear: Hearing normal.     Nose: Nose normal.     Mouth/Throat:     Mouth: Mucous membranes are moist.  Eyes:     General: Lids are normal.        Right eye: No discharge.        Left eye: No  discharge.     Conjunctiva/sclera: Conjunctivae normal.     Pupils: Pupils are equal, round, and reactive to light.  Neck:     Thyroid: No thyromegaly.     Vascular: No carotid bruit or JVD.  Cardiovascular:     Rate and Rhythm: Normal rate and regular rhythm.     Heart sounds: Normal heart sounds. No murmur heard.  No gallop.   Pulmonary:     Effort: Pulmonary effort is normal.     Breath sounds: Normal breath sounds.  Abdominal:     General: Bowel sounds are normal. There is no distension.     Palpations: Abdomen is soft. There is no hepatomegaly.     Tenderness: There is no abdominal  tenderness. There is no right CVA tenderness or left CVA tenderness.  Musculoskeletal:     Cervical back: Normal range of motion and neck supple.     Right lower leg: No edema.     Left lower leg: No edema.  Lymphadenopathy:     Cervical: No cervical adenopathy.  Skin:    General: Skin is warm and dry.  Neurological:     Mental Status: She is alert and oriented to person, place, and time.  Psychiatric:        Attention and Perception: Attention normal.        Mood and Affect: Mood normal.        Behavior: Behavior normal. Behavior is cooperative.        Thought Content: Thought content normal.        Judgment: Judgment normal.     Results for orders placed or performed in visit on 12/08/19  WET PREP FOR [place], YEAST, CLUE   Specimen: Sterile Swab   Sterile Swab  Result Value Ref Range   Trichomonas Exam Negative Negative   Yeast Exam Negative Negative   Clue Cell Exam Negative Negative  Microscopic Examination   Urine  Result Value Ref Range   WBC, UA 0-5 0 - 5 /hpf   RBC None seen 0 - 2 /hpf   Epithelial Cells (non renal) 0-10 0 - 10 /hpf   Bacteria, UA None seen None seen/Few  Bayer DCA Hb A1c Waived  Result Value Ref Range   HB A1C (BAYER DCA - WAIVED) 5.5 <7.0 %  Microalbumin, Urine Waived  Result Value Ref Range   Microalb, Ur Waived 30 (H) 0 - 19 mg/L   Creatinine, Urine  Waived 50 10 - 300 mg/dL   Microalb/Creat Ratio 30-300 (H) <30 mg/g  Urinalysis, Routine w reflex microscopic  Result Value Ref Range   Specific Gravity, UA 1.015 1.005 - 1.030   pH, UA 8.5 (H) 5.0 - 7.5   Color, UA Yellow Yellow   Appearance Ur Clear Clear   Leukocytes,UA Trace (A) Negative   Protein,UA Negative Negative/Trace   Glucose, UA Negative Negative   Ketones, UA Negative Negative   RBC, UA Negative Negative   Bilirubin, UA Negative Negative   Urobilinogen, Ur 0.2 0.2 - 1.0 mg/dL   Nitrite, UA Negative Negative   Microscopic Examination See below:       Assessment & Plan:   Problem List Items Addressed This Visit      Cardiovascular and Mediastinum   CHF (congestive heart failure) (HCC)    Chronic, euvolemic today.  Continue collaboration with cardiology.  Her last echo was in March 2018 with EF 55%.  Continue current medication regimen and adjust as needed.  Recommend: - Reminded to call for an overnight weight gain of >2 pounds or a weekly weight weight of >5 pounds - not adding salt to his food and has been reading food labels. Reviewed the importance of keeping daily sodium intake to 2000mg  daily       Essential hypertension    Chronic, ongoing.  BP at goal in office today.  Continue current medication regimen and adjust as needed.  BMP and TSH today.  Recommend she monitor BP at few days a week at home and document for provider + focus on DASH diet.  Continue collaboration with cardiology.  Return to office in 6 months to meet new PCP.      Relevant Orders   Basic metabolic panel   TSH  CAD (coronary artery disease)    Chronic, ongoing.  Continue statin and ASA daily.  Will continue collaboration with cardiology.      Aortic atherosclerosis ([place])    Found on imaging CT 01/2017.  Recommend she continue taking statin and ASA daily for prevention.  Continue complete cessation of smoking.        Respiratory   Centrilobular emphysema (HCC) - Primary     Chronic, ongoing.  Continue current inhaler regimen and adjust as needed.  Recommend she continue to attend yearly lung CT screening for prevention.  Will plan on spirometry next visit.  Recommend cessation of vaping.  Return in 6 months to meet new PCP.        Endocrine   IFG (impaired fasting glucose)    Ongoing, stable with A1C today 5.5%.       Relevant Orders   Bayer DCA Hb A1c Waived (Completed)   Microalbumin, Urine Waived (Completed)     Other   Obesity    Recommended eating smaller high protein, low fat meals more frequently and exercising 30 mins a day 5 times a week with a goal of 10-15lb weight loss in the next 3 months. Patient voiced their understanding and motivation to adhere to these recommendations.       Hyperlipidemia    Chronic, ongoing.  Continue current medication regimen and adjust as needed.  Lipid panel today.         Relevant Orders   Lipid Panel w/o Chol/HDL Ratio   Personal history of tobacco use, presenting hazards to health    Recommend continued cessation of cigarette use and complete cessation of vaping.  Continue yearly lung CT screening.      Panic disorder    Chronic, ongoing.  Continue current medication regimen, she is aware of risks with chronic benzo use, but uses appropriately with minimal use.  Last refill was one year ago, will refill today.  Denies SI/HI.  Plan on UDS and controlled substance contract next visit.  Return in 6 months to meet new PCP.      Relevant Medications   ALPRAZolam (XANAX) 0.5 MG tablet    Other Visit Diagnoses    Pyuria       UA with 1+ LEUK and wet prep negative.  Will send urine for culture and treat as needed with abx therapy if positive culture.  Return for ongoing symptoms.   Relevant Orders   Urinalysis, Routine w reflex microscopic (Completed)   WET PREP FOR TRICH, YEAST, CLUE (Completed)   Urine Culture       Follow up plan: Return in about 6 months (around 06/06/2020) for HTN/HLD, anxiety, COPD  -- needs spirometry -- with new PCP in office.

## 2019-12-08 NOTE — Assessment & Plan Note (Signed)
Chronic, euvolemic today.  Continue collaboration with cardiology.  Her last echo was in March 2018 with EF 55%.  Continue current medication regimen and adjust as needed.  Recommend: - Reminded to call for an overnight weight gain of >2 pounds or a weekly weight weight of >5 pounds - not adding salt to his food and has been reading food labels. Reviewed the importance of keeping daily sodium intake to 2000mg  daily

## 2019-12-08 NOTE — Assessment & Plan Note (Signed)
Chronic, ongoing.  Continue current medication regimen, she is aware of risks with chronic benzo use, but uses appropriately with minimal use.  Last refill was one year ago, will refill today.  Denies SI/HI.  Plan on UDS and controlled substance contract next visit.  Return in 6 months to meet new PCP.

## 2019-12-08 NOTE — Assessment & Plan Note (Signed)
Found on imaging CT 01/2017.  Recommend she continue taking statin and ASA daily for prevention.  Continue complete cessation of smoking.

## 2019-12-08 NOTE — Assessment & Plan Note (Signed)
Ongoing, stable with A1C today 5.5%.

## 2019-12-08 NOTE — Assessment & Plan Note (Signed)
Recommend continued cessation of cigarette use and complete cessation of vaping.  Continue yearly lung CT screening.

## 2019-12-08 NOTE — Assessment & Plan Note (Signed)
Chronic, ongoing.  BP at goal in office today.  Continue current medication regimen and adjust as needed.  BMP and TSH today.  Recommend she monitor BP at few days a week at home and document for provider + focus on DASH diet.  Continue collaboration with cardiology.  Return to office in 6 months to meet new PCP.

## 2019-12-08 NOTE — Patient Instructions (Signed)
Healthy Eating Following a healthy eating pattern may help you to achieve and maintain a healthy body weight, reduce the risk of chronic disease, and live a long and productive life. It is important to follow a healthy eating pattern at an appropriate calorie level for your body. Your nutritional needs should be met primarily through food by choosing a variety of nutrient-rich foods. What are tips for following this plan? Reading food labels  Read labels and choose the following: ? Reduced or low sodium. ? Juices with 100% fruit juice. ? Foods with low saturated fats and high polyunsaturated and monounsaturated fats. ? Foods with whole grains, such as whole wheat, cracked wheat, brown rice, and wild rice. ? Whole grains that are fortified with folic acid. This is recommended for women who are pregnant or who want to become pregnant.  Read labels and avoid the following: ? Foods with a lot of added sugars. These include foods that contain brown sugar, corn sweetener, corn syrup, dextrose, fructose, glucose, high-fructose corn syrup, honey, invert sugar, lactose, malt syrup, maltose, molasses, raw sugar, sucrose, trehalose, or turbinado sugar.  Do not eat more than the following amounts of added sugar per day:  6 teaspoons (25 g) for women.  9 teaspoons (38 g) for men. ? Foods that contain processed or refined starches and grains. ? Refined grain products, such as white flour, degermed cornmeal, white bread, and white rice. Shopping  Choose nutrient-rich snacks, such as vegetables, whole fruits, and nuts. Avoid high-calorie and high-sugar snacks, such as potato chips, fruit snacks, and candy.  Use oil-based dressings and spreads on foods instead of solid fats such as butter, stick margarine, or cream cheese.  Limit pre-made sauces, mixes, and "instant" products such as flavored rice, instant noodles, and ready-made pasta.  Try more plant-protein sources, such as tofu, tempeh, black beans,  edamame, lentils, nuts, and seeds.  Explore eating plans such as the Mediterranean diet or vegetarian diet. Cooking  Use oil to saut or stir-fry foods instead of solid fats such as butter, stick margarine, or lard.  Try baking, boiling, grilling, or broiling instead of frying.  Remove the fatty part of meats before cooking.  Steam vegetables in water or broth. Meal planning   At meals, imagine dividing your plate into fourths: ? One-half of your plate is fruits and vegetables. ? One-fourth of your plate is whole grains. ? One-fourth of your plate is protein, especially lean meats, poultry, eggs, tofu, beans, or nuts.  Include low-fat dairy as part of your daily diet. Lifestyle  Choose healthy options in all settings, including home, work, school, restaurants, or stores.  Prepare your food safely: ? Wash your hands after handling raw meats. ? Keep food preparation surfaces clean by regularly washing with hot, soapy water. ? Keep raw meats separate from ready-to-eat foods, such as fruits and vegetables. ? Cook seafood, meat, poultry, and eggs to the recommended internal temperature. ? Store foods at safe temperatures. In general:  Keep cold foods at 59F (4.4C) or below.  Keep hot foods at 159F (60C) or above.  Keep your freezer at [facility] (-17.8C) or below.  Foods are no longer safe to eat when they have been between the temperatures of 40-159F (4.4-60C) for more than 2 hours. What foods should I eat? Fruits Aim to eat 2 cup-equivalents of fresh, canned (in natural juice), or frozen fruits each day. Examples of 1 cup-equivalent of fruit include 1 small apple, 8 large strawberries, 1 cup canned fruit,  cup  dried fruit, or 1 cup 100% juice. Vegetables Aim to eat 2-3 cup-equivalents of fresh and frozen vegetables each day, including different varieties and colors. Examples of 1 cup-equivalent of vegetables include 2 medium carrots, 2 cups raw, leafy greens, 1 cup chopped  vegetable (raw or cooked), or 1 medium baked potato. Grains Aim to eat 6 ounce-equivalents of whole grains each day. Examples of 1 ounce-equivalent of grains include 1 slice of bread, 1 cup ready-to-eat cereal, 3 cups popcorn, or  cup cooked rice, pasta, or cereal. Meats and other proteins Aim to eat 5-6 ounce-equivalents of protein each day. Examples of 1 ounce-equivalent of protein include 1 egg, 1/2 cup nuts or seeds, or 1 tablespoon (16 g) peanut butter. A cut of meat or fish that is the size of a deck of cards is about 3-4 ounce-equivalents.  Of the protein you eat each week, try to have at least 8 ounces come from seafood. This includes salmon, trout, herring, and anchovies. Dairy Aim to eat 3 cup-equivalents of fat-free or low-fat dairy each day. Examples of 1 cup-equivalent of dairy include 1 cup (240 mL) milk, 8 ounces (250 g) yogurt, 1 ounces (44 g) natural cheese, or 1 cup (240 mL) fortified soy milk. Fats and oils  Aim for about 5 teaspoons (21 g) per day. Choose monounsaturated fats, such as canola and olive oils, avocados, peanut butter, and most nuts, or polyunsaturated fats, such as sunflower, corn, and soybean oils, walnuts, pine nuts, sesame seeds, sunflower seeds, and flaxseed. Beverages  Aim for six 8-oz glasses of water per day. Limit coffee to three to five 8-oz cups per day.  Limit caffeinated beverages that have added calories, such as soda and energy drinks.  Limit alcohol intake to no more than 1 drink a day for nonpregnant women and 2 drinks a day for men. One drink equals 12 oz of beer (355 mL), 5 oz of wine (148 mL), or 1 oz of hard liquor (44 mL). Seasoning and other foods  Avoid adding excess amounts of salt to your foods. Try flavoring foods with herbs and spices instead of salt.  Avoid adding sugar to foods.  Try using oil-based dressings, sauces, and spreads instead of solid fats. This information is based on general [place] nutrition guidelines. For more  information, visit Build[employer].es. Exact amounts may vary based on your nutrition needs. Summary  A healthy eating plan may help you to maintain a healthy weight, reduce the risk of chronic diseases, and stay active throughout your life.  Plan your meals. Make sure you eat the right portions of a variety of nutrient-rich foods.  Try baking, boiling, grilling, or broiling instead of frying.  Choose healthy options in all settings, including home, work, school, restaurants, or stores. This information is not intended to replace advice given to you by your health care provider. Make sure you discuss any questions you have with your health care provider. Document Revised: 04/27/2017 Document Reviewed: 04/27/2017 Elsevier Patient Education  [place].

## 2019-12-08 NOTE — Assessment & Plan Note (Signed)
Chronic, ongoing.  Continue current medication regimen and adjust as needed. Lipid panel today. 

## 2019-12-09 LAB — BASIC METABOLIC PANEL
BUN/Creatinine Ratio: 24 (ref 12–28)
BUN: 16 mg/dL (ref 8–27)
CO2: 21 mmol/L (ref 20–29)
Calcium: 9.6 mg/dL (ref 8.7–10.3)
Chloride: 104 mmol/L (ref 96–106)
Creatinine, Ser: 0.67 mg/dL (ref 0.57–1.00)
GFR calc Af Amer: 106 mL/min/{1.73_m2} (ref >59)
GFR calc non Af Amer: 92 mL/min/{1.73_m2} (ref >59)
Glucose: 91 mg/dL (ref 65–99)
Potassium: 4.7 mmol/L (ref 3.5–5.2)
Sodium: 144 mmol/L (ref 134–144)

## 2019-12-09 LAB — LIPID PANEL W/O CHOL/HDL RATIO
Cholesterol, Total: 151 mg/dL (ref 100–199)
HDL: 37 mg/dL — ABNORMAL LOW (ref >39)
LDL Chol Calc (NIH): 93 mg/dL (ref 0–99)
Triglycerides: 114 mg/dL (ref 0–149)
VLDL Cholesterol Cal: 21 mg/dL (ref 5–40)

## 2019-12-09 LAB — TSH: TSH: 1.46 u[IU]/mL (ref 0.450–4.500)

## 2019-12-09 NOTE — Progress Notes (Signed)
Contacted via MyChart  Good evening Brandi Steele, your labs have returned.  Overall they look good.  Kidney function is stable.  Thyroid normal.  Cholesterol levels stable.  Continue all current medications.  Any questions? Keep being awesome!!  Thank you for allowing me to participate in your care. Kindest regards, Aiyonna Lucado

## 2019-12-11 LAB — URINE CULTURE

## 2019-12-11 NOTE — Progress Notes (Signed)
Contacted via [place] morning Jiayi, your urine came back showing no growth or infection.  No need for antibiotic.  I see you are still having urgency issues, this may be related more to an overactive bladder issue vs infection by looks of it.  One thing I recommend is to start doing Kegel exercises, this can help strengthen the muscles and improve the symptoms of urgency.  There is lots of information online on how to perform these.  I also recommend ensuring to cut back on fluids at least a couple hours before bedtime.  Your diabetes is under good control so do not suspect it is coming from this.  If ongoing issues we may need to consider adding on a safe medication for your age, like Myrbetriq.  Here is some good information to look up on bladder training and keeping bladder journal: https://www.villegas.net/ Any questions? Keep being awesome!!  Thank you for allowing me to participate in your care. Kindest regards, Devita Nies

## 2020-01-02 ENCOUNTER — Other Ambulatory Visit: Payer: Self-pay

## 2020-01-02 ENCOUNTER — Ambulatory Visit (INDEPENDENT_AMBULATORY_CARE_PROVIDER_SITE_OTHER): Payer: Medicare Other

## 2020-01-02 ENCOUNTER — Encounter (INDEPENDENT_AMBULATORY_CARE_PROVIDER_SITE_OTHER): Payer: Self-pay | Admitting: Vascular Surgery

## 2020-01-02 ENCOUNTER — Ambulatory Visit (INDEPENDENT_AMBULATORY_CARE_PROVIDER_SITE_OTHER): Payer: Medicare Other | Admitting: Vascular Surgery

## 2020-01-02 VITALS — BP 120/75 | HR 77 | Resp 16 | Wt 196.0 lb

## 2020-01-02 DIAGNOSIS — I700 Atherosclerosis of aorta: Secondary | ICD-10-CM | POA: Diagnosis not present

## 2020-01-02 DIAGNOSIS — I714 Abdominal aortic aneurysm, without rupture, unspecified: Secondary | ICD-10-CM

## 2020-01-02 DIAGNOSIS — I6523 Occlusion and stenosis of bilateral carotid arteries: Secondary | ICD-10-CM

## 2020-01-02 DIAGNOSIS — I2510 Atherosclerotic heart disease of native coronary artery without angina pectoris: Secondary | ICD-10-CM

## 2020-01-02 DIAGNOSIS — I10 Essential (primary) hypertension: Secondary | ICD-10-CM

## 2020-01-02 DIAGNOSIS — E782 Mixed hyperlipidemia: Secondary | ICD-10-CM

## 2020-01-02 NOTE — Progress Notes (Signed)
MRN : [id number]  Brandi Steele is a 64 y.o. (31-Dec-1955) female who presents with chief complaint of 1 year follow up.  History of Present Illness:   The patient returns to the office for surveillance of a known abdominal aortic aneurysm. Patient denies abdominal pain or back pain, no other abdominal complaints. No changes suggesting embolic episodes.   There have been no interval changes in the patient's overall health care since his last visit.  Patient denies amaurosis fugax or TIA symptoms.   There is no history of claudication or rest pain symptoms of the lower extremities. The patient denies angina or shortness of breath.   Duplex US of the aorta and iliac arteries shows an AAA measured about 2.8 cm.  No change compared to previous study.  Carotid duplex shows <40% bilateral carotid stenosis.  No outpatient medications have been marked as taking for the 01/02/20 encounter (Appointment) with Delana Meyer, Dolores Lory, MD.    Past Medical History:  Diagnosis Date  . Anxiety   . Aortic stenosis   . CHF (congestive heart failure) ([place])   . COPD (chronic obstructive pulmonary disease) ([place])   . Depression   . Fibromyalgia   . GERD (gastroesophageal reflux disease)   . Hyperlipidemia   . Hypertension   . Hypertensive heart disease   . Myalgia   . Obesity   . Sciatica   . Seborrheic keratosis   . Stress incontinence     Past Surgical History:  Procedure Laterality Date  . ABDOMINAL HYSTERECTOMY     partial    Social History Social History   Tobacco Use  . Smoking status: Former Smoker    Packs/day: 1.00    Years: 40.00    Pack years: 40.00    Types: Cigarettes    Quit date: 11/01/2016    Years since quitting: 3.1  . Smokeless tobacco: Never Used  . Tobacco comment: Is trying cold-turkey with social support  Vaping Use  . Vaping Use: Every day  Substance Use Topics  . Alcohol use: No    Alcohol/week: 0.0 standard drinks  . Drug use: No    Family  History Family History  Problem Relation Age of Onset  . Stroke Mother   . Hypertension Mother   . Heart disease Father   . Diabetes Brother   . Stroke Maternal Grandmother   . Stroke Maternal Grandfather   . Heart disease Paternal Grandmother   . Heart disease Paternal Grandfather   . Breast cancer Neg Hx     Allergies  Allergen Reactions  . Morphine And Related Nausea And Vomiting  . Celexa [Citalopram Hydrobromide] Other (See Comments)    Pruritis   . Effexor [Venlafaxine] Other (See Comments)    Panic attack  . Lipitor [Atorvastatin] Other (See Comments)    Myalgias  . Shellfish Allergy Hives and Swelling  . Wellbutrin [Bupropion] Anxiety     REVIEW OF SYSTEMS (Negative unless checked)  Constitutional: [] Weight loss  [] Fever  [] Chills Cardiac: [] Chest pain   [] Chest pressure   [] Palpitations   [] Shortness of breath when laying flat   [] Shortness of breath with exertion. Vascular:  [] Pain in legs with walking   [] Pain in legs at rest  [] History of DVT   [] Phlebitis   [] Swelling in legs   [] Varicose veins   [] Non-healing ulcers Pulmonary:   [] Uses home oxygen   [] Productive cough   [] Hemoptysis   [] Wheeze  [] COPD   [] Asthma Neurologic:  [] Dizziness   [] Seizures   []   History of stroke   [] History of TIA  [] Aphasia   [] Vissual changes   [] Weakness or numbness in arm   [] Weakness or numbness in leg Musculoskeletal:   [] Joint swelling   [] Joint pain   [] Low back pain Hematologic:  [] Easy bruising  [] Easy bleeding   [] Hypercoagulable state   [] Anemic Gastrointestinal:  [] Diarrhea   [] Vomiting  [] Gastroesophageal reflux/heartburn   [] Difficulty swallowing. Genitourinary:  [] Chronic kidney disease   [] Difficult urination  [] Frequent urination   [] Blood in urine Skin:  [] Rashes   [] Ulcers  Psychological:  [] History of anxiety   []  History of major depression.  Physical Examination  There were no vitals filed for this visit. There is no height or weight on file to calculate  BMI. Gen: WD/WN, NAD Head: [place]/AT, No temporalis wasting.  Ear/Nose/Throat: Hearing grossly intact, nares w/o erythema or drainage Eyes: PER, EOMI, sclera nonicteric.  Neck: Supple, no large masses.   Pulmonary:  Good air movement, no audible wheezing bilaterally, no use of accessory muscles.  Cardiac: RRR, no JVD Vascular:  Vessel Right Left  Radial Palpable Palpable  Carotid Palpable Palpable  Gastrointestinal: Non-distended. No guarding/no peritoneal signs.  Musculoskeletal: M/S 5/5 throughout.  No deformity or atrophy.  Neurologic: CN 2-12 intact. Symmetrical.  Speech is fluent. Motor exam as listed above. Psychiatric: Judgment intact, Mood & affect appropriate for pt's clinical situation. Dermatologic: No rashes or ulcers noted.  No changes consistent with cellulitis.   CBC Lab Results  Component Value Date   WBC 4.6 09/01/2018   HGB 13.3 09/01/2018   HCT 40.4 09/01/2018   MCV 87 09/01/2018   PLT 174 09/01/2018    BMET    Component Value Date/Time   NA 144 12/08/2019 1020   NA 143 06/16/2013 1946   K 4.7 12/08/2019 1020   K 4.0 06/16/2013 1946   CL 104 12/08/2019 1020   CL 103 06/16/2013 1946   CO2 21 12/08/2019 1020   CO2 31 06/16/2013 1946   GLUCOSE 91 12/08/2019 1020   GLUCOSE 100 (H) 05/22/2015 1209   GLUCOSE 93 06/16/2013 1946   BUN 16 12/08/2019 1020   BUN 13 06/16/2013 1946   CREATININE 0.67 12/08/2019 1020   CREATININE 0.63 06/16/2013 1946   CALCIUM 9.6 12/08/2019 1020   CALCIUM 9.0 06/16/2013 1946   GFRNONAA 92 12/08/2019 1020   GFRNONAA >60 06/16/2013 1946   GFRAA 106 12/08/2019 1020   GFRAA >60 06/16/2013 1946   CrCl cannot be calculated (Patient's most recent lab result is older than the maximum 21 days allowed.).  COAG No results found for: INR, PROTIME  Radiology No results found.   Assessment/Plan 1. Abdominal aortic aneurysm (AAA) without rupture ([place]) No surgery or intervention at this time. The patient has an asymptomatic  abdominal aortic aneurysm that is less than 3 cm in maximal diameter.  I have discussed the natural history of abdominal aortic aneurysm and the small risk of rupture for aneurysm less than 5 cm in size.  However, as these small aneurysms tend to enlarge over time, continued surveillance with ultrasound or CT scan is mandatory.  I have also discussed optimizing medical management with hypertension and lipid control and the importance of abstinence from tobacco.  The patient is also encouraged to exercise a minimum of 30 minutes 4 times a week.  Should the patient develop new onset abdominal or back pain or signs of peripheral embolization they are instructed to seek medical attention immediately and to alert the physician providing care that  they have an aneurysm.  The patient voices their understanding. The patient will return in 24 months with an aortic duplex.  - VAS US AORTA/IVC/ILIACS; Future  2. Bilateral carotid artery stenosis Recommend:  Given the patient's asymptomatic subcritical stenosis no further invasive testing or surgery at this time.  Duplex ultrasound shows <40% stenosis bilaterally.  Continue antiplatelet therapy as prescribed Continue management of CAD, HTN and Hyperlipidemia Healthy heart diet,  encouraged exercise at least 4 times per week Follow up in 24 months with duplex ultrasound and physical exam   - VAS US CAROTID; Future  3. Aortic atherosclerosis (HCC) Recommend:  I do not find evidence of life style limiting vascular disease. The patient specifically denies life style limitation.  Previous noninvasive studies including ABI's of the legs do not identify critical vascular problems.  The patient should continue walking and begin a more formal exercise program. The patient should continue his antiplatelet therapy and aggressive treatment of the lipid abnormalities.   4. Coronary artery disease involving native heart without angina pectoris, unspecified  vessel or lesion type Continue cardiac and antihypertensive medications as already ordered and reviewed, no changes at this time.  Continue statin as ordered and reviewed, no changes at this time  Nitrates PRN for chest pain   5. Essential hypertension Continue antihypertensive medications as already ordered, these medications have been reviewed and there are no changes at this time.   6. Mixed hyperlipidemia Continue statin as ordered and reviewed, no changes at this time   Hortencia Pilar, MD  01/02/2020 8:20 AM

## 2020-01-03 ENCOUNTER — Encounter (INDEPENDENT_AMBULATORY_CARE_PROVIDER_SITE_OTHER): Payer: Self-pay | Admitting: Vascular Surgery

## 2020-01-16 ENCOUNTER — Telehealth: Payer: Self-pay | Admitting: Family Medicine

## 2020-01-16 NOTE — Telephone Encounter (Signed)
Alwyn Ren office manager spoke with Jannifer Hick and has resolved this

## 2020-01-16 NOTE — Telephone Encounter (Signed)
Kizzy calling from [facility] walk is calling for [employer]. 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 And need to get to covid tested because the husband has covd. Patient is having symptoms. Is it ok to see the patient. Please advise [phone]

## 2020-03-01 ENCOUNTER — Telehealth: Payer: Self-pay | Admitting: *Deleted

## 2020-03-01 NOTE — Telephone Encounter (Signed)
Attempted to contact and schedule lung screening scan. Message left for patient to call back to schedule. 

## 2020-03-05 ENCOUNTER — Other Ambulatory Visit: Payer: Self-pay | Admitting: Family Medicine

## 2020-03-05 ENCOUNTER — Other Ambulatory Visit: Payer: Self-pay | Admitting: Nurse Practitioner

## 2020-03-07 ENCOUNTER — Other Ambulatory Visit: Payer: Self-pay | Admitting: *Deleted

## 2020-03-07 ENCOUNTER — Encounter: Payer: Self-pay | Admitting: Nurse Practitioner

## 2020-03-07 ENCOUNTER — Ambulatory Visit (INDEPENDENT_AMBULATORY_CARE_PROVIDER_SITE_OTHER): Payer: Medicare Other | Admitting: Nurse Practitioner

## 2020-03-07 ENCOUNTER — Other Ambulatory Visit: Payer: Self-pay

## 2020-03-07 VITALS — BP 117/65 | HR 62 | Temp 98.3°F | Wt 192.2 lb

## 2020-03-07 DIAGNOSIS — I10 Essential (primary) hypertension: Secondary | ICD-10-CM | POA: Diagnosis not present

## 2020-03-07 DIAGNOSIS — I700 Atherosclerosis of aorta: Secondary | ICD-10-CM | POA: Diagnosis not present

## 2020-03-07 DIAGNOSIS — D229 Melanocytic nevi, unspecified: Secondary | ICD-10-CM | POA: Diagnosis not present

## 2020-03-07 DIAGNOSIS — Z122 Encounter for screening for malignant neoplasm of respiratory organs: Secondary | ICD-10-CM

## 2020-03-07 DIAGNOSIS — Z87891 Personal history of nicotine dependence: Secondary | ICD-10-CM

## 2020-03-07 MED ORDER — ROSUVASTATIN CALCIUM 10 MG PO TABS: 10.0000 mg | ORAL_TABLET | Freq: Every day | ORAL | 1 refills | Status: DC

## 2020-03-07 MED ORDER — CLOPIDOGREL BISULFATE 75 MG PO TABS: 75.0000 mg | ORAL_TABLET | Freq: Every day | ORAL | 1 refills | Status: DC

## 2020-03-07 MED ORDER — ESOMEPRAZOLE MAGNESIUM 40 MG PO CPDR: DELAYED_RELEASE_CAPSULE | ORAL | 2 refills | Status: DC

## 2020-03-07 MED ORDER — LISINOPRIL 2.5 MG PO TABS: ORAL_TABLET | ORAL | 1 refills | Status: DC

## 2020-03-07 MED ORDER — MELOXICAM 15 MG PO TABS: ORAL_TABLET | ORAL | 3 refills | Status: DC

## 2020-03-07 NOTE — Progress Notes (Signed)
BP 117/65   Pulse 62   Temp 98.3 F (36.8 C) (Oral)   Wt 192 lb 3.2 oz (87.2 kg)   LMP  (LMP Unknown)   SpO2 96%   BMI 31.02 kg/m    Subjective:    Patient ID: Brandi Steele, female    DOB: 09-01-1955, 65 y.o.   MRN: [id number]  HPI: Brandi Steele is a 65 y.o. female  Chief Complaint  Patient presents with  . Referral    Pt states she would like a referral to dermatology to have a few places checked out, especially one on her face   Patient seen today with complaints of a spot on her face.  She had seen her PCP in the past who wanted her to see Dermatology to have the area looked at.  Patient has never had a full body check by Dermatology and interested in doing so.  Patient also needs refills on her medications.  She is due for regular follow up in May.   Relevant past medical, surgical, family and social history reviewed and updated as indicated. Interim medical history since our last visit reviewed. Allergies and medications reviewed and updated.  Review of Systems  Skin:       Dark spot on face.    Per HPI unless specifically indicated above     Objective:    BP 117/65   Pulse 62   Temp 98.3 F (36.8 C) (Oral)   Wt 192 lb 3.2 oz (87.2 kg)   LMP  (LMP Unknown)   SpO2 96%   BMI 31.02 kg/m   Wt Readings from Last 3 Encounters:  03/07/20 192 lb 3.2 oz (87.2 kg)  01/02/20 196 lb (88.9 kg)  12/08/19 194 lb 9.6 oz (88.3 kg)    Physical Exam Vitals and nursing note reviewed.  Constitutional:      General: She is not in acute distress.    Appearance: Normal appearance. She is normal weight. She is not ill-appearing, toxic-appearing or diaphoretic.  HENT:     Head: Normocephalic.     Right Ear: External ear normal.     Left Ear: External ear normal.     Nose: Nose normal.     Mouth/Throat:     Mouth: Mucous membranes are moist.     Pharynx: Oropharynx is clear.  Eyes:     General:        Right eye: No discharge.        Left eye: No discharge.      Extraocular Movements: Extraocular movements intact.     Conjunctiva/sclera: Conjunctivae normal.     Pupils: Pupils are equal, round, and reactive to light.  Cardiovascular:     Rate and Rhythm: Normal rate and regular rhythm.     Heart sounds: No murmur heard.   Pulmonary:     Effort: Pulmonary effort is normal. No respiratory distress.     Breath sounds: Normal breath sounds. No wheezing or rales.  Musculoskeletal:     Cervical back: Normal range of motion and neck supple.  Skin:    General: Skin is warm and dry.     Capillary Refill: Capillary refill takes less than 2 seconds.     Comments: Hyperpigmentation on right side of face along the jaw line.  Neurological:     General: No focal deficit present.     Mental Status: She is alert and oriented to person, place, and time. Mental status is at baseline.  Psychiatric:  Mood and Affect: Mood normal.        Behavior: Behavior normal.        Thought Content: Thought content normal.        Judgment: Judgment normal.     Results for orders placed or performed in visit on 12/08/19  WET PREP FOR [place], YEAST, CLUE   Specimen: Sterile Swab   Sterile Swab  Result Value Ref Range   Trichomonas Exam Negative Negative   Yeast Exam Negative Negative   Clue Cell Exam Negative Negative  Urine Culture   Specimen: Urine   UR  Result Value Ref Range   Urine Culture, Routine Final report    Organism ID, Bacteria Comment   Microscopic Examination   Urine  Result Value Ref Range   WBC, UA 0-5 0 - 5 /hpf   RBC None seen 0 - 2 /hpf   Epithelial Cells (non renal) 0-10 0 - 10 /hpf   Bacteria, UA None seen None seen/Few  Bayer DCA Hb A1c Waived  Result Value Ref Range   HB A1C (BAYER DCA - WAIVED) 5.5 <1.4 %  Basic metabolic panel  Result Value Ref Range   Glucose 91 65 - 99 mg/dL   BUN 16 8 - 27 mg/dL   Creatinine, Ser 0.67 0.57 - 1.00 mg/dL   GFR calc non Af Amer 92 >59 mL/min/1.73   GFR calc Af Amer 106 >59 mL/min/1.73    BUN/Creatinine Ratio 24 12 - 28   Sodium 144 134 - 144 mmol/L   Potassium 4.7 3.5 - 5.2 mmol/L   Chloride 104 96 - 106 mmol/L   CO2 21 20 - 29 mmol/L   Calcium 9.6 8.7 - 10.3 mg/dL  Microalbumin, Urine Waived  Result Value Ref Range   Microalb, Ur Waived 30 (H) 0 - 19 mg/L   Creatinine, Urine Waived 50 10 - 300 mg/dL   Microalb/Creat Ratio 30-300 (H) <30 mg/g  TSH  Result Value Ref Range   TSH 1.460 0.450 - 4.500 uIU/mL  Lipid Panel w/o Chol/HDL Ratio  Result Value Ref Range   Cholesterol, Total 151 100 - 199 mg/dL   Triglycerides 114 0 - 149 mg/dL   HDL 37 (L) >39 mg/dL   VLDL Cholesterol Cal 21 5 - 40 mg/dL   LDL Chol Calc (NIH) 93 0 - 99 mg/dL  Urinalysis, Routine w reflex microscopic  Result Value Ref Range   Specific Gravity, UA 1.015 1.005 - 1.030   pH, UA 8.5 (H) 5.0 - 7.5   Color, UA Yellow Yellow   Appearance Ur Clear Clear   Leukocytes,UA Trace (A) Negative   Protein,UA Negative Negative/Trace   Glucose, UA Negative Negative   Ketones, UA Negative Negative   RBC, UA Negative Negative   Bilirubin, UA Negative Negative   Urobilinogen, Ur 0.2 0.2 - 1.0 mg/dL   Nitrite, UA Negative Negative   Microscopic Examination See below:       Assessment & Plan:   Problem List Items Addressed This Visit      Cardiovascular and Mediastinum   Essential hypertension    Stable.  Refills given during visit.       Relevant Medications   lisinopril (ZESTRIL) 2.5 MG tablet   rosuvastatin (CRESTOR) 10 MG tablet   Aortic atherosclerosis (HCC)    Stable.  Refills given today.       Relevant Medications   lisinopril (ZESTRIL) 2.5 MG tablet   rosuvastatin (CRESTOR) 10 MG tablet    Other Visit Diagnoses  Atypical mole    -  Primary   Referral given to Dermatology.    Relevant Orders   Ambulatory referral to Dermatology       Follow up plan: Return in about 3 months (around 06/04/2020) for HTN/HLD, anxiety, COPD -- needs spirometry.     A total of 30 minutes were  spent on this encounter.  This includes before the visit, face to face with the patient and after the visit.

## 2020-03-07 NOTE — Progress Notes (Signed)
Contacted and scheduled for annual lung screening scan. Patient is a former smoker, quit 11/01/16, 40 pack year history.

## 2020-03-07 NOTE — Assessment & Plan Note (Signed)
Stable.  Refills given today.

## 2020-03-07 NOTE — Assessment & Plan Note (Signed)
Stable.  Refills given during visit.

## 2020-03-12 ENCOUNTER — Telehealth: Payer: Self-pay | Admitting: Nurse Practitioner

## 2020-03-12 NOTE — Telephone Encounter (Signed)
Pt is calling to request another referral to dermatology. Pt can not get an appt until 08/22. Please advise CB- [phone]

## 2020-03-13 NOTE — Telephone Encounter (Signed)
I agree that patient should not cancel her appointment until she has another one.  I spoke with the referral person who is working on helping the patient get another appointment.

## 2020-03-13 NOTE — Telephone Encounter (Signed)
I have sent this to [facility] dermatology. I will follow up on this to see if patient is able to get in soon.

## 2020-03-13 NOTE — Telephone Encounter (Signed)
I spoke with the referral person who is going to reach out to another Dermatologist to try and get her in sooner.  However, she did say that Dermatology is taking longer than usual.

## 2020-03-13 NOTE — Telephone Encounter (Signed)
Called pt to relay message no answer left vm

## 2020-03-13 NOTE — Telephone Encounter (Signed)
Spoke with patient and she states she has an appointment with Dermatology, but her appointment is not until August and she states she was told that she could try another office if they could get her in sooner. Patient states she didn't want to cancel her appointment until she is scheduled elsewhere.

## 2020-03-14 NOTE — Telephone Encounter (Signed)
Called pt advised of below message. Pt verbalized understanding

## 2020-03-22 ENCOUNTER — Ambulatory Visit
Admission: RE | Admit: 2020-03-22 | Discharge: 2020-03-22 | Disposition: A | Discharge status: Home / Self Care | Payer: Medicare Other | Source: Ambulatory Visit | Attending: Oncology | Admitting: Oncology

## 2020-03-22 ENCOUNTER — Other Ambulatory Visit: Payer: Self-pay

## 2020-03-22 DIAGNOSIS — Z122 Encounter for screening for malignant neoplasm of respiratory organs: Secondary | ICD-10-CM | POA: Insufficient documentation

## 2020-03-22 DIAGNOSIS — Z87891 Personal history of nicotine dependence: Secondary | ICD-10-CM | POA: Diagnosis present

## 2020-03-29 ENCOUNTER — Telehealth: Payer: Self-pay | Admitting: Nurse Practitioner

## 2020-03-29 ENCOUNTER — Encounter: Payer: Self-pay | Admitting: *Deleted

## 2020-03-29 DIAGNOSIS — I714 Abdominal aortic aneurysm, without rupture, unspecified: Secondary | ICD-10-CM

## 2020-03-29 NOTE — Telephone Encounter (Signed)
Called patient and left message for patient to call back.  Please let her know that the CT lung revealed that her abdominal aortic aneurysm is larger than it was in 2020.  It is recommend that the patient have a repeat US of the aneurysm.  I have already ordered this so she can get it scheduled.   It is okay to give this message to the patient.  If she has further questions we can do a video visit to discuss it further.

## 2020-03-29 NOTE — Telephone Encounter (Signed)
-----   Message from Lieutenant Diego, RN sent at 03/29/2020  1:13 PM EST ----- Please also note the recommendations related to the descending aorta. We will send interpretation of these results to the patient. We will also contact them close to the time their annual lung screening scan is due next year for scheduling.  Thank you, Raquel Sarna

## 2020-03-30 NOTE — Telephone Encounter (Signed)
Called patient to discuss message, no answer, left a voicemail for patient to return my call.

## 2020-03-30 NOTE — Telephone Encounter (Signed)
Patient states she has been seeing Vein and Vascular Dr.Schneir and patient says she was recently seen at in December 2021 and states she was told her that she had an aortic blockage and they did a procedure and cleared the blockage and stated the blockage was cleared 100%, but she wanted to let Dr.Holdsworth know that she was told she does not have a aneurysm, but it was scar tissue from the procedure.

## 2020-03-30 NOTE — Telephone Encounter (Signed)
Thank you for the update.  Can we request these records?

## 2020-03-30 NOTE — Telephone Encounter (Signed)
Routing back

## 2020-03-30 NOTE — Telephone Encounter (Signed)
AAA noted by Dr. Ronalee Belts on 16/06/2019.  Aware of it and monitoring the size.

## 2020-04-02 ENCOUNTER — Other Ambulatory Visit: Payer: Self-pay | Admitting: Family Medicine

## 2020-04-02 ENCOUNTER — Other Ambulatory Visit: Payer: Self-pay | Admitting: Nurse Practitioner

## 2020-05-04 ENCOUNTER — Other Ambulatory Visit: Payer: Self-pay | Admitting: Nurse Practitioner

## 2020-05-25 ENCOUNTER — Other Ambulatory Visit: Payer: Self-pay | Admitting: Nurse Practitioner

## 2020-05-25 NOTE — Telephone Encounter (Signed)
Pt scheduled 5/9

## 2020-05-25 NOTE — Telephone Encounter (Signed)
   Notes to clinic: Medication being requested is too early  Review to refill    Requested Prescriptions  Pending Prescriptions Disp Refills   albuterol (VENTOLIN HFA) 108 (90 Base) MCG/ACT inhaler [Pharmacy Med Name: ALBUTEROL HFA INH (200 PUFFS)8.5GM] 8.5 g 0    Sig: INHALE 2 PUFFS INTO THE LUNGS EVERY 6 HOURS AS NEEDED FOR WHEEZING OR SHORTNESS OF BREATH      Pulmonology:  Beta Agonists Failed - 05/25/2020 10:30 AM      Failed - One inhaler should last at least one month. If the patient is requesting refills earlier, contact the patient to check for uncontrolled symptoms.      Passed - Valid encounter within last 12 months    Recent Outpatient Visits           2 months ago Atypical mole   [place], NP   5 months ago Centrilobular emphysema [facility]   [place], Henrine Screws T, NP   8 months ago Inflamed skin tag   [place], [place]   8 months ago IFG (impaired fasting glucose)   [place], [place]   10 months ago Right wrist pain   [facility] [place], [place]       Future Appointments             In 1 week Jon Billings, NP [facility], [place]   In 3 months Ralene Bathe, MD [place]   In 4 months  [facility], [place]

## 2020-06-01 NOTE — Progress Notes (Signed)
BP 126/67   Pulse (!) 59   Temp 97.9 F (36.6 C)   Ht 5' 5.08" (1.653 m)   Wt 186 lb (84.4 kg)   LMP  (LMP Unknown)   SpO2 96%   BMI 30.88 kg/m    Subjective:    Patient ID: Brandi Steele, female    DOB: 04-Apr-1955, 65 y.o.   MRN: [id number]  HPI: Brandi Steele is a 65 y.o. female  No chief complaint on file.  CHF Patient see's Dr. Nehemiah Massed who manages her CHF.  Patient denies SOB, weight gain, or welling at visit today.  COPD Patient reports overall doing well.  Denies concerns at visit today.   HYPERTENSION / HYPERLIPIDEMIA Satisfied with current treatment? no Duration of hypertension: years BP monitoring frequency: daily BP range: 110-125/60-70 BP medication side effects: no Past BP meds: lisinopril Duration of hyperlipidemia: years Cholesterol medication side effects: no Cholesterol supplements: none Past cholesterol medications: rosuvastatin (crestor) Medication compliance: excellent compliance Aspirin: no Recent stressors: no Recurrent headaches: no Visual changes: no Palpitations: no Dyspnea: no Chest pain: no Lower extremity edema: no Dizzy/lightheaded: no  ANXIETY Patient has anxiety and PTSD.  She has seen a counselor who has helped her control her symptoms without medication.  However, she does use Xanax on a very rare occasion.  Denies SI.  Relevant past medical, surgical, family and social history reviewed and updated as indicated. Interim medical history since our last visit reviewed. Allergies and medications reviewed and updated.  Review of Systems  Eyes: Negative for visual disturbance.  Respiratory: Negative for cough, chest tightness and shortness of breath.   Cardiovascular: Negative for chest pain, palpitations and leg swelling.  Neurological: Negative for dizziness and headaches.  Psychiatric/Behavioral: Negative for dysphoric mood and suicidal ideas. The patient is nervous/anxious.     Per HPI unless specifically indicated  above     Objective:    BP 126/67   Pulse (!) 59   Temp 97.9 F (36.6 C)   Ht 5' 5.08" (1.653 m)   Wt 186 lb (84.4 kg)   LMP  (LMP Unknown)   SpO2 96%   BMI 30.88 kg/m   Wt Readings from Last 3 Encounters:  06/04/20 186 lb (84.4 kg)  03/22/20 189 lb (85.7 kg)  03/07/20 192 lb 3.2 oz (87.2 kg)    Physical Exam Vitals and nursing note reviewed.  Constitutional:      General: She is not in acute distress.    Appearance: Normal appearance. She is normal weight. She is not ill-appearing, toxic-appearing or diaphoretic.  HENT:     Head: Normocephalic.     Right Ear: External ear normal.     Left Ear: External ear normal.     Nose: Nose normal.     Mouth/Throat:     Mouth: Mucous membranes are moist.     Pharynx: Oropharynx is clear.  Eyes:     General:        Right eye: No discharge.        Left eye: No discharge.     Extraocular Movements: Extraocular movements intact.     Conjunctiva/sclera: Conjunctivae normal.     Pupils: Pupils are equal, round, and reactive to light.  Cardiovascular:     Rate and Rhythm: Normal rate and regular rhythm.     Heart sounds: No murmur heard.   Pulmonary:     Effort: Pulmonary effort is normal. No respiratory distress.     Breath sounds: Normal breath sounds.  No wheezing or rales.  Musculoskeletal:     Cervical back: Normal range of motion and neck supple.  Skin:    General: Skin is warm and dry.     Capillary Refill: Capillary refill takes less than 2 seconds.  Neurological:     General: No focal deficit present.     Mental Status: She is alert and oriented to person, place, and time. Mental status is at baseline.  Psychiatric:        Mood and Affect: Mood normal.        Behavior: Behavior normal.        Thought Content: Thought content normal.        Judgment: Judgment normal.     Results for orders placed or performed in visit on 12/08/19  WET PREP FOR [place], YEAST, CLUE   Specimen: Sterile Swab   Sterile Swab  Result  Value Ref Range   Trichomonas Exam Negative Negative   Yeast Exam Negative Negative   Clue Cell Exam Negative Negative  Urine Culture   Specimen: Urine   UR  Result Value Ref Range   Urine Culture, Routine Final report    Organism ID, Bacteria Comment   Microscopic Examination   Urine  Result Value Ref Range   WBC, UA 0-5 0 - 5 /hpf   RBC None seen 0 - 2 /hpf   Epithelial Cells (non renal) 0-10 0 - 10 /hpf   Bacteria, UA None seen None seen/Few  Bayer DCA Hb A1c Waived  Result Value Ref Range   HB A1C (BAYER DCA - WAIVED) 5.5 <7.8 %  Basic metabolic panel  Result Value Ref Range   Glucose 91 65 - 99 mg/dL   BUN 16 8 - 27 mg/dL   Creatinine, Ser 0.67 0.57 - 1.00 mg/dL   GFR calc non Af Amer 92 >59 mL/min/1.73   GFR calc Af Amer 106 >59 mL/min/1.73   BUN/Creatinine Ratio 24 12 - 28   Sodium 144 134 - 144 mmol/L   Potassium 4.7 3.5 - 5.2 mmol/L   Chloride 104 96 - 106 mmol/L   CO2 21 20 - 29 mmol/L   Calcium 9.6 8.7 - 10.3 mg/dL  Microalbumin, Urine Waived  Result Value Ref Range   Microalb, Ur Waived 30 (H) 0 - 19 mg/L   Creatinine, Urine Waived 50 10 - 300 mg/dL   Microalb/Creat Ratio 30-300 (H) <30 mg/g  TSH  Result Value Ref Range   TSH 1.460 0.450 - 4.500 uIU/mL  Lipid Panel w/o Chol/HDL Ratio  Result Value Ref Range   Cholesterol, Total 151 100 - 199 mg/dL   Triglycerides 114 0 - 149 mg/dL   HDL 37 (L) >39 mg/dL   VLDL Cholesterol Cal 21 5 - 40 mg/dL   LDL Chol Calc (NIH) 93 0 - 99 mg/dL  Urinalysis, Routine w reflex microscopic  Result Value Ref Range   Specific Gravity, UA 1.015 1.005 - 1.030   pH, UA 8.5 (H) 5.0 - 7.5   Color, UA Yellow Yellow   Appearance Ur Clear Clear   Leukocytes,UA Trace (A) Negative   Protein,UA Negative Negative/Trace   Glucose, UA Negative Negative   Ketones, UA Negative Negative   RBC, UA Negative Negative   Bilirubin, UA Negative Negative   Urobilinogen, Ur 0.2 0.2 - 1.0 mg/dL   Nitrite, UA Negative Negative   Microscopic  Examination See below:       Assessment & Plan:   Problem List Items Addressed This  Visit      Cardiovascular and Mediastinum   CHF (congestive heart failure) (HCC)    Chronic, euvolemic today.  Continue collaboration with cardiology.  Her last echo was in March 2018 with EF 55%.  Continue current medication regimen and adjust as needed.  Recommend: - Reminded to call for an overnight weight gain of >2 pounds or a weekly weight weight of >5 pounds - not adding salt to his food and has been reading food labels. Reviewed the importance of keeping daily sodium intake to 2000mg  daily       Relevant Orders   Lipid panel   Basic metabolic panel   Urinalysis, Routine w reflex microscopic   HgB A1c   Essential hypertension    Chronic.  Controlled.  Continue to monitor blood pressures at home.  Labs ordered today.       Relevant Orders   Lipid panel   Basic metabolic panel   Urinalysis, Routine w reflex microscopic   HgB A1c   Aortic atherosclerosis (HCC)    Found on imaging CT 01/2017.  Recommend she continue taking statin and ASA daily for prevention.  Continue complete cessation of smoking.      Abdominal aortic aneurysm (AAA) without rupture (HCC) - Primary    Stable.  Followed by vascular surgeon.  Had repeat US which showed AAA is 2.8cm and stable from previous exam.  Continue to follow up with surgeon.        Respiratory   Centrilobular emphysema (HCC)    Chronic, ongoing.  Continue current inhaler regimen and adjust as needed.  Recommend she continue to attend yearly lung CT screening for prevention.  Will plan on spirometry next visit.  Recommend cessation of vaping.          Endocrine   IFG (impaired fasting glucose)    Ongoing, stable.  Labs ordered today.       Relevant Orders   Lipid panel   Basic metabolic panel   Urinalysis, Routine w reflex microscopic   HgB A1c     Other   Hyperlipidemia    Chronic, ongoing.  Continue current medication regimen and  adjust as needed.  Lipid panel today.         Relevant Orders   Lipid panel   Basic metabolic panel   Urinalysis, Routine w reflex microscopic   HgB A1c   Panic disorder    Chronic, ongoing.  Continue current medication regimen, she is aware of risks with chronic benzo use, but uses appropriately with minimal use.  Last refill was one year ago. Denies SI/HI. Controlled substance agreement signed at visit today. Patient does not need a refill today.       Relevant Medications   hydrOXYzine (ATARAX/VISTARIL) 25 MG tablet    Other Visit Diagnoses    Encounter for screening mammogram for malignant neoplasm of breast       Relevant Orders   MM Digital Screening   Screening for osteoporosis       Relevant Orders   DG Bone Density   Controlled substance agreement signed           Follow up plan: Return in about 6 months (around 12/05/2020) for Physical and Fasting labs.

## 2020-06-01 NOTE — Assessment & Plan Note (Addendum)
Chronic, ongoing.  Continue current medication regimen, she is aware of risks with chronic benzo use, but uses appropriately with minimal use.  Last refill was one year ago. Denies SI/HI. Controlled substance agreement signed at visit today. Patient does not need a refill today.

## 2020-06-04 ENCOUNTER — Other Ambulatory Visit: Payer: Self-pay

## 2020-06-04 ENCOUNTER — Ambulatory Visit (INDEPENDENT_AMBULATORY_CARE_PROVIDER_SITE_OTHER): Payer: Medicare Other | Admitting: Nurse Practitioner

## 2020-06-04 ENCOUNTER — Encounter: Payer: Self-pay | Admitting: Nurse Practitioner

## 2020-06-04 VITALS — BP 126/67 | HR 59 | Temp 97.9°F | Ht 65.08 in | Wt 186.0 lb

## 2020-06-04 DIAGNOSIS — I714 Abdominal aortic aneurysm, without rupture, unspecified: Secondary | ICD-10-CM

## 2020-06-04 DIAGNOSIS — Z1231 Encounter for screening mammogram for malignant neoplasm of breast: Secondary | ICD-10-CM

## 2020-06-04 DIAGNOSIS — J432 Centrilobular emphysema: Secondary | ICD-10-CM

## 2020-06-04 DIAGNOSIS — I700 Atherosclerosis of aorta: Secondary | ICD-10-CM | POA: Diagnosis not present

## 2020-06-04 DIAGNOSIS — I10 Essential (primary) hypertension: Secondary | ICD-10-CM

## 2020-06-04 DIAGNOSIS — Z13820 Encounter for screening for osteoporosis: Secondary | ICD-10-CM

## 2020-06-04 DIAGNOSIS — F410 Panic disorder [episodic paroxysmal anxiety] without agoraphobia: Secondary | ICD-10-CM

## 2020-06-04 DIAGNOSIS — Z79899 Other long term (current) drug therapy: Secondary | ICD-10-CM

## 2020-06-04 DIAGNOSIS — R8290 Unspecified abnormal findings in urine: Secondary | ICD-10-CM

## 2020-06-04 DIAGNOSIS — I509 Heart failure, unspecified: Secondary | ICD-10-CM | POA: Diagnosis not present

## 2020-06-04 DIAGNOSIS — R7301 Impaired fasting glucose: Secondary | ICD-10-CM

## 2020-06-04 DIAGNOSIS — E782 Mixed hyperlipidemia: Secondary | ICD-10-CM

## 2020-06-04 LAB — URINALYSIS, ROUTINE W REFLEX MICROSCOPIC
Bilirubin, UA: NEGATIVE
Glucose, UA: NEGATIVE
Ketones, UA: NEGATIVE
Nitrite, UA: NEGATIVE
Protein,UA: NEGATIVE
RBC, UA: NEGATIVE
Specific Gravity, UA: 1.015 (ref 1.005–1.030)
Urobilinogen, Ur: 0.2 mg/dL (ref 0.2–1.0)
pH, UA: 8.5 — ABNORMAL HIGH (ref 5.0–7.5)

## 2020-06-04 LAB — MICROSCOPIC EXAMINATION: RBC, Urine: NONE SEEN /hpf (ref 0–2)

## 2020-06-04 MED ORDER — HYDROXYZINE HCL 25 MG PO TABS: 25.0000 mg | ORAL_TABLET | Freq: Three times a day (TID) | ORAL | 1 refills | Status: DC | PRN

## 2020-06-04 NOTE — Assessment & Plan Note (Signed)
Chronic, ongoing.  Continue current medication regimen and adjust as needed. Lipid panel today. 

## 2020-06-04 NOTE — Assessment & Plan Note (Signed)
Chronic, ongoing.  Continue current inhaler regimen and adjust as needed.  Recommend she continue to attend yearly lung CT screening for prevention.  Will plan on spirometry next visit.  Recommend cessation of vaping.

## 2020-06-04 NOTE — Assessment & Plan Note (Signed)
Stable.  Followed by vascular surgeon.  Had repeat US which showed AAA is 2.8cm and stable from previous exam.  Continue to follow up with surgeon. 

## 2020-06-04 NOTE — Assessment & Plan Note (Signed)
Found on imaging CT 01/2017.  Recommend she continue taking statin and ASA daily for prevention.  Continue complete cessation of smoking. 

## 2020-06-04 NOTE — Assessment & Plan Note (Signed)
Chronic, euvolemic today.  Continue collaboration with cardiology.  Her last echo was in March 2018 with EF 55%.  Continue current medication regimen and adjust as needed.  Recommend: °- Reminded to call for an overnight weight gain of >2 pounds or a weekly weight weight of >5 pounds °- not adding salt to his food and has been reading food labels. Reviewed the importance of keeping daily sodium intake to <2000mg daily  °

## 2020-06-04 NOTE — Addendum Note (Signed)
Addended by: Jon Billings on: 06/04/2020 03:25 PM   Modules accepted: Orders

## 2020-06-04 NOTE — Assessment & Plan Note (Signed)
Ongoing, stable.  Labs ordered today.

## 2020-06-04 NOTE — Assessment & Plan Note (Signed)
Chronic.  Controlled.  Continue to monitor blood pressures at home.  Labs ordered today.

## 2020-06-05 LAB — LIPID PANEL
Chol/HDL Ratio: 3.8 ratio (ref 0.0–4.4)
Cholesterol, Total: 146 mg/dL (ref 100–199)
HDL: 38 mg/dL — ABNORMAL LOW (ref >39)
LDL Chol Calc (NIH): 88 mg/dL (ref 0–99)
Triglycerides: 107 mg/dL (ref 0–149)
VLDL Cholesterol Cal: 20 mg/dL (ref 5–40)

## 2020-06-05 LAB — BASIC METABOLIC PANEL
BUN/Creatinine Ratio: 16 (ref 12–28)
BUN: 13 mg/dL (ref 8–27)
CO2: 26 mmol/L (ref 20–29)
Calcium: 9.6 mg/dL (ref 8.7–10.3)
Chloride: 104 mmol/L (ref 96–106)
Creatinine, Ser: 0.82 mg/dL (ref 0.57–1.00)
Glucose: 98 mg/dL (ref 65–99)
Potassium: 4.9 mmol/L (ref 3.5–5.2)
Sodium: 143 mmol/L (ref 134–144)
eGFR: 79 mL/min/{1.73_m2} (ref >59)

## 2020-06-05 LAB — HEMOGLOBIN A1C
Est. average glucose Bld gHb Est-mCnc: 111 mg/dL
Hgb A1c MFr Bld: 5.5 % (ref 4.8–5.6)

## 2020-06-05 NOTE — Progress Notes (Signed)
Hi Ms. Cataleyah.  Overall your lab work looks great. Cholesterol is well controlled. Liver, Kidneys, electrolytes, and diabetes are all within normal limits.  Your urine showed some bacteria.  I have sent it for culture to see if you need to be treated with an antibiotic. Please let me know if you have any questions.

## 2020-06-08 MED ORDER — NITROFURANTOIN MONOHYD MACRO 100 MG PO CAPS: 100.0000 mg | ORAL_CAPSULE | Freq: Two times a day (BID) | ORAL | 0 refills | Status: DC

## 2020-06-21 ENCOUNTER — Telehealth: Payer: Self-pay

## 2020-06-21 NOTE — Telephone Encounter (Signed)
Form signed.

## 2020-06-21 NOTE — Telephone Encounter (Signed)
Patient called in to check the status of the form that needed to be signed, I did advise her that the form is signed.

## 2020-06-21 NOTE — Telephone Encounter (Signed)
Called and LVM letting patient know what her form was signed and ready to be picked up.

## 2020-06-21 NOTE — Telephone Encounter (Signed)
Pt brought form for disability license plate form left in bin to be reviewed. Pt stated pcp was aware.

## 2020-06-21 NOTE — Telephone Encounter (Signed)
Form started and placed in providers folder for signature.

## 2020-07-05 ENCOUNTER — Other Ambulatory Visit: Payer: Self-pay | Admitting: Nurse Practitioner

## 2020-07-05 DIAGNOSIS — Z1231 Encounter for screening mammogram for malignant neoplasm of breast: Secondary | ICD-10-CM

## 2020-07-09 ENCOUNTER — Other Ambulatory Visit: Payer: Medicare Other

## 2020-07-24 NOTE — Progress Notes (Signed)
BP 120/62   Pulse 62   Temp (!) 97.3 F (36.3 C) (Oral)   Wt 183 lb 9.6 oz (83.3 kg)   LMP  (LMP Unknown)   BMI 30.48 kg/m    Subjective:    Patient ID: Brandi Steele, female    DOB: [birth year], 65 y.o.   MRN: [id number]  HPI: Brandi Steele is a 65 y.o. female  Chief Complaint  Patient presents with   Pain    Pt states she is still having back pain on the R side. States she has had it for about 6 weeks and hurts worse when sitting.    Patient states she is having pain on the right side of her back that radiates to the front of her abdomen.  Patient states she feels like it is deeper than a muscle.  Patient states she leans a lot to her right side especially when she is sitting. Patient has had sciatica in the past.  Denies radiating down her legs.   Relevant past medical, surgical, family and social history reviewed and updated as indicated. Interim medical history since our last visit reviewed. Allergies and medications reviewed and updated.  Review of Systems  Gastrointestinal:  Positive for abdominal pain.  Genitourinary:  Negative for flank pain.  Musculoskeletal:  Positive for back pain.   Per HPI unless specifically indicated above     Objective:    BP 120/62   Pulse 62   Temp (!) 97.3 F (36.3 C) (Oral)   Wt 183 lb 9.6 oz (83.3 kg)   LMP  (LMP Unknown)   BMI 30.48 kg/m   Wt Readings from Last 3 Encounters:  07/25/20 183 lb 9.6 oz (83.3 kg)  06/04/20 186 lb (84.4 kg)  03/22/20 189 lb (85.7 kg)    Physical Exam Vitals and nursing note reviewed.  Constitutional:      General: She is not in acute distress.    Appearance: Normal appearance. She is normal weight. She is not ill-appearing, toxic-appearing or diaphoretic.  HENT:     Head: Normocephalic.     Right Ear: External ear normal.     Left Ear: External ear normal.     Nose: Nose normal.     Mouth/Throat:     Mouth: Mucous membranes are moist.     Pharynx: Oropharynx is clear.  Eyes:      General:        Right eye: No discharge.        Left eye: No discharge.     Extraocular Movements: Extraocular movements intact.     Conjunctiva/sclera: Conjunctivae normal.     Pupils: Pupils are equal, round, and reactive to light.  Cardiovascular:     Rate and Rhythm: Normal rate and regular rhythm.     Heart sounds: No murmur heard. Pulmonary:     Effort: Pulmonary effort is normal. No respiratory distress.     Breath sounds: Normal breath sounds. No wheezing or rales.  Abdominal:     General: Abdomen is flat. Bowel sounds are normal. There is no distension.     Palpations: Abdomen is soft.     Tenderness: There is no abdominal tenderness. There is no right CVA tenderness, left CVA tenderness or guarding.  Musculoskeletal:     Cervical back: Normal range of motion and neck supple.     Lumbar back: Tenderness and bony tenderness present. No swelling, edema or spasms. Normal range of motion.  Skin:    General: Skin  is warm and dry.     Capillary Refill: Capillary refill takes less than 2 seconds.  Neurological:     General: No focal deficit present.     Mental Status: She is alert and oriented to person, place, and time. Mental status is at baseline.  Psychiatric:        Mood and Affect: Mood normal.        Behavior: Behavior normal.        Thought Content: Thought content normal.        Judgment: Judgment normal.    Results for orders placed or performed in visit on 06/04/20  Microscopic Examination   Urine  Result Value Ref Range   WBC, UA 0-5 0 - 5 /hpf   RBC None seen 0 - 2 /hpf   Epithelial Cells (non renal) 0-10 0 - 10 /hpf   Crystals Present (A) N/A   Crystal Type Calcium Oxalate N/A   Bacteria, UA Moderate (A) None seen/Few  Lipid panel  Result Value Ref Range   Cholesterol, Total 146 100 - 199 mg/dL   Triglycerides 107 0 - 149 mg/dL   HDL 38 (L) >39 mg/dL   VLDL Cholesterol Cal 20 5 - 40 mg/dL   LDL Chol Calc (NIH) 88 0 - 99 mg/dL   Chol/HDL Ratio 3.8 0.0 -  4.4 ratio  Basic metabolic panel  Result Value Ref Range   Glucose 98 65 - 99 mg/dL   BUN 13 8 - 27 mg/dL   Creatinine, Ser 0.82 0.57 - 1.00 mg/dL   eGFR 79 >59 mL/min/1.73   BUN/Creatinine Ratio 16 12 - 28   Sodium 143 134 - 144 mmol/L   Potassium 4.9 3.5 - 5.2 mmol/L   Chloride 104 96 - 106 mmol/L   CO2 26 20 - 29 mmol/L   Calcium 9.6 8.7 - 10.3 mg/dL  Urinalysis, Routine w reflex microscopic  Result Value Ref Range   Specific Gravity, UA 1.015 1.005 - 1.030   pH, UA 8.5 (H) 5.0 - 7.5   Color, UA Yellow Yellow   Appearance Ur Cloudy (A) Clear   Leukocytes,UA Trace (A) Negative   Protein,UA Negative Negative/Trace   Glucose, UA Negative Negative   Ketones, UA Negative Negative   RBC, UA Negative Negative   Bilirubin, UA Negative Negative   Urobilinogen, Ur 0.2 0.2 - 1.0 mg/dL   Nitrite, UA Negative Negative   Microscopic Examination See below:   HgB A1c  Result Value Ref Range   Hgb A1c MFr Bld 5.5 4.8 - 5.6 %   Est. average glucose Bld gHb Est-mCnc 111 mg/dL      Assessment & Plan:   Problem List Items Addressed This Visit   None Visit Diagnoses     Acute right-sided low back pain, unspecified whether sciatica present    -  Primary   Prednisone sent to help with inflammation. UA ordered. Xray ordered. Will make recommendations based on lab results.    Relevant Medications   methylPREDNISolone (MEDROL DOSEPAK) 4 MG TBPK tablet   Other Relevant Orders   Urinalysis, Routine w reflex microscopic   DG Lumbar Spine Complete        Follow up plan: Return if symptoms worsen or fail to improve.

## 2020-07-25 ENCOUNTER — Other Ambulatory Visit: Payer: Self-pay

## 2020-07-25 ENCOUNTER — Ambulatory Visit (INDEPENDENT_AMBULATORY_CARE_PROVIDER_SITE_OTHER): Payer: Medicare Other | Admitting: Nurse Practitioner

## 2020-07-25 ENCOUNTER — Ambulatory Visit
Admission: RE | Admit: 2020-07-25 | Discharge: 2020-07-25 | Disposition: A | Discharge status: Home / Self Care | Payer: Medicare Other | Source: Home / Self Care | Attending: Nurse Practitioner | Admitting: Nurse Practitioner

## 2020-07-25 ENCOUNTER — Ambulatory Visit
Admission: RE | Admit: 2020-07-25 | Discharge: 2020-07-25 | Disposition: A | Discharge status: Home / Self Care | Payer: Medicare Other | Source: Ambulatory Visit | Attending: Nurse Practitioner | Admitting: Nurse Practitioner

## 2020-07-25 ENCOUNTER — Encounter: Payer: Self-pay | Admitting: Nurse Practitioner

## 2020-07-25 VITALS — BP 120/62 | HR 62 | Temp 97.3°F | Wt 183.6 lb

## 2020-07-25 DIAGNOSIS — M545 Low back pain, unspecified: Secondary | ICD-10-CM | POA: Diagnosis present

## 2020-07-25 LAB — URINALYSIS, ROUTINE W REFLEX MICROSCOPIC
Bilirubin, UA: NEGATIVE
Glucose, UA: NEGATIVE
Ketones, UA: NEGATIVE
Leukocytes,UA: NEGATIVE
Nitrite, UA: NEGATIVE
Protein,UA: NEGATIVE
Specific Gravity, UA: 1.01 (ref 1.005–1.030)
Urobilinogen, Ur: 0.2 mg/dL (ref 0.2–1.0)
pH, UA: 7 (ref 5.0–7.5)

## 2020-07-25 LAB — MICROSCOPIC EXAMINATION
Bacteria, UA: NONE SEEN
WBC, UA: NONE SEEN /hpf (ref 0–5)

## 2020-07-25 MED ORDER — METHYLPREDNISOLONE 4 MG PO TBPK
ORAL_TABLET | ORAL | 0 refills | Status: DC
Start: 2020-07-25 — End: 2020-09-10

## 2020-07-25 NOTE — Progress Notes (Signed)
Results discussed with patient during office visit.

## 2020-07-26 NOTE — Addendum Note (Signed)
Addended by: Jon Billings on: 07/26/2020 09:47 AM   Modules accepted: Orders

## 2020-07-26 NOTE — Progress Notes (Signed)
Please let patient know that her xray did not show any problems with the lumbar spine.  I think Patient needs to see an orthopedic doctor for further evaluation of pain.  I have placed the referral.

## 2020-07-26 NOTE — Progress Notes (Signed)
Fibroids are in the uterus. If patient is concerned about her ovaries we can do a transvaginal ultrasound to evaluate them.

## 2020-07-30 ENCOUNTER — Telehealth: Payer: Self-pay | Admitting: Nurse Practitioner

## 2020-07-30 DIAGNOSIS — R1030 Lower abdominal pain, unspecified: Secondary | ICD-10-CM

## 2020-07-30 NOTE — Progress Notes (Signed)
Order placed for US

## 2020-07-30 NOTE — Telephone Encounter (Signed)
Order placed for patient to have transvaginal US done.

## 2020-08-01 ENCOUNTER — Telehealth: Payer: Self-pay | Admitting: Nurse Practitioner

## 2020-08-01 DIAGNOSIS — R1030 Lower abdominal pain, unspecified: Secondary | ICD-10-CM

## 2020-08-01 NOTE — Telephone Encounter (Signed)
Ordered U/S

## 2020-08-13 ENCOUNTER — Ambulatory Visit: Payer: Medicare Other

## 2020-08-22 ENCOUNTER — Telehealth: Payer: Self-pay

## 2020-08-22 NOTE — Telephone Encounter (Signed)
Copied from [place] [id number]. Topic: Appointment Scheduling - Scheduling Inquiry for Clinic >> Aug 22, 2020 11:22 AM Bayard Beaver wrote: Reason for [id number] returning call, needs to reschedule AWV, please call back

## 2020-09-10 ENCOUNTER — Ambulatory Visit (INDEPENDENT_AMBULATORY_CARE_PROVIDER_SITE_OTHER): Payer: Medicare Other | Admitting: Dermatology

## 2020-09-10 ENCOUNTER — Other Ambulatory Visit: Payer: Self-pay

## 2020-09-10 DIAGNOSIS — L918 Other hypertrophic disorders of the skin: Secondary | ICD-10-CM

## 2020-09-10 DIAGNOSIS — L578 Other skin changes due to chronic exposure to nonionizing radiation: Secondary | ICD-10-CM | POA: Diagnosis not present

## 2020-09-10 DIAGNOSIS — L820 Inflamed seborrheic keratosis: Secondary | ICD-10-CM | POA: Diagnosis not present

## 2020-09-10 DIAGNOSIS — L821 Other seborrheic keratosis: Secondary | ICD-10-CM | POA: Diagnosis not present

## 2020-09-10 NOTE — Patient Instructions (Addendum)
Seborrheic Keratosis  What causes seborrheic keratoses? Seborrheic keratoses are harmless, common skin growths that first appear during adult life.  As time goes by, more growths appear.  Some people may develop a large number of them.  Seborrheic keratoses appear on both covered and uncovered body parts.  They are not caused by sunlight.  The tendency to develop seborrheic keratoses can be inherited.  They vary in color from skin-colored to gray, brown, or even black.  They can be either smooth or have a rough, warty surface.   Seborrheic keratoses are superficial and look as if they were stuck on the skin.  Under the microscope this type of keratosis looks like layers upon layers of skin.  That is why at times the top layer may seem to fall off, but the rest of the growth remains and re-grows.    Treatment Seborrheic keratoses do not need to be treated, but can easily be removed in the office.  Seborrheic keratoses often cause symptoms when they rub on clothing or jewelry.  Lesions can be in the way of shaving.  If they become inflamed, they can cause itching, soreness, or burning.  Removal of a seborrheic keratosis can be accomplished by freezing, burning, or surgery. If any spot bleeds, scabs, or grows rapidly, please return to have it checked, as these can be an indication of a skin cancer.  Cryotherapy Aftercare  Wash gently with soap and water everyday.   Apply Vaseline and Band-Aid daily until healed.   Melanoma ABCDEs  Melanoma is the most dangerous type of skin cancer, and is the leading cause of death from skin disease.  You are more likely to develop melanoma if you: Have light-colored skin, light-colored eyes, or red or blond hair Spend a lot of time in the sun Tan regularly, either outdoors or in a tanning bed Have had blistering sunburns, especially during childhood Have a close family member who has had a melanoma Have atypical moles or large birthmarks  Early detection of  melanoma is key since treatment is typically straightforward and cure rates are extremely high if we catch it early.   The first sign of melanoma is often a change in a mole or a new dark spot.  The ABCDE system is a way of remembering the signs of melanoma.  A for asymmetry:  The two halves do not match. B for border:  The edges of the growth are irregular. C for color:  A mixture of colors are present instead of an even brown color. D for diameter:  Melanomas are usually (but not always) greater than 29m - the size of a pencil eraser. E for evolution:  The spot keeps changing in size, shape, and color.  Please check your skin once per month between visits. You can use a small mirror in front and a large mirror behind you to keep an eye on the back side or your body.   If you see any new or changing lesions before your next follow-up, please call to schedule a visit.  Please continue daily skin protection including broad spectrum sunscreen SPF 30+ to sun-exposed areas, reapplying every 2 hours as needed when you're outdoors.   Staying in the shade or wearing long sleeves, sun glasses (UVA+UVB protection) and wide brim hats (4-inch brim around the entire circumference of the hat) are also recommended for sun protection.    If you have any questions or concerns for your doctor, please call our main line at [phone]  and press option 4 to reach your doctor's medical assistant. If no one answers, please leave a voicemail as directed and we will return your call as soon as possible. Messages left after 4 pm will be answered the following business day.   You may also send us a message via MyChart. We typically respond to MyChart messages within 1-2 business days.  For prescription refills, please ask your pharmacy to contact our office. Our fax number is [phone].  If you have an urgent issue when the clinic is closed that cannot wait until the next business day, you can page your doctor at  the number below.    Please note that while we do our best to be available for urgent issues outside of office hours, we are not available 24/7.   If you have an urgent issue and are unable to reach us, you may choose to seek medical care at your doctor's office, retail clinic, urgent care center, or emergency room.  If you have a medical emergency, please immediately call 911 or go to the emergency department.  Pager Numbers  - Dr. Kowalski: [phone]  - Dr. Moye: [phone]  - Dr. Stewart: [phone]  In the event of inclement weather, please call our main line at [phone] for an update on the status of any delays or closures.  Dermatology Medication Tips: Please keep the boxes that topical medications come in in order to help keep track of the instructions about where and how to use these. Pharmacies typically print the medication instructions only on the boxes and not directly on the medication tubes.   If your medication is too expensive, please contact our office at [phone] option 4 or send us a message through MyChart.   We are unable to tell what your co-pay for medications will be in advance as this is different depending on your insurance coverage. However, we may be able to find a substitute medication at lower cost or fill out paperwork to get insurance to cover a needed medication.   If a prior authorization is required to get your medication covered by your insurance company, please allow us 1-2 business days to complete this process.  Drug prices often vary depending on where the prescription is filled and some pharmacies may offer cheaper prices.  The website www.goodrx.com contains coupons for medications through different pharmacies. The prices here do not account for what the cost may be with help from insurance (it may be cheaper with your insurance), but the website can give you the price if you did not use any insurance.  - You can print the  associated coupon and take it with your prescription to the pharmacy.  - You may also stop by our office during regular business hours and pick up a GoodRx coupon card.  - If you need your prescription sent electronically to a different pharmacy, notify our office through  MyChart or by phone at [phone] option 4.  

## 2020-09-10 NOTE — Progress Notes (Signed)
   Follow-Up Visit   Subjective  Brandi Steele is a 65 y.o. female who presents for the following: New Patient (Initial Visit) (Patient here today concerning a spot on left lower leg. She states she has a history of aks ). Patient has no history of skin cancer.  She complains of irritating lesions on her neck for which she would like treatment.  They are traumatized by her clothing.  The following portions of the chart were reviewed this encounter and updated as appropriate:  Tobacco  Allergies  Meds  Problems  Med Hx  Surg Hx  Fam Hx      Objective  Well appearing patient in no apparent distress; mood and affect are within normal limits.  A focused examination was performed including face, neck, legs. Relevant physical exam findings are noted in the Assessment and Plan.  bilateral neck x 20 (20) Irritating traumatized skin tags   left lower leg x 2, left cheek x 2 (4) Erythematous keratotic or waxy stuck-on papule or plaque.   Assessment & Plan  Acrochordon (20) -inflamed and traumatized and symptomatic bilateral neck x 20  Destruction of lesion - bilateral neck x 20 Complexity: simple   Destruction method: cryotherapy   Informed consent: discussed and consent obtained   Timeout:  patient name, date of birth, surgical site, and procedure verified Lesion destroyed using liquid nitrogen: Yes   Region frozen until ice ball extended beyond lesion: Yes   Outcome: patient tolerated procedure well with no complications   Post-procedure details: wound care instructions given    Inflamed seborrheic keratosis left lower leg x 2, left cheek x 2  Destruction of lesion - left lower leg x 2, left cheek x 2 Complexity: simple   Destruction method: cryotherapy   Informed consent: discussed and consent obtained   Timeout:  patient name, date of birth, surgical site, and procedure verified Lesion destroyed using liquid nitrogen: Yes   Region frozen until ice ball extended beyond  lesion: Yes   Outcome: patient tolerated procedure well with no complications   Post-procedure details: wound care instructions given    Seborrheic Keratoses - Stuck-on, waxy, tan-brown papules and/or plaques  - Benign-appearing - Discussed benign etiology and prognosis. - Observe - Call for any changes  Acrochordons (Skin Tags) - Fleshy, skin-colored pedunculated papules - Benign appearing.  - Observe. - If desired, they can be removed with an in office procedure that is not covered by insurance. - Please call the clinic if you notice any new or changing lesions.  Actinic Damage - chronic, secondary to cumulative UV radiation exposure/sun exposure over time - diffuse scaly erythematous macules with underlying dyspigmentation - Recommend daily broad spectrum sunscreen SPF 30+ to sun-exposed areas, reapply every 2 hours as needed.  - Recommend staying in the shade or wearing long sleeves, sun glasses (UVA+UVB protection) and wide brim hats (4-inch brim around the entire circumference of the hat). - Call for new or changing lesions.  Return in about 1 year (around 09/10/2021) for tbse.  IRuthell Rummage, CMA, am acting as scribe for Sarina Ser, MD. Documentation: I have reviewed the above documentation for accuracy and completeness, and I agree with the above.  Sarina Ser, MD

## 2020-09-15 ENCOUNTER — Encounter: Payer: Self-pay | Admitting: Dermatology

## 2020-10-07 ENCOUNTER — Other Ambulatory Visit: Payer: Self-pay | Admitting: Nurse Practitioner

## 2020-10-07 NOTE — Telephone Encounter (Signed)
Requested medication (s) are due for refill today: yes  Requested medication (s) are on the active medication list: yes  Last refill:  03/07/20 #90 1 RF  Future visit scheduled: yes  Notes to clinic:  overdue labs   Requested Prescriptions  Pending Prescriptions Disp Refills   clopidogrel (PLAVIX) 75 MG tablet [Pharmacy Med Name: CLOPIDOGREL '75MG'$  TABLETS] 90 tablet     Sig: TAKE 1 TABLET(75 MG) BY MOUTH DAILY     Hematology: Antiplatelets - clopidogrel Failed - 10/07/2020  7:48 AM      Failed - Evaluate AST, ALT within 2 months of therapy initiation.      Failed - ALT in normal range and within 360 days    ALT  Date Value Ref Range Status  05/02/2019 11 0 - 32 IU/L Final   SGPT (ALT)  Date Value Ref Range Status  06/16/2013 17 12 - 78 U/L Final          Failed - AST in normal range and within 360 days    AST  Date Value Ref Range Status  05/02/2019 14 0 - 40 IU/L Final   SGOT(AST)  Date Value Ref Range Status  06/16/2013 17 15 - 37 Unit/L Final          Failed - HCT in normal range and within 180 days    Hematocrit  Date Value Ref Range Status  09/01/2018 40.4 34.0 - 46.6 % Final          Failed - HGB in normal range and within 180 days    Hemoglobin  Date Value Ref Range Status  09/01/2018 13.3 11.1 - 15.9 g/dL Final          Failed - PLT in normal range and within 180 days    Platelets  Date Value Ref Range Status  09/01/2018 174 150 - 450 x10E3/uL Final          Passed - Valid encounter within last 6 months    Recent Outpatient Visits           2 months ago Acute right-sided low back pain, unspecified whether sciatica present   [facility] Jon Billings, NP   4 months ago Abdominal aortic aneurysm (AAA) without rupture ([place])   [facility] Jon Billings, NP   7 months ago Atypical mole   [facility] Jon Billings, NP   10 months ago Centrilobular emphysema ([place])   [place], Henrine Screws T, NP   1 year ago Inflamed skin tag   [place], Lilia Argue, [place]       Future Appointments             In 1 week  [facility], [place]   In 1 month [place], Santiago Glad, NP [facility], [facility]            Signed Prescriptions Disp Refills   lisinopril (ZESTRIL) 2.5 MG tablet 90 tablet 0    Sig: TAKE 1 TABLET(2.5 MG) BY MOUTH DAILY     Cardiovascular:  ACE Inhibitors Passed - 10/07/2020  7:48 AM      Passed - Cr in normal range and within 180 days    Creatinine  Date Value Ref Range Status  06/16/2013 0.63 0.60 - 1.30 mg/dL Final   Creatinine, Ser  Date Value Ref Range Status  06/04/2020 0.82 0.57 - 1.00 mg/dL Final          Passed - K in normal  range and within 180 days    Potassium  Date Value Ref Range Status  06/04/2020 4.9 3.5 - 5.2 mmol/L Final  06/16/2013 4.0 3.5 - 5.1 mmol/L Final          Passed - Patient is not pregnant      Passed - Last BP in normal range    BP Readings from Last 1 Encounters:  07/25/20 120/62          Passed - Valid encounter within last 6 months    Recent Outpatient Visits           2 months ago Acute right-sided low back pain, unspecified whether sciatica present   [facility] Jon Billings, NP   4 months ago Abdominal aortic aneurysm (AAA) without rupture ([place])   [facility] Jon Billings, NP   7 months ago Atypical mole   [facility] Jon Billings, NP   10 months ago Centrilobular emphysema ([place])   [place], [place] T, NP   1 year ago Inflamed skin tag   [place], Lilia Argue, [place]       Future Appointments             In 1 week  [facility], [place]   In 1 month Jon Billings, NP [place], [facility]             rosuvastatin (CRESTOR) 10 MG tablet 90 tablet 0    Sig: TAKE 1 TABLET(10 MG) BY MOUTH DAILY     Cardiovascular:   Antilipid - Statins Failed - 10/07/2020  7:48 AM      Failed - HDL in normal range and within 360 days    HDL  Date Value Ref Range Status  06/04/2020 38 (L) >39 mg/dL Final          Passed - Total Cholesterol in normal range and within 360 days    Cholesterol, Total  Date Value Ref Range Status  06/04/2020 146 100 - 199 mg/dL Final   Cholesterol Piccolo, Waived  Date Value Ref Range Status  01/05/2015 142 <200 mg/dL Final    Comment:                            Desirable                <200                         Borderline High      200- 239                         High                     >239           Passed - LDL in normal range and within 360 days    LDL Chol Calc (NIH)  Date Value Ref Range Status  06/04/2020 88 0 - 99 mg/dL Final          Passed - Triglycerides in normal range and within 360 days    Triglycerides  Date Value Ref Range Status  06/04/2020 107 0 - 149 mg/dL Final   Triglycerides Piccolo,Waived  Date Value Ref Range Status  01/05/2015 138 <150 mg/dL Final    Comment:  Normal                   <150                         Borderline High     150 - 199                         High                200 - 499                         Very High                >499           Passed - Patient is not pregnant      Passed - Valid encounter within last 12 months    Recent Outpatient Visits           2 months ago Acute right-sided low back pain, unspecified whether sciatica present   [place], NP   4 months ago Abdominal aortic aneurysm (AAA) without rupture [facility])   [facility] Jon Billings, NP   7 months ago Atypical mole   [facility] Jon Billings, NP   10 months ago Centrilobular emphysema [facility])   [place], Henrine Screws T, NP   1 year ago Inflamed skin tag   [facility] American, [place]       Future  Appointments             In 1 week  [facility], [place]   In 1 month Jon Billings, NP [facility], [place]

## 2020-10-07 NOTE — Telephone Encounter (Signed)
Requested Prescriptions  Pending Prescriptions Disp Refills  . lisinopril (ZESTRIL) 2.5 MG tablet [Pharmacy Med Name: LISINOPRIL 2.'5MG'$  TABLETS] 90 tablet 0    Sig: TAKE 1 TABLET(2.5 MG) BY MOUTH DAILY     Cardiovascular:  ACE Inhibitors Passed - 10/07/2020  7:48 AM      Passed - Cr in normal range and within 180 days    Creatinine  Date Value Ref Range Status  06/16/2013 0.63 0.60 - 1.30 mg/dL Final   Creatinine, Ser  Date Value Ref Range Status  06/04/2020 0.82 0.57 - 1.00 mg/dL Final         Passed - K in normal range and within 180 days    Potassium  Date Value Ref Range Status  06/04/2020 4.9 3.5 - 5.2 mmol/L Final  06/16/2013 4.0 3.5 - 5.1 mmol/L Final         Passed - Patient is not pregnant      Passed - Last BP in normal range    BP Readings from Last 1 Encounters:  07/25/20 120/62         Passed - Valid encounter within last 6 months    Recent Outpatient Visits          2 months ago Acute right-sided low back pain, unspecified whether sciatica present   [place], NP   4 months ago Abdominal aortic aneurysm (AAA) without rupture [facility])   [facility] Jon Billings, NP   7 months ago Atypical mole   [facility] Jon Billings, NP   10 months ago Centrilobular emphysema [facility])   [place], Henrine Screws T, NP   1 year ago Inflamed skin tag   [facility], [place]      Future Appointments            In 1 week  [facility], [place]   In 1 month Jon Billings, NP [facility], [place]           . rosuvastatin (CRESTOR) 10 MG tablet [Pharmacy Med Name: ROSUVASTATIN '10MG'$  TABLETS] 90 tablet 0    Sig: TAKE 1 TABLET(10 MG) BY MOUTH DAILY     Cardiovascular:  Antilipid - Statins Failed - 10/07/2020  7:48 AM      Failed - HDL in normal range and within 360 days    HDL  Date Value Ref Range Status  06/04/2020 38 (L) >39 mg/dL  Final         Passed - Total Cholesterol in normal range and within 360 days    Cholesterol, Total  Date Value Ref Range Status  06/04/2020 146 100 - 199 mg/dL Final   Cholesterol Piccolo, Waived  Date Value Ref Range Status  01/05/2015 142 <200 mg/dL Final    Comment:                            Desirable                <200                         Borderline High      200- 239                         High                     >  239          Passed - LDL in normal range and within 360 days    LDL Chol Calc (NIH)  Date Value Ref Range Status  06/04/2020 88 0 - 99 mg/dL Final         Passed - Triglycerides in normal range and within 360 days    Triglycerides  Date Value Ref Range Status  06/04/2020 107 0 - 149 mg/dL Final   Triglycerides Piccolo,Waived  Date Value Ref Range Status  01/05/2015 138 <150 mg/dL Final    Comment:                            Normal                   <150                         Borderline High     150 - 199                         High                200 - 499                         Very High                >499          Passed - Patient is not pregnant      Passed - Valid encounter within last 12 months    Recent Outpatient Visits          2 months ago Acute right-sided low back pain, unspecified whether sciatica present   [place], NP   4 months ago Abdominal aortic aneurysm (AAA) without rupture [facility])   [facility] Jon Billings, NP   7 months ago Atypical mole   [facility] Jon Billings, NP   10 months ago Centrilobular emphysema [facility])   [place], Henrine Screws T, NP   1 year ago Inflamed skin tag   [facility] [employer], [place]      Future Appointments            In 1 week  [facility], [place]   In 1 month Jon Billings, NP [employer] [facility], [place]           . clopidogrel (PLAVIX) 75 MG tablet  [Pharmacy Med Name: CLOPIDOGREL '75MG'$  TABLETS] 90 tablet     Sig: TAKE 1 TABLET(75 MG) BY MOUTH DAILY     Hematology: Antiplatelets - clopidogrel Failed - 10/07/2020  7:48 AM      Failed - Evaluate AST, ALT within 2 months of therapy initiation.      Failed - ALT in normal range and within 360 days    ALT  Date Value Ref Range Status  05/02/2019 11 0 - 32 IU/L Final   SGPT (ALT)  Date Value Ref Range Status  06/16/2013 17 12 - 78 U/L Final         Failed - AST in normal range and within 360 days    AST  Date Value Ref Range Status  05/02/2019 14 0 - 40 IU/L Final   SGOT(AST)  Date Value Ref Range Status  06/16/2013 17 15 -  37 Unit/L Final         Failed - HCT in normal range and within 180 days    Hematocrit  Date Value Ref Range Status  09/01/2018 40.4 34.0 - 46.6 % Final         Failed - HGB in normal range and within 180 days    Hemoglobin  Date Value Ref Range Status  09/01/2018 13.3 11.1 - 15.9 g/dL Final         Failed - PLT in normal range and within 180 days    Platelets  Date Value Ref Range Status  09/01/2018 174 150 - 450 x10E3/uL Final         Passed - Valid encounter within last 6 months    Recent Outpatient Visits          2 months ago Acute right-sided low back pain, unspecified whether sciatica present   [facility] Jon Billings, NP   4 months ago Abdominal aortic aneurysm (AAA) without rupture [facility])   [facility] Jon Billings, NP   7 months ago Atypical mole   [facility] Jon Billings, NP   10 months ago Centrilobular emphysema [facility])   [place], Henrine Screws T, NP   1 year ago Inflamed skin tag   [place], Lilia Argue, [place]      Future Appointments            In 1 week  [facility], [place]   In 1 month Jon Billings, NP [employer] [facility], [place]

## 2020-10-17 ENCOUNTER — Ambulatory Visit: Payer: Medicare Other

## 2020-10-19 ENCOUNTER — Ambulatory Visit (INDEPENDENT_AMBULATORY_CARE_PROVIDER_SITE_OTHER): Payer: Medicare Other

## 2020-10-19 VITALS — Ht 65.5 in | Wt 178.0 lb

## 2020-10-19 DIAGNOSIS — Z0000 Encounter for general adult medical examination without abnormal findings: Secondary | ICD-10-CM

## 2020-10-19 NOTE — Progress Notes (Signed)
I connected with Brandi Steele today by telephone and verified that I am speaking with the correct person using two identifiers. Location patient: home Location provider: work Persons participating in the virtual visit: Brandi Steele, Brandi Steele.   I discussed the limitations, risks, security and privacy concerns of performing an evaluation and management service by telephone and the availability of in person appointments. I also discussed with the patient that there may be a patient responsible charge related to this service. The patient expressed understanding and verbally consented to this telephonic visit.    Interactive audio and video telecommunications were attempted between this provider and patient, however failed, due to patient having technical difficulties OR patient did not have access to video capability.  We continued and completed visit with audio only.     Vital signs may be patient reported or missing.  Subjective:   Brandi Steele is a 65 y.o. female who presents for Medicare Annual (Subsequent) preventive examination.  Review of Systems     Cardiac Risk Factors include: advanced age (>66men, >26 women);dyslipidemia;hypertension;sedentary lifestyle     Objective:    Today's Vitals   10/19/20 0819  Weight: 178 lb (80.7 kg)  Height: 5' 5.5" (1.664 m)   Body mass index is 29.17 kg/m.  Advanced Directives 10/19/2020 10/17/2019 05/22/2015 01/07/2015  Does Patient Have a Medical Advance Directive? No No No No  Would patient like information on creating a medical advance directive? - - No - patient declined information No - patient declined information    Current Medications (verified) Outpatient Encounter Medications as of 10/19/2020  Medication Sig   albuterol (VENTOLIN HFA) 108 (90 Base) MCG/ACT inhaler INHALE 2 PUFFS INTO THE LUNGS EVERY 6 HOURS AS NEEDED FOR WHEEZING OR SHORTNESS OF BREATH   Calcium Carbonate-Vit D-Min (CALCIUM 1200 PO) Take by mouth  daily.   cetirizine (ZYRTEC) 10 MG tablet Take 1 tablet (10 mg total) by mouth daily.   clopidogrel (PLAVIX) 75 MG tablet TAKE 1 TABLET(75 MG) BY MOUTH DAILY   cyclobenzaprine (FLEXERIL) 10 MG tablet Take 1 tablet (10 mg total) by mouth 3 (three) times daily as needed for muscle spasms (for firbromyalgia.).   esomeprazole (NEXIUM) 40 MG capsule Take 1 tab two times daily.   hydrOXYzine (ATARAX/VISTARIL) 25 MG tablet Take 1 tablet (25 mg total) by mouth 3 (three) times daily as needed.   lisinopril (ZESTRIL) 2.5 MG tablet TAKE 1 TABLET(2.5 MG) BY MOUTH DAILY   Magnesium 400 MG CAPS Take by mouth. Take 6 tablets daily   meloxicam (MOBIC) 15 MG tablet TAKE 1 TABLET(15 MG) BY MOUTH DAILY   Potassium Gluconate 595 MG CAPS Take by mouth. Take 4 capsules daily   rosuvastatin (CRESTOR) 10 MG tablet TAKE 1 TABLET(10 MG) BY MOUTH DAILY   triamcinolone cream (KENALOG) 0.1 % Apply 1 application 2 (two) times daily topically.   ALPRAZolam (XANAX) 0.5 MG tablet Take 1 tablet (0.5 mg total) by mouth 2 (two) times daily as needed. for anxiety (Patient not taking: Reported on 10/19/2020)   budesonide-formoterol (SYMBICORT) 160-4.5 MCG/ACT inhaler Inhale 2 puffs into the lungs 2 (two) times daily. (Patient not taking: Reported on 10/19/2020)   furosemide (LASIX) 40 MG tablet Take 1 tablet (40 mg total) by mouth daily as needed. (Patient not taking: No sig reported)   No facility-administered encounter medications on file as of 10/19/2020.    Allergies (verified) Morphine and related, Celexa [citalopram hydrobromide], Effexor [venlafaxine], Lipitor [atorvastatin], Shellfish allergy, and Wellbutrin [bupropion]   History:  Past Medical History:  Diagnosis Date   Anxiety    Aortic stenosis    CHF (congestive heart failure) (HCC)    COPD (chronic obstructive pulmonary disease) (HCC)    Depression    Fibromyalgia    GERD (gastroesophageal reflux disease)    Hyperlipidemia    Hypertension    Hypertensive heart  disease    Myalgia    Obesity    Sciatica    Seborrheic keratosis    Stress incontinence    Past Surgical History:  Procedure Laterality Date   ABDOMINAL HYSTERECTOMY     partial   Family History  Problem Relation Age of Onset   Stroke Mother    Hypertension Mother    Heart disease Father    Diabetes Brother    Stroke Maternal Grandmother    Stroke Maternal Grandfather    Heart disease Paternal Grandmother    Heart disease Paternal Grandfather    Breast cancer Neg Hx    Social History   Socioeconomic History   Marital status: Married    Spouse name: Not on file   Number of children: Not on file   Years of education: Not on file   Highest education level: Not on file  Occupational History   Not on file  Tobacco Use   Smoking status: Former    Packs/day: 1.00    Years: 40.00    Pack years: 40.00    Types: Cigarettes    Quit date: 11/01/2016    Years since quitting: 3.9   Smokeless tobacco: Never   Tobacco comments:    Is trying cold-turkey with social support  Vaping Use   Vaping Use: Former  Substance and Sexual Activity   Alcohol use: No    Alcohol/week: 0.0 standard drinks   Drug use: No   Sexual activity: Not Currently  Other Topics Concern   Not on file  Social History Narrative   Not on file   Social Determinants of Health   Financial Resource Strain: Low Risk    Difficulty of Paying Living Expenses: Not hard at all  Food Insecurity: No Food Insecurity   Worried About Charity fundraiser in the Last Year: Never true   Ran Out of Food in the Last Year: Never true  Transportation Needs: No Transportation Needs   Lack of Transportation (Medical): No   Lack of Transportation (Non-Medical): No  Physical Activity: Inactive   Days of Exercise per Week: 0 days   Minutes of Exercise per Session: 0 min  Stress: Stress Concern Present   Feeling of Stress : Very much  Social Connections: Not on file    Tobacco Counseling Counseling given: Not  Answered Tobacco comments: Is trying cold-turkey with social support   Clinical Intake:  Pre-visit preparation completed: Yes  Pain : No/denies pain     Nutritional Status: BMI 25 -29 Overweight Nutritional Risks: Nausea/ vomitting/ diarrhea (nausea this morning) Diabetes: No  How often do you need to have someone help you when you read instructions, pamphlets, or other written materials from your doctor or pharmacy?: 1 - Never What is the last grade level you completed in school?: 12th grade  Diabetic? no  Interpreter Needed?: No  Information entered by :: NAllen Steele   Activities of Daily Living In your present state of health, do you have any difficulty performing the following activities: 10/19/2020 06/04/2020  Hearing? Y N  Vision? Y N  Comment wears readind glasses -  Difficulty concentrating or making decisions?  N N  Walking or climbing stairs? N N  Dressing or bathing? N N  Doing errands, shopping? N N  Preparing Food and eating ? N -  Using the Toilet? N -  In the past six months, have you accidently leaked urine? Y -  Comment with cough -  Do you have problems with loss of bowel control? N -  Managing your Medications? N -  Managing your Finances? N -  Housekeeping or managing your Housekeeping? N -  Some recent data might be hidden    Patient Care Team: Jon Billings, NP as PCP - General Corey Skains, MD as Consulting Physician (Cardiology)  Indicate any recent Medical Services you may have received from other than [facility] providers in the past year (date may be approximate).     Assessment:   This is a routine wellness examination for Brandi Steele.  Hearing/Vision screen No results found.  Dietary issues and exercise activities discussed: Current Exercise Habits: The patient does not participate in regular exercise at present   Goals Addressed             This Visit's Progress    Patient Stated       10/19/2020, wants to lose 25 pounds        Depression Screen PHQ 2/9 Scores 10/19/2020 06/04/2020 10/17/2019 09/07/2019 05/02/2019 11/17/2018 09/01/2018  PHQ - 2 Score 0 0 0 0 0 1 2  PHQ- 9 Score - 0 0 0 1 4 5     Fall Risk Fall Risk  10/19/2020 06/04/2020 10/17/2019 05/02/2019 09/01/2018  Falls in the past year? 1 0 0 0 0  Number falls in past yr: - 0 - 0 -  Injury with Fall? - 0 - 0 -  Risk for fall due to : Medication side effect No Fall Risks Medication side effect - -  Follow up Falls evaluation completed;Education provided;Falls prevention discussed Falls evaluation completed Falls evaluation completed;Education provided;Falls prevention discussed - Falls evaluation completed    FALL RISK PREVENTION PERTAINING TO THE HOME:  Any stairs in or around the home? No  If so, are there any without handrails?  N/a Home free of loose throw rugs in walkways, pet beds, electrical cords, etc? Yes  Adequate lighting in your home to reduce risk of falls? Yes   ASSISTIVE DEVICES UTILIZED TO PREVENT FALLS:  Life alert? No  Use of a cane, walker or w/c? No  Grab bars in the bathroom? Yes  Shower chair or bench in shower? No  Elevated toilet seat or a handicapped toilet? Yes   TIMED UP AND GO:  Was the test performed? No .  .     Cognitive Function:     6CIT Screen 10/17/2019  What Year? 0 points  What month? 0 points  What time? 0 points  Count back from 20 0 points  Months in reverse 0 points  Repeat phrase 0 points  Total Score 0    Immunizations Immunization History  Administered Date(s) Administered   Fluad Quad(high Dose 65+) 11/17/2018   Influenza,inj,Quad PF,6+ Mos 10/09/2015, 12/26/2016   Influenza-Unspecified 11/22/2017   Pneumococcal Conjugate-13 03/03/2019   Pneumococcal Polysaccharide-23 03/28/2009   Td 06/14/2004   Tdap 05/28/2015    TDAP status: Up to date  Flu Vaccine status: Declined, Education has been provided regarding the importance of this vaccine but patient still declined. Advised may receive  this vaccine at local pharmacy or Health Dept. Aware to provide a copy of the vaccination record if obtained  from local pharmacy or Health Dept. Verbalized acceptance and understanding.  Pneumococcal vaccine status: Up to date  Covid-19 vaccine status: Declined, Education has been provided regarding the importance of this vaccine but patient still declined. Advised may receive this vaccine at local pharmacy or Health Dept.or vaccine clinic. Aware to provide a copy of the vaccination record if obtained from local pharmacy or Health Dept. Verbalized acceptance and understanding.  Qualifies for Shingles Vaccine? Yes   Zostavax completed No   Shingrix Completed?: No.    Education has been provided regarding the importance of this vaccine. Patient has been advised to call insurance company to determine out of pocket expense if they have not yet received this vaccine. Advised may also receive vaccine at local pharmacy or Health Dept. Verbalized acceptance and understanding.  Screening Tests Health Maintenance  Topic Date Due   COVID-19 Vaccine (1) Never done   COLONOSCOPY (Pts 45-61yrs Insurance coverage will need to be confirmed)  Never done   MAMMOGRAM  09/05/2017   DEXA SCAN  Never done   INFLUENZA VACCINE  08/27/2020   Zoster Vaccines- Shingrix (1 of 2) 10/25/2020 (Originally 08/24/1974)   TETANUS/TDAP  05/27/2025   Hepatitis C Screening  Completed   HPV VACCINES  Aged Out    Health Maintenance  Health Maintenance Due  Topic Date Due   COVID-19 Vaccine (1) Never done   COLONOSCOPY (Pts 45-101yrs Insurance coverage will need to be confirmed)  Never done   MAMMOGRAM  09/05/2017   DEXA SCAN  Never done   INFLUENZA VACCINE  08/27/2020    Colorectal cancer screening: due  Mammogram status: scheduled 11/01/2020  Bone Density status: scheduled 11/01/2020  Lung Cancer Screening: (Low Dose CT Chest recommended if Age 42-80 years, 30 pack-year currently smoking OR have quit w/in 15years.)  does qualify.   Lung Cancer Screening Referral: CT scan 03/22/2020  Additional Screening:  Hepatitis C Screening: does qualify; Completed 05/28/2015  Vision Screening: Recommended annual ophthalmology exams for early detection of glaucoma and other disorders of the eye. Is the patient up to date with their annual eye exam?  No  Who is the provider or what is the name of the office in which the patient attends annual eye exams? none If pt is not established with a provider, would they like to be referred to a provider to establish care? No .   Dental Screening: Recommended annual dental exams for proper oral hygiene  Community Resource Referral / Chronic Care Management: CRR required this visit?  No   CCM required this visit?  No      Plan:     I have personally reviewed and noted the following in the patient's chart:   Medical and social history Use of alcohol, tobacco or illicit drugs  Current medications and supplements including opioid prescriptions.  Functional ability and status Nutritional status Physical activity Advanced directives List of other physicians Hospitalizations, surgeries, and ER visits in previous 12 months Vitals Screenings to include cognitive, depression, and falls Referrals and appointments  In addition, I have reviewed and discussed with patient certain preventive protocols, quality metrics, and best practice recommendations. A written personalized care plan for preventive services as well as general preventive health recommendations were provided to patient.     Kellie Simmering, Steele   4/92/0100   Nurse Notes: 6 CIT not administered. Had a bad connection and was having difficulty hearing. Appeared cognitive per conversation.

## 2020-10-19 NOTE — Patient Instructions (Signed)
[name] , Thank you for taking time to come for your Medicare Wellness Visit. I appreciate your ongoing commitment to your health goals. Please review the following plan we discussed and let me know if I can assist you in the future.   Screening recommendations/referrals: Colonoscopy: due Mammogram: scheduled 11/01/2020 Bone Density: scheduled 11/01/2020 Recommended yearly ophthalmology/optometry visit for glaucoma screening and checkup Recommended yearly dental visit for hygiene and checkup  Vaccinations: Influenza vaccine: decline Pneumococcal vaccine: completed 03/03/2019 Tdap vaccine: completed 05/28/2015, due 05/27/2025 Shingles vaccine: discussed   Covid-19: decline  Advanced directives: Advance directive discussed with you today.   Conditions/risks identified: none  Next appointment: Follow up in one year for your annual wellness visit    Preventive Care 65 Years and Older, Female Preventive care refers to lifestyle choices and visits with your health care provider that can promote health and wellness. What does preventive care include? A yearly physical exam. This is also called an annual well check. Dental exams once or twice a year. Routine eye exams. Ask your health care provider how often you should have your eyes checked. Personal lifestyle choices, including: Daily care of your teeth and gums. Regular physical activity. Eating a healthy diet. Avoiding tobacco and drug use. Limiting alcohol use. Practicing safe sex. Taking low-dose aspirin every day. Taking vitamin and mineral supplements as recommended by your health care provider. What happens during an annual well check? The services and screenings done by your health care provider during your annual well check will depend on your age, overall health, lifestyle risk factors, and family history of disease. Counseling  Your health care provider may ask you questions about your: Alcohol use. Tobacco use. Drug  use. Emotional well-being. Home and relationship well-being. Sexual activity. Eating habits. History of falls. Memory and ability to understand (cognition). Work and work Statistician. Reproductive health. Screening  You may have the following tests or measurements: Height, weight, and BMI. Blood pressure. Lipid and cholesterol levels. These may be checked every 5 years, or more frequently if you are over 34 years old. Skin check. Lung cancer screening. You may have this screening every year starting at age 57 if you have a 30-pack-year history of smoking and currently smoke or have quit within the past 15 years. Fecal occult blood test (FOBT) of the stool. You may have this test every year starting at age 19. Flexible sigmoidoscopy or colonoscopy. You may have a sigmoidoscopy every 5 years or a colonoscopy every 10 years starting at age 53. Hepatitis C blood test. Hepatitis B blood test. Sexually transmitted disease (STD) testing. Diabetes screening. This is done by checking your blood sugar (glucose) after you have not eaten for a while (fasting). You may have this done every 1-3 years. Bone density scan. This is done to screen for osteoporosis. You may have this done starting at age 19. Mammogram. This may be done every 1-2 years. Talk to your health care provider about how often you should have regular mammograms. Talk with your health care provider about your test results, treatment options, and if necessary, the need for more tests. Vaccines  Your health care provider may recommend certain vaccines, such as: Influenza vaccine. This is recommended every year. Tetanus, diphtheria, and acellular pertussis (Tdap, Td) vaccine. You may need a Td booster every 10 years. Zoster vaccine. You may need this after age 19. Pneumococcal 13-valent conjugate (PCV13) vaccine. One dose is recommended after age 67. Pneumococcal polysaccharide (PPSV23) vaccine. One dose is recommended after age  67. Talk to your health care provider about which screenings and vaccines you need and how often you need them. This information is not intended to replace advice given to you by your health care provider. Make sure you discuss any questions you have with your health care provider. Document Released: 02/09/2015 Document Revised: 10/03/2015 Document Reviewed: 11/14/2014 Elsevier Interactive Patient Education  2017 [place] Prevention in the Home Falls can cause injuries. They can happen to people of all ages. There are many things you can do to make your home safe and to help prevent falls. What can I do on the outside of my home? Regularly fix the edges of walkways and driveways and fix any cracks. Remove anything that might make you trip as you walk through a door, such as a raised step or threshold. Trim any bushes or trees on the path to your home. Use bright outdoor lighting. Clear any walking paths of anything that might make someone trip, such as rocks or tools. Regularly check to see if handrails are loose or broken. Make sure that both sides of any steps have handrails. Any raised decks and porches should have guardrails on the edges. Have any leaves, snow, or ice cleared regularly. Use sand or salt on walking paths during winter. Clean up any spills in your garage right away. This includes oil or grease spills. What can I do in the bathroom? Use night lights. Install grab bars by the toilet and in the tub and shower. Do not use towel bars as grab bars. Use non-skid mats or decals in the tub or shower. If you need to sit down in the shower, use a plastic, non-slip stool. Keep the floor dry. Clean up any water that spills on the floor as soon as it happens. Remove soap buildup in the tub or shower regularly. Attach bath mats securely with double-sided non-slip rug tape. Do not have throw rugs and other things on the floor that can make you trip. What can I do in the  bedroom? Use night lights. Make sure that you have a light by your bed that is easy to reach. Do not use any sheets or blankets that are too big for your bed. They should not hang down onto the floor. Have a firm chair that has side arms. You can use this for support while you get dressed. Do not have throw rugs and other things on the floor that can make you trip. What can I do in the kitchen? Clean up any spills right away. Avoid walking on wet floors. Keep items that you use a lot in easy-to-reach places. If you need to reach something above you, use a strong step stool that has a grab bar. Keep electrical cords out of the way. Do not use floor polish or wax that makes floors slippery. If you must use wax, use non-skid floor wax. Do not have throw rugs and other things on the floor that can make you trip. What can I do with my stairs? Do not leave any items on the stairs. Make sure that there are handrails on both sides of the stairs and use them. Fix handrails that are broken or loose. Make sure that handrails are as long as the stairways. Check any carpeting to make sure that it is firmly attached to the stairs. Fix any carpet that is loose or worn. Avoid having throw rugs at the top or bottom of the stairs. If you do have throw  rugs, attach them to the floor with carpet tape. Make sure that you have a light switch at the top of the stairs and the bottom of the stairs. If you do not have them, ask someone to add them for you. What else can I do to help prevent falls? Wear shoes that: Do not have high heels. Have rubber bottoms. Are comfortable and fit you well. Are closed at the toe. Do not wear sandals. If you use a stepladder: Make sure that it is fully opened. Do not climb a closed stepladder. Make sure that both sides of the stepladder are locked into place. Ask someone to hold it for you, if possible. Clearly mark and make sure that you can see: Any grab bars or  handrails. First and last steps. Where the edge of each step is. Use tools that help you move around (mobility aids) if they are needed. These include: Canes. Walkers. Scooters. Crutches. Turn on the lights when you go into a dark area. Replace any light bulbs as soon as they burn out. Set up your furniture so you have a clear path. Avoid moving your furniture around. If any of your floors are uneven, fix them. If there are any pets around you, be aware of where they are. Review your medicines with your doctor. Some medicines can make you feel dizzy. This can increase your chance of falling. Ask your doctor what other things that you can do to help prevent falls. This information is not intended to replace advice given to you by your health care provider. Make sure you discuss any questions you have with your health care provider. Document Released: 11/09/2008 Document Revised: 06/21/2015 Document Reviewed: 02/17/2014 Elsevier Interactive Patient Education  2017 [employer].

## 2020-10-30 ENCOUNTER — Telehealth: Payer: Self-pay

## 2020-10-30 NOTE — Telephone Encounter (Signed)
Copied from [place] [id number]. Topic: General - Other >> Oct 29, 2020  3:38 PM Tessa Lerner A wrote: Reason for CRM: The patient would like to speak to a member of clinical staff about their colon cancer screening  The patient shares that they received an automated call directing them to contact their PCP  Please contact further when available

## 2020-10-30 NOTE — Telephone Encounter (Signed)
Called patient and left VM for patient to call back to discuss the 3 options of colonoscopy referral, Fecal Occult Test, or Cologuard.

## 2020-11-01 ENCOUNTER — Ambulatory Visit
Admission: RE | Admit: 2020-11-01 | Discharge: 2020-11-01 | Disposition: A | Discharge status: Home / Self Care | Payer: Medicare Other | Source: Ambulatory Visit | Attending: Nurse Practitioner | Admitting: Nurse Practitioner

## 2020-11-01 ENCOUNTER — Other Ambulatory Visit: Payer: Self-pay

## 2020-11-01 DIAGNOSIS — Z1231 Encounter for screening mammogram for malignant neoplasm of breast: Secondary | ICD-10-CM

## 2020-11-01 DIAGNOSIS — Z13820 Encounter for screening for osteoporosis: Secondary | ICD-10-CM | POA: Diagnosis present

## 2020-11-01 DIAGNOSIS — Z780 Asymptomatic menopausal state: Secondary | ICD-10-CM | POA: Diagnosis not present

## 2020-11-01 DIAGNOSIS — M8589 Other specified disorders of bone density and structure, multiple sites: Secondary | ICD-10-CM | POA: Insufficient documentation

## 2020-11-05 NOTE — Progress Notes (Signed)
Please let patient know her Mammogram did not show any evidence of a malignancy.  The recommendation is to repeat the Mammogram in 1 year.  

## 2020-11-05 NOTE — Progress Notes (Signed)
Please let patient know that her bone density scan shows that she has osteopenia.  I recommend she take a vitamin d and calcium supplement as well as increase her weight bearing exercises to help prevent fractures.  Please let me know if she has any questions.

## 2020-12-04 NOTE — Progress Notes (Signed)
BP (!) 114/49   Pulse 60   Temp 98.9 F (37.2 C) (Oral)   Ht 5\' 5"  (1.651 m)   Wt 183 lb 9.6 oz (83.3 kg)   LMP  (LMP Unknown)   SpO2 94%   BMI 30.55 kg/m    Subjective:    Patient ID: Brandi Steele, female    DOB: October 27, 1955, 65 y.o.   MRN: [id number]  HPI: Brandi Steele is a 65 y.o. female presenting on 12/05/2020 for comprehensive medical examination. Current medical complaints include: Patient is having trouble caring for her husband and her brother who are both medically impaired.   She currently lives with: Menopausal Symptoms: no  HYPERTENSION / HYPERLIPIDEMIA Satisfied with current treatment? yes Duration of hypertension: years BP monitoring frequency: not checking BP range:  BP medication side effects: no Past BP meds: lisinopril Duration of hyperlipidemia: years Cholesterol medication side effects: no Cholesterol supplements: none Past cholesterol medications: rosuvastatin (crestor) Medication compliance: excellent compliance Aspirin: no Recent stressors: no Recurrent headaches: no Visual changes: no Palpitations: no Dyspnea: no Chest pain: no Lower extremity edema: no Dizzy/lightheaded: no  COPD COPD status: controlled Satisfied with current treatment?: yes Oxygen use: no Dyspnea frequency:  Cough frequency:  Rescue inhaler frequency:   Limitation of activity: no Productive cough:  Last Spirometry:  Pneumovax: Not up to Date Influenza: Not up to Date  CAREGIVER STRAIN Patient is very stressed having to take care of her husband and brother. Patient states she has tried several antidepressants including Celexa, Wellbutrin, Zoloft, and Effexor.  She fees like she doesn't need something daily just someone as needed to help with the stress.  She felt numb taking Zoloft and had a difficult time coming off of the medication.   Depression Screen done today and results listed below:  Depression screen [facility] 2/9 10/19/2020 06/04/2020 10/17/2019 09/07/2019  05/02/2019  Decreased Interest 0 0 0 0 0  Down, Depressed, Hopeless 0 0 0 0 0  PHQ - 2 Score 0 0 0 0 0  Altered sleeping - 0 0 0 0  Tired, decreased energy - 0 0 0 1  Change in appetite - 0 0 0 0  Feeling bad or failure about yourself  - 0 0 0 0  Trouble concentrating - 0 0 0 0  Moving slowly or fidgety/restless - 0 0 0 0  Suicidal thoughts - 0 0 0 0  PHQ-9 Score - 0 0 0 1  Difficult doing work/chores - Not difficult at all - - -  Some recent data might be hidden    The patient does not have a history of falls. I did complete a risk assessment for falls. A plan of care for falls was documented.   Past Medical History:  Past Medical History:  Diagnosis Date   Anxiety    Aortic stenosis    CHF (congestive heart failure) (HCC)    COPD (chronic obstructive pulmonary disease) (HCC)    Depression    Fibromyalgia    GERD (gastroesophageal reflux disease)    Hyperlipidemia    Hypertension    Hypertensive heart disease    Myalgia    Obesity    Sciatica    Seborrheic keratosis    Stress incontinence     Surgical History:  Past Surgical History:  Procedure Laterality Date   TOTAL ABDOMINAL HYSTERECTOMY     partial    Medications:  Current Outpatient Medications on File Prior to Visit  Medication Sig   albuterol (VENTOLIN HFA)  108 (90 Base) MCG/ACT inhaler INHALE 2 PUFFS INTO THE LUNGS EVERY 6 HOURS AS NEEDED FOR WHEEZING OR SHORTNESS OF BREATH   Calcium Carbonate-Vit D-Min (CALCIUM 1200 PO) Take by mouth daily.   cetirizine (ZYRTEC) 10 MG tablet Take 1 tablet (10 mg total) by mouth daily.   clopidogrel (PLAVIX) 75 MG tablet TAKE 1 TABLET(75 MG) BY MOUTH DAILY   cyclobenzaprine (FLEXERIL) 10 MG tablet Take 1 tablet (10 mg total) by mouth 3 (three) times daily as needed for muscle spasms (for firbromyalgia.).   furosemide (LASIX) 40 MG tablet Take 1 tablet (40 mg total) by mouth daily as needed.   lisinopril (ZESTRIL) 2.5 MG tablet TAKE 1 TABLET(2.5 MG) BY MOUTH DAILY    Magnesium 400 MG CAPS Take by mouth. Take 6 tablets daily   Potassium Gluconate 595 MG CAPS Take by mouth. Take 4 capsules daily   rosuvastatin (CRESTOR) 10 MG tablet TAKE 1 TABLET(10 MG) BY MOUTH DAILY   triamcinolone cream (KENALOG) 0.1 % Apply 1 application 2 (two) times daily topically.   No current facility-administered medications on file prior to visit.    Allergies:  Allergies  Allergen Reactions   Morphine And Related Nausea And Vomiting   Celexa [Citalopram Hydrobromide] Other (See Comments)    Pruritis    Effexor [Venlafaxine] Other (See Comments)    Panic attack   Lipitor [Atorvastatin] Other (See Comments)    Myalgias   Shellfish Allergy Hives and Swelling   Wellbutrin [Bupropion] Anxiety    Social History:  Social History   Socioeconomic History   Marital status: Married    Spouse name: Not on file   Number of children: Not on file   Years of education: Not on file   Highest education level: Not on file  Occupational History   Not on file  Tobacco Use   Smoking status: Former    Packs/day: 1.00    Years: 40.00    Pack years: 40.00    Types: Cigarettes    Quit date: 11/01/2016    Years since quitting: 4.0   Smokeless tobacco: Never   Tobacco comments:    Is trying cold-turkey with social support  Vaping Use   Vaping Use: Former  Substance and Sexual Activity   Alcohol use: No    Alcohol/week: 0.0 standard drinks   Drug use: No   Sexual activity: Not Currently  Other Topics Concern   Not on file  Social History Narrative   Not on file   Social Determinants of Health   Financial Resource Strain: Low Risk    Difficulty of Paying Living Expenses: Not hard at all  Food Insecurity: No Food Insecurity   Worried About Charity fundraiser in the Last Year: Never true   Ran Out of Food in the Last Year: Never true  Transportation Needs: No Transportation Needs   Lack of Transportation (Medical): No   Lack of Transportation (Non-Medical): No   Physical Activity: Inactive   Days of Exercise per Week: 0 days   Minutes of Exercise per Session: 0 min  Stress: Stress Concern Present   Feeling of Stress : Very much  Social Connections: Not on file  Intimate Partner Violence: Not on file   Social History   Tobacco Use  Smoking Status Former   Packs/day: 1.00   Years: 40.00   Pack years: 40.00   Types: Cigarettes   Quit date: 11/01/2016   Years since quitting: 4.0  Smokeless Tobacco Never  Tobacco Comments  Is trying cold-turkey with social support   Social History   Substance and Sexual Activity  Alcohol Use No   Alcohol/week: 0.0 standard drinks    Family History:  Family History  Problem Relation Age of Onset   Stroke Mother    Hypertension Mother    Heart disease Father    Diabetes Brother    Stroke Maternal Grandmother    Stroke Maternal Grandfather    Heart disease Paternal Grandmother    Heart disease Paternal Grandfather    Breast cancer Neg Hx     Past medical history, surgical history, medications, allergies, family history and social history reviewed with patient today and changes made to appropriate areas of the chart.   Review of Systems  Eyes:  Negative for blurred vision and double vision.  Respiratory:  Negative for shortness of breath.   Cardiovascular:  Negative for chest pain, palpitations and leg swelling.  Neurological:  Negative for dizziness and headaches.  Psychiatric/Behavioral:  Positive for depression. The patient is nervous/anxious.   All other ROS negative except what is listed above and in the HPI.      Objective:    BP (!) 114/49   Pulse 60   Temp 98.9 F (37.2 C) (Oral)   Ht 5\' 5"  (1.651 m)   Wt 183 lb 9.6 oz (83.3 kg)   LMP  (LMP Unknown)   SpO2 94%   BMI 30.55 kg/m   Wt Readings from Last 3 Encounters:  12/05/20 183 lb 9.6 oz (83.3 kg)  10/19/20 178 lb (80.7 kg)  07/25/20 183 lb 9.6 oz (83.3 kg)    Physical Exam Vitals and nursing note reviewed.   Constitutional:      General: She is awake. She is not in acute distress.    Appearance: She is well-developed. She is not ill-appearing.  HENT:     Head: Normocephalic and atraumatic.     Right Ear: Hearing, tympanic membrane, ear canal and external ear normal. No drainage.     Left Ear: Hearing, tympanic membrane, ear canal and external ear normal. No drainage.     Nose: Nose normal.     Right Sinus: No maxillary sinus tenderness or frontal sinus tenderness.     Left Sinus: No maxillary sinus tenderness or frontal sinus tenderness.     Mouth/Throat:     Mouth: Mucous membranes are moist.     Pharynx: Oropharynx is clear. Uvula midline. No pharyngeal swelling, oropharyngeal exudate or posterior oropharyngeal erythema.  Eyes:     General: Lids are normal.        Right eye: No discharge.        Left eye: No discharge.     Extraocular Movements: Extraocular movements intact.     Conjunctiva/sclera: Conjunctivae normal.     Pupils: Pupils are equal, round, and reactive to light.     Visual Fields: Right eye visual fields normal and left eye visual fields normal.  Neck:     Thyroid: No thyromegaly.     Vascular: No carotid bruit.     Trachea: Trachea normal.  Cardiovascular:     Rate and Rhythm: Normal rate and regular rhythm.     Heart sounds: Normal heart sounds. No murmur heard.   No gallop.  Pulmonary:     Effort: Pulmonary effort is normal. No accessory muscle usage or respiratory distress.     Breath sounds: Normal breath sounds.  Chest:  Breasts:    Right: Normal.     Left: Normal.  Abdominal:     General: Bowel sounds are normal.     Palpations: Abdomen is soft. There is no hepatomegaly or splenomegaly.     Tenderness: There is no abdominal tenderness.  Musculoskeletal:        General: Normal range of motion.     Cervical back: Normal range of motion and neck supple.     Right lower leg: No edema.     Left lower leg: No edema.  Lymphadenopathy:     Head:     Right  side of head: No submental, submandibular, tonsillar, preauricular or posterior auricular adenopathy.     Left side of head: No submental, submandibular, tonsillar, preauricular or posterior auricular adenopathy.     Cervical: No cervical adenopathy.     Upper Body:     Right upper body: No supraclavicular, axillary or pectoral adenopathy.     Left upper body: No supraclavicular, axillary or pectoral adenopathy.  Skin:    General: Skin is warm and dry.     Capillary Refill: Capillary refill takes less than 2 seconds.     Findings: No rash.  Neurological:     Mental Status: She is alert and oriented to person, place, and time.     Gait: Gait is intact.     Deep Tendon Reflexes: Reflexes are normal and symmetric.     Reflex Scores:      Brachioradialis reflexes are 2+ on the right side and 2+ on the left side.      Patellar reflexes are 2+ on the right side and 2+ on the left side. Psychiatric:        Attention and Perception: Attention normal.        Mood and Affect: Mood normal.        Speech: Speech normal.        Behavior: Behavior normal. Behavior is cooperative.        Thought Content: Thought content normal.        Judgment: Judgment normal.    Results for orders placed or performed in visit on 07/25/20  Microscopic Examination   Urine  Result Value Ref Range   WBC, UA None seen 0 - 5 /hpf   RBC 0-2 0 - 2 /hpf   Epithelial Cells (non renal) 0-10 0 - 10 /hpf   Bacteria, UA None seen None seen/Few  Urinalysis, Routine w reflex microscopic  Result Value Ref Range   Specific Gravity, UA 1.010 1.005 - 1.030   pH, UA 7.0 5.0 - 7.5   Color, UA Yellow Yellow   Appearance Ur Hazy (A) Clear   Leukocytes,UA Negative Negative   Protein,UA Negative Negative/Trace   Glucose, UA Negative Negative   Ketones, UA Negative Negative   RBC, UA Trace (A) Negative   Bilirubin, UA Negative Negative   Urobilinogen, Ur 0.2 0.2 - 1.0 mg/dL   Nitrite, UA Negative Negative   Microscopic  Examination See below:       Assessment & Plan:   Problem List Items Addressed This Visit       Cardiovascular and Mediastinum   CHF (congestive heart failure) (HCC)    Chronic, euvolemic today.  Continue collaboration with cardiology.  Her last echo was in March 2018 with EF 55%.  Continue current medication regimen and adjust as needed.        Essential hypertension    Chronic.  Controlled.  Continue with current medication regimen on Lisinopril 2.5mg  daily.  Labs ordered today.  Return to clinic  in 6 months for reevaluation.  Call sooner if concerns arise.        Aortic atherosclerosis (HCC)    Stable.  Refills given today. Found on CT January 2019.  On Plavix and Crestor.         Respiratory   Centrilobular emphysema (HCC)    Chronic.  Controlled.  Continue with current medication regimen on Symbicort.  Labs ordered today.  Return to clinic in 6 months for reevaluation.  Call sooner if concerns arise.        Relevant Medications   budesonide-formoterol (SYMBICORT) 160-4.5 MCG/ACT inhaler     Endocrine   IFG (impaired fasting glucose)    Labs ordered today.  Will make recommendations based on lab results.         Other   Hyperlipidemia    Chronic.  Controlled.  Continue with current medication regimen on Crestor 10mg .  Labs ordered today.  Return to clinic in 6 months for reevaluation.  Call sooner if concerns arise.        Caregiver role strain    Ongoing.  Not well controlled.  Patient has tried several antidepressants in the past.  States they made her feel numb. She had taken Xanax in the past which worked well to settle her nerves.  Discussed how to properly use medication.  Discussed risks of taking long term Benzo.  Patient understands the risks and okay with continuing medication.  Discussed getting out of the house and doing something for herself.  Follow up in 1 month for reevaluation.        Other Visit Diagnoses     Annual physical exam    -  Primary    Health maintenance reviewed during visit today.  Labs ordered. Declined pneumonia, flu and colonoscopy. Up to date on Mammogram.   Relevant Orders   CBC with Differential/Platelet   Comprehensive metabolic panel   Lipid panel   TSH   Urinalysis, Routine w reflex microscopic   Chronic obstructive pulmonary disease, unspecified COPD type (HCC)       Relevant Medications   budesonide-formoterol (SYMBICORT) 160-4.5 MCG/ACT inhaler   Low magnesium level       Relevant Orders   Magnesium        Follow up plan: Return in about 1 month (around 01/04/2021) for Burnout.   LABORATORY TESTING:  - Pap smear: not applicable  IMMUNIZATIONS:   - Tdap: Tetanus vaccination status reviewed: last tetanus booster within 10 years. - Influenza: Refused - Pneumovax: Refused - Prevnar: Up to date - HPV: Not applicable - Zostavax vaccine: Refused  SCREENING: -Mammogram: Up to date  - Colonoscopy:  not able to get a colonoscopy right now and Cologaurd is not covered per patient.   - Bone Density: Up to date  -Hearing Test: Not applicable  -Spirometry: Not applicable   PATIENT COUNSELING:   Advised to take 1 mg of folate supplement per day if capable of pregnancy.   Sexuality: Discussed sexually transmitted diseases, partner selection, use of condoms, avoidance of unintended pregnancy  and contraceptive alternatives.   Advised to avoid cigarette smoking.  I discussed with the patient that most people either abstain from alcohol or drink within safe limits (<=14/week and <=4 drinks/occasion for males, <=7/weeks and <= 3 drinks/occasion for females) and that the risk for alcohol disorders and other health effects rises proportionally with the number of drinks per week and how often a drinker exceeds daily limits.  Discussed cessation/primary prevention of drug use and  availability of treatment for abuse.   Diet: Encouraged to adjust caloric intake to maintain  or achieve ideal body weight, to  reduce intake of dietary saturated fat and total fat, to limit sodium intake by avoiding high sodium foods and not adding table salt, and to maintain adequate dietary potassium and calcium preferably from fresh fruits, vegetables, and low-fat dairy products.    stressed the importance of regular exercise  Injury prevention: Discussed safety belts, safety helmets, smoke detector, smoking near bedding or upholstery.   Dental health: Discussed importance of regular tooth brushing, flossing, and dental visits.    NEXT PREVENTATIVE PHYSICAL DUE IN 1 YEAR. Return in about 1 month (around 01/04/2021) for Burnout.

## 2020-12-05 ENCOUNTER — Other Ambulatory Visit: Payer: Self-pay

## 2020-12-05 ENCOUNTER — Ambulatory Visit (INDEPENDENT_AMBULATORY_CARE_PROVIDER_SITE_OTHER): Payer: Medicare Other | Admitting: Nurse Practitioner

## 2020-12-05 ENCOUNTER — Encounter: Payer: Self-pay | Admitting: Nurse Practitioner

## 2020-12-05 VITALS — BP 114/49 | HR 60 | Temp 98.9°F | Ht 65.0 in | Wt 183.6 lb

## 2020-12-05 DIAGNOSIS — Z0000 Encounter for general adult medical examination without abnormal findings: Secondary | ICD-10-CM

## 2020-12-05 DIAGNOSIS — J449 Chronic obstructive pulmonary disease, unspecified: Secondary | ICD-10-CM

## 2020-12-05 DIAGNOSIS — I700 Atherosclerosis of aorta: Secondary | ICD-10-CM | POA: Diagnosis not present

## 2020-12-05 DIAGNOSIS — R790 Abnormal level of blood mineral: Secondary | ICD-10-CM

## 2020-12-05 DIAGNOSIS — I509 Heart failure, unspecified: Secondary | ICD-10-CM | POA: Diagnosis not present

## 2020-12-05 DIAGNOSIS — R7301 Impaired fasting glucose: Secondary | ICD-10-CM

## 2020-12-05 DIAGNOSIS — E782 Mixed hyperlipidemia: Secondary | ICD-10-CM

## 2020-12-05 DIAGNOSIS — J432 Centrilobular emphysema: Secondary | ICD-10-CM | POA: Diagnosis not present

## 2020-12-05 DIAGNOSIS — I10 Essential (primary) hypertension: Secondary | ICD-10-CM

## 2020-12-05 DIAGNOSIS — Z638 Other specified problems related to primary support group: Secondary | ICD-10-CM | POA: Insufficient documentation

## 2020-12-05 HISTORY — DX: Other specified problems related to primary support group: Z63.8

## 2020-12-05 LAB — URINALYSIS, ROUTINE W REFLEX MICROSCOPIC
Bilirubin, UA: NEGATIVE
Glucose, UA: NEGATIVE
Ketones, UA: NEGATIVE
Leukocytes,UA: NEGATIVE
Nitrite, UA: NEGATIVE
Protein,UA: NEGATIVE
Specific Gravity, UA: 1.015 (ref 1.005–1.030)
Urobilinogen, Ur: 0.2 mg/dL (ref 0.2–1.0)
pH, UA: 8 — ABNORMAL HIGH (ref 5.0–7.5)

## 2020-12-05 LAB — MICROSCOPIC EXAMINATION
Bacteria, UA: NONE SEEN
Epithelial Cells (non renal): NONE SEEN /hpf (ref 0–10)
WBC, UA: NONE SEEN /hpf (ref 0–5)

## 2020-12-05 MED ORDER — HYDROXYZINE HCL 25 MG PO TABS: 25.0000 mg | ORAL_TABLET | Freq: Three times a day (TID) | ORAL | 1 refills | Status: DC | PRN

## 2020-12-05 MED ORDER — MELOXICAM 15 MG PO TABS: ORAL_TABLET | ORAL | 3 refills | Status: DC

## 2020-12-05 MED ORDER — ALPRAZOLAM 0.5 MG PO TABS: 0.5000 mg | ORAL_TABLET | Freq: Two times a day (BID) | ORAL | 0 refills | Status: DC | PRN

## 2020-12-05 MED ORDER — BUDESONIDE-FORMOTEROL FUMARATE 160-4.5 MCG/ACT IN AERO
2.0000 | INHALATION_SPRAY | Freq: Two times a day (BID) | RESPIRATORY_TRACT | 5 refills | Status: DC
Start: 2020-12-05 — End: 2022-05-27

## 2020-12-05 MED ORDER — ESOMEPRAZOLE MAGNESIUM 40 MG PO CPDR: DELAYED_RELEASE_CAPSULE | ORAL | 2 refills | Status: DC

## 2020-12-05 NOTE — Assessment & Plan Note (Signed)
Chronic, euvolemic today.  Continue collaboration with cardiology.  Her last echo was in March 2018 with EF 55%.  Continue current medication regimen and adjust as needed.

## 2020-12-05 NOTE — Assessment & Plan Note (Signed)
Chronic.  Controlled.  Continue with current medication regimen on Symbicort.  Labs ordered today.  Return to clinic in 6 months for reevaluation.  Call sooner if concerns arise.

## 2020-12-05 NOTE — Assessment & Plan Note (Signed)
Labs ordered today.  Will make recommendations based on lab results. ?

## 2020-12-05 NOTE — Assessment & Plan Note (Signed)
Ongoing.  Not well controlled.  Patient has tried several antidepressants in the past.  States they made her feel numb. She had taken Xanax in the past which worked well to settle her nerves.  Discussed how to properly use medication.  Discussed risks of taking long term Benzo.  Patient understands the risks and okay with continuing medication.  Discussed getting out of the house and doing something for herself.  Follow up in 1 month for reevaluation.

## 2020-12-05 NOTE — Assessment & Plan Note (Signed)
Stable.  Refills given today. Found on CT January 2019.  On Plavix and Crestor.

## 2020-12-05 NOTE — Assessment & Plan Note (Signed)
Chronic.  Controlled.  Continue with current medication regimen on Lisinopril 2.5mg  daily.  Labs ordered today.  Return to clinic in 6 months for reevaluation.  Call sooner if concerns arise.

## 2020-12-05 NOTE — Assessment & Plan Note (Signed)
Chronic.  Controlled.  Continue with current medication regimen on Crestor 10mg .  Labs ordered today.  Return to clinic in 6 months for reevaluation.  Call sooner if concerns arise.

## 2020-12-06 LAB — CBC WITH DIFFERENTIAL/PLATELET
Basophils Absolute: 0.1 10*3/uL (ref 0.0–0.2)
Basos: 1 %
EOS (ABSOLUTE): 0.1 10*3/uL (ref 0.0–0.4)
Eos: 2 %
Hematocrit: 43.1 % (ref 34.0–46.6)
Hemoglobin: 13.9 g/dL (ref 11.1–15.9)
Immature Grans (Abs): 0 10*3/uL (ref 0.0–0.1)
Immature Granulocytes: 0 %
Lymphocytes Absolute: 1.4 10*3/uL (ref 0.7–3.1)
Lymphs: 23 %
MCH: 29.2 pg (ref 26.6–33.0)
MCHC: 32.3 g/dL (ref 31.5–35.7)
MCV: 91 fL (ref 79–97)
Monocytes Absolute: 0.5 10*3/uL (ref 0.1–0.9)
Monocytes: 9 %
Neutrophils Absolute: 4 10*3/uL (ref 1.4–7.0)
Neutrophils: 65 %
Platelets: 164 10*3/uL (ref 150–450)
RBC: 4.76 x10E6/uL (ref 3.77–5.28)
RDW: 13.5 % (ref 11.7–15.4)
WBC: 6.2 10*3/uL (ref 3.4–10.8)

## 2020-12-06 LAB — COMPREHENSIVE METABOLIC PANEL
ALT: 14 IU/L (ref 0–32)
AST: 20 IU/L (ref 0–40)
Albumin/Globulin Ratio: 2.1 (ref 1.2–2.2)
Albumin: 4.8 g/dL (ref 3.8–4.8)
Alkaline Phosphatase: 55 IU/L (ref 44–121)
BUN/Creatinine Ratio: 21 (ref 12–28)
BUN: 16 mg/dL (ref 8–27)
Bilirubin Total: 0.2 mg/dL (ref 0.0–1.2)
CO2: 26 mmol/L (ref 20–29)
Calcium: 9.4 mg/dL (ref 8.7–10.3)
Chloride: 102 mmol/L (ref 96–106)
Creatinine, Ser: 0.75 mg/dL (ref 0.57–1.00)
Globulin, Total: 2.3 g/dL (ref 1.5–4.5)
Glucose: 90 mg/dL (ref 70–99)
Potassium: 4.8 mmol/L (ref 3.5–5.2)
Sodium: 141 mmol/L (ref 134–144)
Total Protein: 7.1 g/dL (ref 6.0–8.5)
eGFR: 87 mL/min/{1.73_m2} (ref >59)

## 2020-12-06 LAB — LIPID PANEL
Chol/HDL Ratio: 3.8 ratio (ref 0.0–4.4)
Cholesterol, Total: 165 mg/dL (ref 100–199)
HDL: 44 mg/dL (ref >39)
LDL Chol Calc (NIH): 97 mg/dL (ref 0–99)
Triglycerides: 137 mg/dL (ref 0–149)
VLDL Cholesterol Cal: 24 mg/dL (ref 5–40)

## 2020-12-06 LAB — TSH: TSH: 1.62 u[IU]/mL (ref 0.450–4.500)

## 2020-12-06 LAB — MAGNESIUM: Magnesium: 2.1 mg/dL (ref 1.6–2.3)

## 2020-12-06 NOTE — Progress Notes (Signed)
Please let patient know that her lab work looks good. Magnesium is within normal range.  Thyroid, Cholesterol, Liver, kidneys, electrolytes, and complete blood count are within normal range. Continue with current medication regimen.  Increase water intake and greeny leafy vegetables to help with constipation.  We will see how its going in 1 month.

## 2021-01-01 ENCOUNTER — Other Ambulatory Visit: Payer: Self-pay | Admitting: Nurse Practitioner

## 2021-01-01 NOTE — Telephone Encounter (Signed)
Requested Prescriptions  Pending Prescriptions Disp Refills  . lisinopril (ZESTRIL) 2.5 MG tablet [Pharmacy Med Name: LISINOPRIL 2.5MG  TABLETS] 90 tablet 0    Sig: TAKE 1 TABLET(2.5 MG) BY MOUTH DAILY     Cardiovascular:  ACE Inhibitors Failed - 01/01/2021  7:11 AM      Failed - Last BP in normal range    BP Readings from Last 1 Encounters:  12/05/20 (!) 114/49         Passed - Cr in normal range and within 180 days    Creatinine  Date Value Ref Range Status  06/16/2013 0.63 0.60 - 1.30 mg/dL Final   Creatinine, Ser  Date Value Ref Range Status  12/05/2020 0.75 0.57 - 1.00 mg/dL Final         Passed - K in normal range and within 180 days    Potassium  Date Value Ref Range Status  12/05/2020 4.8 3.5 - 5.2 mmol/L Final  06/16/2013 4.0 3.5 - 5.1 mmol/L Final         Passed - Patient is not pregnant      Passed - Valid encounter within last 6 months    Recent Outpatient Visits          3 weeks ago Annual physical exam   [place], NP   5 months ago Acute right-sided low back pain, unspecified whether sciatica present   [facility] Jon Billings, NP   7 months ago Abdominal aortic aneurysm (AAA) without rupture [facility])   [facility] Jon Billings, NP   10 months ago Atypical mole   [facility] Jon Billings, NP   1 year ago Centrilobular emphysema [facility])   [place], Jolene T, NP      Future Appointments            In 3 days Jon Billings, NP [facility], [facility]   In 9 months  [employer], [place]           . rosuvastatin (CRESTOR) 10 MG tablet [Pharmacy Med Name: ROSUVASTATIN 10MG  TABLETS] 90 tablet 3    Sig: TAKE 1 TABLET(10 MG) BY MOUTH DAILY     Cardiovascular:  Antilipid - Statins Passed - 01/01/2021  7:11 AM      Passed - Total Cholesterol in normal range and within 360 days    Cholesterol, Total  Date Value Ref Range Status   12/05/2020 165 100 - 199 mg/dL Final   Cholesterol Piccolo, Waived  Date Value Ref Range Status  01/05/2015 142 <200 mg/dL Final    Comment:                            Desirable                <200                         Borderline High      200- 239                         High                     >239          Passed - LDL in normal range and within 360 days    LDL Chol Calc (NIH)  Date Value Ref  Range Status  12/05/2020 97 0 - 99 mg/dL Final         Passed - HDL in normal range and within 360 days    HDL  Date Value Ref Range Status  12/05/2020 44 >39 mg/dL Final         Passed - Triglycerides in normal range and within 360 days    Triglycerides  Date Value Ref Range Status  12/05/2020 137 0 - 149 mg/dL Final   Triglycerides Piccolo,Waived  Date Value Ref Range Status  01/05/2015 138 <150 mg/dL Final    Comment:                            Normal                   <150                         Borderline High     150 - 199                         High                200 - 499                         Very High                >499          Passed - Patient is not pregnant      Passed - Valid encounter within last 12 months    Recent Outpatient Visits          3 weeks ago Annual physical exam   [place], NP   5 months ago Acute right-sided low back pain, unspecified whether sciatica present   [facility] Jon Billings, NP   7 months ago Abdominal aortic aneurysm (AAA) without rupture [facility])   [facility] Jon Billings, NP   10 months ago Atypical mole   [facility] Jon Billings, NP   1 year ago Centrilobular emphysema [facility])   [place], Jolene T, NP      Future Appointments            In 3 days Jon Billings, NP [facility], [place]   In 84 months  [employer], [place]

## 2021-01-04 ENCOUNTER — Ambulatory Visit (INDEPENDENT_AMBULATORY_CARE_PROVIDER_SITE_OTHER): Payer: Medicare Other | Admitting: Nurse Practitioner

## 2021-01-04 ENCOUNTER — Other Ambulatory Visit: Payer: Self-pay

## 2021-01-04 ENCOUNTER — Encounter: Payer: Self-pay | Admitting: Nurse Practitioner

## 2021-01-04 VITALS — BP 121/70 | HR 62 | Temp 98.2°F | Wt 182.0 lb

## 2021-01-04 DIAGNOSIS — Z638 Other specified problems related to primary support group: Secondary | ICD-10-CM | POA: Diagnosis not present

## 2021-01-04 DIAGNOSIS — R1031 Right lower quadrant pain: Secondary | ICD-10-CM | POA: Diagnosis not present

## 2021-01-04 NOTE — Assessment & Plan Note (Signed)
Improved from last visit. Has not had to use Xanax since it was given to her.  Will continue to monitor at future visits.

## 2021-01-04 NOTE — Progress Notes (Signed)
BP 121/70   Pulse 62   Temp 98.2 F (36.8 C) (Oral)   Wt 182 lb (82.6 kg)   LMP  (LMP Unknown)   SpO2 94%   BMI 30.29 kg/m    Subjective:    Patient ID: Brandi Steele, female    DOB: 12/02/1955, 65 y.o.   MRN: [id number]  HPI: Brandi Steele is a 65 y.o. female  Chief Complaint  Patient presents with   Stress    CAREGIVER STRAIN Patient states she hasn't had to take any Xanax since our last visit. She usually only takes them if she has a panic attack.  She feels like she is handling things okay at the present time.  Denies SI.   [facility] Visit from 01/04/2021 in [facility]  PHQ-9 Total Score 4       Patient still has a pain in her side.  Does not stay there all the time.  States that it comes from her back.  She has had difficulty using the rest room.    Relevant past medical, surgical, family and social history reviewed and updated as indicated. Interim medical history since our last visit reviewed. Allergies and medications reviewed and updated.  Review of Systems  Gastrointestinal:  Positive for abdominal pain.  Psychiatric/Behavioral:  Positive for dysphoric mood. Negative for suicidal ideas. The patient is nervous/anxious.    Per HPI unless specifically indicated above     Objective:    BP 121/70   Pulse 62   Temp 98.2 F (36.8 C) (Oral)   Wt 182 lb (82.6 kg)   LMP  (LMP Unknown)   SpO2 94%   BMI 30.29 kg/m   Wt Readings from Last 3 Encounters:  01/04/21 182 lb (82.6 kg)  12/05/20 183 lb 9.6 oz (83.3 kg)  10/19/20 178 lb (80.7 kg)    Physical Exam Vitals and nursing note reviewed.  Constitutional:      General: She is not in acute distress.    Appearance: Normal appearance. She is normal weight. She is not ill-appearing, toxic-appearing or diaphoretic.  HENT:     Head: Normocephalic.     Right Ear: External ear normal.     Left Ear: External ear normal.     Nose: Nose normal.     Mouth/Throat:     Mouth: Mucous  membranes are moist.     Pharynx: Oropharynx is clear.  Eyes:     General:        Right eye: No discharge.        Left eye: No discharge.     Extraocular Movements: Extraocular movements intact.     Conjunctiva/sclera: Conjunctivae normal.     Pupils: Pupils are equal, round, and reactive to light.  Cardiovascular:     Rate and Rhythm: Normal rate and regular rhythm.     Heart sounds: No murmur heard. Pulmonary:     Effort: Pulmonary effort is normal. No respiratory distress.     Breath sounds: Normal breath sounds. No wheezing or rales.  Abdominal:     General: Abdomen is flat. Bowel sounds are normal. There is no distension.     Palpations: Abdomen is soft.     Tenderness: There is no abdominal tenderness. There is no right CVA tenderness, left CVA tenderness or guarding.  Musculoskeletal:     Cervical back: Normal range of motion and neck supple.  Skin:    General: Skin is warm and dry.  Capillary Refill: Capillary refill takes less than 2 seconds.  Neurological:     General: No focal deficit present.     Mental Status: She is alert and oriented to person, place, and time. Mental status is at baseline.  Psychiatric:        Mood and Affect: Mood normal.        Behavior: Behavior normal.        Thought Content: Thought content normal.        Judgment: Judgment normal.    Results for orders placed or performed in visit on 12/05/20  Microscopic Examination   Urine  Result Value Ref Range   WBC, UA None seen 0 - 5 /hpf   RBC 0-2 0 - 2 /hpf   Epithelial Cells (non renal) None seen 0 - 10 /hpf   Bacteria, UA None seen None seen/Few  CBC with Differential/Platelet  Result Value Ref Range   WBC 6.2 3.4 - 10.8 x10E3/uL   RBC 4.76 3.77 - 5.28 x10E6/uL   Hemoglobin 13.9 11.1 - 15.9 g/dL   Hematocrit 43.1 34.0 - 46.6 %   MCV 91 79 - 97 fL   MCH 29.2 26.6 - 33.0 pg   MCHC 32.3 31.5 - 35.7 g/dL   RDW 13.5 11.7 - 15.4 %   Platelets 164 150 - 450 x10E3/uL   Neutrophils 65  Not Estab. %   Lymphs 23 Not Estab. %   Monocytes 9 Not Estab. %   Eos 2 Not Estab. %   Basos 1 Not Estab. %   Neutrophils Absolute 4.0 1.4 - 7.0 x10E3/uL   Lymphocytes Absolute 1.4 0.7 - 3.1 x10E3/uL   Monocytes Absolute 0.5 0.1 - 0.9 x10E3/uL   EOS (ABSOLUTE) 0.1 0.0 - 0.4 x10E3/uL   Basophils Absolute 0.1 0.0 - 0.2 x10E3/uL   Immature Granulocytes 0 Not Estab. %   Immature Grans (Abs) 0.0 0.0 - 0.1 x10E3/uL  Comprehensive metabolic panel  Result Value Ref Range   Glucose 90 70 - 99 mg/dL   BUN 16 8 - 27 mg/dL   Creatinine, Ser 0.75 0.57 - 1.00 mg/dL   eGFR 87 >59 mL/min/1.73   BUN/Creatinine Ratio 21 12 - 28   Sodium 141 134 - 144 mmol/L   Potassium 4.8 3.5 - 5.2 mmol/L   Chloride 102 96 - 106 mmol/L   CO2 26 20 - 29 mmol/L   Calcium 9.4 8.7 - 10.3 mg/dL   Total Protein 7.1 6.0 - 8.5 g/dL   Albumin 4.8 3.8 - 4.8 g/dL   Globulin, Total 2.3 1.5 - 4.5 g/dL   Albumin/Globulin Ratio 2.1 1.2 - 2.2   Bilirubin Total 0.2 0.0 - 1.2 mg/dL   Alkaline Phosphatase 55 44 - 121 IU/L   AST 20 0 - 40 IU/L   ALT 14 0 - 32 IU/L  Lipid panel  Result Value Ref Range   Cholesterol, Total 165 100 - 199 mg/dL   Triglycerides 137 0 - 149 mg/dL   HDL 44 >39 mg/dL   VLDL Cholesterol Cal 24 5 - 40 mg/dL   LDL Chol Calc (NIH) 97 0 - 99 mg/dL   Chol/HDL Ratio 3.8 0.0 - 4.4 ratio  TSH  Result Value Ref Range   TSH 1.620 0.450 - 4.500 uIU/mL  Urinalysis, Routine w reflex microscopic  Result Value Ref Range   Specific Gravity, UA 1.015 1.005 - 1.030   pH, UA 8.0 (H) 5.0 - 7.5   Color, UA Yellow Yellow   Appearance Ur  Clear Clear   Leukocytes,UA Negative Negative   Protein,UA Negative Negative/Trace   Glucose, UA Negative Negative   Ketones, UA Negative Negative   RBC, UA Trace (A) Negative   Bilirubin, UA Negative Negative   Urobilinogen, Ur 0.2 0.2 - 1.0 mg/dL   Nitrite, UA Negative Negative   Microscopic Examination See below:   Magnesium  Result Value Ref Range   Magnesium 2.1 1.6 -  2.3 mg/dL      Assessment & Plan:   Problem List Items Addressed This Visit       Other   Caregiver role strain    Improved from last visit. Has not had to use Xanax since it was given to her.  Will continue to monitor at future visits.       Other Visit Diagnoses     Right lower quadrant abdominal pain    -  Primary   Labs normal at last visit. Patient has had difficulty with bowel movements and taking Magnesium. Pain has not changed.  Xray ordered will make recommendations.   Relevant Orders   DG Abd 2 Views        Follow up plan: Return in about 2 months (around 03/07/2021) for HTN, HLD, DM2 FU.

## 2021-01-07 ENCOUNTER — Ambulatory Visit
Admission: RE | Admit: 2021-01-07 | Discharge: 2021-01-07 | Disposition: A | Discharge status: Home / Self Care | Payer: Medicare Other | Attending: Nurse Practitioner | Admitting: Nurse Practitioner

## 2021-01-07 ENCOUNTER — Other Ambulatory Visit: Payer: Self-pay

## 2021-01-07 ENCOUNTER — Ambulatory Visit
Admission: RE | Admit: 2021-01-07 | Discharge: 2021-01-07 | Disposition: A | Discharge status: Home / Self Care | Payer: Medicare Other | Source: Ambulatory Visit | Attending: Nurse Practitioner | Admitting: Nurse Practitioner

## 2021-01-07 DIAGNOSIS — R1031 Right lower quadrant pain: Secondary | ICD-10-CM

## 2021-01-09 NOTE — Progress Notes (Signed)
Please let patient know that his lab work shows that she has a lot of stool in her abdomen.  I recommend increasing fiber intake as well as drinking about 64 ounces of water daily.  If symptoms persist, we can see gastroenterology for further evaluation.

## 2021-01-28 ENCOUNTER — Other Ambulatory Visit: Payer: Self-pay | Admitting: Nurse Practitioner

## 2021-01-30 NOTE — Telephone Encounter (Signed)
Requested Prescriptions  Pending Prescriptions Disp Refills   hydrOXYzine (ATARAX) 25 MG tablet [Pharmacy Med Name: HYDROXYZINE HCL 25MG  TABS (WHITE)] 90 tablet 2    Sig: TAKE 1 TABLET(25 MG) BY MOUTH THREE TIMES DAILY AS NEEDED     Ear, Nose, and Throat:  Antihistamines Passed - 01/28/2021 10:23 AM      Passed - Valid encounter within last 12 months    Recent Outpatient Visits          3 weeks ago Right lower quadrant abdominal pain   [facility] Jon Billings, NP   1 month ago Annual physical exam   [facility] Jon Billings, NP   6 months ago Acute right-sided low back pain, unspecified whether sciatica present   [facility] Jon Billings, NP   8 months ago Abdominal aortic aneurysm (AAA) without rupture [facility])   [facility] Jon Billings, NP   10 months ago Atypical mole   [facility] Jon Billings, NP      Future Appointments            In 1 month Jon Billings, NP [facility] [facility], [place]   In 8 months  [facility], [place]

## 2021-02-19 ENCOUNTER — Ambulatory Visit: Payer: Medicare Other

## 2021-03-07 ENCOUNTER — Other Ambulatory Visit: Payer: Self-pay

## 2021-03-07 ENCOUNTER — Encounter: Payer: Self-pay | Admitting: Nurse Practitioner

## 2021-03-07 ENCOUNTER — Ambulatory Visit (INDEPENDENT_AMBULATORY_CARE_PROVIDER_SITE_OTHER): Payer: Medicare Other | Admitting: Nurse Practitioner

## 2021-03-07 VITALS — BP 118/55 | HR 83 | Temp 98.0°F | Wt 182.8 lb

## 2021-03-07 DIAGNOSIS — I509 Heart failure, unspecified: Secondary | ICD-10-CM | POA: Diagnosis not present

## 2021-03-07 DIAGNOSIS — I10 Essential (primary) hypertension: Secondary | ICD-10-CM

## 2021-03-07 DIAGNOSIS — E782 Mixed hyperlipidemia: Secondary | ICD-10-CM | POA: Diagnosis not present

## 2021-03-07 DIAGNOSIS — Z638 Other specified problems related to primary support group: Secondary | ICD-10-CM

## 2021-03-07 DIAGNOSIS — R7301 Impaired fasting glucose: Secondary | ICD-10-CM

## 2021-03-07 DIAGNOSIS — J432 Centrilobular emphysema: Secondary | ICD-10-CM

## 2021-03-07 DIAGNOSIS — Z1211 Encounter for screening for malignant neoplasm of colon: Secondary | ICD-10-CM | POA: Diagnosis not present

## 2021-03-07 NOTE — Assessment & Plan Note (Signed)
Chronic.  Controlled.  Continue with current medication regimen on Lisinopril 2.5mg  daily.  Labs ordered today.  Return to clinic in 6 months for reevaluation.  Call sooner if concerns arise.

## 2021-03-07 NOTE — Assessment & Plan Note (Signed)
Chronic.  Controlled.  Continue with current medication regimen on crestor 10mg .  Labs ordered today.  Return to clinic in 6 months for reevaluation.  Call sooner if concerns arise.

## 2021-03-07 NOTE — Assessment & Plan Note (Signed)
Chronic.  Controlled.  Continue with current medication regimen on PRN Xanax.  Labs ordered today.  Return to clinic in 6 months for reevaluation.  Call sooner if concerns arise.

## 2021-03-07 NOTE — Assessment & Plan Note (Signed)
Labs drawn 3 months ago.  Stable without medication.

## 2021-03-07 NOTE — Assessment & Plan Note (Signed)
Chronic.  Controlled.  Continue with current medication regimen on Symbicort.  Labs ordered today.  Return to clinic in 6 months for reevaluation.  Call sooner if concerns arise.

## 2021-03-07 NOTE — Progress Notes (Signed)
BP (!) 118/55    Pulse 83    Temp 98 F (36.7 C) (Oral)    Wt 182 lb 12.8 oz (82.9 kg)    LMP  (LMP Unknown)    SpO2 94%    BMI 30.42 kg/m    Subjective:    Patient ID: Brandi Steele, female    DOB: 1955/10/26, 66 y.o.   MRN: [id number]  HPI: Brandi Steele is a 66 y.o. female  No chief complaint on file.  HYPERTENSION / HYPERLIPIDEMIA Satisfied with current treatment? yes Duration of hypertension: years BP monitoring frequency: not checking BP range:  BP medication side effects: no Past BP meds: lisinopril Duration of hyperlipidemia: years Cholesterol medication side effects: no Cholesterol supplements: none Past cholesterol medications: rosuvastatin (crestor) Medication compliance: excellent compliance Aspirin: no Recent stressors: no Recurrent headaches: no Visual changes: no Palpitations: no Dyspnea: no Chest pain: no Lower extremity edema: no Dizzy/lightheaded: no  COPD COPD status: controlled Satisfied with current treatment?: yes Oxygen use: no Dyspnea frequency:  Cough frequency:  Rescue inhaler frequency:   Limitation of activity: no Productive cough:  Last Spirometry:  Pneumovax: Not up to Date Influenza: Not up to Date  CAREGIVER STRAIN Patient is very stressed.  She was given Xanax in November and hasn't taken any.  She feels like having the Xanax is a security net.  Has not had any panic attacks since having it.  Would like to see Dr. Ubaldo Glassing for Cardiology.     Relevant past medical, surgical, family and social history reviewed and updated as indicated. Interim medical history since our last visit reviewed. Allergies and medications reviewed and updated.  Review of Systems  Eyes:  Negative for visual disturbance.  Respiratory:  Negative for cough, chest tightness and shortness of breath.   Cardiovascular:  Negative for chest pain, palpitations and leg swelling.  Neurological:  Negative for dizziness and headaches.  Psychiatric/Behavioral:   The patient is nervous/anxious.    Per HPI unless specifically indicated above     Objective:    BP (!) 118/55    Pulse 83    Temp 98 F (36.7 C) (Oral)    Wt 182 lb 12.8 oz (82.9 kg)    LMP  (LMP Unknown)    SpO2 94%    BMI 30.42 kg/m   Wt Readings from Last 3 Encounters:  03/07/21 182 lb 12.8 oz (82.9 kg)  01/04/21 182 lb (82.6 kg)  12/05/20 183 lb 9.6 oz (83.3 kg)    Physical Exam Vitals and nursing note reviewed.  Constitutional:      General: She is not in acute distress.    Appearance: Normal appearance. She is normal weight. She is not ill-appearing, toxic-appearing or diaphoretic.  HENT:     Head: Normocephalic.     Right Ear: External ear normal.     Left Ear: External ear normal.     Nose: Nose normal.     Mouth/Throat:     Mouth: Mucous membranes are moist.     Pharynx: Oropharynx is clear.  Eyes:     General:        Right eye: No discharge.        Left eye: No discharge.     Extraocular Movements: Extraocular movements intact.     Conjunctiva/sclera: Conjunctivae normal.     Pupils: Pupils are equal, round, and reactive to light.  Cardiovascular:     Rate and Rhythm: Normal rate and regular rhythm.     Heart  sounds: No murmur heard. Pulmonary:     Effort: Pulmonary effort is normal. No respiratory distress.     Breath sounds: Normal breath sounds. No wheezing or rales.  Musculoskeletal:     Cervical back: Normal range of motion and neck supple.  Skin:    General: Skin is warm and dry.     Capillary Refill: Capillary refill takes less than 2 seconds.  Neurological:     General: No focal deficit present.     Mental Status: She is alert and oriented to person, place, and time. Mental status is at baseline.  Psychiatric:        Mood and Affect: Mood normal.        Behavior: Behavior normal.        Thought Content: Thought content normal.        Judgment: Judgment normal.    Results for orders placed or performed in visit on 12/05/20  Microscopic  Examination   Urine  Result Value Ref Range   WBC, UA None seen 0 - 5 /hpf   RBC 0-2 0 - 2 /hpf   Epithelial Cells (non renal) None seen 0 - 10 /hpf   Bacteria, UA None seen None seen/Few  CBC with Differential/Platelet  Result Value Ref Range   WBC 6.2 3.4 - 10.8 x10E3/uL   RBC 4.76 3.77 - 5.28 x10E6/uL   Hemoglobin 13.9 11.1 - 15.9 g/dL   Hematocrit 43.1 34.0 - 46.6 %   MCV 91 79 - 97 fL   MCH 29.2 26.6 - 33.0 pg   MCHC 32.3 31.5 - 35.7 g/dL   RDW 13.5 11.7 - 15.4 %   Platelets 164 150 - 450 x10E3/uL   Neutrophils 65 Not Estab. %   Lymphs 23 Not Estab. %   Monocytes 9 Not Estab. %   Eos 2 Not Estab. %   Basos 1 Not Estab. %   Neutrophils Absolute 4.0 1.4 - 7.0 x10E3/uL   Lymphocytes Absolute 1.4 0.7 - 3.1 x10E3/uL   Monocytes Absolute 0.5 0.1 - 0.9 x10E3/uL   EOS (ABSOLUTE) 0.1 0.0 - 0.4 x10E3/uL   Basophils Absolute 0.1 0.0 - 0.2 x10E3/uL   Immature Granulocytes 0 Not Estab. %   Immature Grans (Abs) 0.0 0.0 - 0.1 x10E3/uL  Comprehensive metabolic panel  Result Value Ref Range   Glucose 90 70 - 99 mg/dL   BUN 16 8 - 27 mg/dL   Creatinine, Ser 0.75 0.57 - 1.00 mg/dL   eGFR 87 >59 mL/min/1.73   BUN/Creatinine Ratio 21 12 - 28   Sodium 141 134 - 144 mmol/L   Potassium 4.8 3.5 - 5.2 mmol/L   Chloride 102 96 - 106 mmol/L   CO2 26 20 - 29 mmol/L   Calcium 9.4 8.7 - 10.3 mg/dL   Total Protein 7.1 6.0 - 8.5 g/dL   Albumin 4.8 3.8 - 4.8 g/dL   Globulin, Total 2.3 1.5 - 4.5 g/dL   Albumin/Globulin Ratio 2.1 1.2 - 2.2   Bilirubin Total 0.2 0.0 - 1.2 mg/dL   Alkaline Phosphatase 55 44 - 121 IU/L   AST 20 0 - 40 IU/L   ALT 14 0 - 32 IU/L  Lipid panel  Result Value Ref Range   Cholesterol, Total 165 100 - 199 mg/dL   Triglycerides 137 0 - 149 mg/dL   HDL 44 >39 mg/dL   VLDL Cholesterol Cal 24 5 - 40 mg/dL   LDL Chol Calc (NIH) 97 0 - 99 mg/dL   Chol/HDL  Ratio 3.8 0.0 - 4.4 ratio  TSH  Result Value Ref Range   TSH 1.620 0.450 - 4.500 uIU/mL  Urinalysis, Routine w  reflex microscopic  Result Value Ref Range   Specific Gravity, UA 1.015 1.005 - 1.030   pH, UA 8.0 (H) 5.0 - 7.5   Color, UA Yellow Yellow   Appearance Ur Clear Clear   Leukocytes,UA Negative Negative   Protein,UA Negative Negative/Trace   Glucose, UA Negative Negative   Ketones, UA Negative Negative   RBC, UA Trace (A) Negative   Bilirubin, UA Negative Negative   Urobilinogen, Ur 0.2 0.2 - 1.0 mg/dL   Nitrite, UA Negative Negative   Microscopic Examination See below:   Magnesium  Result Value Ref Range   Magnesium 2.1 1.6 - 2.3 mg/dL      Assessment & Plan:   Problem List Items Addressed This Visit       Cardiovascular and Mediastinum   CHF (congestive heart failure) ([place])    New referral placed for patient to see Cardiology.  Has not followed up with them in about 3 years.  No symptoms at visit today.       Relevant Orders   Ambulatory referral to Cardiology   Essential hypertension    Chronic.  Controlled.  Continue with current medication regimen on Lisinopril 2.37m daily.  Labs ordered today.  Return to clinic in 6 months for reevaluation.  Call sooner if concerns arise.          Respiratory   Centrilobular emphysema (HCC)    Chronic.  Controlled.  Continue with current medication regimen on Symbicort.  Labs ordered today.  Return to clinic in 6 months for reevaluation.  Call sooner if concerns arise.          Endocrine   IFG (impaired fasting glucose)    Labs drawn 3 months ago.  Stable without medication.        Other   Hyperlipidemia    Chronic.  Controlled.  Continue with current medication regimen on crestor 112m  Labs ordered today.  Return to clinic in 6 months for reevaluation.  Call sooner if concerns arise.        Caregiver role strain    Chronic.  Controlled.  Continue with current medication regimen on PRN Xanax.  Labs ordered today.  Return to clinic in 6 months for reevaluation.  Call sooner if concerns arise.        Other Visit  Diagnoses     Screening for colon cancer    -  Primary   Relevant Orders   Ambulatory referral to Gastroenterology        Follow up plan: Return in about 3 months (around 06/04/2021) for HTN, HLD, DM2 FU.

## 2021-03-07 NOTE — Assessment & Plan Note (Signed)
New referral placed for patient to see Cardiology.  Has not followed up with them in about 3 years.  No symptoms at visit today.

## 2021-03-08 ENCOUNTER — Other Ambulatory Visit: Payer: Self-pay

## 2021-03-08 DIAGNOSIS — Z1211 Encounter for screening for malignant neoplasm of colon: Secondary | ICD-10-CM

## 2021-03-08 MED ORDER — SUTAB 1479-225-188 MG PO TABS: 12.0000 | ORAL_TABLET | Freq: Once | ORAL | 0 refills | Status: AC

## 2021-03-08 NOTE — Progress Notes (Signed)
Gastroenterology Pre-Procedure Review  Request Date: 03/28/2021 Requesting Physician: Dr. Vicente Males   PATIENT REVIEW QUESTIONS: The patient responded to the following health history questions as indicated:    1. Are you having any GI issues? no 2. Do you have a personal history of Polyps? no 3. Do you have a family history of Colon Cancer or Polyps? no 4. Diabetes Mellitus? no 5. Joint replacements in the past 12 months?no 6. Major health problems in the past 3 months?no 7. Any artificial heart valves, MVP, or defibrillator?no    MEDICATIONS & ALLERGIES:    Patient reports the following regarding taking any anticoagulation/antiplatelet therapy:   Plavix, Coumadin, Eliquis, Xarelto, Lovenox, Pradaxa, Brilinta, or Effient? yes (plavix) Aspirin? no  Patient confirms/reports the following medications:  Current Outpatient Medications  Medication Sig Dispense Refill   albuterol (VENTOLIN HFA) 108 (90 Base) MCG/ACT inhaler INHALE 2 PUFFS INTO THE LUNGS EVERY 6 HOURS AS NEEDED FOR WHEEZING OR SHORTNESS OF BREATH 18 g 1   ALPRAZolam (XANAX) 0.5 MG tablet Take 1 tablet (0.5 mg total) by mouth 2 (two) times daily as needed. for anxiety 60 tablet 0   budesonide-formoterol (SYMBICORT) 160-4.5 MCG/ACT inhaler Inhale 2 puffs into the lungs 2 (two) times daily. 1 each 5   Calcium Carbonate-Vit D-Min (CALCIUM 1200 PO) Take by mouth daily.     cetirizine (ZYRTEC) 10 MG tablet Take 1 tablet (10 mg total) by mouth daily. 30 tablet 11   clopidogrel (PLAVIX) 75 MG tablet TAKE 1 TABLET(75 MG) BY MOUTH DAILY 90 tablet 1   cyclobenzaprine (FLEXERIL) 10 MG tablet Take 1 tablet (10 mg total) by mouth 3 (three) times daily as needed for muscle spasms (for firbromyalgia.). 90 tablet 0   esomeprazole (NEXIUM) 40 MG capsule Take 1 tab two times daily. 180 capsule 2   furosemide (LASIX) 40 MG tablet Take 1 tablet (40 mg total) by mouth daily as needed. 30 tablet 3   hydrOXYzine (ATARAX) 25 MG tablet TAKE 1 TABLET(25 MG)  BY MOUTH THREE TIMES DAILY AS NEEDED 90 tablet 2   lisinopril (ZESTRIL) 2.5 MG tablet TAKE 1 TABLET(2.5 MG) BY MOUTH DAILY 90 tablet 0   Magnesium 400 MG CAPS Take by mouth. Take 6 tablets daily     meloxicam (MOBIC) 15 MG tablet TAKE 1 TABLET(15 MG) BY MOUTH DAILY 90 tablet 3   Potassium Gluconate 595 MG CAPS Take by mouth. Take 4 capsules daily     rosuvastatin (CRESTOR) 10 MG tablet TAKE 1 TABLET(10 MG) BY MOUTH DAILY 90 tablet 3   triamcinolone cream (KENALOG) 0.1 % Apply 1 application 2 (two) times daily topically. 80 g 1   No current facility-administered medications for this visit.    Patient confirms/reports the following allergies:  Allergies  Allergen Reactions   Morphine And Related Nausea And Vomiting   Celexa [Citalopram Hydrobromide] Other (See Comments)    Pruritis    Effexor [Venlafaxine] Other (See Comments)    Panic attack   Lipitor [Atorvastatin] Other (See Comments)    Myalgias   Shellfish Allergy Hives and Swelling   Wellbutrin [Bupropion] Anxiety    No orders of the defined types were placed in this encounter.   AUTHORIZATION INFORMATION Primary Insurance: 1D#: Group #:  Secondary Insurance: 1D#: Group #:  SCHEDULE INFORMATION: Date: 03/28/2021 Time: Location:[facility]

## 2021-03-12 ENCOUNTER — Telehealth: Payer: Self-pay

## 2021-03-12 NOTE — Telephone Encounter (Signed)
Called patient about her blood thinner she can stop plavix for 5 days before and restart  1 day after per her clearance from dr Mathis Dad

## 2021-03-15 ENCOUNTER — Telehealth: Payer: Self-pay

## 2021-03-15 NOTE — Telephone Encounter (Signed)
Patient left a voicemail and stating she did not have a ride for her colonoscopy on 03/28/2021. Called patient back and her daughter suppose to let her know when she can get off and she will call us back to schedule. Called endo and talk to [place] and got patient cancel

## 2021-03-18 ENCOUNTER — Telehealth: Payer: Self-pay

## 2021-03-18 NOTE — Telephone Encounter (Signed)
Called patient about her blood thinner she understand but has had to cancel procedure due to no ride

## 2021-03-28 ENCOUNTER — Ambulatory Visit: Admit: 2021-03-28 | Payer: Medicare Other | Admitting: Gastroenterology

## 2021-03-28 SURGERY — COLONOSCOPY WITH PROPOFOL
Anesthesia: General

## 2021-03-29 ENCOUNTER — Telehealth: Payer: Self-pay

## 2021-03-29 ENCOUNTER — Ambulatory Visit: Payer: Self-pay

## 2021-03-29 ENCOUNTER — Telehealth: Payer: Self-pay | Admitting: Gastroenterology

## 2021-03-29 NOTE — Telephone Encounter (Signed)
Patient states she would like to reschedule her colonoscopy. Requesting a call back. ?

## 2021-03-29 NOTE — Telephone Encounter (Signed)
Summary: side pain, constipation /  ? Patient states side pain has worsen and metamucil is not working. Patient states PCP referred her to [facility] but they can not see her until June, patient states she might have cancer and can not wait until then. Patient would like PCP to send another referral to another [facility] who might be able to see her soonre   ?  ? ?Chief Complaint: Abdominal pain. Cancelled colonoscopy. ?Symptoms: Pain, constipation ?Frequency: Started "months ago." ?Pertinent Negatives: Patient denies diarrhea ?Disposition: [] ED /[] Urgent Care (no appt availability in office) / [] Appointment(In office/virtual)/ []  [facility]/ [] Home Care/ [] Refused Recommended Disposition /[] [facility]/ []  Follow-up with PCP ?Additional Notes: Pt. Has appointment 04/01/21.  ?Answer Assessment - Initial Assessment Questions ?1. LOCATION: "Where does it hurt?"  ?    Right side at hip pain ?2. RADIATION: "Does the pain shoot anywhere else?" (e.g., chest, back) ?    BACK ?3. ONSET: "When did the pain begin?" (e.g., minutes, hours or days ago)  ?    Several months ago ?4. SUDDEN: "Gradual or sudden onset?" ?    Gradual ?5. PATTERN "Does the pain come and go, or is it constant?" ?   - If constant: "Is it getting better, staying the same, or worsening?"  ?    (Note: Constant means the pain never goes away completely; most serious pain is constant and it progresses)  ?   - If intermittent: "How long does it last?" "Do you have pain now?" ?    (Note: Intermittent means the pain goes away completely between bouts) ?    Comes and goes ?6. SEVERITY: "How bad is the pain?"  (e.g., Scale 1-10; mild, moderate, or severe) ?  - MILD (1-3): doesn't interfere with normal activities, abdomen soft and not tender to touch  ?  - MODERATE (4-7): interferes with normal activities or awakens from sleep, abdomen tender to touch  ?  - SEVERE (8-10): excruciating pain, doubled over, unable to do any normal  activities  ?    Moderate ?7. RECURRENT SYMPTOM: "Have you ever had this type of stomach pain before?" If Yes, ask: "When was the last time?" and "What happened that time?"  ?    No ?8. CAUSE: "What do you think is causing the stomach pain?" ?    Unsure ?9. RELIEVING/AGGRAVATING FACTORS: "What makes it better or worse?" (e.g., movement, antacids, bowel movement) ?    No ?10. OTHER SYMPTOMS: "Do you have any other symptoms?" (e.g., back pain, diarrhea, fever, urination pain, vomiting) ?      Constipation ?11. PREGNANCY: "Is there any chance you are pregnant?" "When was your last menstrual period?" ?      No ? ?Protocols used: Abdominal Pain - Female-A-AH ? ?

## 2021-03-29 NOTE — Telephone Encounter (Signed)
Called patient to reschedule patient is having right side pain and constipation and wants to be seen by doctor so scheduled her 07/01/2021 ?

## 2021-04-01 ENCOUNTER — Other Ambulatory Visit: Payer: Self-pay

## 2021-04-01 ENCOUNTER — Ambulatory Visit (INDEPENDENT_AMBULATORY_CARE_PROVIDER_SITE_OTHER): Payer: Medicare Other | Admitting: Internal Medicine

## 2021-04-01 ENCOUNTER — Encounter: Payer: Self-pay | Admitting: Internal Medicine

## 2021-04-01 ENCOUNTER — Ambulatory Visit
Admission: RE | Admit: 2021-04-01 | Discharge: 2021-04-01 | Disposition: A | Discharge status: Home / Self Care | Payer: Medicare Other | Attending: Internal Medicine | Admitting: Internal Medicine

## 2021-04-01 ENCOUNTER — Other Ambulatory Visit: Payer: Self-pay | Admitting: Nurse Practitioner

## 2021-04-01 ENCOUNTER — Ambulatory Visit
Admission: RE | Admit: 2021-04-01 | Discharge: 2021-04-01 | Disposition: A | Discharge status: Home / Self Care | Payer: Medicare Other | Source: Ambulatory Visit | Attending: Internal Medicine | Admitting: Internal Medicine

## 2021-04-01 VITALS — BP 110/72 | HR 62 | Temp 97.7°F | Ht 65.0 in | Wt 177.4 lb

## 2021-04-01 DIAGNOSIS — R1031 Right lower quadrant pain: Secondary | ICD-10-CM | POA: Insufficient documentation

## 2021-04-01 DIAGNOSIS — R197 Diarrhea, unspecified: Secondary | ICD-10-CM | POA: Diagnosis not present

## 2021-04-01 NOTE — Progress Notes (Signed)
BP 110/72    Pulse 62    Temp 97.7 F (36.5 C) (Oral)    Ht 5' 5"  (1.651 m)    Wt 177 lb 6.4 oz (80.5 kg)    LMP  (LMP Unknown)    SpO2 93%    BMI 29.52 kg/m    Subjective:    Patient ID: Brandi Steele, female    DOB: 12-28-1955, 66 y.o.   MRN: [id number]  Chief Complaint  Patient presents with   Flank Pain    W/ constipation, is taking Metamucil the pain goes away and when she stops Metamucil the pain comes back. Has appt with Dr. Wilhemena Durie on 6/5 Patient would like to have a new referral for GI     HPI: Brandi Steele is a 66 y.o. female  Has had a Gi bug x 2 2nd time x wed thur Friday of last week - she had to call paramedics out and they was told she dehydrated - she had Diarrhea x 3 days x 1st couple days she had every couple hours . Vomiting + ve  COVID -ve  Feels better over the weekend.    Flank Pain This is a chronic (right side pain radiating to the back.) problem. The current episode started more than 1 month ago. The problem occurs intermittently.   Chief Complaint  Patient presents with   Flank Pain    W/ constipation, is taking Metamucil the pain goes away and when she stops Metamucil the pain comes back. Has appt with Dr. Wilhemena Durie on 6/5 Patient would like to have a new referral for GI     Relevant past medical, surgical, family and social history reviewed and updated as indicated. Interim medical history since our last visit reviewed. Allergies and medications reviewed and updated.  Review of Systems  Genitourinary:  Positive for flank pain.   Per HPI unless specifically indicated above     Objective:    BP 110/72    Pulse 62    Temp 97.7 F (36.5 C) (Oral)    Ht 5' 5"  (1.651 m)    Wt 177 lb 6.4 oz (80.5 kg)    LMP  (LMP Unknown)    SpO2 93%    BMI 29.52 kg/m   Wt Readings from Last 3 Encounters:  04/01/21 177 lb 6.4 oz (80.5 kg)  03/07/21 182 lb 12.8 oz (82.9 kg)  01/04/21 182 lb (82.6 kg)    Physical Exam Vitals and nursing note reviewed.   Constitutional:      General: She is not in acute distress.    Appearance: Normal appearance. She is not ill-appearing or diaphoretic.  Eyes:     Conjunctiva/sclera: Conjunctivae normal.  Pulmonary:     Breath sounds: No rhonchi.  Abdominal:     General: Abdomen is flat. Bowel sounds are normal. There is no distension.     Palpations: Abdomen is soft. There is no mass.     Tenderness: There is no abdominal tenderness. There is no right CVA tenderness, left CVA tenderness, guarding or rebound.     Hernia: No hernia is present.  Skin:    General: Skin is warm and dry.     Coloration: Skin is not jaundiced.     Findings: No erythema.  Neurological:     Mental Status: She is alert.    Results for orders placed or performed in visit on 12/05/20  Microscopic Examination   Urine  Result Value Ref Range   WBC,  UA None seen 0 - 5 /hpf   RBC 0-2 0 - 2 /hpf   Epithelial Cells (non renal) None seen 0 - 10 /hpf   Bacteria, UA None seen None seen/Few  CBC with Differential/Platelet  Result Value Ref Range   WBC 6.2 3.4 - 10.8 x10E3/uL   RBC 4.76 3.77 - 5.28 x10E6/uL   Hemoglobin 13.9 11.1 - 15.9 g/dL   Hematocrit 43.1 34.0 - 46.6 %   MCV 91 79 - 97 fL   MCH 29.2 26.6 - 33.0 pg   MCHC 32.3 31.5 - 35.7 g/dL   RDW 13.5 11.7 - 15.4 %   Platelets 164 150 - 450 x10E3/uL   Neutrophils 65 Not Estab. %   Lymphs 23 Not Estab. %   Monocytes 9 Not Estab. %   Eos 2 Not Estab. %   Basos 1 Not Estab. %   Neutrophils Absolute 4.0 1.4 - 7.0 x10E3/uL   Lymphocytes Absolute 1.4 0.7 - 3.1 x10E3/uL   Monocytes Absolute 0.5 0.1 - 0.9 x10E3/uL   EOS (ABSOLUTE) 0.1 0.0 - 0.4 x10E3/uL   Basophils Absolute 0.1 0.0 - 0.2 x10E3/uL   Immature Granulocytes 0 Not Estab. %   Immature Grans (Abs) 0.0 0.0 - 0.1 x10E3/uL  Comprehensive metabolic panel  Result Value Ref Range   Glucose 90 70 - 99 mg/dL   BUN 16 8 - 27 mg/dL   Creatinine, Ser 0.75 0.57 - 1.00 mg/dL   eGFR 87 >59 mL/min/1.73   BUN/Creatinine  Ratio 21 12 - 28   Sodium 141 134 - 144 mmol/L   Potassium 4.8 3.5 - 5.2 mmol/L   Chloride 102 96 - 106 mmol/L   CO2 26 20 - 29 mmol/L   Calcium 9.4 8.7 - 10.3 mg/dL   Total Protein 7.1 6.0 - 8.5 g/dL   Albumin 4.8 3.8 - 4.8 g/dL   Globulin, Total 2.3 1.5 - 4.5 g/dL   Albumin/Globulin Ratio 2.1 1.2 - 2.2   Bilirubin Total 0.2 0.0 - 1.2 mg/dL   Alkaline Phosphatase 55 44 - 121 IU/L   AST 20 0 - 40 IU/L   ALT 14 0 - 32 IU/L  Lipid panel  Result Value Ref Range   Cholesterol, Total 165 100 - 199 mg/dL   Triglycerides 137 0 - 149 mg/dL   HDL 44 >39 mg/dL   VLDL Cholesterol Cal 24 5 - 40 mg/dL   LDL Chol Calc (NIH) 97 0 - 99 mg/dL   Chol/HDL Ratio 3.8 0.0 - 4.4 ratio  TSH  Result Value Ref Range   TSH 1.620 0.450 - 4.500 uIU/mL  Urinalysis, Routine w reflex microscopic  Result Value Ref Range   Specific Gravity, UA 1.015 1.005 - 1.030   pH, UA 8.0 (H) 5.0 - 7.5   Color, UA Yellow Yellow   Appearance Ur Clear Clear   Leukocytes,UA Negative Negative   Protein,UA Negative Negative/Trace   Glucose, UA Negative Negative   Ketones, UA Negative Negative   RBC, UA Trace (A) Negative   Bilirubin, UA Negative Negative   Urobilinogen, Ur 0.2 0.2 - 1.0 mg/dL   Nitrite, UA Negative Negative   Microscopic Examination See below:   Magnesium  Result Value Ref Range   Magnesium 2.1 1.6 - 2.3 mg/dL        Current Outpatient Medications:    albuterol (VENTOLIN HFA) 108 (90 Base) MCG/ACT inhaler, INHALE 2 PUFFS INTO THE LUNGS EVERY 6 HOURS AS NEEDED FOR WHEEZING OR SHORTNESS OF BREATH, Disp: 18  g, Rfl: 1   ALPRAZolam (XANAX) 0.5 MG tablet, Take 1 tablet (0.5 mg total) by mouth 2 (two) times daily as needed. for anxiety, Disp: 60 tablet, Rfl: 0   budesonide-formoterol (SYMBICORT) 160-4.5 MCG/ACT inhaler, Inhale 2 puffs into the lungs 2 (two) times daily., Disp: 1 each, Rfl: 5   Calcium Carbonate-Vit D-Min (CALCIUM 1200 PO), Take by mouth daily., Disp: , Rfl:    cetirizine (ZYRTEC) 10  MG tablet, Take 1 tablet (10 mg total) by mouth daily., Disp: 30 tablet, Rfl: 11   clopidogrel (PLAVIX) 75 MG tablet, TAKE 1 TABLET(75 MG) BY MOUTH DAILY, Disp: 90 tablet, Rfl: 1   cyclobenzaprine (FLEXERIL) 10 MG tablet, Take 1 tablet (10 mg total) by mouth 3 (three) times daily as needed for muscle spasms (for firbromyalgia.)., Disp: 90 tablet, Rfl: 0   esomeprazole (NEXIUM) 40 MG capsule, Take 1 tab two times daily., Disp: 180 capsule, Rfl: 2   furosemide (LASIX) 40 MG tablet, Take 1 tablet (40 mg total) by mouth daily as needed., Disp: 30 tablet, Rfl: 3   hydrOXYzine (ATARAX) 25 MG tablet, TAKE 1 TABLET(25 MG) BY MOUTH THREE TIMES DAILY AS NEEDED, Disp: 90 tablet, Rfl: 2   lisinopril (ZESTRIL) 2.5 MG tablet, TAKE 1 TABLET(2.5 MG) BY MOUTH DAILY, Disp: 90 tablet, Rfl: 0   Magnesium 400 MG CAPS, Take by mouth. Take 6 tablets daily, Disp: , Rfl:    meloxicam (MOBIC) 15 MG tablet, TAKE 1 TABLET(15 MG) BY MOUTH DAILY, Disp: 90 tablet, Rfl: 3   Potassium Gluconate 595 MG CAPS, Take by mouth. Take 4 capsules daily, Disp: , Rfl:    rosuvastatin (CRESTOR) 10 MG tablet, TAKE 1 TABLET(10 MG) BY MOUTH DAILY, Disp: 90 tablet, Rfl: 3   triamcinolone cream (KENALOG) 0.1 %, Apply 1 application 2 (two) times daily topically., Disp: 80 g, Rfl: 1   SUTAB [phone] MG TABS, Take by mouth., Disp: , Rfl:     Assessment & Plan:  RLQ abdominal pain radiaitng to the back  Never had a cscope wil refer to GI  Check Xray of abdomen consider Ct abdomen  pelvis if pain persists No constipation since being on miralax however has continue RLQ pain  Will refer to GI  Will need to fu with pcp   Problem List Items Addressed This Visit       Other   Right lower quadrant abdominal pain - Primary   Relevant Orders   DG Abd 1 View   Ambulatory referral to Gastroenterology   Other Visit Diagnoses     Diarrhea, unspecified type       Relevant Orders   Comprehensive metabolic panel        Orders  Placed This Encounter  Procedures   DG Abd 1 View   Comprehensive metabolic panel   Ambulatory referral to Gastroenterology     No orders of the defined types were placed in this encounter.    Follow up plan: Return in about 3 weeks (around 04/22/2021).

## 2021-04-01 NOTE — Progress Notes (Signed)
Please let pt know this was normal.

## 2021-04-02 LAB — COMPREHENSIVE METABOLIC PANEL
ALT: 15 IU/L (ref 0–32)
AST: 20 IU/L (ref 0–40)
Albumin/Globulin Ratio: 2 (ref 1.2–2.2)
Albumin: 4.7 g/dL (ref 3.8–4.8)
Alkaline Phosphatase: 59 IU/L (ref 44–121)
BUN/Creatinine Ratio: 22 (ref 12–28)
BUN: 18 mg/dL (ref 8–27)
Bilirubin Total: 0.3 mg/dL (ref 0.0–1.2)
CO2: 27 mmol/L (ref 20–29)
Calcium: 9.3 mg/dL (ref 8.7–10.3)
Chloride: 100 mmol/L (ref 96–106)
Creatinine, Ser: 0.83 mg/dL (ref 0.57–1.00)
Globulin, Total: 2.3 g/dL (ref 1.5–4.5)
Glucose: 99 mg/dL (ref 70–99)
Potassium: 4.3 mmol/L (ref 3.5–5.2)
Sodium: 141 mmol/L (ref 134–144)
Total Protein: 7 g/dL (ref 6.0–8.5)
eGFR: 77 mL/min/{1.73_m2} (ref >59)

## 2021-04-02 NOTE — Telephone Encounter (Signed)
Requested Prescriptions  ?Pending Prescriptions Disp Refills  ?? clopidogrel (PLAVIX) 75 MG tablet [Pharmacy Med Name: CLOPIDOGREL '75MG'$  TABLETS] 90 tablet 1  ?  Sig: TAKE 1 TABLET(75 MG) BY MOUTH DAILY  ?  ? Hematology: Antiplatelets - clopidogrel Passed - 04/01/2021  7:05 AM  ?  ?  Passed - HCT in normal range and within 180 days  ?  Hematocrit  ?Date Value Ref Range Status  ?12/05/2020 43.1 34.0 - 46.6 % Final  ?   ?  ?  Passed - HGB in normal range and within 180 days  ?  Hemoglobin  ?Date Value Ref Range Status  ?12/05/2020 13.9 11.1 - 15.9 g/dL Final  ?   ?  ?  Passed - PLT in normal range and within 180 days  ?  Platelets  ?Date Value Ref Range Status  ?12/05/2020 164 150 - 450 x10E3/uL Final  ?   ?  ?  Passed - Cr in normal range and within 360 days  ?  Creatinine  ?Date Value Ref Range Status  ?06/16/2013 0.63 0.60 - 1.30 mg/dL Final  ? ?Creatinine, Ser  ?Date Value Ref Range Status  ?04/01/2021 0.83 0.57 - 1.00 mg/dL Final  ?   ?  ?  Passed - Valid encounter within last 6 months  ?  Recent Outpatient Visits   ?      ? Yesterday Right lower quadrant abdominal pain  ? [facility], [place], MD  ? 3 weeks ago Screening for colon cancer  ? [place], NP  ? 2 months ago Right lower quadrant abdominal pain  ? [place], [place]  ? 3 months ago Annual physical exam  ? [place], NP  ? 8 months ago Acute right-sided low back pain, unspecified whether sciatica present  ? [facility] Jon Billings, NP  ?  ?  ?Future Appointments   ?        ? In 2 weeks Jon Billings, NP [facility]  ? In 2 months Jon Billings, NP [facility], [facility]  ? In 5 months Ralene Bathe, MD [place]  ? In 6 months  [place], [facility]  ?  ? ?  ?  ?  ?? lisinopril (ZESTRIL) 2.5 MG tablet [Pharmacy Med Name: LISINOPRIL 2.'5MG'$  TABLETS] 90 tablet 0  ?  Sig: TAKE  1 TABLET(2.5 MG) BY MOUTH DAILY  ?  ? Cardiovascular:  ACE Inhibitors Passed - 04/01/2021  7:05 AM  ?  ?  Passed - Cr in normal range and within 180 days  ?  Creatinine  ?Date Value Ref Range Status  ?06/16/2013 0.63 0.60 - 1.30 mg/dL Final  ? ?Creatinine, Ser  ?Date Value Ref Range Status  ?04/01/2021 0.83 0.57 - 1.00 mg/dL Final  ?   ?  ?  Passed - K in normal range and within 180 days  ?  Potassium  ?Date Value Ref Range Status  ?04/01/2021 4.3 3.5 - 5.2 mmol/L Final  ?06/16/2013 4.0 3.5 - 5.1 mmol/L Final  ?   ?  ?  Passed - Patient is not pregnant  ?  ?  Passed - Last BP in normal range  ?  BP Readings from Last 1 Encounters:  ?04/01/21 110/72  ?   ?  ?  Passed - Valid encounter within last 6 months  ?  Recent Outpatient Visits   ?      ?  Yesterday Right lower quadrant abdominal pain  ? [facility] Vigg, [place], MD  ? 3 weeks ago Screening for colon cancer  ? [place], NP  ? 2 months ago Right lower quadrant abdominal pain  ? [place], NP  ? 3 months ago Annual physical exam  ? [place], NP  ? 8 months ago Acute right-sided low back pain, unspecified whether sciatica present  ? [facility] Jon Billings, NP  ?  ?  ?Future Appointments   ?        ? In 2 weeks Jon Billings, NP [facility], [facility]  ? In 2 months Jon Billings, NP [facility], [facility]  ? In 5 months Ralene Bathe, MD [place]  ? In 6 months  [place], [facility]  ?  ? ?  ?  ?  ? ?

## 2021-04-22 ENCOUNTER — Encounter: Payer: Self-pay | Admitting: Nurse Practitioner

## 2021-04-22 ENCOUNTER — Other Ambulatory Visit: Payer: Self-pay

## 2021-04-22 ENCOUNTER — Ambulatory Visit (INDEPENDENT_AMBULATORY_CARE_PROVIDER_SITE_OTHER): Payer: Medicare Other | Admitting: Nurse Practitioner

## 2021-04-22 VITALS — BP 117/62 | HR 60 | Temp 98.0°F | Wt 179.0 lb

## 2021-04-22 DIAGNOSIS — M545 Low back pain, unspecified: Secondary | ICD-10-CM

## 2021-04-22 DIAGNOSIS — R1031 Right lower quadrant pain: Secondary | ICD-10-CM

## 2021-04-22 DIAGNOSIS — G8929 Other chronic pain: Secondary | ICD-10-CM

## 2021-04-22 MED ORDER — TRIAMCINOLONE ACETONIDE 0.1 % EX CREA: 1.0000 "application " | TOPICAL_CREAM | Freq: Two times a day (BID) | CUTANEOUS | 1 refills | Status: AC

## 2021-04-22 MED ORDER — BACLOFEN 10 MG PO TABS
10.0000 mg | ORAL_TABLET | Freq: Three times a day (TID) | ORAL | 0 refills | Status: DC
Start: 2021-04-22 — End: 2021-06-05

## 2021-04-22 NOTE — Progress Notes (Signed)
? ?BP 117/62   Pulse 60   Temp 98 ?F (36.7 ?C) (Oral)   Wt 179 lb (81.2 kg)   LMP  (LMP Unknown)   SpO2 94%   BMI 29.79 kg/m?   ? ?Subjective:  ? ? Patient ID: Brandi Steele, female    DOB: [birth year], 66 y.o.   MRN: [id number] ? ?HPI: ?Brandi Steele is a 66 y.o. female ? ?Chief Complaint  ?Patient presents with  ? Abdominal Pain  ?  3 week f/up  ? ?ABDOMINAL PAIN  ?Duration:weeks ?Onset: gradual ?Severity: mild ?Quality: cramping ?Location:  RLQ  ?Episode duration:  ?Radiation: yes to back ?Frequency: intermittent ?Alleviating factors:  ?Aggravating factors: ?Status: better ?Treatments attempted:  miralax ?Fever: no ?Nausea: no ?Vomiting: no ?Weight loss: no ?Decreased appetite: no ?Diarrhea: no ?Constipation: no ?Blood in stool: no ?Heartburn: no ?Jaundice: no ?Rash: no ?Dysuria/urinary frequency: no ?Hematuria: no ?History of sexually transmitted disease: no ?Recurrent NSAID use: no ? ?Patient states her back has been bothering her.  ? ?Relevant past medical, surgical, family and social history reviewed and updated as indicated. Interim medical history since our last visit reviewed. ?Allergies and medications reviewed and updated. ? ?Review of Systems  ?Constitutional:  Positive for fatigue. Negative for fever.  ?Gastrointestinal:  Positive for abdominal pain. Negative for constipation, diarrhea, nausea and vomiting.  ? ?Per HPI unless specifically indicated above ? ?   ?Objective:  ?  ?BP 117/62   Pulse 60   Temp 98 ?F (36.7 ?C) (Oral)   Wt 179 lb (81.2 kg)   LMP  (LMP Unknown)   SpO2 94%   BMI 29.79 kg/m?   ?Wt Readings from Last 3 Encounters:  ?04/22/21 179 lb (81.2 kg)  ?04/01/21 177 lb 6.4 oz (80.5 kg)  ?03/07/21 182 lb 12.8 oz (82.9 kg)  ?  ?Physical Exam ?Vitals and nursing note reviewed.  ?Constitutional:   ?   General: She is not in acute distress. ?   Appearance: Normal appearance. She is normal weight. She is not ill-appearing, toxic-appearing or diaphoretic.  ?HENT:  ?   Head:  Normocephalic.  ?   Right Ear: External ear normal.  ?   Left Ear: External ear normal.  ?   Nose: Nose normal.  ?   Mouth/Throat:  ?   Mouth: Mucous membranes are moist.  ?   Pharynx: Oropharynx is clear.  ?Eyes:  ?   General:     ?   Right eye: No discharge.     ?   Left eye: No discharge.  ?   Extraocular Movements: Extraocular movements intact.  ?   Conjunctiva/sclera: Conjunctivae normal.  ?   Pupils: Pupils are equal, round, and reactive to light.  ?Cardiovascular:  ?   Rate and Rhythm: Normal rate and regular rhythm.  ?   Heart sounds: No murmur heard. ?Pulmonary:  ?   Effort: Pulmonary effort is normal. No respiratory distress.  ?   Breath sounds: Normal breath sounds. No wheezing or rales.  ?Musculoskeletal:  ?   Cervical back: Normal range of motion and neck supple.  ?Skin: ?   General: Skin is warm and dry.  ?   Capillary Refill: Capillary refill takes less than 2 seconds.  ?Neurological:  ?   General: No focal deficit present.  ?   Mental Status: She is alert and oriented to person, place, and time. Mental status is at baseline.  ?Psychiatric:     ?   Mood and Affect: Mood  normal.     ?   Behavior: Behavior normal.     ?   Thought Content: Thought content normal.     ?   Judgment: Judgment normal.  ? ? ?Results for orders placed or performed in visit on 04/01/21  ?Comprehensive metabolic panel  ?Result Value Ref Range  ? Glucose 99 70 - 99 mg/dL  ? BUN 18 8 - 27 mg/dL  ? Creatinine, Ser 0.83 0.57 - 1.00 mg/dL  ? eGFR 77 >59 mL/min/1.73  ? BUN/Creatinine Ratio 22 12 - 28  ? Sodium 141 134 - 144 mmol/L  ? Potassium 4.3 3.5 - 5.2 mmol/L  ? Chloride 100 96 - 106 mmol/L  ? CO2 27 20 - 29 mmol/L  ? Calcium 9.3 8.7 - 10.3 mg/dL  ? Total Protein 7.0 6.0 - 8.5 g/dL  ? Albumin 4.7 3.8 - 4.8 g/dL  ? Globulin, Total 2.3 1.5 - 4.5 g/dL  ? Albumin/Globulin Ratio 2.0 1.2 - 2.2  ? Bilirubin Total 0.3 0.0 - 1.2 mg/dL  ? Alkaline Phosphatase 59 44 - 121 IU/L  ? AST 20 0 - 40 IU/L  ? ALT 15 0 - 32 IU/L  ? ?   ?Assessment &  Plan:  ? ?Problem List Items Addressed This Visit   ? ?  ? Other  ? Right lower quadrant abdominal pain - Primary  ?  Improved from prior but still there at times. Reviewed xray with patient from last visit. Discussed with her that she should see GI for further evaluation. Will work with referral coordinator to get patient an appt. ?  ?  ? ?Other Visit Diagnoses   ? ? Chronic midline low back pain without sciatica      ? Ongoing. Flexeril is too strong.  Will try baclofen. okay to take PRN meloxicam for pain. FU if symptoms do not improve.  ? Relevant Medications  ? baclofen (LIORESAL) 10 MG tablet  ? ?  ?  ? ?Follow up plan: ?Return for Follow up already scheduled.. ? ? ? ? ? ? ?

## 2021-04-22 NOTE — Assessment & Plan Note (Signed)
Improved from prior but still there at times. Reviewed xray with patient from last visit. Discussed with her that she should see GI for further evaluation. Will work with referral coordinator to get patient an appt. ?

## 2021-05-01 ENCOUNTER — Telehealth: Payer: Self-pay | Admitting: Acute Care

## 2021-05-01 NOTE — Telephone Encounter (Signed)
Attempted to schedule annual LDCT scan-LVMM. ?

## 2021-06-04 ENCOUNTER — Other Ambulatory Visit: Payer: Self-pay | Admitting: Nurse Practitioner

## 2021-06-05 ENCOUNTER — Ambulatory Visit (INDEPENDENT_AMBULATORY_CARE_PROVIDER_SITE_OTHER): Payer: Medicare Other | Admitting: Nurse Practitioner

## 2021-06-05 ENCOUNTER — Encounter: Payer: Self-pay | Admitting: Nurse Practitioner

## 2021-06-05 VITALS — BP 128/67 | HR 80 | Temp 98.0°F | Wt 176.6 lb

## 2021-06-05 DIAGNOSIS — I714 Abdominal aortic aneurysm, without rupture, unspecified: Secondary | ICD-10-CM | POA: Diagnosis not present

## 2021-06-05 DIAGNOSIS — E782 Mixed hyperlipidemia: Secondary | ICD-10-CM

## 2021-06-05 DIAGNOSIS — I509 Heart failure, unspecified: Secondary | ICD-10-CM

## 2021-06-05 DIAGNOSIS — I10 Essential (primary) hypertension: Secondary | ICD-10-CM

## 2021-06-05 DIAGNOSIS — J432 Centrilobular emphysema: Secondary | ICD-10-CM

## 2021-06-05 DIAGNOSIS — I700 Atherosclerosis of aorta: Secondary | ICD-10-CM | POA: Diagnosis not present

## 2021-06-05 DIAGNOSIS — R7301 Impaired fasting glucose: Secondary | ICD-10-CM

## 2021-06-05 NOTE — Assessment & Plan Note (Signed)
New referral placed for patient to see Cardiology.  Patient is not happy with her current Cardiologist.  No symptoms at visit today.  ? ?- Reminded to call for an overnight weight gain of >2 pounds or a weekly weight gain of >5 pounds ?- not adding salt to food and read food labels. Reviewed the importance of keeping daily sodium intake to '2000mg'$  daily. ?- Avoid Ibuprofen products. ? ?

## 2021-06-05 NOTE — Assessment & Plan Note (Signed)
Chronic.  Controlled.  Continue with current medication regimen on Symbicort.  Labs ordered today.  Return to clinic in 3 months for reevaluation.  Call sooner if concerns arise.  ? ?

## 2021-06-05 NOTE — Assessment & Plan Note (Signed)
Labs ordered today.  Will make recommendations based on lab results. ?

## 2021-06-05 NOTE — Assessment & Plan Note (Signed)
Stable.  Followed by vascular surgeon.  Had repeat US which showed AAA is 2.8cm and stable from previous exam.  Continue to follow up with surgeon. 

## 2021-06-05 NOTE — Progress Notes (Signed)
? ?BP 128/67   Pulse 80   Temp 98 ?F (36.7 ?C) (Oral)   Wt 176 lb 9.6 oz (80.1 kg)   LMP  (LMP Unknown)   SpO2 92%   BMI 29.39 kg/m?   ? ?Subjective:  ? ? Patient ID: Brandi Steele, female    DOB: Oct 08, 1955, 66 y.o.   MRN: [id number] ? ?HPI: ?Brandi Steele is a 66 y.o. female ? ?Chief Complaint  ?Patient presents with  ? Hypertension  ? Hyperlipidemia  ? Diabetes  ? ?CHF ?Patient see's Dr. Nehemiah Massed who manages her CHF.  She would like to see someone else. Does not care for Dr. Nehemiah Massed. ? ?COPD ?Patient reports overall doing well.  Denies concerns at visit today.  ? ?HYPERTENSION / HYPERLIPIDEMIA ?Satisfied with current treatment? no ?Duration of hypertension: years ?BP monitoring frequency: daily ?BP range: 110-125/60-70 ?BP medication side effects: no ?Past BP meds: lisinopril ?Duration of hyperlipidemia: years ?Cholesterol medication side effects: no ?Cholesterol supplements: none ?Past cholesterol medications: rosuvastatin (crestor) ?Medication compliance: excellent compliance ?Aspirin: no ?Recent stressors: no ?Recurrent headaches: no ?Visual changes: no ?Palpitations: no ?Dyspnea: no ?Chest pain: no ?Lower extremity edema: no ?Dizzy/lightheaded: no ? ?ANXIETY ?Patient has anxiety and PTSD.  She has seen a counselor who has helped her control her symptoms without medication.  However, she does use Xanax on a very rare occasion.  Denies SI.  She is worried about her brother having his EGD tomorrow.  ? ?Relevant past medical, surgical, family and social history reviewed and updated as indicated. Interim medical history since our last visit reviewed. ?Allergies and medications reviewed and updated. ? ?Review of Systems  ?Eyes:  Negative for visual disturbance.  ?Respiratory:  Negative for cough, chest tightness and shortness of breath.   ?Cardiovascular:  Negative for chest pain, palpitations and leg swelling.  ?Neurological:  Negative for dizziness and headaches.  ?Psychiatric/Behavioral:  Negative for  dysphoric mood and suicidal ideas. The patient is nervous/anxious.   ? ?Per HPI unless specifically indicated above ? ?   ?Objective:  ?  ?BP 128/67   Pulse 80   Temp 98 ?F (36.7 ?C) (Oral)   Wt 176 lb 9.6 oz (80.1 kg)   LMP  (LMP Unknown)   SpO2 92%   BMI 29.39 kg/m?   ?Wt Readings from Last 3 Encounters:  ?06/05/21 176 lb 9.6 oz (80.1 kg)  ?04/22/21 179 lb (81.2 kg)  ?04/01/21 177 lb 6.4 oz (80.5 kg)  ?  ?Physical Exam ?Vitals and nursing note reviewed.  ?Constitutional:   ?   General: She is not in acute distress. ?   Appearance: Normal appearance. She is normal weight. She is not ill-appearing, toxic-appearing or diaphoretic.  ?HENT:  ?   Head: Normocephalic.  ?   Right Ear: External ear normal.  ?   Left Ear: External ear normal.  ?   Nose: Nose normal.  ?   Mouth/Throat:  ?   Mouth: Mucous membranes are moist.  ?   Pharynx: Oropharynx is clear.  ?Eyes:  ?   General:     ?   Right eye: No discharge.     ?   Left eye: No discharge.  ?   Extraocular Movements: Extraocular movements intact.  ?   Conjunctiva/sclera: Conjunctivae normal.  ?   Pupils: Pupils are equal, round, and reactive to light.  ?Cardiovascular:  ?   Rate and Rhythm: Normal rate and regular rhythm.  ?   Heart sounds: No murmur heard. ?Pulmonary:  ?  Effort: Pulmonary effort is normal. No respiratory distress.  ?   Breath sounds: Normal breath sounds. No wheezing or rales.  ?Musculoskeletal:  ?   Cervical back: Normal range of motion and neck supple.  ?Skin: ?   General: Skin is warm and dry.  ?   Capillary Refill: Capillary refill takes less than 2 seconds.  ?Neurological:  ?   General: No focal deficit present.  ?   Mental Status: She is alert and oriented to person, place, and time. Mental status is at baseline.  ?Psychiatric:     ?   Mood and Affect: Mood normal.     ?   Behavior: Behavior normal.     ?   Thought Content: Thought content normal.     ?   Judgment: Judgment normal.  ? ? ?Results for orders placed or performed in visit on  04/01/21  ?Comprehensive metabolic panel  ?Result Value Ref Range  ? Glucose 99 70 - 99 mg/dL  ? BUN 18 8 - 27 mg/dL  ? Creatinine, Ser 0.83 0.57 - 1.00 mg/dL  ? eGFR 77 >59 mL/min/1.73  ? BUN/Creatinine Ratio 22 12 - 28  ? Sodium 141 134 - 144 mmol/L  ? Potassium 4.3 3.5 - 5.2 mmol/L  ? Chloride 100 96 - 106 mmol/L  ? CO2 27 20 - 29 mmol/L  ? Calcium 9.3 8.7 - 10.3 mg/dL  ? Total Protein 7.0 6.0 - 8.5 g/dL  ? Albumin 4.7 3.8 - 4.8 g/dL  ? Globulin, Total 2.3 1.5 - 4.5 g/dL  ? Albumin/Globulin Ratio 2.0 1.2 - 2.2  ? Bilirubin Total 0.3 0.0 - 1.2 mg/dL  ? Alkaline Phosphatase 59 44 - 121 IU/L  ? AST 20 0 - 40 IU/L  ? ALT 15 0 - 32 IU/L  ? ?   ?Assessment & Plan:  ? ?Problem List Items Addressed This Visit   ? ?  ? Cardiovascular and Mediastinum  ? CHF (congestive heart failure) ([place])  ?  New referral placed for patient to see Cardiology.  Patient is not happy with her current Cardiologist.  No symptoms at visit today.  ? ?- Reminded to call for an overnight weight gain of >2 pounds or a weekly weight gain of >5 pounds ?- not adding salt to food and read food labels. Reviewed the importance of keeping daily sodium intake to <2045m daily. ?- Avoid Ibuprofen products. ? ?  ?  ? Relevant Orders  ? Ambulatory referral to Cardiology  ? Essential hypertension - Primary  ?  Chronic.  Controlled.  Continue with current medication regimen on Lisinopril 2.524mdaily.  Labs ordered today.  Return to clinic in 6 months for reevaluation.  Call sooner if concerns arise.  ? ? ?  ?  ? Relevant Orders  ? Comp Met (CMET)  ? Aortic atherosclerosis (HCTy Ty ?  Found on imaging CT 01/2017.  Recommend she continue taking statin and ASA daily for prevention.  Continue complete cessation of smoking.  Follow up in 3 months.  Call sooner if concerns arise.  ? ?  ?  ? Abdominal aortic aneurysm (AAA) without rupture ([place] ?  Stable.  Followed by vascular surgeon.  Had repeat US[place]ich showed AAA is 2.8cm and stable from previous exam.  Continue to  follow up with surgeon. ? ?  ?  ?  ? Respiratory  ? Centrilobular emphysema ([place] ?  Chronic.  Controlled.  Continue with current medication regimen on Symbicort.  Labs  ordered today.  Return to clinic in 3 months for reevaluation.  Call sooner if concerns arise.  ? ? ?  ?  ?  ? Endocrine  ? IFG (impaired fasting glucose)  ?  Labs ordered today. Will make recommendations based on lab results.  ? ?  ?  ? Relevant Orders  ? HgB A1c  ?  ? Other  ? Hyperlipidemia  ?  Chronic.  Controlled.  Continue with current medication regimen on crestor 81m.  Labs ordered today.  Return to clinic in 3 months for reevaluation.  Call sooner if concerns arise.  ? ? ?  ?  ? Relevant Orders  ? Lipid Profile  ? Hypomagnesemia  ?  Labs ordered today. Will make recommendations based on lab results. ? ?  ?  ? Relevant Orders  ? Magnesium  ?  ? ?Follow up plan: ?Return in about 3 months (around 09/05/2021) for HTN, HLD, DM2 FU. ? ? ? ? ? ? ?

## 2021-06-05 NOTE — Assessment & Plan Note (Signed)
Chronic.  Controlled.  Continue with current medication regimen on crestor '10mg'$ .  Labs ordered today.  Return to clinic in 3 months for reevaluation.  Call sooner if concerns arise.  ? ?

## 2021-06-05 NOTE — Telephone Encounter (Signed)
Requested Prescriptions  ?Pending Prescriptions Disp Refills  ?? baclofen (LIORESAL) 10 MG tablet [Pharmacy Med Name: BACLOFEN 10MG TABLETS] 30 tablet 0  ?  Sig: TAKE 1 TABLET(10 MG) BY MOUTH THREE TIMES DAILY  ?  ? Analgesics:  Muscle Relaxants - baclofen Passed - 06/04/2021  1:03 PM  ?  ?  Passed - Cr in normal range and within 180 days  ?  Creatinine  ?Date Value Ref Range Status  ?06/16/2013 0.63 0.60 - 1.30 mg/dL Final  ? ?Creatinine, Ser  ?Date Value Ref Range Status  ?04/01/2021 0.83 0.57 - 1.00 mg/dL Final  ?   ?  ?  Passed - eGFR is 30 or above and within 180 days  ?  EGFR (African American)  ?Date Value Ref Range Status  ?06/16/2013 >60  Final  ? ?GFR calc Af Wyvonnia Lora  ?Date Value Ref Range Status  ?12/08/2019 106 >59 mL/min/1.73 Final  ?  Comment:  ?  **In accordance with recommendations from the NKF-ASN Task force,** ?  [facility] is in the process of updating its eGFR calculation to the ?  2021 CKD-EPI creatinine equation that estimates kidney function ?  without a race variable. ?  ? ?EGFR (Non-African Amer.)  ?Date Value Ref Range Status  ?06/16/2013 >60  Final  ?  Comment:  ?  eGFR values <92m/min/1.73 m2 may be an indication of chronic ?kidney disease (CKD). ?Calculated eGFR is useful in patients with stable renal function. ?The eGFR calculation will not be reliable in acutely ill patients ?when serum creatinine is changing rapidly. It is not useful in  ?patients on dialysis. The eGFR calculation may not be applicable ?to patients at the low and high extremes of body sizes, pregnant ?women, and vegetarians. ?  ? ?GFR calc non Af Amer  ?Date Value Ref Range Status  ?12/08/2019 92 >59 mL/min/1.73 Final  ? ?eGFR  ?Date Value Ref Range Status  ?04/01/2021 77 >59 mL/min/1.73 Final  ?   ?  ?  Passed - Valid encounter within last 6 months  ?  Recent Outpatient Visits   ?      ? 1 month ago Right lower quadrant abdominal pain  ? [place] [facility]  ? 2 months [facility]  ab[facility]  [facility]  ? 3 months ago [facility] for colon cancer  ? [facility] [place] [facility]  ? 5 months ago Right lower quadrant abdominal pain  ? [place]  ? 6 months ago [facility] physical exam  [facility]  ?  ?  ?Future Appointments   ?        ? [facility] ? In 3 months [facility] ? [facility] 4 months  [facility]  ?  ? ?  ?  ?  ? ?

## 2021-06-05 NOTE — Assessment & Plan Note (Addendum)
Found on imaging CT 01/2017.  Recommend she continue taking statin and ASA daily for prevention.  Continue complete cessation of smoking.  Follow up in 3 months.  Call sooner if concerns arise.  ?

## 2021-06-05 NOTE — Assessment & Plan Note (Signed)
Chronic.  Controlled.  Continue with current medication regimen on Lisinopril 2.'5mg'$  daily.  Labs ordered today.  Return to clinic in 6 months for reevaluation.  Call sooner if concerns arise.  ? ?

## 2021-06-06 LAB — COMPREHENSIVE METABOLIC PANEL
ALT: 13 IU/L (ref 0–32)
AST: 16 IU/L (ref 0–40)
Albumin/Globulin Ratio: 2.2 (ref 1.2–2.2)
Albumin: 4.7 g/dL (ref 3.8–4.8)
Alkaline Phosphatase: 53 IU/L (ref 44–121)
BUN/Creatinine Ratio: 16 (ref 12–28)
BUN: 14 mg/dL (ref 8–27)
Bilirubin Total: 0.2 mg/dL (ref 0.0–1.2)
CO2: 26 mmol/L (ref 20–29)
Calcium: 9.6 mg/dL (ref 8.7–10.3)
Chloride: 105 mmol/L (ref 96–106)
Creatinine, Ser: 0.89 mg/dL (ref 0.57–1.00)
Globulin, Total: 2.1 g/dL (ref 1.5–4.5)
Glucose: 91 mg/dL (ref 70–99)
Potassium: 4.3 mmol/L (ref 3.5–5.2)
Sodium: 145 mmol/L — ABNORMAL HIGH (ref 134–144)
Total Protein: 6.8 g/dL (ref 6.0–8.5)
eGFR: 71 mL/min/{1.73_m2} (ref >59)

## 2021-06-06 LAB — LIPID PANEL
Chol/HDL Ratio: 3.9 ratio (ref 0.0–4.4)
Cholesterol, Total: 168 mg/dL (ref 100–199)
HDL: 43 mg/dL
LDL Chol Calc (NIH): 94 mg/dL (ref 0–99)
Triglycerides: 179 mg/dL — ABNORMAL HIGH (ref 0–149)
VLDL Cholesterol Cal: 31 mg/dL (ref 5–40)

## 2021-06-06 LAB — MAGNESIUM: Magnesium: 1.8 mg/dL (ref 1.6–2.3)

## 2021-06-06 LAB — HEMOGLOBIN A1C
Est. average glucose Bld gHb Est-mCnc: 114 mg/dL
Hgb A1c MFr Bld: 5.6 % (ref 4.8–5.6)

## 2021-06-06 NOTE — Progress Notes (Signed)
Please let patient know that her lab work looks good.  Her A1c and magnesium are within normal limites.  Triglycerides are slightly elevated.  Recommend decreasing processed food and refined sugar intake.  Otherwise no concerns at this time.  Follow up as discussed.  ? ?

## 2021-06-17 ENCOUNTER — Ambulatory Visit (INDEPENDENT_AMBULATORY_CARE_PROVIDER_SITE_OTHER): Payer: Medicare Other | Admitting: Nurse Practitioner

## 2021-06-17 ENCOUNTER — Encounter: Payer: Self-pay | Admitting: Nurse Practitioner

## 2021-06-17 VITALS — BP 131/67 | HR 76 | Temp 97.8°F | Wt 179.2 lb

## 2021-06-17 DIAGNOSIS — R8290 Unspecified abnormal findings in urine: Secondary | ICD-10-CM | POA: Diagnosis not present

## 2021-06-17 DIAGNOSIS — M545 Low back pain, unspecified: Secondary | ICD-10-CM

## 2021-06-17 LAB — URINALYSIS, ROUTINE W REFLEX MICROSCOPIC
Bilirubin, UA: NEGATIVE
Glucose, UA: NEGATIVE
Ketones, UA: NEGATIVE
Nitrite, UA: NEGATIVE
Protein,UA: NEGATIVE
Specific Gravity, UA: 1.02 (ref 1.005–1.030)
Urobilinogen, Ur: 0.2 mg/dL (ref 0.2–1.0)
pH, UA: 6.5 (ref 5.0–7.5)

## 2021-06-17 LAB — MICROSCOPIC EXAMINATION: WBC, UA: NONE SEEN /hpf (ref 0–5)

## 2021-06-17 MED ORDER — HYDROCODONE-ACETAMINOPHEN 5-325 MG PO TABS: 1.0000 | ORAL_TABLET | Freq: Four times a day (QID) | ORAL | 0 refills | Status: DC | PRN

## 2021-06-17 MED ORDER — METHYLPREDNISOLONE 4 MG PO TBPK: ORAL_TABLET | ORAL | 0 refills | Status: DC

## 2021-06-17 NOTE — Progress Notes (Signed)
BP 131/67 (BP Location: Right Arm, Cuff Size: Normal)   Pulse 76   Temp 97.8 F (36.6 C) (Oral)   Wt 179 lb 3.2 oz (81.3 kg)   LMP  (LMP Unknown)   SpO2 92%   BMI 29.82 kg/m    Subjective:    Patient ID: Brandi Steele, female    DOB: 20-May-1955, 66 y.o.   MRN: [id number]  HPI: Brandi Steele is a 66 y.o. female  Chief Complaint  Patient presents with   Back Pain    R low back pain. No hx of kidney stones. Pt states her daughter who is an RN told her she is probably passing a kidney stone. Denies dysuria. States she is having urinary frequency. This is been going on for a week.    BACK PAIN Duration:  3 days Mechanism of injury: no trauma Location: Right Onset: sudden Severity: 9/10 Quality: sharp Frequency: intermittent Radiation: none Aggravating factors: movement- can't get comfortable Alleviating factors:  tylenol Status: worse Treatments attempted: none  Relief with NSAIDs?: no Nighttime pain:  no Paresthesias / decreased sensation:  no Bowel / bladder incontinence:  no Fevers:  no Dysuria / urinary frequency:  no No relief with Meloxicam   Relevant past medical, surgical, family and social history reviewed and updated as indicated. Interim medical history since our last visit reviewed. Allergies and medications reviewed and updated.  Review of Systems  Gastrointestinal:  Negative for abdominal pain.  Genitourinary:  Negative for dysuria.  Musculoskeletal:  Positive for back pain.   Per HPI unless specifically indicated above     Objective:    BP 131/67 (BP Location: Right Arm, Cuff Size: Normal)   Pulse 76   Temp 97.8 F (36.6 C) (Oral)   Wt 179 lb 3.2 oz (81.3 kg)   LMP  (LMP Unknown)   SpO2 92%   BMI 29.82 kg/m   Wt Readings from Last 3 Encounters:  06/17/21 179 lb 3.2 oz (81.3 kg)  06/05/21 176 lb 9.6 oz (80.1 kg)  04/22/21 179 lb (81.2 kg)    Physical Exam Vitals and nursing note reviewed.  Constitutional:      General: She is  not in acute distress.    Appearance: Normal appearance. She is normal weight. She is not ill-appearing, toxic-appearing or diaphoretic.  HENT:     Head: Normocephalic.     Right Ear: External ear normal.     Left Ear: External ear normal.     Nose: Nose normal.     Mouth/Throat:     Mouth: Mucous membranes are moist.     Pharynx: Oropharynx is clear.  Eyes:     General:        Right eye: No discharge.        Left eye: No discharge.     Extraocular Movements: Extraocular movements intact.     Conjunctiva/sclera: Conjunctivae normal.     Pupils: Pupils are equal, round, and reactive to light.  Cardiovascular:     Rate and Rhythm: Normal rate and regular rhythm.     Heart sounds: No murmur heard. Pulmonary:     Effort: Pulmonary effort is normal. No respiratory distress.     Breath sounds: Normal breath sounds. No wheezing or rales.  Abdominal:     General: Abdomen is flat. Bowel sounds are normal. There is no distension.     Palpations: Abdomen is soft.     Tenderness: There is no abdominal tenderness. There is right CVA tenderness.  There is no left CVA tenderness or guarding.  Musculoskeletal:     Cervical back: Normal range of motion and neck supple.     Lumbar back: Tenderness (tenderness over SI joint) and bony tenderness present. No swelling or edema.  Skin:    General: Skin is warm and dry.     Capillary Refill: Capillary refill takes less than 2 seconds.  Neurological:     General: No focal deficit present.     Mental Status: She is alert and oriented to person, place, and time. Mental status is at baseline.  Psychiatric:        Mood and Affect: Mood normal.        Behavior: Behavior normal.        Thought Content: Thought content normal.        Judgment: Judgment normal.    Results for orders placed or performed in visit on 06/05/21  Comp Met (CMET)  Result Value Ref Range   Glucose 91 70 - 99 mg/dL   BUN 14 8 - 27 mg/dL   Creatinine, Ser 0.89 0.57 - 1.00 mg/dL    eGFR 71 >59 mL/min/1.73   BUN/Creatinine Ratio 16 12 - 28   Sodium 145 (H) 134 - 144 mmol/L   Potassium 4.3 3.5 - 5.2 mmol/L   Chloride 105 96 - 106 mmol/L   CO2 26 20 - 29 mmol/L   Calcium 9.6 8.7 - 10.3 mg/dL   Total Protein 6.8 6.0 - 8.5 g/dL   Albumin 4.7 3.8 - 4.8 g/dL   Globulin, Total 2.1 1.5 - 4.5 g/dL   Albumin/Globulin Ratio 2.2 1.2 - 2.2   Bilirubin Total 0.2 0.0 - 1.2 mg/dL   Alkaline Phosphatase 53 44 - 121 IU/L   AST 16 0 - 40 IU/L   ALT 13 0 - 32 IU/L  Lipid Profile  Result Value Ref Range   Cholesterol, Total 168 100 - 199 mg/dL   Triglycerides 179 (H) 0 - 149 mg/dL   HDL 43 >39 mg/dL   VLDL Cholesterol Cal 31 5 - 40 mg/dL   LDL Chol Calc (NIH) 94 0 - 99 mg/dL   Chol/HDL Ratio 3.9 0.0 - 4.4 ratio  HgB A1c  Result Value Ref Range   Hgb A1c MFr Bld 5.6 4.8 - 5.6 %   Est. 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 Will give Norco 15 tabs and prednisone to help with acute pain. Recommend seeing Chiropractic care or physical therapy.  UA negative in office.  No evidence of Kidney stone or UTI on UA.  Will send for culture.       Relevant Medications   methylPREDNISolone (MEDROL DOSEPAK) 4 MG TBPK tablet   HYDROcodone-acetaminophen (NORCO) 5-325 MG tablet   Other Relevant Orders   Urinalysis, Routine w reflex microscopic   Other Visit Diagnoses     Abnormal urinalysis       Relevant Orders   Urine Culture        Follow up plan: Return if symptoms worsen or fail to improve.

## 2021-06-17 NOTE — Assessment & Plan Note (Signed)
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 Will give Norco 15 tabs and prednisone to help with acute pain. Recommend seeing Chiropractic care or physical therapy.  UA negative in office.  No evidence of Kidney stone or UTI on UA.  Will send for culture.

## 2021-06-18 NOTE — Progress Notes (Signed)
Results discussed with patient during visit.  Urine sent for culture.

## 2021-06-19 LAB — URINE CULTURE

## 2021-06-19 NOTE — Progress Notes (Signed)
Hi Brandi Steele.  The urine culture did not grow any bacteria which means there is no evidence of a UTI.  Hope you are feeling better.

## 2021-06-27 ENCOUNTER — Other Ambulatory Visit: Payer: Self-pay

## 2021-06-27 ENCOUNTER — Ambulatory Visit: Payer: Self-pay | Admitting: *Deleted

## 2021-06-27 ENCOUNTER — Emergency Department (HOSPITAL_COMMUNITY): Payer: Medicare Other

## 2021-06-27 ENCOUNTER — Ambulatory Visit: Payer: Medicare Other | Admitting: Nurse Practitioner

## 2021-06-27 ENCOUNTER — Emergency Department (HOSPITAL_COMMUNITY)
Admission: EM | Admit: 2021-06-27 | Discharge: 2021-06-27 | Disposition: A | Discharge status: Home / Self Care | Payer: Medicare Other | Attending: Emergency Medicine | Admitting: Emergency Medicine

## 2021-06-27 ENCOUNTER — Telehealth: Payer: Self-pay | Admitting: Nurse Practitioner

## 2021-06-27 ENCOUNTER — Encounter (HOSPITAL_COMMUNITY): Payer: Self-pay

## 2021-06-27 DIAGNOSIS — Z20822 Contact with and (suspected) exposure to covid-19: Secondary | ICD-10-CM | POA: Insufficient documentation

## 2021-06-27 DIAGNOSIS — Z7951 Long term (current) use of inhaled steroids: Secondary | ICD-10-CM | POA: Diagnosis not present

## 2021-06-27 DIAGNOSIS — R0602 Shortness of breath: Secondary | ICD-10-CM | POA: Diagnosis present

## 2021-06-27 DIAGNOSIS — I509 Heart failure, unspecified: Secondary | ICD-10-CM | POA: Insufficient documentation

## 2021-06-27 DIAGNOSIS — Z87891 Personal history of nicotine dependence: Secondary | ICD-10-CM | POA: Diagnosis not present

## 2021-06-27 DIAGNOSIS — J449 Chronic obstructive pulmonary disease, unspecified: Secondary | ICD-10-CM | POA: Insufficient documentation

## 2021-06-27 DIAGNOSIS — I272 Pulmonary hypertension, unspecified: Secondary | ICD-10-CM | POA: Diagnosis not present

## 2021-06-27 LAB — COMPREHENSIVE METABOLIC PANEL
ALT: 13 U/L (ref 0–44)
AST: 18 U/L (ref 15–41)
Albumin: 4.3 g/dL (ref 3.5–5.0)
Alkaline Phosphatase: 45 U/L (ref 38–126)
Anion gap: 7 (ref 5–15)
BUN: 17 mg/dL (ref 8–23)
CO2: 27 mmol/L (ref 22–32)
Calcium: 9.5 mg/dL (ref 8.9–10.3)
Chloride: 109 mmol/L (ref 98–111)
Creatinine, Ser: 0.77 mg/dL (ref 0.44–1.00)
GFR, Estimated: >60 mL/min (ref >60)
Glucose, Bld: 94 mg/dL (ref 70–99)
Potassium: 4.6 mmol/L (ref 3.5–5.1)
Sodium: 143 mmol/L (ref 135–145)
Total Bilirubin: 0.5 mg/dL (ref 0.3–1.2)
Total Protein: 7.4 g/dL (ref 6.5–8.1)

## 2021-06-27 LAB — CBC WITH DIFFERENTIAL/PLATELET
Abs Immature Granulocytes: 0.02 10*3/uL (ref 0.00–0.07)
Basophils Absolute: 0.1 10*3/uL (ref 0.0–0.1)
Basophils Relative: 1 %
Eosinophils Absolute: 0.1 10*3/uL (ref 0.0–0.5)
Eosinophils Relative: 1 %
HCT: 45.6 % (ref 36.0–46.0)
Hemoglobin: 14.5 g/dL (ref 12.0–15.0)
Immature Granulocytes: 0 %
Lymphocytes Relative: 17 %
Lymphs Abs: 1.2 10*3/uL (ref 0.7–4.0)
MCH: 29 pg (ref 26.0–34.0)
MCHC: 31.8 g/dL (ref 30.0–36.0)
MCV: 91.2 fL (ref 80.0–100.0)
Monocytes Absolute: 0.5 10*3/uL (ref 0.1–1.0)
Monocytes Relative: 7 %
Neutro Abs: 5 10*3/uL (ref 1.7–7.7)
Neutrophils Relative %: 74 %
Platelets: 139 10*3/uL — ABNORMAL LOW (ref 150–400)
RBC: 5 MIL/uL (ref 3.87–5.11)
RDW: 14.3 % (ref 11.5–15.5)
WBC: 6.9 10*3/uL (ref 4.0–10.5)
nRBC: 0 % (ref 0.0–0.2)

## 2021-06-27 LAB — BRAIN NATRIURETIC PEPTIDE: B Natriuretic Peptide: 197.8 pg/mL — ABNORMAL HIGH (ref 0.0–100.0)

## 2021-06-27 LAB — TROPONIN I (HIGH SENSITIVITY)
Troponin I (High Sensitivity): 7 ng/L (ref <18)
Troponin I (High Sensitivity): 7 ng/L (ref <18)

## 2021-06-27 LAB — SARS CORONAVIRUS 2 BY RT PCR: SARS Coronavirus 2 by RT PCR: NEGATIVE

## 2021-06-27 MED ORDER — FUROSEMIDE 10 MG/ML IJ SOLN
40.0000 mg | Freq: Once | INTRAMUSCULAR | Status: AC
  Administered 2021-06-27: 40 mg via INTRAVENOUS
  Filled 2021-06-27: qty 4

## 2021-06-27 MED ORDER — ALBUTEROL SULFATE HFA 108 (90 BASE) MCG/ACT IN AERS: 2.0000 | INHALATION_SPRAY | RESPIRATORY_TRACT | Status: DC | PRN

## 2021-06-27 MED ORDER — IOHEXOL 350 MG/ML SOLN
100.0000 mL | Freq: Once | INTRAVENOUS | Status: AC | PRN
  Administered 2021-06-27: 75 mL via INTRAVENOUS

## 2021-06-27 NOTE — ED Provider Notes (Signed)
3:35 PM Care assumed from Dr. Melina Copa.  At time of transfer care, patient is awaiting for transition of care/case manager/social work team to advise on how to get patient portable oxygen tank for discharge home.  She reported to the previous team that she did not want steroids and wanted to go home when she got the oxygen.  Awaiting disposition per case management so she can have portable oxygen.  4:07 PM Case management just called to report that the patient already has oxygen at home and she will need to follow-up with her regular doctor to get a different brand of portable oxygen concentrator ordered for her.  Per previous team's plan, and case management plan, we will discharge.   Clinical Impression: 1. SOB (shortness of breath)   2. Pulmonary hypertension (HCC)     Disposition: Discharge  Condition: Good  I have discussed the results, Dx and Tx plan with the pt(& family if present). He/she/they expressed understanding and agree(s) with the plan. Discharge instructions discussed at great length. Strict return precautions discussed and pt &/or family have verbalized understanding of the instructions. No further questions at time of discharge.    New Prescriptions   No medications on file    Follow Up: No follow-up provider specified.     Bekah Igoe, Gwenyth Allegra, MD 06/27/21 [phone]

## 2021-06-27 NOTE — Telephone Encounter (Signed)
Received notification that patient called in with chest pressure and SOB.  Triage Nurse altered our office that she was refusing the ER.  After reviewing her symptoms it is my recommendation that patient be seen in the ER for evaluation.

## 2021-06-27 NOTE — Telephone Encounter (Signed)
  Chief Complaint: shortness of breath (has COPD and severe pulmonary hypertension), dizziness since started going to chiropractor, and occasional chest pressure (not having chest pressure this morning).   Refusing ED disposition.  Requesting a specialist for pulmonary hypertension.  "I don't need to go to the ED" Symptoms: above Frequency: Dizzy when moves too fast, shortness of breath even with sitting,   Chest pressure intermittent. Pertinent Negatives: Patient denies breaking out in sweats, chest pressure this morning.    Disposition: '[x]'$ ED /'[]'$ Urgent Care (no appt availability in office) / '[]'$ Appointment(In office/virtual)/ '[]'$  [place] [facility]/ '[]'$ Home Care/ '[x]'$ Refused Recommended Disposition /'[]'$ [facility]/ '[]'$  Follow-up with PCP Additional Notes: Pt stating she doesn't need to go to the ED because she knows she has COPD and severe pulmonary hypertension.   Requesting a referral to a specialist for this. This information sent to Jon Billings, NP at [facility] and I scheduled her for today with Jon Billings, NP at 2:40 with understanding she would call 911 for s/s I went over with her.  I called into practice and spoke with Val letting her know pt is refusing ED disposition.   I sent my notes to the practice.

## 2021-06-27 NOTE — ED Triage Notes (Signed)
Pt w/ intermittent chest tightness and associated shob. Pt reports using her rescue inhaler approximately 10 times per day. Sx have gradually came on. Pt reports sx worse today.

## 2021-06-27 NOTE — Telephone Encounter (Signed)
See other phone encounter.  

## 2021-06-27 NOTE — Discharge Instructions (Addendum)
You were seen in the emergency department for evaluation of increased shortness of breath and chest tightness.  You had lab work done that did not show any evidence of heart attack and a CAT scan that did not show any blood clot.  Please continue your inhaler and we are putting in for referral to the lung specialist.  Return to the emergency department if any worsening or concerning symptoms.  Case management feels need to follow-up with your primary physician in order to get set up for the other brand of portable oxygen concentrator.  If any symptoms change or worsen, please return to the nearest Emergency Department.

## 2021-06-27 NOTE — Progress Notes (Signed)
Transition of Care [facility]) - Emergency Department Mini Assessment   Patient Details  Name: Brandi Steele MRN: [id number] Date of Birth: [birth year]  Transition of Care [facility]) CM/SW Contact:    Roseanne Kaufman, RN Phone Number: 06/27/2021, 4:05 PM   Clinical Narrative: Patient presents to [facility] with c/o chest tightness and associated shortness of breath. RNCM received TOC consult for arranging portable oxygen.   ED Mini Assessment:  RNCM received consult for portable oxygen tank. Patient states she lives with two disabled family members that she takes care of. Patient reports having oxygen concentrator at home. Patient also has a roller for her oxygen however prefers to have a portable/ fanny pack oxygen tank that is compatibale with her CPAP. Patient currently uses [employer] however does not want this RNCM to contact [employer] due to the out of pocket expense ($3200 vs. [employer] $2000). Patient plans to order her portable fanny pack oxygen from [employer] which is more cost effective. Patient reports she does not wear her oxygen on a regular basis. Patient reports she came in without the oxygen and can go home with out oxygen today. Patient wanted EDP to write for portable oxygen. This RNCM advised she needs to get the prescription from her PCP. This RNCM notified EDP. No additional TOC needs at this time.      Patient Contact and Communications  Patient: [phone]    Admission diagnosis:  Tightness when breathing, fatigue. Patient Active Problem List   Diagnosis Date Noted   Acute low back pain 06/17/2021   Right lower quadrant abdominal pain 04/01/2021   Caregiver role strain 12/05/2020   IFG (impaired fasting glucose) 09/07/2019   Carotid stenosis 12/02/2018   Abdominal aortic aneurysm (AAA) without rupture ([place]) 11/22/2018   Aortic atherosclerosis ([place]) 02/16/2017   Panic disorder 11/28/2016   Hypomagnesemia 05/02/2016   Sleep apnea 03/17/2016   CAD (coronary artery disease) 02/18/2016    Personal history of tobacco use, presenting hazards to health 01/19/2016   Essential hypertension 05/28/2015   Obesity 01/05/2015   Centrilobular emphysema ([place]) 01/05/2015   Fibromyalgia 01/05/2015   Hyperlipidemia 01/05/2015   GERD (gastroesophageal reflux disease) 01/05/2015   Sciatica 01/05/2015   CHF (congestive heart failure) ([place]) 01/05/2015   Insomnia 01/05/2015   PCP:  Jon Billings, NP Pharmacy:   [facility] [phone], [place] AT [address] [place] [address] Phone: [phone] Fax: [phone]

## 2021-06-27 NOTE — Telephone Encounter (Signed)
Reason for Disposition  Patient sounds very sick or weak to the triager    Requesting a specialist referral for pulmonary hypertension  Answer Assessment - Initial Assessment Questions 1. DESCRIPTION: "Describe your dizziness."     When I move too fast I'm dizzy.   I'm short of breath too.   I'm using my inhaler.  I have a heaviness in my chest.   I have severe pulmonary hypertension.   My dr said I was ok come back in 9 months.   I need a specialist for the pulmonary hypertension.    2. LIGHTHEADED: "Do you feel lightheaded?" (e.g., somewhat faint, woozy, weak upon standing)     *No Answer* 3. VERTIGO: "Do you feel like either you or the room is spinning or tilting?" (i.e. vertigo)     *No Answer* 4. SEVERITY: "How bad is it?"  "Do you feel like you are going to faint?" "Can you stand and walk?"   - MILD: Feels slightly dizzy, but walking normally.   - MODERATE: Feels unsteady when walking, but not falling; interferes with normal activities (e.g., school, work).   - SEVERE: Unable to walk without falling, or requires assistance to walk without falling; feels like passing out now.       5. ONSET:  "When did the dizziness begin?"     I had some back issues so I went to the chiropractor which helps with the pain.   When I'm laying down on his table and ask me to turn I'm so dizzy.  It takes a while for me to sit up form the table because Im so dizzy.    Friday I was so dizzy and having nausea. The nausea went away but I'm still short of breath and dizzy.    I'm having pressure in my chest.   I'm having to use my inhaler more often.   The pressure in my chest started but it's been there for a while. Because I have COPD and CHF.    Pt is crying and is upset and I'm so tired.   No chest pain radiation down either arm or in my back.   I have back pain anyway.  Not breaking out in sweats.   I'm short of breath even sitting and watching TV.     6. AGGRAVATING FACTORS: "Does anything make it worse?"  (e.g., standing, change in head position)     Moving too quick. 7. HEART RATE: "Can you tell me your heart rate?" "How many beats in 15 seconds?"  (Note: not all patients can do this)       Not asked 8. CAUSE: "What do you think is causing the dizziness?"     I don't know.   The dizziness started when I started going to the chiropractor.   He is aware of my dizziness.   He said I may have crystals in my ear because I'm so dizzy when I turn side to side. 9. RECURRENT SYMPTOM: "Have you had dizziness before?" If Yes, ask: "When was the last time?" "What happened that time?"     I need  dr that specializes in pulmonary hypertension.   My daughter is an Therapist, sports and suggested I get a specialist.    10. OTHER SYMPTOMS: "Do you have any other symptoms?" (e.g., fever, chest pain, vomiting, diarrhea, bleeding)       Nausea, shortness of breath and dizziness. 11. PREGNANCY: "Is there any chance you are pregnant?" "When was your last menstrual  period?"       N/A  Protocols used: Dizziness - Lightheadedness-A-AH

## 2021-06-27 NOTE — Progress Notes (Deleted)
   LMP  (LMP Unknown)    Subjective:    Patient ID: Brandi Steele, female    DOB: [birth year], 66 y.o.   MRN: [id number]  HPI: Brandi Steele is a 66 y.o. female  No chief complaint on file.  SHORTNESS OF BREATH Duration:  Onset: {Blank single:19197::"sudden","gradual"} Description of breathing discomfort:  Severity: {Blank single:19197::"mild","moderate","severe"} Episode duration:  Frequency: Related to exertion: {Blank single:19197::"yes","no"} Cough: {Blank multiple:19196::"yes","yes productive","yes non-productive","no"} Chest tightness: {Blank single:19197::"yes","no"} Wheezing: {Blank single:19197::"yes","no"} Fevers: {Blank single:19197::"yes","no"} Chest pain: {Blank single:19197::"yes","no"} Palpitations: {Blank single:19197::"yes","no"}  Nausea: {Blank single:19197::"yes","no"} Diaphoresis: {Blank single:19197::"yes","no"} Deconditioning: {Blank single:19197::"yes","no"} Status: {Blank multiple:19196::"better","worse","stable","fluctuating"} Aggravating factors:  Alleviating factors:  Treatments attempted:   Relevant past medical, surgical, family and social history reviewed and updated as indicated. Interim medical history since our last visit reviewed. Allergies and medications reviewed and updated.  Review of Systems  Per HPI unless specifically indicated above     Objective:    LMP  (LMP Unknown)   Wt Readings from Last 3 Encounters:  06/17/21 179 lb 3.2 oz (81.3 kg)  06/05/21 176 lb 9.6 oz (80.1 kg)  04/22/21 179 lb (81.2 kg)    Physical Exam  Results for orders placed or performed in visit on 06/17/21  Urine Culture   Specimen: Urine   UR  Result Value Ref Range   Urine Culture, Routine Final report    Organism ID, Bacteria Comment   Microscopic Examination   Urine  Result Value Ref Range   WBC, UA None seen 0 - 5 /hpf   RBC 0-2 0 - 2 /hpf   Epithelial Cells (non renal) 0-10 0 - 10 /hpf   Mucus, UA Present (A) Not Estab.   Bacteria,  UA Few (A) None seen/Few  Urinalysis, Routine w reflex microscopic  Result Value Ref Range   Specific Gravity, UA 1.020 1.005 - 1.030   pH, UA 6.5 5.0 - 7.5   Color, UA Yellow Yellow   Appearance Ur Clear Clear   Leukocytes,UA Trace (A) Negative   Protein,UA Negative Negative/Trace   Glucose, UA Negative Negative   Ketones, UA Negative Negative   RBC, UA Trace (A) Negative   Bilirubin, UA Negative Negative   Urobilinogen, Ur 0.2 0.2 - 1.0 mg/dL   Nitrite, UA Negative Negative   Microscopic Examination See below:       Assessment & Plan:   Problem List Items Addressed This Visit   None    Follow up plan: No follow-ups on file.

## 2021-06-27 NOTE — ED Provider Notes (Signed)
[facility] DEPT Provider Note   CSN: [id number] Arrival date & time: 06/27/21  1150     History  Chief Complaint  Patient presents with   Shortness of Breath    Brandi Steele is a 66 y.o. female.  She is a former smoker history of COPD and CHF.  Uses oxygen as needed and inhaler.  For the last week she has noticed some increased shortness of breath and intermittent chest tightness.  No cough no fevers or chills no nausea or vomiting.  No leg swelling.  She uses Lasix as needed and an inhaler.  Inhaler does not seem to be helping.  She said she had a cardiac echo a few weeks ago and was told she has severe pulmonary hypertension although was not given any change in treatment plan or follow-up for this.  She is not sure if she is just anxious because of this new finding.  She called her PCP yesterday and they recommended she come to the emergency department.  The history is provided by the patient.  Shortness of Breath Severity:  Moderate Onset quality:  Gradual Duration:  1 week Timing:  Intermittent Progression:  Unchanged Chronicity:  New Relieved by:  Nothing Worsened by:  Activity Ineffective treatments:  Inhaler and oxygen Associated symptoms: chest pain   Associated symptoms: no abdominal pain, no cough, no diaphoresis, no fever, no headaches, no hemoptysis, no neck pain, no rash, no sore throat, no sputum production, no syncope and no wheezing   Risk factors: no tobacco use       Home Medications Prior to Admission medications   Medication Sig Start Date End Date Taking? Authorizing Provider  albuterol (VENTOLIN HFA) 108 (90 Base) MCG/ACT inhaler INHALE 2 PUFFS INTO THE LUNGS EVERY 6 HOURS AS NEEDED FOR WHEEZING OR SHORTNESS OF BREATH 05/25/20   Jon Billings, NP  ALPRAZolam Duanne Moron) 0.5 MG tablet Take 1 tablet (0.5 mg total) by mouth 2 (two) times daily as needed. for anxiety 12/05/20   Jon Billings, NP  baclofen (LIORESAL) 10 MG  tablet TAKE 1 TABLET(10 MG) BY MOUTH THREE TIMES DAILY 06/05/21   Jon Billings, NP  budesonide-formoterol [facility]) 160-4.5 MCG/ACT inhaler Inhale 2 puffs into the lungs 2 (two) times daily. 12/05/20   Jon Billings, NP  Calcium Carbonate-Vit D-Min (CALCIUM 1200 PO) Take by mouth daily.    [provider]  cetirizine (ZYRTEC) 10 MG tablet Take 1 tablet (10 mg total) by mouth daily. 03/03/19   Volney American, PA-C  clopidogrel (PLAVIX) 75 MG tablet TAKE 1 TABLET(75 MG) BY MOUTH DAILY 04/02/21   Jon Billings, NP  esomeprazole (NEXIUM) 40 MG capsule Take 1 tab two times daily. 12/05/20   Jon Billings, NP  furosemide (LASIX) 40 MG tablet Take 1 tablet (40 mg total) by mouth daily as needed. Patient not taking: Reported on 06/05/2021 09/07/19   Volney American, PA-C  HYDROcodone-acetaminophen [facility]) 5-325 MG tablet Take 1 tablet by mouth every 6 (six) hours as needed for moderate pain. 06/17/21   Jon Billings, NP  hydrOXYzine (ATARAX) 25 MG tablet TAKE 1 TABLET(25 MG) BY MOUTH THREE TIMES DAILY AS NEEDED 01/30/21   Jon Billings, NP  lisinopril (ZESTRIL) 2.5 MG tablet TAKE 1 TABLET(2.5 MG) BY MOUTH DAILY 04/02/21   Jon Billings, NP  Magnesium 400 MG CAPS Take by mouth. Take 6 tablets daily    [provider]  meloxicam (MOBIC) 15 MG tablet TAKE 1 TABLET(15 MG) BY MOUTH DAILY 12/05/20  Jon Billings, NP  methylPREDNISolone (MEDROL DOSEPAK) 4 MG TBPK tablet Take as directed 06/17/21   Jon Billings, NP  Potassium Gluconate 595 MG CAPS Take by mouth. Take 4 capsules daily    [provider]  rosuvastatin (CRESTOR) 10 MG tablet TAKE 1 TABLET(10 MG) BY MOUTH DAILY 01/01/21   Jon Billings, NP  triamcinolone cream (KENALOG) 0.1 % Apply 1 application. topically 2 (two) times daily. 04/22/21   Jon Billings, NP      Allergies    Morphine and related, Celexa [citalopram hydrobromide], Effexor [venlafaxine], Lipitor [atorvastatin],  Shellfish allergy, and Wellbutrin [bupropion]    Review of Systems   Review of Systems  Constitutional:  Negative for diaphoresis and fever.  HENT:  Negative for sore throat.   Eyes:  Negative for visual disturbance.  Respiratory:  Positive for shortness of breath. Negative for cough, hemoptysis, sputum production and wheezing.   Cardiovascular:  Positive for chest pain. Negative for leg swelling and syncope.  Gastrointestinal:  Negative for abdominal pain.  Genitourinary:  Negative for dysuria.  Musculoskeletal:  Negative for neck pain.  Skin:  Negative for rash.  Neurological:  Negative for headaches.   Physical Exam Updated Vital Signs BP (!) 142/74 (BP Location: Right Arm)   Pulse 77   Temp 99.8 F (37.7 C) (Oral)   Resp 14   Ht 5' 5.5" (1.664 m)   Wt 79.8 kg   LMP  (LMP Unknown)   SpO2 96%   BMI 28.84 kg/m  Physical Exam Vitals and nursing note reviewed.  Constitutional:      General: She is not in acute distress.    Appearance: She is well-developed.  HENT:     Head: Normocephalic and atraumatic.  Eyes:     Conjunctiva/sclera: Conjunctivae normal.  Cardiovascular:     Rate and Rhythm: Normal rate and regular rhythm.     Heart sounds: No murmur heard. Pulmonary:     Effort: Pulmonary effort is normal. No respiratory distress.     Breath sounds: Normal breath sounds.  Abdominal:     Palpations: Abdomen is soft. There is no mass.     Tenderness: There is no abdominal tenderness. There is no guarding or rebound.  Musculoskeletal:        General: No swelling. Normal range of motion.     Cervical back: Neck supple.     Right lower leg: No tenderness. No edema.     Left lower leg: No tenderness. No edema.  Skin:    General: Skin is warm and dry.     Capillary Refill: Capillary refill takes less than 2 seconds.  Neurological:     General: No focal deficit present.     Mental Status: She is alert.    ED Results / Procedures / Treatments   Labs (all labs  ordered are listed, but only abnormal results are displayed) Labs Reviewed  BRAIN NATRIURETIC PEPTIDE - Abnormal; Notable for the following components:      Result Value   B Natriuretic Peptide 197.8 (*)    All other components within normal limits  CBC WITH DIFFERENTIAL/PLATELET - Abnormal; Notable for the following components:   Platelets 139 (*)    All other components within normal limits  SARS CORONAVIRUS 2 BY RT PCR  COMPREHENSIVE METABOLIC PANEL  TROPONIN I (HIGH SENSITIVITY)  TROPONIN I (HIGH SENSITIVITY)    EKG EKG Interpretation  Date/Time:  Thursday June 27 2021 11:59:22 EDT Ventricular Rate:  74 PR Interval:  157 QRS Duration:  143 QT Interval:  431 QTC Calculation: 479 R Axis:   267 Text Interpretation: Sinus rhythm Premature atrial complexes Consider left atrial enlargement 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 Former smoker and current vaping use, COPD, CHF EXAM: CHEST - 2 VIEW COMPARISON:  05/01/2016; CT chest lung cancer screening 03/22/2020 FINDINGS: Normal heart size. Prominent central pulmonary arteries unchanged from prior CT. Lungs clear. No pulmonary infiltrate, pleural effusion, or pneumothorax. No acute osseous findings. IMPRESSION: Prominent central pulmonary arteries, could reflect pulmonary arterial hypertension. No acute infiltrate. Electronically Signed   By: Lavonia Dana M.D.   On: 06/27/2021 12:47   CT Angio Chest PE W/Cm &/Or Wo Cm  Result Date: 06/27/2021 CLINICAL DATA:  Intermittent chest tightness.  Shortness of breath. EXAM: CT ANGIOGRAPHY CHEST WITH CONTRAST TECHNIQUE: Multidetector CT imaging of the chest was performed using the standard protocol during bolus administration of  intravenous contrast. Multiplanar CT image reconstructions and MIPs were obtained to evaluate the vascular anatomy. RADIATION DOSE REDUCTION: This exam was performed according to the departmental dose-optimization program which includes automated exposure control, adjustment of the mA and/or kV according to patient size and/or use of iterative reconstruction technique. CONTRAST:  59m OMNIPAQUE IOHEXOL 350 MG/ML SOLN COMPARISON:  Chest radiograph 06/27/2021 and CT chest 03/22/2020 FINDINGS: Cardiovascular: No filling defect is identified in the pulmonary arterial tree to suggest pulmonary embolus. Atherosclerotic calcification of the aortic arch. Mediastinum/Nodes: Scattered small mediastinal lymph nodes are generally unchanged; a borderline prominent left infrahilar lymph node measures 1.0 cm in short axis on image 56 series 4, formerly the same. Lungs/Pleura: Centrilobular emphysema. Chronically stable 2 mm right lower lobe nodule on image 59 series 6. Mild left lower lobe scarring along the hemidiaphragm. Upper Abdomen: Abdominal aortic atherosclerosis. Musculoskeletal: Chronic inferior endplate compression at T7, no change from prior. Review of the MIP images confirms the above findings. IMPRESSION: 1. No filling defect is identified in the pulmonary arterial tree to suggest pulmonary embolus. 2. Other imaging findings of potential clinical significance: Aortic Atherosclerosis (ICD10-I70.0). Emphysema (ICD10-J43.9). Chronic T7 inferior endplate compression. Stable borderline prominent left infrahilar lymph node. Electronically Signed   By: WVan ClinesM.D.   On: 06/27/2021 15:05    Procedures Procedures    Medications Ordered in ED Medications  albuterol (VENTOLIN HFA) 108 (90 Base) MCG/ACT inhaler 2 puff (has no administration in time range)  furosemide (LASIX) injection 40 mg (40 mg Intravenous Given 06/27/21 1349)  iohexol (OMNIPAQUE) 350 MG/ML injection 100 mL (75 mLs Intravenous Contrast  Given 06/27/21 1445)    ED Course/ Medical Decision Making/ A&P Clinical Course as of 06/27/21 1731  Thu Jun 27, 2021  1254 Chest x-ray does not show any acute infiltrates.  Awaiting radiology reading. [MB]  1504 Reviewed results of work-up with patient.  She is asking to get set up with some portable oxygen as her sats drop in the house.  We will put in a consult for TOC to help with that. [MB]  1522 Patient states she does not want to go back on a course of steroids.  She feels she can manage her symptoms.  Have put a referral in for pulmonology outpatient. [MB]    Clinical Course User Index [MB] BHayden Rasmussen MD  Medical Decision Making Amount and/or Complexity of Data Reviewed Labs: ordered. Radiology: ordered.  Risk Prescription drug management.  BRINLYNN GORTON was evaluated in Emergency Department on 06/27/2021 for the symptoms described in the history of present illness. She was evaluated in the context of the global COVID-19 pandemic, which necessitated consideration that the patient might be at risk for infection with the SARS-CoV-2 virus that causes COVID-19. Institutional protocols and algorithms that pertain to the evaluation of patients at risk for COVID-19 are in a state of rapid change based on information released by regulatory bodies including the CDC and federal and state organizations. These policies and algorithms were followed during the patient's care in the ED. This patient complains of shortness of breath pulmonary hypertension; this involves an extensive number of treatment Options and is a complaint that carries with it a high risk of complications and morbidity. The differential includes COPD, CHF, pneumonia, COVID, flu, pulmonary hypertension, PE  I ordered, reviewed and interpreted labs, which included CBC with normal white count normal hemoglobin, chemistries and LFTs normal, BNP mildly elevated, troponins flat, COVID-negative I  ordered medication IV Lasix and reviewed PMP when indicated. I ordered imaging studies which included chest x-ray and CT angio and I independently    visualized and interpreted imaging which showed no evidence of PE.  Previous records obtained and reviewed in epic including recent PCP visits Cardiac monitoring reviewed, normal sinus rhythm Social determinants considered, patient with significant stressors and decreased physical activity Critical Interventions: None  After the interventions stated above, I reevaluated the patient and found oxygenating well and comfortable appearing on room air Admission and further testing considered, no indications for admission at this time.  She is interested in a portable oxygen so I have asked social work to consult on patient.  Her care is signed out to Dr. Albertina Senegal to follow-up on social work recommendations.          Final Clinical Impression(s) / ED Diagnoses Final diagnoses:  SOB (shortness of breath)  Pulmonary hypertension [facility])    Rx / DC Orders ED Discharge Orders          Ordered    Ambulatory referral to Pulmonology       Comments: Pulmonary HTN   06/27/21 1504              Hayden Rasmussen, MD 06/27/21 1734

## 2021-06-27 NOTE — Telephone Encounter (Signed)
Patient has been notified of ED recommendations per Jon Billings. Patient has been advised to go to the nearest ED or of ED of choice ( pt stated she did not want to go to [facility]) but was advised if sxs are worsening she needs to go to the nearest ED. Pt has been notified of referral as well. Pt states she will go to ED.

## 2021-06-30 ENCOUNTER — Other Ambulatory Visit: Payer: Self-pay | Admitting: Nurse Practitioner

## 2021-07-01 ENCOUNTER — Ambulatory Visit: Payer: Medicare Other | Admitting: Gastroenterology

## 2021-07-01 NOTE — Telephone Encounter (Signed)
Requested Prescriptions  Pending Prescriptions Disp Refills  . lisinopril (ZESTRIL) 2.5 MG tablet [Pharmacy Med Name: LISINOPRIL 2.'5MG'$  TABLETS] 90 tablet 0    Sig: TAKE 1 TABLET(2.5 MG) BY MOUTH DAILY     Cardiovascular:  ACE Inhibitors Passed - 06/30/2021  7:06 AM      Passed - Cr in normal range and within 180 days    Creatinine  Date Value Ref Range Status  06/16/2013 0.63 0.60 - 1.30 mg/dL Final   Creatinine, Ser  Date Value Ref Range Status  06/27/2021 0.77 0.44 - 1.00 mg/dL Final         Passed - K in normal range and within 180 days    Potassium  Date Value Ref Range Status  06/27/2021 4.6 3.5 - 5.1 mmol/L Final  06/16/2013 4.0 3.5 - 5.1 mmol/L Final         Passed - Patient is not pregnant      Passed - Last BP in normal range    BP Readings from Last 1 Encounters:  06/27/21 121/78         Passed - Valid encounter within last 6 months    Recent Outpatient Visits          2 weeks ago Acute low back pain, unspecified back pain laterality, unspecified whether sciatica present   [place], NP   3 weeks ago Essential hypertension   [facility] Jon Billings, NP   2 months ago Right lower quadrant abdominal pain   [place], NP   3 months ago Right lower quadrant abdominal pain   [facility] Vigg, Avanti, MD   3 months ago Screening for colon cancer   [facility] Jon Billings, NP      Future Appointments            In 1 week Agbor-Etang, Aaron Edelman, MD [facility]   In 2 months Ralene Bathe, MD [place]   In 3 months  [facility], [place]

## 2021-07-03 ENCOUNTER — Telehealth: Payer: Self-pay | Admitting: Nurse Practitioner

## 2021-07-03 NOTE — Telephone Encounter (Signed)
Please let patient know that the best way to receive support for smoking cessation is to call her insurance company.  They typically offer free counseling and medications.

## 2021-07-03 NOTE — Telephone Encounter (Signed)
Pt advised of Karen's message below. No further questions at this time per patient.

## 2021-07-12 ENCOUNTER — Encounter: Payer: Self-pay | Admitting: Cardiology

## 2021-07-12 ENCOUNTER — Ambulatory Visit (INDEPENDENT_AMBULATORY_CARE_PROVIDER_SITE_OTHER): Payer: Medicare Other | Admitting: Cardiology

## 2021-07-12 VITALS — BP 144/76 | HR 80 | Ht 65.6 in | Wt 179.0 lb

## 2021-07-12 DIAGNOSIS — I272 Pulmonary hypertension, unspecified: Secondary | ICD-10-CM | POA: Diagnosis not present

## 2021-07-12 DIAGNOSIS — I2510 Atherosclerotic heart disease of native coronary artery without angina pectoris: Secondary | ICD-10-CM | POA: Diagnosis not present

## 2021-07-12 DIAGNOSIS — I10 Essential (primary) hypertension: Secondary | ICD-10-CM | POA: Diagnosis not present

## 2021-07-12 DIAGNOSIS — E785 Hyperlipidemia, unspecified: Secondary | ICD-10-CM | POA: Diagnosis not present

## 2021-07-12 MED ORDER — ROSUVASTATIN CALCIUM 20 MG PO TABS: 20.0000 mg | ORAL_TABLET | Freq: Every day | ORAL | 1 refills | Status: DC

## 2021-07-12 NOTE — Patient Instructions (Signed)
Medication Instructions:   Your physician has recommended you make the following change in your medication:    INCREASE your Rosuvastatin to 20 MG once a day.   *If you need a refill on your cardiac medications before your next appointment, please call your pharmacy*   Testing/Procedures:  Your physician has requested that you have an echocardiogram. Echocardiography is a painless test that uses sound waves to create images of your heart. It provides your doctor with information about the size and shape of your heart and how well your heart's chambers and valves are working. This procedure takes approximately one hour. There are no restrictions for this procedure.    Follow-Up: At [facility], you and your health needs are our priority.  As part of our continuing mission to provide you with exceptional heart care, we have created designated Provider Care Teams.  These Care Teams include your primary Cardiologist (physician) and Advanced Practice Providers (APPs -  Physician Assistants and Nurse Practitioners) who all work together to provide you with the care you need, when you need it.  We recommend signing up for the patient portal called "MyChart".  Sign up information is provided on this After Visit Summary.  MyChart is used to connect with patients for Virtual Visits (Telemedicine).  Patients are able to view lab/test results, encounter notes, upcoming appointments, etc.  Non-urgent messages can be sent to your provider as well.   To learn more about what you can do with MyChart, go to [email or web address].    Your next appointment:   Follow up after Echo   The format for your next appointment:   In Person  Provider:    ONLY WITH Kate Sable, MD    Other Instructions   Important Information About [place]

## 2021-07-12 NOTE — Progress Notes (Signed)
Cardiology Office Note:    Date:  07/12/2021   ID:  Brandi Steele, DOB [birth year], MRN [id number]  PCP:  Jon Billings, NP   [facility] Providers Cardiologist:  Kate Sable, MD     Referring MD: Jon Billings, NP   No chief complaint on file.   History of Present Illness:    Brandi Steele is a 66 y.o. female with a hx of CAD (RCA calcium on chest CT), hypertension, hyperlipidemia, former smoker x40+ years, COPD, OSA on CPAP, PAD s/p abdominal aortic surgery over 20 years ago who presents to establish care.  Previously seen by Brandi Steele cardiology from a cardiac perspective.  Recently diagnosed with pulmonary hypertension.  Previously smoked for over 40 years.  Was vaping/using e-cigarettes after smoking cessation, had some chest discomfort which has stopped since quitting smoking and e-cigarette use.  Denies edema, takes Lasix only as needed has not required Lasix over the past several weeks.  Has an appointment in 3 days with pulmonary medicine/pulmonary hypertension specialist in [place].  Has been on Plavix and Crestor ever since her abdominal aortic surgery.  Blood pressures is well controlled at home.  Outside echocardiogram 03/2021 normal EF>55% , moderate TR, severe pulmonary hypertension RVSP 56mHg.  Treadmill stress test 03/2021 showed no significant ischemic EKG changes.  Past Medical History:  Diagnosis Date   Anxiety    Aortic stenosis    CHF (congestive heart failure) (HCC)    COPD (chronic obstructive pulmonary disease) (HCC)    Depression    Fibromyalgia    GERD (gastroesophageal reflux disease)    Hyperlipidemia    Hypertension    Hypertensive heart disease    Myalgia    Obesity    Sciatica    Seborrheic keratosis    Stress incontinence     Past Surgical History:  Procedure Laterality Date   TOTAL ABDOMINAL HYSTERECTOMY     partial    Current Medications: Current Meds  Medication Sig   albuterol (VENTOLIN HFA) 108 (90 Base) MCG/ACT  inhaler INHALE 2 PUFFS INTO THE LUNGS EVERY 6 HOURS AS NEEDED FOR WHEEZING OR SHORTNESS OF BREATH   ALPRAZolam (XANAX) 0.5 MG tablet Take 1 tablet (0.5 mg total) by mouth 2 (two) times daily as needed. for anxiety   baclofen (LIORESAL) 10 MG tablet TAKE 1 TABLET(10 MG) BY MOUTH THREE TIMES DAILY   budesonide-formoterol (SYMBICORT) 160-4.5 MCG/ACT inhaler Inhale 2 puffs into the lungs 2 (two) times daily.   Calcium Carbonate-Vit D-Min (CALCIUM 1200 PO) Take by mouth daily.   cetirizine (ZYRTEC) 10 MG tablet Take 1 tablet (10 mg total) by mouth daily.   clopidogrel (PLAVIX) 75 MG tablet TAKE 1 TABLET(75 MG) BY MOUTH DAILY   esomeprazole (NEXIUM) 40 MG capsule Take 1 tab two times daily.   furosemide (LASIX) 40 MG tablet Take 1 tablet (40 mg total) by mouth daily as needed.   HYDROcodone-acetaminophen (NORCO) 5-325 MG tablet Take 1 tablet by mouth every 6 (six) hours as needed for moderate pain.   hydrOXYzine (ATARAX) 25 MG tablet TAKE 1 TABLET(25 MG) BY MOUTH THREE TIMES DAILY AS NEEDED   lisinopril (ZESTRIL) 2.5 MG tablet TAKE 1 TABLET(2.5 MG) BY MOUTH DAILY   Magnesium 400 MG CAPS Take by mouth. Take 6 tablets daily   meloxicam (MOBIC) 15 MG tablet TAKE 1 TABLET(15 MG) BY MOUTH DAILY   Potassium Gluconate 595 MG CAPS Take by mouth. Take 4 capsules daily   [DISCONTINUED] rosuvastatin (CRESTOR) 10 MG tablet TAKE 1 TABLET(10 MG)  BY MOUTH DAILY     Allergies:   Morphine and related, Celexa [citalopram hydrobromide], Effexor [venlafaxine], Lipitor [atorvastatin], Shellfish allergy, and Wellbutrin [bupropion]   Social History   Socioeconomic History   Marital status: Married    Spouse name: Not on file   Number of children: Not on file   Years of education: Not on file   Highest education level: Not on file  Occupational History   Not on file  Tobacco Use   Smoking status: Former    Packs/day: 1.00    Years: 40.00    Total pack years: 40.00    Types: Cigarettes    Quit date: 11/01/2016     Years since quitting: 4.6   Smokeless tobacco: Never   Tobacco comments:    Is trying cold-turkey with social support  Vaping Use   Vaping Use: Former  Substance and Sexual Activity   Alcohol use: No    Alcohol/week: 0.0 standard drinks of alcohol   Drug use: No   Sexual activity: Not Currently  Other Topics Concern   Not on file  Social History Narrative   Not on file   Social Determinants of Health   Financial Resource Strain: Low Risk  (10/19/2020)   Overall Financial Resource Strain (CARDIA)    Difficulty of Paying Living Expenses: Not hard at all  Food Insecurity: No Food Insecurity (10/19/2020)   Hunger Vital Sign    Worried About Running Out of Food in the Last Year: Never true    Ran Out of Food in the Last Year: Never true  Transportation Needs: No Transportation Needs (10/19/2020)   PRAPARE - Hydrologist (Medical): No    Lack of Transportation (Non-Medical): No  Physical Activity: Inactive (10/19/2020)   Exercise Vital Sign    Days of Exercise per Week: 0 days    Minutes of Exercise per Session: 0 min  Stress: Stress Concern Present (10/19/2020)   [place]    Feeling of Stress : Very much  Social Connections: Not on file     Family History: [name] family history includes Diabetes in her brother; Heart disease in her father, paternal grandfather, and paternal grandmother; Hypertension in her mother; Stroke in her maternal grandfather, maternal grandmother, and mother. There is no history of Breast cancer.  ROS:   Please see the history of present illness.     All other systems reviewed and are negative.  EKGs/Labs/Other Studies Reviewed:    The following studies were reviewed today:   EKG:  EKG is  ordered today.  The ekg ordered today demonstrates normal sinus rhythm, right bundle branch block  Recent Labs: 12/05/2020: TSH 1.620 06/05/2021: Magnesium  1.8 06/27/2021: ALT 13; B Natriuretic Peptide 197.8; BUN 17; Creatinine, Ser 0.77; Hemoglobin 14.5; Platelets 139; Potassium 4.6; Sodium 143  Recent Lipid Panel    Component Value Date/Time   CHOL 168 06/05/2021 1438   CHOL 142 01/05/2015 1105   TRIG 179 (H) 06/05/2021 1438   TRIG 138 01/05/2015 1105   HDL 43 06/05/2021 1438   CHOLHDL 3.9 06/05/2021 1438   VLDL 28 01/05/2015 1105   LDLCALC 94 06/05/2021 1438     Risk Assessment/Calculations:          Physical Exam:    VS:  BP (!) 144/76   Pulse 80   Ht 5' 5.6" (1.666 m)   Wt 179 lb (81.2 kg)   LMP  (LMP Unknown)  SpO2 93%   BMI 29.24 kg/m     Wt Readings from Last 3 Encounters:  07/12/21 179 lb (81.2 kg)  06/27/21 176 lb (79.8 kg)  06/17/21 179 lb 3.2 oz (81.3 kg)     GEN:  Well nourished, well developed in no acute distress HEENT: Normal NECK: No JVD; No carotid bruits LYMPHATICS: No lymphadenopathy CARDIAC: RRR, no murmurs, rubs, gallops RESPIRATORY: Diminished breath sounds, no wheezing. ABDOMEN: Soft, non-tender, non-distended MUSCULOSKELETAL:  No edema; No deformity  SKIN: Warm and dry NEUROLOGIC:  Alert and oriented x 3 PSYCHIATRIC:  Normal affect   ASSESSMENT:    1. Coronary artery disease involving native heart without angina pectoris, unspecified vessel or lesion type   2. Hyperlipidemia LDL goal <70   3. Pulmonary hypertension, unspecified ([place])   4. Primary hypertension    PLAN:    In order of problems listed above:  CAD, denies chest pain or shortness of breath.  Get echocardiogram, continue Plavix, increase Crestor to 20 mg daily. Hyperlipidemia, LDL not at goal.  Increase Crestor to 20 milligrams daily. Pulmonary hypertension, echocardiogram as above, keep appointment with pulmonary hypertension specialist in 3 days.  Denies edema, continue Lasix as needed. Hypertension, BP elevated, usually controlled.  Continue lisinopril.  Follow-up after echocardiogram.       Medication  Adjustments/Labs and Tests Ordered: Current medicines are reviewed at length with the patient today.  Concerns regarding medicines are outlined above.  Orders Placed This Encounter  Procedures   EKG 12-Lead   ECHOCARDIOGRAM COMPLETE   Meds ordered this encounter  Medications   rosuvastatin (CRESTOR) 20 MG tablet    Sig: Take 1 tablet (20 mg total) by mouth daily.    Dispense:  90 tablet    Refill:  1    Patient Instructions  Medication Instructions:   Your physician has recommended you make the following change in your medication:    INCREASE your Rosuvastatin to 20 MG once a day.   *If you need a refill on your cardiac medications before your next appointment, please call your pharmacy*   Testing/Procedures:  Your physician has requested that you have an echocardiogram. Echocardiography is a painless test that uses sound waves to create images of your heart. It provides your doctor with information about the size and shape of your heart and how well your heart's chambers and valves are working. This procedure takes approximately one hour. There are no restrictions for this procedure.    Follow-Up: At [facility], you and your health needs are our priority.  As part of our continuing mission to provide you with exceptional heart care, we have created designated Provider Care Teams.  These Care Teams include your primary Cardiologist (physician) and Advanced Practice Providers (APPs -  Physician Assistants and Nurse Practitioners) who all work together to provide you with the care you need, when you need it.  We recommend signing up for the patient portal called "MyChart".  Sign up information is provided on this After Visit Summary.  MyChart is used to connect with patients for Virtual Visits (Telemedicine).  Patients are able to view lab/test results, encounter notes, upcoming appointments, etc.  Non-urgent messages can be sent to your provider as well.   To learn more about  what you can do with MyChart, go to [email or web address].    Your next appointment:   Follow up after Echo   The format for your next appointment:   In Person  Provider:    [place]  Garen Lah, MD    Other Instructions   Important Information About Sugar         Signed, Kate Sable, MD  07/12/2021 5:11 PM    [place]

## 2021-07-15 ENCOUNTER — Ambulatory Visit (INDEPENDENT_AMBULATORY_CARE_PROVIDER_SITE_OTHER): Payer: Medicare Other | Admitting: Pulmonary Disease

## 2021-07-15 ENCOUNTER — Encounter: Payer: Self-pay | Admitting: Pulmonary Disease

## 2021-07-15 VITALS — BP 128/64 | HR 54 | Ht 65.0 in | Wt 178.6 lb

## 2021-07-15 DIAGNOSIS — G4733 Obstructive sleep apnea (adult) (pediatric): Secondary | ICD-10-CM | POA: Diagnosis not present

## 2021-07-15 NOTE — Patient Instructions (Signed)
Nice to meet you  Pulmonary hypertension if present I suspect there are signs of this was going on back as far as 2015.  The fact that your symptoms or shortness of breath is not getting worse over time makes me think that the pulmonary hypertension may not be very consequential as of now if it is present at all.  If you have pulm hypertension, it is most likely related to emphysema and sleep apnea that is well treated as well as a little bit of fluid buildup over time from the left side of the heart that pumps to the rest of the body, building up to the right side of the heart that pumps to the lung.  In general, this type of pulm hypertension is very slowly progressing.  Lets see what the heart ultrasound or echocardiogram shows next month.  We will follow-up shortly thereafter to discuss any next steps that need to be taken based on the result.  Return to clinic in 4 weeks after echocardiogram follow-up with Dr. Silas Flood

## 2021-07-22 ENCOUNTER — Encounter: Payer: Self-pay | Admitting: Gastroenterology

## 2021-07-22 ENCOUNTER — Ambulatory Visit (INDEPENDENT_AMBULATORY_CARE_PROVIDER_SITE_OTHER): Payer: Medicare Other | Admitting: Gastroenterology

## 2021-07-22 VITALS — BP 120/70 | HR 69 | Temp 97.0°F | Wt 180.0 lb

## 2021-07-22 DIAGNOSIS — M7918 Myalgia, other site: Secondary | ICD-10-CM | POA: Diagnosis not present

## 2021-07-22 DIAGNOSIS — Z1211 Encounter for screening for malignant neoplasm of colon: Secondary | ICD-10-CM

## 2021-07-22 MED ORDER — NA SULFATE-K SULFATE-MG SULF 17.5-3.13-1.6 GM/177ML PO SOLN: 354.0000 mL | Freq: Once | ORAL | 0 refills | Status: AC

## 2021-07-22 NOTE — Progress Notes (Signed)
$'[name]$  ID: Brandi Steele, female    DOB: 1955-11-09, 66 y.o.   MRN: [id number]  Chief Complaint  Patient presents with   Consult    Pt is here for consult for pulm htn. Pt states she was dx with this in may 2023. Pt states that she does take Albuterol as needed. Stopped vaping last week. Cpap at night. Pt states her oxygen does drop when she was in the hospital.     Referring provider: Hayden Rasmussen, MD  HPI:   66 y.o. woman whom are seen in consultation for evaluation of pulmonary hypertension.  Most recent cardiology note appears the current system reviewed.  Previous to most recent cardiology notes via care everywhere reviewed.  Patient has chronic dyspnea.  Largely unchanged.  Over the last several years.  Been followed by cardiologist in the [place] system.  Followed with coronary artery disease.  Serial echocardiograms over the years.  Most recently she was called and informed via the phone that there is pulmonary hypertension.  No further discussion.  Normal routine follow-up scheduled.  She was not pleased with this.  This prompted self-referral here for evaluation of pulmonary hypertension.  Revealed serial echocardiograms had been in 2015.  In 2015 she had enlarged LA, enlarged RA, enlarged RV with normal RV function.  Similar-appearing echo in 2018.  Most recent echo 03/2021 reviewed which shows similar findings with reported elevated estimated RVSP.  Estimated RVSP not really commented on in the past.  She reports desaturations in the past.  Intermittent.  High 80s.  She denies any significant desaturations otherwise.  Most recent chest x-ray 6/1 personally reviewed and interpreted as clear.  CTA PE protocol same day personally reviewed with mild emphysematous changes, stable 2 mm right lower lobe nodule on my interpretation.  PMH: Anxiety, GERD, CAD, hyperlipidemia Surgical history: Hysterectomy Family history: Father with CAD, with CAD, CVA Social history: Former smoker,  40-pack-year, reportedly quit 2019, lives in [place] / Pulmonary Flowsheets:   ACT:      No data to display          MMRC:     No data to display          Epworth:      No data to display          Tests:   FENO:  No results found for: "NITRICOXIDE"  PFT:     No data to display          WALK:      No data to display          Imaging: Personally reviewed and as per EMR and discussion in this note CT Angio Chest PE W/Cm &/Or Wo Cm  Result Date: 06/27/2021 CLINICAL DATA:  Intermittent chest tightness.  Shortness of breath. EXAM: CT ANGIOGRAPHY CHEST WITH CONTRAST TECHNIQUE: Multidetector CT imaging of the chest was performed using the standard protocol during bolus administration of intravenous contrast. Multiplanar CT image reconstructions and MIPs were obtained to evaluate the vascular anatomy. RADIATION DOSE REDUCTION: This exam was performed according to the departmental dose-optimization program which includes automated exposure control, adjustment of the mA and/or kV according to patient size and/or use of iterative reconstruction technique. CONTRAST:  49m OMNIPAQUE IOHEXOL 350 MG/ML SOLN COMPARISON:  Chest radiograph 06/27/2021 and CT chest 03/22/2020 FINDINGS: Cardiovascular: No filling defect is identified in the pulmonary arterial tree to suggest pulmonary embolus. Atherosclerotic calcification of the aortic arch. Mediastinum/Nodes: Scattered small mediastinal lymph nodes  are generally unchanged; a borderline prominent left infrahilar lymph node measures 1.0 cm in short axis on image 56 series 4, formerly the same. Lungs/Pleura: Centrilobular emphysema. Chronically stable 2 mm right lower lobe nodule on image 59 series 6. Mild left lower lobe scarring along the hemidiaphragm. Upper Abdomen: Abdominal aortic atherosclerosis. Musculoskeletal: Chronic inferior endplate compression at T7, no change from prior. Review of the MIP images confirms  the above findings. IMPRESSION: 1. No filling defect is identified in the pulmonary arterial tree to suggest pulmonary embolus. 2. Other imaging findings of potential clinical significance: Aortic Atherosclerosis (ICD10-I70.0). Emphysema (ICD10-J43.9). Chronic T7 inferior endplate compression. Stable borderline prominent left infrahilar lymph node. Electronically Signed   By: Van Clines M.D.   On: 06/27/2021 15:05   DG Chest 2 View  Result Date: 06/27/2021 CLINICAL DATA:  Shortness of breath, intermittent chest tightness, reports using her rescue inhaler of approximately 10 times per day, worsened symptoms today. Former smoker and current vaping use, COPD, CHF EXAM: CHEST - 2 VIEW COMPARISON:  05/01/2016; CT chest lung cancer screening 03/22/2020 FINDINGS: Normal heart size. Prominent central pulmonary arteries unchanged from prior CT. Lungs clear. No pulmonary infiltrate, pleural effusion, or pneumothorax. No acute osseous findings. IMPRESSION: Prominent central pulmonary arteries, could reflect pulmonary arterial hypertension. No acute infiltrate. Electronically Signed   By: Lavonia Dana M.D.   On: 06/27/2021 12:47    Lab Results: Personally reviewed CBC    Component Value Date/Time   WBC 6.9 06/27/2021 1213   RBC 5.00 06/27/2021 1213   HGB 14.5 06/27/2021 1213   HGB 13.9 12/05/2020 0855   HCT 45.6 06/27/2021 1213   HCT 43.1 12/05/2020 0855   PLT 139 (L) 06/27/2021 1213   PLT 164 12/05/2020 0855   MCV 91.2 06/27/2021 1213   MCV 91 12/05/2020 0855   MCV 93 06/16/2013 1946   MCH 29.0 06/27/2021 1213   MCHC 31.8 06/27/2021 1213   RDW 14.3 06/27/2021 1213   RDW 13.5 12/05/2020 0855   RDW 15.0 (H) 06/16/2013 1946   LYMPHSABS 1.2 06/27/2021 1213   LYMPHSABS 1.4 12/05/2020 0855   LYMPHSABS 2.4 06/16/2013 1946   MONOABS 0.5 06/27/2021 1213   MONOABS 0.6 06/16/2013 1946   EOSABS 0.1 06/27/2021 1213   EOSABS 0.1 12/05/2020 0855   EOSABS 0.1 06/16/2013 1946   BASOSABS 0.1 06/27/2021  1213   BASOSABS 0.1 12/05/2020 0855   BASOSABS 0.1 06/16/2013 1946    BMET    Component Value Date/Time   NA 143 06/27/2021 1213   NA 145 (H) 06/05/2021 1438   NA 143 06/16/2013 1946   K 4.6 06/27/2021 1213   K 4.0 06/16/2013 1946   CL 109 06/27/2021 1213   CL 103 06/16/2013 1946   CO2 27 06/27/2021 1213   CO2 31 06/16/2013 1946   GLUCOSE 94 06/27/2021 1213   GLUCOSE 93 06/16/2013 1946   BUN 17 06/27/2021 1213   BUN 14 06/05/2021 1438   BUN 13 06/16/2013 1946   CREATININE 0.77 06/27/2021 1213   CREATININE 0.63 06/16/2013 1946   CALCIUM 9.5 06/27/2021 1213   CALCIUM 9.0 06/16/2013 1946   GFRNONAA >60 06/27/2021 1213   GFRNONAA >60 06/16/2013 1946   GFRAA 106 12/08/2019 1020   GFRAA >60 06/16/2013 1946    BNP    Component Value Date/Time   BNP 197.8 (H) 06/27/2021 1213    ProBNP No results found for: "PROBNP"  Specialty Problems       Pulmonary Problems   Centrilobular emphysema ([place])  Sleep apnea    Allergies  Allergen Reactions   Morphine And Related Nausea And Vomiting   Celexa [Citalopram Hydrobromide] Other (See Comments)    Pruritis    Effexor [Venlafaxine] Other (See Comments)    Panic attack   Lipitor [Atorvastatin] Other (See Comments)    Myalgias   Shellfish Allergy Hives and Swelling   Wellbutrin [Bupropion] Anxiety    Immunization History  Administered Date(s) Administered   Fluad Quad(high Dose 65+) 11/17/2018   Influenza,inj,Quad PF,6+ Mos 10/09/2015, 12/26/2016   Influenza-Unspecified 11/22/2017   Pneumococcal Conjugate-13 03/03/2019   Pneumococcal Polysaccharide-23 03/28/2009   Td 06/14/2004   Tdap 05/28/2015    Past Medical History:  Diagnosis Date   Anxiety    Aortic stenosis    CHF (congestive heart failure) (HCC)    COPD (chronic obstructive pulmonary disease) (HCC)    Depression    Fibromyalgia    GERD (gastroesophageal reflux disease)    Hyperlipidemia    Hypertension    Hypertensive heart disease    Myalgia     Obesity    Sciatica    Seborrheic keratosis    Stress incontinence     Tobacco History: Social History   Tobacco Use  Smoking Status Former   Packs/day: 1.00   Years: 40.00   Total pack years: 40.00   Types: Cigarettes   Quit date: 11/01/2016   Years since quitting: 4.7  Smokeless Tobacco Never  Tobacco Comments   Is trying cold-turkey with social support   Counseling given: Not Answered Tobacco comments: Is trying cold-turkey with social support   Continue to not smoke  Outpatient Encounter Medications as of 07/15/2021  Medication Sig   albuterol (VENTOLIN HFA) 108 (90 Base) MCG/ACT inhaler INHALE 2 PUFFS INTO THE LUNGS EVERY 6 HOURS AS NEEDED FOR WHEEZING OR SHORTNESS OF BREATH   ALPRAZolam (XANAX) 0.5 MG tablet Take 1 tablet (0.5 mg total) by mouth 2 (two) times daily as needed. for anxiety   baclofen (LIORESAL) 10 MG tablet TAKE 1 TABLET(10 MG) BY MOUTH THREE TIMES DAILY   budesonide-formoterol (SYMBICORT) 160-4.5 MCG/ACT inhaler Inhale 2 puffs into the lungs 2 (two) times daily.   Calcium Carbonate-Vit D-Min (CALCIUM 1200 PO) Take by mouth daily.   cetirizine (ZYRTEC) 10 MG tablet Take 1 tablet (10 mg total) by mouth daily.   clopidogrel (PLAVIX) 75 MG tablet TAKE 1 TABLET(75 MG) BY MOUTH DAILY   esomeprazole (NEXIUM) 40 MG capsule Take 1 tab two times daily.   furosemide (LASIX) 40 MG tablet Take 1 tablet (40 mg total) by mouth daily as needed.   HYDROcodone-acetaminophen (NORCO) 5-325 MG tablet Take 1 tablet by mouth every 6 (six) hours as needed for moderate pain.   hydrOXYzine (ATARAX) 25 MG tablet TAKE 1 TABLET(25 MG) BY MOUTH THREE TIMES DAILY AS NEEDED   lisinopril (ZESTRIL) 2.5 MG tablet TAKE 1 TABLET(2.5 MG) BY MOUTH DAILY   Magnesium 400 MG CAPS Take by mouth. Take 6 tablets daily   meloxicam (MOBIC) 15 MG tablet TAKE 1 TABLET(15 MG) BY MOUTH DAILY   Potassium Gluconate 595 MG CAPS Take by mouth. Take 4 capsules daily   rosuvastatin (CRESTOR) 20 MG tablet  Take 1 tablet (20 mg total) by mouth daily.   triamcinolone cream (KENALOG) 0.1 % Apply 1 application. topically 2 (two) times daily.   [DISCONTINUED] methylPREDNISolone (MEDROL DOSEPAK) 4 MG TBPK tablet Take as directed   No facility-administered encounter medications on file as of 07/15/2021.     Review of Systems  Review of Systems  No chest pain with exertion.  No orthopnea or PND.  Comprehensive review of systems otherwise negative. Physical Exam  BP 128/64 (BP Location: Right Arm, Patient Position: Sitting, Cuff Size: Normal)   Pulse (!) 54   Ht '5\' 5"'$  (1.651 m)   Wt 178 lb 9.6 oz (81 kg)   LMP  (LMP Unknown)   SpO2 92%   BMI 29.72 kg/m   Wt Readings from Last 5 Encounters:  07/15/21 178 lb 9.6 oz (81 kg)  07/12/21 179 lb (81.2 kg)  06/27/21 176 lb (79.8 kg)  06/17/21 179 lb 3.2 oz (81.3 kg)  06/05/21 176 lb 9.6 oz (80.1 kg)    BMI Readings from Last 5 Encounters:  07/15/21 29.72 kg/m  07/12/21 29.24 kg/m  06/27/21 28.84 kg/m  06/17/21 29.82 kg/m  06/05/21 29.39 kg/m     Physical Exam General: Well-appearing, no acute distress Eyes: EOMI, no icterus Neck:, Supple, no JVP Pulmonary: Clear, distant, normal work of breathing Cardiovascular: Regular rate and rhythm, no murmur Abdomen: Nondistended, bowel sounds present MSK: No synovitis, no joint effusion Neuro: Normal gait, no weakness Psych: Normal mood, full affect   Assessment & Plan:   Possible pulmonary hypertension: Estimated elevated PA/RV pressures 03/2021.  Signs of pulmonary hypertension or estimated PA/RV pressures dating back to 2015.  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  In addition, concern for hypoxemia given her report of desaturation.  She indeed did desaturate today, new oxygen ordered.  Concern for group 3 disease due to hypoxemic respiratory failure.  Possible echocardiogram can improve with adequate oxygen therapy.  Given  lack of worsening symptoms over the last several years and findings, further supports group 2 disease if pulmonary hypertension is present.  Recommend repeat TTE already scheduled via her cardiologist.  Chronic hypoxemic respiratory failure: Reportedly drops in the past.  Ambulated today in clinic and dropped in the 70s on room air was able to recover with oxygen supplementation.  Oxygen prescribed.  Return in about 4 weeks (around 08/12/2021).   Lanier Clam, MD 07/22/2021

## 2021-07-23 ENCOUNTER — Telehealth: Payer: Self-pay | Admitting: Cardiology

## 2021-07-25 ENCOUNTER — Other Ambulatory Visit: Payer: Self-pay

## 2021-07-25 DIAGNOSIS — Z1211 Encounter for screening for malignant neoplasm of colon: Secondary | ICD-10-CM

## 2021-07-25 MED ORDER — NA SULFATE-K SULFATE-MG SULF 17.5-3.13-1.6 GM/177ML PO SOLN: 354.0000 mL | Freq: Once | ORAL | 0 refills | Status: AC

## 2021-08-01 ENCOUNTER — Telehealth: Payer: Self-pay

## 2021-08-01 NOTE — Telephone Encounter (Signed)
Patient called to cancel procedure will call back to reschedule called endo to cancel

## 2021-08-13 ENCOUNTER — Ambulatory Visit (INDEPENDENT_AMBULATORY_CARE_PROVIDER_SITE_OTHER): Payer: Medicare Other

## 2021-08-13 DIAGNOSIS — I2510 Atherosclerotic heart disease of native coronary artery without angina pectoris: Secondary | ICD-10-CM | POA: Diagnosis not present

## 2021-08-13 LAB — ECHOCARDIOGRAM COMPLETE
AR max vel: 2.77 cm2
AV Area VTI: 2.45 cm2
AV Area mean vel: 2.55 cm2
AV Mean grad: 7 mmHg
AV Peak grad: 13.1 mmHg
Ao pk vel: 1.81 m/s
Area-P 1/2: 6.96 cm2
Calc EF: 58.8 %
S' Lateral: 2.9 cm
Single Plane A2C EF: 56 %
Single Plane A4C EF: 61.4 %

## 2021-08-14 ENCOUNTER — Ambulatory Visit: Admit: 2021-08-14 | Payer: Medicare Other | Admitting: Gastroenterology

## 2021-08-14 SURGERY — COLONOSCOPY WITH PROPOFOL
Anesthesia: General

## 2021-08-21 ENCOUNTER — Other Ambulatory Visit: Payer: Self-pay | Admitting: Nurse Practitioner

## 2021-08-21 ENCOUNTER — Encounter: Payer: Self-pay | Admitting: Pulmonary Disease

## 2021-08-21 ENCOUNTER — Ambulatory Visit (INDEPENDENT_AMBULATORY_CARE_PROVIDER_SITE_OTHER): Payer: Medicare Other | Admitting: Pulmonary Disease

## 2021-08-21 VITALS — BP 116/62 | HR 43 | Temp 98.5°F | Ht 65.0 in | Wt 180.6 lb

## 2021-08-21 DIAGNOSIS — G4733 Obstructive sleep apnea (adult) (pediatric): Secondary | ICD-10-CM | POA: Diagnosis not present

## 2021-08-21 NOTE — Progress Notes (Signed)
$'@Patient'k$  ID: Brandi Steele, female    DOB: [birth year], 66 y.o.   MRN: [id number]  Chief Complaint  Patient presents with   Follow-up    Pt is here for follow up for osa. Pt is here to go over results of echocardiogram.     Referring provider: Jon Billings, NP  HPI:   66 y.o. woman whom are seen in follow up  for evaluation of pulmonary  hypertension.    Patient returns for planned follow-up.  He may have his last visit repeat TTE obtained through the [facility] system demonstrates normal LV systolic function, grade 2 diastolic dysfunction, dilated left atrium, dilated right atrium, mildly reduced RV function with enlarged RV size and estimated elevated RVSP.  Compared to most recent echocardiogram 03/2021 in the [place] system demonstrates similarly mildly enlarged RV with newly reduced RV function, similar LA dilation, only grade 1 diastolic dysfunction at that time.  At last visit she was prescribed oxygen therapy given signs of hypoxemia with walking.  She reports good adherence to this.  However she is taking care of her grandchild, most of the day 5 days a week.  Difficult for her to do this due to dyspnea as well as managing oxygen cords, tubing etc.  We discussed at length the natural progression of pulmonary hypertension.  The treatment strategies to help relieve stress on the heart and RV.  The implications oxygen therapy and potential therapy for pulmonary hypertension will have on her day-to-day life.  HPI at initial visit Patient has chronic dyspnea.  Largely unchanged.  Over the last several years.  Been followed by cardiologist in the [place] system.  Followed with coronary artery disease.  Serial echocardiograms over the years.  Most recently she was called and informed via the phone that there is pulmonary hypertension.  No further discussion.  Normal routine follow-up scheduled.  She was not pleased with this.  This prompted self-referral here for evaluation of pulmonary  hypertension.  Revealed serial echocardiograms had been in 2015.  In 2015 she had enlarged LA, enlarged RA, enlarged RV with normal RV function.  Similar-appearing echo in 2018.  Most recent echo 03/2021 reviewed which shows similar findings with reported elevated estimated RVSP.  Estimated RVSP not really commented on in the past.  She reports desaturations in the past.  Intermittent.  High 80s.  She denies any significant desaturations otherwise.  Most recent chest x-ray 6/1 personally reviewed and interpreted as clear.  CTA PE protocol same day personally reviewed with mild emphysematous changes, stable 2 mm right lower lobe nodule on my interpretation.  PMH: Anxiety, GERD, CAD, hyperlipidemia Surgical history: Hysterectomy Family history: Father with CAD, with CAD, CVA Social history: Former smoker, 40-pack-year, reportedly quit 2019, lives in [place] / Pulmonary Flowsheets:   ACT:      No data to display           MMRC:     No data to display           Epworth:      No data to display           Tests:   FENO:  No results found for: "NITRICOXIDE"  PFT:     No data to display           WALK:      No data to display           Imaging: Personally reviewed and as per EMR and discussion in  this note ECHOCARDIOGRAM COMPLETE  Result Date: 08/13/2021    ECHOCARDIOGRAM REPORT   Patient Name:   Brandi Steele [facility] Date of Exam: 08/13/2021 Medical Rec #:  [id number]       Height:       65.0 in Accession #:    [id number]      Weight:       180.0 lb Date of Birth:  1955-09-20       BSA:          1.892 m Patient Age:    77 years        BP:           120/70 mmHg Patient Gender: F               HR:           77 bpm. Exam Location:  [place] Procedure: 2D Echo, Cardiac Doppler, Color Doppler and Strain Analysis Indications:    I50.20* Unspecified systolic (congestive) heart failure  History:        Patient has no prior history of Echocardiogram  examinations.                 CHF, CAD, COPD; Risk Factors:Hypertension, Dyslipidemia, Sleep                 Apnea and Former Smoker.  Sonographer:    Pilar Jarvis RDMS, RVT, RDCS Referring Phys: [id number] BRIAN AGBOR-ETANG IMPRESSIONS  1. Left ventricular ejection fraction, by estimation, is 60 to 65%. The left ventricle has normal function. The left ventricle has no regional wall motion abnormalities. Left ventricular diastolic parameters are consistent with Grade II diastolic dysfunction (pseudonormalization). The average left ventricular global longitudinal strain is -22.0 %.  2. Right ventricular systolic function is mildly reduced. The right ventricular size is moderately enlarged. There is severely elevated pulmonary artery systolic pressure. The estimated right ventricular systolic pressure is 27.0 mmHg.  3. Left atrial size was mildly dilated.  4. Right atrial size was mildly dilated.  5. The mitral valve is normal in structure. No evidence of mitral valve regurgitation. No evidence of mitral stenosis.  6. The aortic valve is normal in structure. Aortic valve regurgitation is not visualized. Aortic valve sclerosis is present, with no evidence of aortic valve stenosis.  7. The inferior vena cava is dilated in size with >50% respiratory variability, suggesting right atrial pressure of 8 mmHg. FINDINGS  Left Ventricle: Left ventricular ejection fraction, by estimation, is 60 to 65%. The left ventricle has normal function. The left ventricle has no regional wall motion abnormalities. The average left ventricular global longitudinal strain is -22.0 %. The left ventricular internal cavity size was normal in size. There is no left ventricular hypertrophy. Left ventricular diastolic parameters are consistent with Grade II diastolic dysfunction (pseudonormalization). Right Ventricle: The right ventricular size is moderately enlarged. No increase in right ventricular wall thickness. Right ventricular systolic function  is mildly reduced. There is severely elevated pulmonary artery systolic pressure. The tricuspid regurgitant velocity is 3.95 m/s, and with an assumed right atrial pressure of 10 mmHg, the estimated right ventricular systolic pressure is 35.0 mmHg. Left Atrium: Left atrial size was mildly dilated. Right Atrium: Right atrial size was mildly dilated. Pericardium: There is no evidence of pericardial effusion. Mitral Valve: The mitral valve is normal in structure. No evidence of mitral valve regurgitation. No evidence of mitral valve stenosis. Tricuspid Valve: The tricuspid valve is normal in structure. Tricuspid valve regurgitation is mild . No evidence  of tricuspid stenosis. Aortic Valve: The aortic valve is normal in structure. Aortic valve regurgitation is not visualized. Aortic valve sclerosis is present, with no evidence of aortic valve stenosis. Aortic valve mean gradient measures 7.0 mmHg. Aortic valve peak gradient measures 13.1 mmHg. Aortic valve area, by VTI measures 2.45 cm. Pulmonic Valve: The pulmonic valve was normal in structure. Pulmonic valve regurgitation is not visualized. No evidence of pulmonic stenosis. Aorta: The aortic root is normal in size and structure. Venous: The inferior vena cava is dilated in size with greater than 50% respiratory variability, suggesting right atrial pressure of 8 mmHg. IAS/Shunts: No atrial level shunt detected by color flow Doppler.  LEFT VENTRICLE PLAX 2D LVIDd:         4.80 cm     Diastology LVIDs:         2.90 cm     LV e' medial:    7.29 cm/s LV PW:         0.80 cm     LV E/e' medial:  15.2 LV IVS:        0.80 cm     LV e' lateral:   7.72 cm/s LVOT diam:     1.90 cm     LV E/e' lateral: 14.4 LV SV:         96 LV SV Index:   51          2D Longitudinal Strain LVOT Area:     2.84 cm    2D Strain GLS Avg:     -22.0 %  LV Volumes (MOD) LV vol d, MOD A2C: 90.3 ml LV vol d, MOD A4C: 95.7 ml LV vol s, MOD A2C: 39.7 ml LV vol s, MOD A4C: 36.9 ml LV SV MOD A2C:     50.6 ml  LV SV MOD A4C:     95.7 ml LV SV MOD BP:      55.1 ml RIGHT VENTRICLE             IVC RV Basal diam:  4.10 cm     IVC diam: 1.90 cm RV Mid diam:    3.90 cm RV S prime:     16.20 cm/s RVOT diam:      4.30 cm TAPSE (M-mode): 3.1 cm LEFT ATRIUM             Index        RIGHT ATRIUM           Index LA diam:        4.40 cm 2.33 cm/m   RA Area:     16.80 cm LA Vol (A2C):   43.8 ml 23.16 ml/m  RA Volume:   44.30 ml  23.42 ml/m LA Vol (A4C):   44.0 ml 23.26 ml/m LA Biplane Vol: 44.6 ml 23.58 ml/m  AORTIC VALVE                     PULMONIC VALVE AV Area (Vmax):    2.77 cm      PV Vmax:       2.56 m/s AV Area (Vmean):   2.55 cm      PV Peak grad:  26.3 mmHg AV Area (VTI):     2.45 cm AV Vmax:           181.00 cm/s AV Vmean:          121.000 cm/s AV VTI:            0.394 m  AV Peak Grad:      13.1 mmHg AV Mean Grad:      7.0 mmHg LVOT Vmax:         177.00 cm/s LVOT Vmean:        109.000 cm/s LVOT VTI:          0.340 m LVOT/AV VTI ratio: 0.86  AORTA Ao Root diam: 3.10 cm Ao Asc diam:  3.10 cm Ao Arch diam: 2.6 cm MITRAL VALVE                TRICUSPID VALVE MV Area (PHT): 6.96 cm     TR Peak grad:   62.4 mmHg MV Decel Time: 109 msec     TR Vmax:        395.00 cm/s MV E velocity: 111.00 cm/s MV A velocity: 106.00 cm/s  SHUNTS MV E/A ratio:  1.05         Systemic VTI:  0.34 m                             Systemic Diam: 1.90 cm                             Pulmonic Diam: 4.30 cm Ida Rogue MD Electronically signed by Ida Rogue MD Signature Date/Time: 08/13/2021/6:19:47 PM    Final     Lab Results: Personally reviewed CBC    Component Value Date/Time   WBC 6.9 06/27/2021 1213   RBC 5.00 06/27/2021 1213   HGB 14.5 06/27/2021 1213   HGB 13.9 12/05/2020 0855   HCT 45.6 06/27/2021 1213   HCT 43.1 12/05/2020 0855   PLT 139 (L) 06/27/2021 1213   PLT 164 12/05/2020 0855   MCV 91.2 06/27/2021 1213   MCV 91 12/05/2020 0855   MCV 93 06/16/2013 1946   MCH 29.0 06/27/2021 1213   MCHC 31.8 06/27/2021 1213   RDW  14.3 06/27/2021 1213   RDW 13.5 12/05/2020 0855   RDW 15.0 (H) 06/16/2013 1946   LYMPHSABS 1.2 06/27/2021 1213   LYMPHSABS 1.4 12/05/2020 0855   LYMPHSABS 2.4 06/16/2013 1946   MONOABS 0.5 06/27/2021 1213   MONOABS 0.6 06/16/2013 1946   EOSABS 0.1 06/27/2021 1213   EOSABS 0.1 12/05/2020 0855   EOSABS 0.1 06/16/2013 1946   BASOSABS 0.1 06/27/2021 1213   BASOSABS 0.1 12/05/2020 0855   BASOSABS 0.1 06/16/2013 1946    BMET    Component Value Date/Time   NA 143 06/27/2021 1213   NA 145 (H) 06/05/2021 1438   NA 143 06/16/2013 1946   K 4.6 06/27/2021 1213   K 4.0 06/16/2013 1946   CL 109 06/27/2021 1213   CL 103 06/16/2013 1946   CO2 27 06/27/2021 1213   CO2 31 06/16/2013 1946   GLUCOSE 94 06/27/2021 1213   GLUCOSE 93 06/16/2013 1946   BUN 17 06/27/2021 1213   BUN 14 06/05/2021 1438   BUN 13 06/16/2013 1946   CREATININE 0.77 06/27/2021 1213   CREATININE 0.63 06/16/2013 1946   CALCIUM 9.5 06/27/2021 1213   CALCIUM 9.0 06/16/2013 1946   GFRNONAA >60 06/27/2021 1213   GFRNONAA >60 06/16/2013 1946   GFRAA 106 12/08/2019 1020   GFRAA >60 06/16/2013 1946    BNP    Component Value Date/Time   BNP 197.8 (H) 06/27/2021 1213    ProBNP No results found for: "PROBNP"  Specialty Problems  Pulmonary Problems   Centrilobular emphysema (HCC)   Sleep apnea    Allergies  Allergen Reactions   Morphine And Related Nausea And Vomiting   Celexa [Citalopram Hydrobromide] Other (See Comments)    Pruritis    Effexor [Venlafaxine] Other (See Comments)    Panic attack   Lipitor [Atorvastatin] Other (See Comments)    Myalgias   Shellfish Allergy Hives and Swelling   Wellbutrin [Bupropion] Anxiety    Immunization History  Administered Date(s) Administered   Fluad Quad(high Dose 65+) 11/17/2018   Influenza,inj,Quad PF,6+ Mos 10/09/2015, 12/26/2016   Influenza-Unspecified 11/22/2017   Pneumococcal Conjugate-13 03/03/2019   Pneumococcal Polysaccharide-23 03/28/2009   Td  06/14/2004   Tdap 05/28/2015    Past Medical History:  Diagnosis Date   Anxiety    Aortic stenosis    CHF (congestive heart failure) (HCC)    COPD (chronic obstructive pulmonary disease) (HCC)    Depression    Fibromyalgia    GERD (gastroesophageal reflux disease)    Hyperlipidemia    Hypertension    Hypertensive heart disease    Myalgia    Obesity    Sciatica    Seborrheic keratosis    Stress incontinence     Tobacco History: Social History   Tobacco Use  Smoking Status Former   Packs/day: 1.00   Years: 40.00   Total pack years: 40.00   Types: Cigarettes   Quit date: 11/01/2016   Years since quitting: 4.8  Smokeless Tobacco Never  Tobacco Comments   Is trying cold-turkey with social support   Counseling given: Not Answered Tobacco comments: Is trying cold-turkey with social support   Continue to not smoke  Outpatient Encounter Medications as of 08/21/2021  Medication Sig   albuterol (VENTOLIN HFA) 108 (90 Base) MCG/ACT inhaler INHALE 2 PUFFS INTO THE LUNGS EVERY 6 HOURS AS NEEDED FOR WHEEZING OR SHORTNESS OF BREATH   ALPRAZolam (XANAX) 0.5 MG tablet Take 1 tablet (0.5 mg total) by mouth 2 (two) times daily as needed. for anxiety   baclofen (LIORESAL) 10 MG tablet TAKE 1 TABLET(10 MG) BY MOUTH THREE TIMES DAILY   budesonide-formoterol (SYMBICORT) 160-4.5 MCG/ACT inhaler Inhale 2 puffs into the lungs 2 (two) times daily.   Calcium Carbonate-Vit D-Min (CALCIUM 1200 PO) Take by mouth daily.   cetirizine (ZYRTEC) 10 MG tablet Take 1 tablet (10 mg total) by mouth daily.   clopidogrel (PLAVIX) 75 MG tablet TAKE 1 TABLET(75 MG) BY MOUTH DAILY   esomeprazole (NEXIUM) 40 MG capsule Take 1 tab two times daily.   furosemide (LASIX) 40 MG tablet Take 1 tablet (40 mg total) by mouth daily as needed.   HYDROcodone-acetaminophen (NORCO) 5-325 MG tablet Take 1 tablet by mouth every 6 (six) hours as needed for moderate pain.   hydrOXYzine (ATARAX) 25 MG tablet TAKE 1 TABLET(25  MG) BY MOUTH THREE TIMES DAILY AS NEEDED   lisinopril (ZESTRIL) 2.5 MG tablet TAKE 1 TABLET(2.5 MG) BY MOUTH DAILY   Magnesium 400 MG CAPS Take by mouth. Take 6 tablets daily   meloxicam (MOBIC) 15 MG tablet TAKE 1 TABLET(15 MG) BY MOUTH DAILY   Potassium Gluconate 595 MG CAPS Take by mouth. Take 4 capsules daily   rosuvastatin (CRESTOR) 20 MG tablet Take 1 tablet (20 mg total) by mouth daily.   triamcinolone cream (KENALOG) 0.1 % Apply 1 application. topically 2 (two) times daily.   No facility-administered encounter medications on file as of 08/21/2021.     Review of Systems  Review of Systems  N/a Physical Exam  BP 116/62 (BP Location: Left Arm, Patient Position: Sitting, Cuff Size: Normal)   Pulse (!) 43   Temp 98.5 F (36.9 C) (Oral)   Ht '5\' 5"'$  (1.651 m)   Wt 180 lb 9.6 oz (81.9 kg)   LMP  (LMP Unknown)   SpO2 98%   BMI 30.05 kg/m   Wt Readings from Last 5 Encounters:  08/21/21 180 lb 9.6 oz (81.9 kg)  07/22/21 180 lb (81.6 kg)  07/15/21 178 lb 9.6 oz (81 kg)  07/12/21 179 lb (81.2 kg)  06/27/21 176 lb (79.8 kg)    BMI Readings from Last 5 Encounters:  08/21/21 30.05 kg/m  07/22/21 29.95 kg/m  07/15/21 29.72 kg/m  07/12/21 29.24 kg/m  06/27/21 28.84 kg/m     Physical Exam General: Well-appearing, no acute distress Eyes: EOMI, no icterus Neck:, Supple, no JVP Pulmonary: Clear, distant, normal work of breathing Cardiovascular: Regular rate and rhythm, no murmur Abdomen: Nondistended, bowel sounds present MSK: No synovitis, no joint effusion Neuro: Normal gait, no weakness Psych: Normal mood, full affect   Assessment & Plan:   Possible pulmonary hypertension: Estimated elevated PA/RV pressures 03/2021 with similar findings on repeat echocardiogram 07/2021.  Signs of pulmonary hypertension or estimated PA/RV pressures dating back to 2015.  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  In addition,  concern for group 3 disease due to hypoxemic respiratory failure with newly discovered exertional hypoxemia at last office visit.  We discussed at length pulmonary vasodilator therapy would require right heart catheterization but also discussed at length.  She is not eager to pursue this at this time.  We did discuss that given discovery hypoxemia, she did have some mild improvement in echocardiogram to oxygen therapy.  She is found it hard to adhere to oxygen therapy with her day-to-day life.  Stressed the importance of maintaining oxygen saturation greater than 90% at all times including when she is out shopping, around town.  Unfortunately, this means she will likely need to give up caring for her grandchild.  Which is very difficult for her emotionally.  Ultimately, recommend right heart cath and continue pulmonary vasodilators acknowledgment treatment may be difficult given her group 2 disease.  However at this time she declines.  Repeat heart ultrasound in about 3 months with good adherence to oxygen therapy and reassess RV function and size.  Chronic hypoxemic respiratory failure with exertion: Stressed importance of maintaining oxygen saturation at 90% at all times.  Stressed importance of using oxygen at all times when moving around even if minimal exertion.  Working on progress of getting a larger portable oxygen concentrator that can deliver continuous flow for her CPAP machine as well as a portable oxygen concentrator she can carry in a backpack with exertion.  Suspect 1 of these will need to be paid out-of-pocket.  OSA on CPAP: With 3 L bleed in.  New order today.  See above for oxygen discussion.  Return in about 6 weeks (around 10/02/2021).   Lanier Clam, MD 08/21/2021  I spent 42 minutes in the care of the patient including face-to-face visit, coordination of care, review of records.

## 2021-08-21 NOTE — Patient Instructions (Addendum)
Nice to see you again  Your recent echocardiogram looks similar to the one done in the spring  I think the pulmonary hypertension related to 2 different things, a stiff heart with gradual fluid buildup as well as low oxygen potentially related to emphysema and then a combination of emphysema and the pulmonary hypertension.  You would fall into the category of group 2 and group 3 pulmonary hypertension.  I think one of the best treatments for the pulmonary hypertension is for your oxygen saturation to stay up above 90% at all times.  This means you need to wear the oxygen at all times when you are exerting yourself.  This means you need to wear 3 L of oxygen when you are doing housework, when you are walking around town, shopping, etc.  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  I do not think you can wear oxygen and move around the house, adequately care for a small child and all that goes with it without getting entangled in potentially being a danger to yourself with tubing and potentially the oxygen falling off or being knocked off.  We will see each other again in about 6 weeks.  I think we should repeat a heart ultrasound when you have been using oxygen consistently when moving around in the house and outside of the house, anytime you are exerting yourself for at least 3 months.  Based on the results of that heart ultrasound or echocardiogram, we can discuss next steps.  We will look into getting one of the portable oxygen concentrator for you.  It may be that you need to pay out-of-pocket for either the small and you can carry around the bag or the larger one that you can we will that could potentially be used when you travel with your CPAP machine.  Return to clinic in 6 weeks or sooner as needed with Dr. Silas Flood

## 2021-08-22 ENCOUNTER — Telehealth: Payer: Self-pay | Admitting: Pulmonary Disease

## 2021-08-22 NOTE — Telephone Encounter (Signed)
Requested medication (s) are due for refill today: yes  Requested medication (s) are on the active medication list: yes  Last refill:  12/25/20  Future visit scheduled:no  Notes to clinic:  Unable to refill per protocol, cannot delegate.      Requested Prescriptions  Pending Prescriptions Disp Refills   ALPRAZolam (XANAX) 0.5 MG tablet [Pharmacy Med Name: ALPRAZOLAM 0.'5MG'$  TABLETS] 60 tablet     Sig: TAKE 1 TABLET(0.5 MG) BY MOUTH TWICE DAILY AS NEEDED FOR ANXIETY     Not Delegated - Psychiatry: Anxiolytics/Hypnotics 2 Failed - 08/21/2021  9:27 AM      Failed - This refill cannot be delegated      Failed - Urine Drug Screen completed in last 360 days      Passed - Patient is not pregnant      Passed - Valid encounter within last 6 months    Recent Outpatient Visits           2 months ago Acute low back pain, unspecified back pain laterality, unspecified whether sciatica present   [place], NP   2 months ago Essential hypertension   [place], Santiago Glad, NP   4 months ago Right lower quadrant abdominal pain   [place], NP   4 months ago Right lower quadrant abdominal pain   [facility] Vigg, Avanti, MD   5 months ago Screening for colon cancer   [facility] Jon Billings, NP       Future Appointments             In 3 weeks Agbor-Etang, Aaron Edelman, MD [facility]   In 1 month Ralene Bathe, MD [place]   In 1 month Hunsucker, Bonna Gains, MD [facility]   In 2 months  [facility], [facility]

## 2021-08-22 NOTE — Telephone Encounter (Signed)
Called and spoke to patient and got issues cleared up with [employer]. Nothing further needed

## 2021-08-22 NOTE — Telephone Encounter (Signed)
ATC patient back to give her the number for [employer] since that is who she wants to go with in regards to her POC.   Unable to leave voicemail.   [employer] number is [phone]  When she calls back

## 2021-08-26 ENCOUNTER — Telehealth: Payer: Self-pay | Admitting: Pulmonary Disease

## 2021-08-26 ENCOUNTER — Encounter: Payer: Self-pay | Admitting: Nurse Practitioner

## 2021-08-26 ENCOUNTER — Ambulatory Visit (INDEPENDENT_AMBULATORY_CARE_PROVIDER_SITE_OTHER): Payer: Medicare Other | Admitting: Nurse Practitioner

## 2021-08-26 VITALS — BP 131/64 | HR 54 | Temp 98.4°F | Wt 181.5 lb

## 2021-08-26 DIAGNOSIS — E782 Mixed hyperlipidemia: Secondary | ICD-10-CM | POA: Diagnosis not present

## 2021-08-26 DIAGNOSIS — D229 Melanocytic nevi, unspecified: Secondary | ICD-10-CM | POA: Diagnosis not present

## 2021-08-26 NOTE — Assessment & Plan Note (Signed)
Has had elevated Triglycerdies.  Would like to see a nutritionist to help with meal planning.  Referral placed at patient's request during visit today.

## 2021-08-26 NOTE — Progress Notes (Signed)
BP 131/64   Pulse (!) 54   Temp 98.4 F (36.9 C) (Oral)   Wt 181 lb 8 oz (82.3 kg)   LMP  (LMP Unknown)   SpO2 94%   BMI 30.20 kg/m    Subjective:    Patient ID: Brandi Steele, female    DOB: November 24, 1955, 66 y.o.   MRN: [id number]  HPI: Brandi Steele is a 66 y.o. female  Chief Complaint  Patient presents with   Follow-up    Patient states she would like a referral back to [facility]. Pt states she would like a referral to dietician/nutritionist to help her triglycerides.     Patient states she would like a referral to [facility].  It has been over a year since she has been there so she needs a new referral.   Patient states she would like to see a dietician to help with her triglycerides.  Patient states she has had elevated triglycerides and would like some help.   Relevant past medical, surgical, family and social history reviewed and updated as indicated. Interim medical history since our last visit reviewed. Allergies and medications reviewed and updated.  Review of Systems  Skin:        mole    Per HPI unless specifically indicated above     Objective:    BP 131/64   Pulse (!) 54   Temp 98.4 F (36.9 C) (Oral)   Wt 181 lb 8 oz (82.3 kg)   LMP  (LMP Unknown)   SpO2 94%   BMI 30.20 kg/m   Wt Readings from Last 3 Encounters:  08/26/21 181 lb 8 oz (82.3 kg)  08/21/21 180 lb 9.6 oz (81.9 kg)  07/22/21 180 lb (81.6 kg)    Physical Exam Vitals and nursing note reviewed.  Constitutional:      General: She is not in acute distress.    Appearance: Normal appearance. She is normal weight. She is not ill-appearing, toxic-appearing or diaphoretic.  HENT:     Head: Normocephalic.     Right Ear: External ear normal.     Left Ear: External ear normal.     Nose: Nose normal.     Mouth/Throat:     Mouth: Mucous membranes are moist.     Pharynx: Oropharynx is clear.  Eyes:     General:        Right eye: No discharge.        Left eye: No  discharge.     Extraocular Movements: Extraocular movements intact.     Conjunctiva/sclera: Conjunctivae normal.     Pupils: Pupils are equal, round, and reactive to light.  Cardiovascular:     Rate and Rhythm: Normal rate and regular rhythm.     Heart sounds: No murmur heard. Pulmonary:     Effort: Pulmonary effort is normal. No respiratory distress.     Breath sounds: Normal breath sounds. No wheezing or rales.  Musculoskeletal:     Cervical back: Normal range of motion and neck supple.  Skin:    General: Skin is warm and dry.     Capillary Refill: Capillary refill takes less than 2 seconds.  Neurological:     General: No focal deficit present.     Mental Status: She is alert and oriented to person, place, and time. Mental status is at baseline.  Psychiatric:        Mood and Affect: Mood normal.        Behavior: Behavior normal.  Thought Content: Thought content normal.        Judgment: Judgment normal.     Results for orders placed or performed in visit on 08/13/21  ECHOCARDIOGRAM COMPLETE  Result Value Ref Range   AR max vel 2.77 cm2   AV Peak grad 13.1 mmHg   Ao pk vel 1.81 m/s   S' Lateral 2.90 cm   Area-P 1/2 6.96 cm2   AV Area VTI 2.45 cm2   AV Mean grad 7.0 mmHg   Single Plane A4C EF 61.4 %   Single Plane A2C EF 56.0 %   Calc EF 58.8 %   AV Area mean vel 2.55 cm2      Assessment & Plan:   Problem List Items Addressed This Visit       Other   Hyperlipidemia - Primary    Has had elevated Triglycerdies.  Would like to see a nutritionist to help with meal planning.  Referral placed at patient's request during visit today.       Relevant Orders   Amb Referral to Nutrition and Diabetic Education   Other Visit Diagnoses     Atypical mole       Referral placed for patient at her request to see [facility].    Relevant Orders   Ambulatory referral to Dermatology        Follow up plan: Return in about 3 months (around 11/26/2021) for HTN,  HLD, DM2 FU.

## 2021-08-26 NOTE — Telephone Encounter (Signed)
Left message for [employer] to get some information on a possible continous flow machine. Will call patient back with update when I hear from [employer].

## 2021-08-27 NOTE — Telephone Encounter (Signed)
See phone note dated 08/22/21 Nothing further needed

## 2021-08-29 ENCOUNTER — Other Ambulatory Visit: Payer: Self-pay

## 2021-08-29 DIAGNOSIS — I509 Heart failure, unspecified: Secondary | ICD-10-CM

## 2021-08-29 DIAGNOSIS — G4733 Obstructive sleep apnea (adult) (pediatric): Secondary | ICD-10-CM

## 2021-09-12 ENCOUNTER — Ambulatory Visit: Payer: Medicare Other | Admitting: Cardiology

## 2021-09-12 ENCOUNTER — Encounter: Payer: Medicare Other | Admitting: Dermatology

## 2021-09-14 NOTE — H&P (View-Only) (Signed)
Cardiology Office Note    Date:  09/16/2021   ID:  Brandi Steele, Brandi Steele 1955-01-29, MRN [id number]  PCP:  Jon Billings, NP  Cardiologist:  Kate Sable, MD  Electrophysiologist:  None   Chief Complaint: Follow up  History of Present Illness:   Brandi Steele is a 66 y.o. female with history of CAD noted on CT imaging of the chest, pulmonary hypertension, PAD status post abdominal aortic surgery greater than 20 years prior, HTN, HLD, chronic hypoxic respiratory failure on supplemental oxygen, COPD with prior tobacco use for 40+ years, and OSA on CPAP who presents for follow-up of echo.  She was previously followed by Dr. Nehemiah Massed, though transitioned her care to Dr. Garen Lah in 06/2021.  Remote echo in 2015 demonstrated an biatrial enlargement with enlarged RV and normal RV systolic function.  Echo was similar in 2018.  RVSP not commented on.  Echo at [facility] in 03/2021 demonstrated an EF greater than 55% with moderate tricuspid regurgitation and severe pulmonary hypertension with a PASP of 74 mmHg.  Treadmill stress test in 03/2021 showed no ischemic EKG changes.  Upon establishing care with our group in 06/2021, and was without symptoms of angina or decompensation.  She was taken Lasix on an as-needed basis.  Echo on 08/13/2021 showed an EF of 60-65%, no RWMA, Gr2DD, mildly reduced RVSF with a moderately enlarged RV cavity size, PASP 72.4 mmHg, mild biatrial enlargement, and aortic valve sclerosis without evidence of stenosis. She was advised to follow up with pulmonary hypertension specialist.   She followed up with pulmonology in 07/2021 with signs and symptoms of pulmonary hypertension felt to likely be dating back to 2015.  She preferred to defer RHC.  She comes in doing well from a cardiac perspective, without symptoms of angina or decompensation.  She remains on supplemental oxygen via nasal cannula at 2 L with exertion.  She is not using oxygen while at rest.  No significant  lower extremity swelling or orthopnea.  She has not needed any furosemide in quite some time.  She reports adherence to CPAP.  She is interested in moving forward with further evaluation of her pulmonary hypertension.  She does report stress eating.  Otherwise, she does not have any active issues or concerns at this time.   Labs independently reviewed: 06/2021 - HGB 14.5, PLT 139, albumin 4.3, AST/ALT normal 05/2021 - magnesium 1.8, A1c 5.6, TC 168, TG 179, HDL 43, LDL 94 11/2020 - TSH normal  Past Medical History:  Diagnosis Date   Anxiety    Aortic stenosis    CHF (congestive heart failure) (HCC)    COPD (chronic obstructive pulmonary disease) (HCC)    Depression    Fibromyalgia    GERD (gastroesophageal reflux disease)    Hyperlipidemia    Hypertension    Hypertensive heart disease    Myalgia    Obesity    Sciatica    Seborrheic keratosis    Stress incontinence     Past Surgical History:  Procedure Laterality Date   TOTAL ABDOMINAL HYSTERECTOMY     partial    Current Medications: Current Meds  Medication Sig   albuterol (VENTOLIN HFA) 108 (90 Base) MCG/ACT inhaler INHALE 2 PUFFS INTO THE LUNGS EVERY 6 HOURS AS NEEDED FOR WHEEZING OR SHORTNESS OF BREATH   ALPRAZolam (XANAX) 0.5 MG tablet TAKE 1 TABLET(0.5 MG) BY MOUTH TWICE DAILY AS NEEDED FOR ANXIETY   baclofen (LIORESAL) 10 MG tablet TAKE 1 TABLET(10 MG) BY MOUTH THREE TIMES  DAILY (Patient taking differently: as needed.)   budesonide-formoterol (SYMBICORT) 160-4.5 MCG/ACT inhaler Inhale 2 puffs into the lungs 2 (two) times daily.   Calcium Carbonate-Vit D-Min (CALCIUM 1200 PO) Take by mouth daily.   clopidogrel (PLAVIX) 75 MG tablet TAKE 1 TABLET(75 MG) BY MOUTH DAILY   esomeprazole (NEXIUM) 40 MG capsule Take 1 tab two times daily.   furosemide (LASIX) 40 MG tablet Take 1 tablet (40 mg total) by mouth daily as needed.   HYDROcodone-acetaminophen (NORCO) 5-325 MG tablet Take 1 tablet by mouth every 6 (six) hours as  needed for moderate pain.   hydrOXYzine (ATARAX) 25 MG tablet TAKE 1 TABLET(25 MG) BY MOUTH THREE TIMES DAILY AS NEEDED   lisinopril (ZESTRIL) 2.5 MG tablet TAKE 1 TABLET(2.5 MG) BY MOUTH DAILY   Magnesium 400 MG CAPS Take by mouth. Take 6 tablets daily   meloxicam (MOBIC) 15 MG tablet TAKE 1 TABLET(15 MG) BY MOUTH DAILY   Potassium Gluconate 595 MG CAPS Take by mouth. Take 4 capsules daily   rosuvastatin (CRESTOR) 20 MG tablet Take 1 tablet (20 mg total) by mouth daily.   triamcinolone cream (KENALOG) 0.1 % Apply 1 application. topically 2 (two) times daily.    Allergies:   Morphine and related, Celexa [citalopram hydrobromide], Effexor [venlafaxine], Lipitor [atorvastatin], Shellfish allergy, and Wellbutrin [bupropion]   Social History   Socioeconomic History   Marital status: Married    Spouse name: Not on file   Number of children: Not on file   Years of education: Not on file   Highest education level: Not on file  Occupational History   Not on file  Tobacco Use   Smoking status: Former    Packs/day: 1.00    Years: 40.00    Total pack years: 40.00    Types: Cigarettes    Quit date: 11/01/2016    Years since quitting: 4.8   Smokeless tobacco: Never   Tobacco comments:    Is trying cold-turkey with social support  Vaping Use   Vaping Use: Former  Substance and Sexual Activity   Alcohol use: No    Alcohol/week: 0.0 standard drinks of alcohol   Drug use: No   Sexual activity: Not Currently  Other Topics Concern   Not on file  Social History Narrative   Not on file   Social Determinants of Health   Financial Resource Strain: Low Risk  (10/19/2020)   Overall Financial Resource Strain (CARDIA)    Difficulty of Paying Living Expenses: Not hard at all  Food Insecurity: No Food Insecurity (10/19/2020)   Hunger Vital Sign    Worried About Running Out of Food in the Last Year: Never true    [place] in the Last Year: Never true  Transportation Needs: No  Transportation Needs (10/19/2020)   PRAPARE - Hydrologist (Medical): No    Lack of Transportation (Non-Medical): No  Physical Activity: Inactive (10/19/2020)   Exercise Vital Sign    Days of Exercise per Week: 0 days    Minutes of Exercise per Session: 0 min  Stress: Stress Concern Present (10/19/2020)   [place]    Feeling of Stress : Very much  Social Connections: Not on file     Family History:  [name] family history includes Diabetes in her brother; Heart disease in her father, paternal grandfather, and paternal grandmother; Hypertension in her mother; Stroke in her maternal grandfather, maternal grandmother, and  mother. There is no history of Breast cancer.  ROS:   12-point review of systems is negative unless otherwise noted in the HPI.   EKGs/Labs/Other Studies Reviewed:    Studies reviewed were summarized above. The additional studies were reviewed today:  2D echo 12/19/2013 [facility]): INTERPRETATION  NORMAL LEFT VENTRICULAR SYSTOLIC FUNCTION  NORMAL RIGHT VENTRICULAR SYSTOLIC FUNCTION  MILD VALVULAR REGURGITATION (See above)  NO VALVULAR STENOSIS  __________  2D echo 04/07/2016 [facility]): INTERPRETATION  NORMAL LEFT VENTRICULAR SYSTOLIC FUNCTION   WITH MILD LVH  NORMAL RIGHT VENTRICULAR SYSTOLIC FUNCTION  MILD VALVULAR REGURGITATION (See above)  NO VALVULAR STENOSIS  EF 50-55%  __________  2D echo 04/17/2021 [facility]): INTERPRETATION  NORMAL LEFT VENTRICULAR SYSTOLIC FUNCTION  NORMAL RIGHT VENTRICULAR SYSTOLIC FUNCTION  MODERATE VALVULAR REGURGITATION (See above)  NO VALVULAR STENOSIS  SEVERE PULMONARY HYPERTENSION WITH AN RVSP = 74 mmHg (RAP = 3 mmHg)  __________  2D echo 08/13/2021: 1. Left ventricular ejection fraction, by estimation, is 60 to 65%. The  left ventricle has normal function. The left ventricle has no regional  wall motion abnormalities.  Left ventricular diastolic parameters are  consistent with Grade II diastolic  dysfunction (pseudonormalization). The average left ventricular global  longitudinal strain is -22.0 %.   2. Right ventricular systolic function is mildly reduced. The right  ventricular size is moderately enlarged. There is severely elevated  pulmonary artery systolic pressure. The estimated right ventricular  systolic pressure is 85.8 mmHg.   3. Left atrial size was mildly dilated.   4. Right atrial size was mildly dilated.   5. The mitral valve is normal in structure. No evidence of mitral valve  regurgitation. No evidence of mitral stenosis.   6. The aortic valve is normal in structure. Aortic valve regurgitation is  not visualized. Aortic valve sclerosis is present, with no evidence of  aortic valve stenosis.   7. The inferior vena cava is dilated in size with >50% respiratory  variability, suggesting right atrial pressure of 8 mmHg.   EKG:  EKG is not ordered today.    Recent Labs: 12/05/2020: TSH 1.620 06/05/2021: Magnesium 1.8 06/27/2021: ALT 13; B Natriuretic Peptide 197.8 09/16/2021: BUN 21; Creatinine, Ser 0.96; Hemoglobin 13.1; Platelets 154; Potassium 4.5; Sodium 140  Recent Lipid Panel    Component Value Date/Time   CHOL 168 06/05/2021 1438   CHOL 142 01/05/2015 1105   TRIG 179 (H) 06/05/2021 1438   TRIG 138 01/05/2015 1105   HDL 43 06/05/2021 1438   CHOLHDL 3.9 06/05/2021 1438   VLDL 28 01/05/2015 1105   LDLCALC 94 06/05/2021 1438    PHYSICAL EXAM:    VS:  BP 120/60 (BP Location: Left Arm, Patient Position: Sitting, Cuff Size: Normal)   Pulse (!) 55   Ht 5' 5.5" (1.664 m)   Wt 184 lb 6 oz (83.6 kg)   LMP  (LMP Unknown)   SpO2 97%   BMI 30.21 kg/m   BMI: Body mass index is 30.21 kg/m.  Physical Exam Vitals reviewed.  Constitutional:      Appearance: She is well-developed.  HENT:     Head: Normocephalic and atraumatic.  Eyes:     General:        Right eye: No discharge.         Left eye: No discharge.  Neck:     Vascular: No JVD.  Cardiovascular:     Rate and Rhythm: Normal rate and regular rhythm.     Pulses:  Dorsalis pedis pulses are 2+ on the right side and 2+ on the left side.       Posterior tibial pulses are 2+ on the right side and 2+ on the left side.     Heart sounds: Normal heart sounds, S1 normal and S2 normal. Heart sounds not distant. No midsystolic click and no opening snap. No murmur heard.    No friction rub.  Pulmonary:     Effort: Pulmonary effort is normal. No respiratory distress.     Breath sounds: Normal breath sounds. No decreased breath sounds, wheezing or rales.  Chest:     Chest wall: No tenderness.  Abdominal:     General: There is no distension.  Musculoskeletal:     Cervical back: Normal range of motion.     Right lower leg: No edema.     Left lower leg: No edema.  Skin:    General: Skin is warm and dry.     Nails: There is no clubbing.  Neurological:     Mental Status: She is alert and oriented to person, place, and time.  Psychiatric:        Speech: Speech normal.        Behavior: Behavior normal.        Thought Content: Thought content normal.        Judgment: Judgment normal.     Wt Readings from Last 3 Encounters:  09/16/21 184 lb 6 oz (83.6 kg)  08/26/21 181 lb 8 oz (82.3 kg)  08/21/21 180 lb 9.6 oz (81.9 kg)     ASSESSMENT & PLAN:   CAD involving the native coronary arteries without angina: No symptoms concerning for angina.  She remains on clopidogrel, given prior PAD, along with rosuvastatin.  HTN: Blood pressure is well controlled in the office today.  Continue management under the direction of PCP.  HLD: LDL 94 in 05/2021.  She remains on rosuvastatin 20 mg.  Followed by PCP.  Pulmonary hypertension: Stable.  Possibly dates back to as far as 76.  She quit smoking in 2017.  She remains on supplemental oxygen via nasal cannula at 2 L with exertion, along with CPAP with supplemental oxygen  at night.  Schedule RHC with further recommendations, including possible referral to our pulmonary hypertension clinic there after.  Not taking as needed furosemide.   Shared Decision Making/Informed Consent{  The risks [stroke (1 in 1000), death (1 in 1000), kidney failure [usually temporary] (1 in 500), bleeding (1 in 200), allergic reaction [possibly serious] (1 in 200)], benefits (diagnostic support and management of coronary artery disease) and alternatives of a cardiac catheterization were discussed in detail with Brandi Steele and she is willing to proceed.    Disposition: F/u with Dr. Garen Lah or an APP in 6 weeks.   Medication Adjustments/Labs and Tests Ordered: Current medicines are reviewed at length with the patient today.  Concerns regarding medicines are outlined above. Medication changes, Labs and Tests ordered today are summarized above and listed in the Patient Instructions accessible in Encounters.   Signed, Christell Faith, PA-C 09/16/2021 10:06 AM     [facility] [address] [place] [address] [place], [place] [address] [phone]

## 2021-09-14 NOTE — Progress Notes (Unsigned)
Cardiology Office Note    Date:  09/16/2021   ID:  Brandi, Steele 18-Sep-1955, MRN [id number]  PCP:  Brandi Billings, NP  Cardiologist:  Brandi Sable, MD  Electrophysiologist:  None   Chief Complaint: Follow up  History of Present Illness:   Brandi Steele is a 66 y.o. female with history of CAD noted on CT imaging of the chest, pulmonary hypertension, PAD status post abdominal aortic surgery greater than 20 years prior, HTN, HLD, chronic hypoxic respiratory failure on supplemental oxygen, COPD with prior tobacco use for 40+ years, and OSA on CPAP who presents for follow-up of echo.  She was previously followed by Dr. Nehemiah Steele, though transitioned her care to Dr. Garen Steele in 06/2021.  Remote echo in 2015 demonstrated an biatrial enlargement with enlarged RV and normal RV systolic function.  Echo was similar in 2018.  RVSP not commented on.  Echo at [facility] in 03/2021 demonstrated an EF greater than 55% with moderate tricuspid regurgitation and severe pulmonary hypertension with a PASP of 74 mmHg.  Treadmill stress test in 03/2021 showed no ischemic EKG changes.  Upon establishing care with our group in 06/2021, and was without symptoms of angina or decompensation.  She was taken Lasix on an as-needed basis.  Echo on 08/13/2021 showed an EF of 60-65%, no RWMA, Gr2DD, mildly reduced RVSF with a moderately enlarged RV cavity size, PASP 72.4 mmHg, mild biatrial enlargement, and aortic valve sclerosis without evidence of stenosis. She was advised to follow up with pulmonary hypertension specialist.   She followed up with pulmonology in 07/2021 with signs and symptoms of pulmonary hypertension felt to likely be dating back to 2015.  She preferred to defer RHC.  She comes in doing well from a cardiac perspective, without symptoms of angina or decompensation.  She remains on supplemental oxygen via nasal cannula at 2 L with exertion.  She is not using oxygen while at rest.  No significant  lower extremity swelling or orthopnea.  She has not needed any furosemide in quite some time.  She reports adherence to CPAP.  She is interested in moving forward with further evaluation of her pulmonary hypertension.  She does report stress eating.  Otherwise, she does not have any active issues or concerns at this time.   Labs independently reviewed: 06/2021 - HGB 14.5, PLT 139, albumin 4.3, AST/ALT normal 05/2021 - magnesium 1.8, A1c 5.6, TC 168, TG 179, HDL 43, LDL 94 11/2020 - TSH normal  Past Medical History:  Diagnosis Date   Anxiety    Aortic stenosis    CHF (congestive heart failure) (HCC)    COPD (chronic obstructive pulmonary disease) (HCC)    Depression    Fibromyalgia    GERD (gastroesophageal reflux disease)    Hyperlipidemia    Hypertension    Hypertensive heart disease    Myalgia    Obesity    Sciatica    Seborrheic keratosis    Stress incontinence     Past Surgical History:  Procedure Laterality Date   TOTAL ABDOMINAL HYSTERECTOMY     partial    Current Medications: Current Meds  Medication Sig   albuterol (VENTOLIN HFA) 108 (90 Base) MCG/ACT inhaler INHALE 2 PUFFS INTO THE LUNGS EVERY 6 HOURS AS NEEDED FOR WHEEZING OR SHORTNESS OF BREATH   ALPRAZolam (XANAX) 0.5 MG tablet TAKE 1 TABLET(0.5 MG) BY MOUTH TWICE DAILY AS NEEDED FOR ANXIETY   baclofen (LIORESAL) 10 MG tablet TAKE 1 TABLET(10 MG) BY MOUTH THREE TIMES  DAILY (Patient taking differently: as needed.)   budesonide-formoterol (SYMBICORT) 160-4.5 MCG/ACT inhaler Inhale 2 puffs into the lungs 2 (two) times daily.   Calcium Carbonate-Vit D-Min (CALCIUM 1200 PO) Take by mouth daily.   clopidogrel (PLAVIX) 75 MG tablet TAKE 1 TABLET(75 MG) BY MOUTH DAILY   esomeprazole (NEXIUM) 40 MG capsule Take 1 tab two times daily.   furosemide (LASIX) 40 MG tablet Take 1 tablet (40 mg total) by mouth daily as needed.   HYDROcodone-acetaminophen (NORCO) 5-325 MG tablet Take 1 tablet by mouth every 6 (six) hours as  needed for moderate pain.   hydrOXYzine (ATARAX) 25 MG tablet TAKE 1 TABLET(25 MG) BY MOUTH THREE TIMES DAILY AS NEEDED   lisinopril (ZESTRIL) 2.5 MG tablet TAKE 1 TABLET(2.5 MG) BY MOUTH DAILY   Magnesium 400 MG CAPS Take by mouth. Take 6 tablets daily   meloxicam (MOBIC) 15 MG tablet TAKE 1 TABLET(15 MG) BY MOUTH DAILY   Potassium Gluconate 595 MG CAPS Take by mouth. Take 4 capsules daily   rosuvastatin (CRESTOR) 20 MG tablet Take 1 tablet (20 mg total) by mouth daily.   triamcinolone cream (KENALOG) 0.1 % Apply 1 application. topically 2 (two) times daily.    Allergies:   Morphine and related, Celexa [citalopram hydrobromide], Effexor [venlafaxine], Lipitor [atorvastatin], Shellfish allergy, and Wellbutrin [bupropion]   Social History   Socioeconomic History   Marital status: Married    Spouse name: Not on file   Number of children: Not on file   Years of education: Not on file   Highest education level: Not on file  Occupational History   Not on file  Tobacco Use   Smoking status: Former    Packs/day: 1.00    Years: 40.00    Total pack years: 40.00    Types: Cigarettes    Quit date: 11/01/2016    Years since quitting: 4.8   Smokeless tobacco: Never   Tobacco comments:    Is trying cold-turkey with social support  Vaping Use   Vaping Use: Former  Substance and Sexual Activity   Alcohol use: No    Alcohol/week: 0.0 standard drinks of alcohol   Drug use: No   Sexual activity: Not Currently  Other Topics Concern   Not on file  Social History Narrative   Not on file   Social Determinants of Health   Financial Resource Strain: Low Risk  (10/19/2020)   Overall Financial Resource Strain (CARDIA)    Difficulty of Paying Living Expenses: Not hard at all  Food Insecurity: No Food Insecurity (10/19/2020)   Hunger Vital Sign    Worried About Running Out of Food in the Last Year: Never true    [place] in the Last Year: Never true  Transportation Needs: No  Transportation Needs (10/19/2020)   PRAPARE - Hydrologist (Medical): No    Lack of Transportation (Non-Medical): No  Physical Activity: Inactive (10/19/2020)   Exercise Vital Sign    Days of Exercise per Week: 0 days    Minutes of Exercise per Session: 0 min  Stress: Stress Concern Present (10/19/2020)   [place]    Feeling of Stress : Very much  Social Connections: Not on file     Family History:  [name] family history includes Diabetes in her brother; Heart disease in her father, paternal grandfather, and paternal grandmother; Hypertension in her mother; Stroke in her maternal grandfather, maternal grandmother, and  mother. There is no history of Breast cancer.  ROS:   12-point review of systems is negative unless otherwise noted in the HPI.   EKGs/Labs/Other Studies Reviewed:    Studies reviewed were summarized above. The additional studies were reviewed today:  2D echo 12/19/2013 Jef[facility]): INTERPRETATION  NORMAL LEFT VENTRICULAR SYSTOLIC FUNCTION  NORMAL RIGHT VENTRICULAR SYSTOLIC FUNCTION  MILD VALVULAR REGURGITATION (See above)  NO VALVULAR STENOSIS  __________  2D echo 04/07/2016 Jef[facility] Bryant): INTERPRETATION  NORMAL LEFT VENTRICULAR SYSTOLIC FUNCTION   WITH MILD LVH  NORMAL RIGHT VENTRICULAR SYSTOLIC FUNCTION  MILD VALVULAR REGURGITATION (See above)  NO VALVULAR STENOSIS  EF 50-55%  __________  2D echo 04/17/2021 Jefm Bryant): INTERPRETATION  NORMAL LEFT VENTRICULAR SYSTOLIC FUNCTION  NORMAL RIGHT VENTRICULAR SYSTOLIC FUNCTION  MODERATE VALVULAR REGURGITATION (See above)  NO VALVULAR STENOSIS  SEVERE PULMONARY HYPERTENSION WITH AN RVSP = 74 mmHg (RAP = 3 mmHg)  __________  2D echo 08/13/2021: 1. Left ventricular ejection fraction, by estimation, is 60 to 65%. The  left ventricle has normal function. The left ventricle has no regional  wall motion abnormalities.  Left ventricular diastolic parameters are  consistent with Grade II diastolic  dysfunction (pseudonormalization). The average left ventricular global  longitudinal strain is -22.0 %.   2. Right ventricular systolic function is mildly reduced. The right  ventricular size is moderately enlarged. There is severely elevated  pulmonary artery systolic pressure. The estimated right ventricular  systolic pressure is 41.7 mmHg.   3. Left atrial size was mildly dilated.   4. Right atrial size was mildly dilated.   5. The mitral valve is normal in structure. No evidence of mitral valve  regurgitation. No evidence of mitral stenosis.   6. The aortic valve is normal in structure. Aortic valve regurgitation is  not visualized. Aortic valve sclerosis is present, with no evidence of  aortic valve stenosis.   7. The inferior vena cava is dilated in size with >50% respiratory  variability, suggesting right atrial pressure of 8 mmHg.   EKG:  EKG is not ordered today.    Recent Labs: 12/05/2020: TSH 1.620 06/05/2021: Magnesium 1.8 06/27/2021: ALT 13; B Natriuretic Peptide 197.8 09/16/2021: BUN 21; Creatinine, Ser 0.96; Hemoglobin 13.1; Platelets 154; Potassium 4.5; Sodium 140  Recent Lipid Panel    Component Value Date/Time   CHOL 168 06/05/2021 1438   CHOL 142 01/05/2015 1105   TRIG 179 (H) 06/05/2021 1438   TRIG 138 01/05/2015 1105   HDL 43 06/05/2021 1438   CHOLHDL 3.9 06/05/2021 1438   VLDL 28 01/05/2015 1105   LDLCALC 94 06/05/2021 1438    PHYSICAL EXAM:    VS:  BP 120/60 (BP Location: Left Arm, Patient Position: Sitting, Cuff Size: Normal)   Pulse (!) 55   Ht 5' 5.5" (1.664 m)   Wt 184 lb 6 oz (83.6 kg)   LMP  (LMP Unknown)   SpO2 97%   BMI 30.21 kg/m   BMI: Body mass index is 30.21 kg/m.  Physical Exam Vitals reviewed.  Constitutional:      Appearance: She is well-developed.  HENT:     Head: Normocephalic and atraumatic.  Eyes:     General:        Right eye: No discharge.         Left eye: No discharge.  Neck:     Vascular: No JVD.  Cardiovascular:     Rate and Rhythm: Normal rate and regular rhythm.     Pulses:  Dorsalis pedis pulses are 2+ on the right side and 2+ on the left side.       Posterior tibial pulses are 2+ on the right side and 2+ on the left side.     Heart sounds: Normal heart sounds, S1 normal and S2 normal. Heart sounds not distant. No midsystolic click and no opening snap. No murmur heard.    No friction rub.  Pulmonary:     Effort: Pulmonary effort is normal. No respiratory distress.     Breath sounds: Normal breath sounds. No decreased breath sounds, wheezing or rales.  Chest:     Chest wall: No tenderness.  Abdominal:     General: There is no distension.  Musculoskeletal:     Cervical back: Normal range of motion.     Right lower leg: No edema.     Left lower leg: No edema.  Skin:    General: Skin is warm and dry.     Nails: There is no clubbing.  Neurological:     Mental Status: She is alert and oriented to person, place, and time.  Psychiatric:        Speech: Speech normal.        Behavior: Behavior normal.        Thought Content: Thought content normal.        Judgment: Judgment normal.     Wt Readings from Last 3 Encounters:  09/16/21 184 lb 6 oz (83.6 kg)  08/26/21 181 lb 8 oz (82.3 kg)  08/21/21 180 lb 9.6 oz (81.9 kg)     ASSESSMENT & PLAN:   CAD involving the native coronary arteries without angina: No symptoms concerning for angina.  She remains on clopidogrel, given prior PAD, along with rosuvastatin.  HTN: Blood pressure is well controlled in the office today.  Continue management under the direction of PCP.  HLD: LDL 94 in 05/2021.  She remains on rosuvastatin 20 mg.  Followed by PCP.  Pulmonary hypertension: Stable.  Possibly dates back to as far as 54.  She quit smoking in 2017.  She remains on supplemental oxygen via nasal cannula at 2 L with exertion, along with CPAP with supplemental oxygen  at night.  Schedule RHC with further recommendations, including possible referral to our pulmonary hypertension clinic there after.  Not taking as needed furosemide.   Shared Decision Making/Informed Consent{  The risks [stroke (1 in 1000), death (1 in 1000), kidney failure [usually temporary] (1 in 500), bleeding (1 in 200), allergic reaction [possibly serious] (1 in 200)], benefits (diagnostic support and management of coronary artery disease) and alternatives of a cardiac catheterization were discussed in detail with Ms. Smyth and she is willing to proceed.    Disposition: F/u with Dr. Garen Steele or an APP in 6 weeks.   Medication Adjustments/Labs and Tests Ordered: Current medicines are reviewed at length with the patient today.  Concerns regarding medicines are outlined above. Medication changes, Labs and Tests ordered today are summarized above and listed in the Patient Instructions accessible in Encounters.   Signed, Christell Faith, PA-C 09/16/2021 10:06 AM     [facility] [address] [place], [place] [address] [phone]

## 2021-09-16 ENCOUNTER — Encounter: Payer: Self-pay | Admitting: Physician Assistant

## 2021-09-16 ENCOUNTER — Other Ambulatory Visit: Payer: Self-pay | Admitting: Physician Assistant

## 2021-09-16 ENCOUNTER — Ambulatory Visit (INDEPENDENT_AMBULATORY_CARE_PROVIDER_SITE_OTHER): Payer: Medicare Other | Admitting: Physician Assistant

## 2021-09-16 ENCOUNTER — Other Ambulatory Visit
Admission: RE | Admit: 2021-09-16 | Discharge: 2021-09-16 | Disposition: A | Discharge status: Home / Self Care | Payer: Medicare Other | Attending: Pediatrics | Admitting: Pediatrics

## 2021-09-16 VITALS — BP 120/60 | HR 55 | Ht 65.5 in | Wt 184.4 lb

## 2021-09-16 DIAGNOSIS — I2510 Atherosclerotic heart disease of native coronary artery without angina pectoris: Secondary | ICD-10-CM | POA: Diagnosis present

## 2021-09-16 DIAGNOSIS — I10 Essential (primary) hypertension: Secondary | ICD-10-CM

## 2021-09-16 DIAGNOSIS — I272 Pulmonary hypertension, unspecified: Secondary | ICD-10-CM

## 2021-09-16 DIAGNOSIS — E785 Hyperlipidemia, unspecified: Secondary | ICD-10-CM | POA: Diagnosis not present

## 2021-09-16 LAB — CBC
HCT: 41.2 % (ref 36.0–46.0)
Hemoglobin: 13.1 g/dL (ref 12.0–15.0)
MCH: 28.4 pg (ref 26.0–34.0)
MCHC: 31.8 g/dL (ref 30.0–36.0)
MCV: 89.2 fL (ref 80.0–100.0)
Platelets: 154 10*3/uL (ref 150–400)
RBC: 4.62 MIL/uL (ref 3.87–5.11)
RDW: 13.8 % (ref 11.5–15.5)
WBC: 6 10*3/uL (ref 4.0–10.5)
nRBC: 0 % (ref 0.0–0.2)

## 2021-09-16 LAB — BASIC METABOLIC PANEL
Anion gap: 7 (ref 5–15)
BUN: 21 mg/dL (ref 8–23)
CO2: 27 mmol/L (ref 22–32)
Calcium: 9.3 mg/dL (ref 8.9–10.3)
Chloride: 106 mmol/L (ref 98–111)
Creatinine, Ser: 0.96 mg/dL (ref 0.44–1.00)
GFR, Estimated: >60 mL/min (ref >60)
Glucose, Bld: 101 mg/dL — ABNORMAL HIGH (ref 70–99)
Potassium: 4.5 mmol/L (ref 3.5–5.1)
Sodium: 140 mmol/L (ref 135–145)

## 2021-09-16 MED ORDER — SODIUM CHLORIDE 0.9% FLUSH: 3.0000 mL | Freq: Two times a day (BID) | INTRAVENOUS | Status: DC

## 2021-09-16 NOTE — Patient Instructions (Signed)
Medication Instructions:  Your physician recommends that you continue on your current medications as directed. Please refer to the Current Medication list given to you today.  *If you need a refill on your cardiac medications before your next appointment, please call your pharmacy*   Lab Work: BMET & CBC today over at the [place] entrance then check in at registration.   If you have labs (blood work) drawn today and your tests are completely normal, you will receive your results only by: [place] (if you have MyChart) OR A paper copy in the mail If you have any lab test that is abnormal or we need to change your treatment, we will call you to review the results.   Testing/Procedures: [facility] Cardiac Cath Instructions  You are scheduled for a Cardiac Cath on: Monday October 07, 2021  Please arrive at 08:30am on the day of your procedure Please expect a call from our [place] to pre-register you Do not eat/drink anything after midnight Someone will need to drive you home It is recommended someone be with you for the first 24 hours after your procedure Wear clothes that are easy to get on/off and wear slip on shoes if possible   Medications bring a current list of all medications with you  _XX__ Do not take your FUROSEMIDE the morning of your procedure.   _XX__ You may take all of your other medications the morning of your procedure with enough water to swallow safely   Day of your procedure: Arrive at the [place] entrance.  Free valet service is available.  After entering the [place] please check-in at the registration desk (1st desk on your right) to receive your armband. After receiving your armband someone will escort you to the cardiac cath/special procedures waiting area.  The usual length of stay after your procedure is about 2 to 3 hours.  This can vary.  If you have any questions, please call our office at [phone], or  you may call the cardiac cath lab at [facility] directly at [phone]   Follow-Up: At [facility], you and your health needs are our priority.  As part of our continuing mission to provide you with exceptional heart care, we have created designated Provider Care Teams.  These Care Teams include your primary Cardiologist (physician) and Advanced Practice Providers (APPs -  Physician Assistants and Nurse Practitioners) who all work together to provide you with the care you need, when you need it.   Your next appointment:   6 week(s)  The format for your next appointment:   In Person  Provider:   Kate Sable, MD or Christell Faith, PA-C      Important Information About Sugar

## 2021-09-16 NOTE — Progress Notes (Signed)
[place] orders signed and held.

## 2021-09-23 ENCOUNTER — Ambulatory Visit: Payer: Medicare Other | Admitting: Dermatology

## 2021-10-06 ENCOUNTER — Other Ambulatory Visit: Payer: Self-pay | Admitting: Nurse Practitioner

## 2021-10-07 ENCOUNTER — Ambulatory Visit
Admission: RE | Admit: 2021-10-07 | Discharge: 2021-10-07 | Disposition: A | Discharge status: Home / Self Care | Payer: Medicare Other | Attending: Cardiovascular Disease | Admitting: Cardiovascular Disease

## 2021-10-07 ENCOUNTER — Encounter: Payer: Self-pay | Admitting: Cardiovascular Disease

## 2021-10-07 ENCOUNTER — Ambulatory Visit: Payer: Medicare Other | Admitting: Pulmonary Disease

## 2021-10-07 ENCOUNTER — Encounter
Admission: RE | Disposition: A | Discharge status: Home / Self Care | Payer: Self-pay | Source: Home / Self Care | Attending: Cardiovascular Disease

## 2021-10-07 ENCOUNTER — Other Ambulatory Visit: Payer: Self-pay

## 2021-10-07 DIAGNOSIS — Z87891 Personal history of nicotine dependence: Secondary | ICD-10-CM | POA: Insufficient documentation

## 2021-10-07 DIAGNOSIS — I272 Pulmonary hypertension, unspecified: Secondary | ICD-10-CM | POA: Insufficient documentation

## 2021-10-07 DIAGNOSIS — I2510 Atherosclerotic heart disease of native coronary artery without angina pectoris: Secondary | ICD-10-CM | POA: Diagnosis not present

## 2021-10-07 DIAGNOSIS — G4733 Obstructive sleep apnea (adult) (pediatric): Secondary | ICD-10-CM | POA: Diagnosis not present

## 2021-10-07 DIAGNOSIS — Z79899 Other long term (current) drug therapy: Secondary | ICD-10-CM | POA: Insufficient documentation

## 2021-10-07 DIAGNOSIS — I739 Peripheral vascular disease, unspecified: Secondary | ICD-10-CM | POA: Diagnosis not present

## 2021-10-07 DIAGNOSIS — I10 Essential (primary) hypertension: Secondary | ICD-10-CM | POA: Insufficient documentation

## 2021-10-07 DIAGNOSIS — Z7902 Long term (current) use of antithrombotics/antiplatelets: Secondary | ICD-10-CM | POA: Diagnosis not present

## 2021-10-07 DIAGNOSIS — J449 Chronic obstructive pulmonary disease, unspecified: Secondary | ICD-10-CM | POA: Insufficient documentation

## 2021-10-07 DIAGNOSIS — E785 Hyperlipidemia, unspecified: Secondary | ICD-10-CM | POA: Insufficient documentation

## 2021-10-07 HISTORY — PX: RIGHT HEART CATH: CATH118263

## 2021-10-07 SURGERY — RIGHT HEART CATH
Anesthesia: Moderate Sedation

## 2021-10-07 MED ORDER — LIDOCAINE HCL 1 % IJ SOLN
INTRAMUSCULAR | Status: AC
  Filled 2021-10-07: qty 20

## 2021-10-07 MED ORDER — SODIUM CHLORIDE 0.9% FLUSH: 3.0000 mL | Freq: Two times a day (BID) | INTRAVENOUS | Status: DC

## 2021-10-07 MED ORDER — ASPIRIN 81 MG PO CHEW: 81.0000 mg | CHEWABLE_TABLET | ORAL | Status: DC

## 2021-10-07 MED ORDER — SODIUM CHLORIDE 0.9% FLUSH: 3.0000 mL | INTRAVENOUS | Status: DC | PRN

## 2021-10-07 MED ORDER — HEPARIN (PORCINE) IN NACL 1000-0.9 UT/500ML-% IV SOLN
INTRAVENOUS | Status: DC | PRN
  Administered 2021-10-07: 500 mL

## 2021-10-07 MED ORDER — SODIUM CHLORIDE 0.9 % IV SOLN: INTRAVENOUS | Status: DC

## 2021-10-07 MED ORDER — MIDAZOLAM HCL 2 MG/2ML IJ SOLN
INTRAMUSCULAR | Status: AC
  Filled 2021-10-07: qty 2

## 2021-10-07 MED ORDER — SODIUM CHLORIDE 0.9 % IV SOLN: 250.0000 mL | INTRAVENOUS | Status: DC | PRN

## 2021-10-07 MED ORDER — MIDAZOLAM HCL 2 MG/2ML IJ SOLN
INTRAMUSCULAR | Status: DC | PRN
  Administered 2021-10-07: 1 mg via INTRAVENOUS

## 2021-10-07 MED ORDER — FENTANYL CITRATE (PF) 100 MCG/2ML IJ SOLN
INTRAMUSCULAR | Status: AC
  Filled 2021-10-07: qty 2

## 2021-10-07 MED ORDER — LIDOCAINE HCL (PF) 1 % IJ SOLN
INTRAMUSCULAR | Status: DC | PRN
  Administered 2021-10-07: 2 mL

## 2021-10-07 MED ORDER — ACETAMINOPHEN 325 MG PO TABS: 650.0000 mg | ORAL_TABLET | ORAL | Status: DC | PRN

## 2021-10-07 SURGICAL SUPPLY — 7 items
CATH BALLN WEDGE 5F 110CM (CATHETERS) IMPLANT
DRAPE BRACHIAL (DRAPES) IMPLANT
GUIDEWIRE .025 260CM (WIRE) IMPLANT
KIT RIGHT HEART ACIST (MISCELLANEOUS) IMPLANT
PACK CARDIAC CATH (CUSTOM PROCEDURE TRAY) ×1 IMPLANT
SHEATH GLIDE SLENDER 4/5FR (SHEATH) IMPLANT
WIRE NITINOL .018 (WIRE) IMPLANT

## 2021-10-07 NOTE — Discharge Instructions (Signed)
Right Heart Cath, Care After This sheet gives you information about how to care for yourself after your procedure. Your health care provider may also give you more specific instructions. If you have problems or questions, contact your health care provider. What can I expect after the procedure? After the procedure, it is common to have: Bruising or mild discomfort in the area where the IV was inserted (insertion site). Follow these instructions at home: Eating and drinking  You may eat and drink after your procedure.  Drink a lot of fluids for the first several days after the procedure, as directed by your health care provider. This helps to wash (flush) the contrast out of your body. Examples of healthy fluids include water or low-calorie drinks. General instructions Check your IV insertion area and also your venous access site every day for signs of infection. Check for: Redness, swelling, or pain. Fluid or blood. Warmth. Pus or a bad smell. Take over-the-counter and prescription medicines only as told by your health care provider. Rest and return to your normal activities as told by your health care provider. Ask your health care provider what activities are safe for you. Do not drive for 24 hours if you were given a medicine to help you relax (sedative), or until your health care provider approves. Keep all follow-up visits as told by your health care provider. This is important. Contact a health care provider if: Your skin becomes itchy or you develop a rash or hives. You have a fever that does not get better with medicine. You feel nauseous. You vomit. You have redness, swelling, or pain around the insertion site. You have fluid or blood coming from the insertion site. Your insertion area feels warm to the touch. You have pus or a bad smell coming from the insertion site. Get help right away if: You have difficulty breathing or shortness of breath. You develop chest pain. You  faint. You feel very dizzy. These symptoms may represent a serious problem that is an emergency. Do not wait to see if the symptoms will go away. Get medical help right away. Call your local emergency services (911 in the [place] Do not drive yourself to the hospital. Summary After your procedure, it is common to have bruising or mild discomfort in the area where the IV was inserted. You should check your IV insertion area every day for signs of infection. Take over-the-counter and prescription medicines only as told by your health care provider. You should drink a lot of fluids for the first several days after the procedure to help flush the contrast from your body. This information is not intended to replace advice given to you by your health care provider. Make sure you discuss any questions you have with your health care provider. Document Released: 11/03/2012 Document Revised: 12/26/2016 Document Reviewed: 12/08/2015 Elsevier Patient Education  2020 Elsevier Inc. 

## 2021-10-07 NOTE — Interval H&P Note (Signed)
History and Physical Interval Note:  10/07/2021 9:51 AM  Brandi Steele Brandi Steele  has presented today for surgery, with the diagnosis of RT Heart Cath   Pulmonary hypertension.  The various methods of treatment have been discussed with the patient and family. After consideration of risks, benefits and other options for treatment, the patient has consented to  Procedure(s): RIGHT HEART CATH (N/A) as a surgical intervention.  [name] history has been reviewed, patient examined, no change in status, stable for surgery.  I have reviewed the patient's chart and labs.  Questions were answered to the patient's satisfaction.     Kathlyn [place]

## 2021-10-08 NOTE — Telephone Encounter (Signed)
Requested Prescriptions  Pending Prescriptions Disp Refills  . lisinopril (ZESTRIL) 2.5 MG tablet [Pharmacy Med Name: LISINOPRIL 2.'5MG'$  TABLETS] 90 tablet 1    Sig: TAKE 1 TABLET(2.5 MG) BY MOUTH DAILY     Cardiovascular:  ACE Inhibitors Passed - 10/06/2021  2:03 PM      Passed - Cr in normal range and within 180 days    Creatinine  Date Value Ref Range Status  06/16/2013 0.63 0.60 - 1.30 mg/dL Final   Creatinine, Ser  Date Value Ref Range Status  09/16/2021 0.96 0.44 - 1.00 mg/dL Final         Passed - K in normal range and within 180 days    Potassium  Date Value Ref Range Status  09/16/2021 4.5 3.5 - 5.1 mmol/L Final  06/16/2013 4.0 3.5 - 5.1 mmol/L Final         Passed - Patient is not pregnant      Passed - Last BP in normal range    BP Readings from Last 1 Encounters:  10/07/21 (!) 143/66         Passed - Valid encounter within last 6 months    Recent Outpatient Visits          1 month ago Mixed hyperlipidemia   [place], NP   3 months ago Acute low back pain, unspecified back pain laterality, unspecified whether sciatica present   [place], NP   4 months ago Essential hypertension   [place], [place], NP   5 months ago Right lower quadrant abdominal pain   [place], NP   6 months ago Right lower quadrant abdominal pain   [facility] Vigg, Avanti, MD      Future Appointments            In 1 week  [place], [place]   In 2 weeks Dunn, Areta Haber, PA-C [place]. [place]   In 1 month Jon Billings, NP [facility], [place]

## 2021-10-10 ENCOUNTER — Other Ambulatory Visit: Payer: Self-pay | Admitting: Nurse Practitioner

## 2021-10-10 NOTE — Telephone Encounter (Signed)
Medication Refill - Medication: furosemide (LASIX) 40 MG tablet  Has the patient contacted their pharmacy? NO Pt had a cath on Monday, and the dr that did the cath told her she needs to keep this medication on hand.  Pt states she thinks she may have a little puffiness around her rib cage, but they told her this might happen after cath. Pt states she threw out the old Rx that had 2021 on it, cause probably not any good anyway. She has not taken this med since then.  But she wants just in case she needs since that dr told her to.  Preferred Pharmacy (with phone number or street name): [facility] [place], [place] Has the patient been seen for an appointment in the last year OR does the patient have an upcoming appointment? Yes.

## 2021-10-11 MED ORDER — FUROSEMIDE 40 MG PO TABS: 40.0000 mg | ORAL_TABLET | Freq: Every day | ORAL | 3 refills | Status: DC | PRN

## 2021-10-11 NOTE — Telephone Encounter (Signed)
Requested medications are due for refill today.  yes  Requested medications are on the active medications list.  yes  Last refill. 09/07/2019 #30 3rf  Future visit scheduled.   yes  Notes to clinic.  Per med list pt is no longer taking. Per pt note, pt needs to keep this medication available.    Requested Prescriptions  Pending Prescriptions Disp Refills   furosemide (LASIX) 40 MG tablet 30 tablet 3    Sig: Take 1 tablet (40 mg total) by mouth daily as needed.     Cardiovascular:  Diuretics - Loop Failed - 10/10/2021 11:32 AM      Failed - Last BP in normal range    BP Readings from Last 1 Encounters:  10/07/21 (!) 143/66         Passed - K in normal range and within 180 days    Potassium  Date Value Ref Range Status  09/16/2021 4.5 3.5 - 5.1 mmol/L Final  06/16/2013 4.0 3.5 - 5.1 mmol/L Final         Passed - Ca in normal range and within 180 days    Calcium  Date Value Ref Range Status  09/16/2021 9.3 8.9 - 10.3 mg/dL Final   Calcium, Total  Date Value Ref Range Status  06/16/2013 9.0 8.5 - 10.1 mg/dL Final         Passed - Na in normal range and within 180 days    Sodium  Date Value Ref Range Status  09/16/2021 140 135 - 145 mmol/L Final  06/05/2021 145 (H) 134 - 144 mmol/L Final  06/16/2013 143 136 - 145 mmol/L Final         Passed - Cr in normal range and within 180 days    Creatinine  Date Value Ref Range Status  06/16/2013 0.63 0.60 - 1.30 mg/dL Final   Creatinine, Ser  Date Value Ref Range Status  09/16/2021 0.96 0.44 - 1.00 mg/dL Final         Passed - Cl in normal range and within 180 days    Chloride  Date Value Ref Range Status  09/16/2021 106 98 - 111 mmol/L Final  06/16/2013 103 98 - 107 mmol/L Final         Passed - Mg Level in normal range and within 180 days    Magnesium  Date Value Ref Range Status  06/05/2021 1.8 1.6 - 2.3 mg/dL Final  06/16/2013 1.4 (L) mg/dL Final    Comment:    1.8-2.4 THERAPEUTIC RANGE: 4-7 mg/dL TOXIC: >  10 mg/dL  -----------------------          Passed - Valid encounter within last 6 months    Recent Outpatient Visits           1 month ago Mixed hyperlipidemia   [place], NP   3 months ago Acute low back pain, unspecified back pain laterality, unspecified whether sciatica present   [place], NP   4 months ago Essential hypertension   [facility] Jon Billings, NP   5 months ago Right lower quadrant abdominal pain   [place], NP   6 months ago Right lower quadrant abdominal pain   [facility] Vigg, Avanti, MD       Future Appointments             In 1 week  [facility], [place]   In 2 weeks Rise Mu, PA-C  [place]   In 1 month Jon Billings, NP [facility], [place]

## 2021-10-21 ENCOUNTER — Ambulatory Visit (INDEPENDENT_AMBULATORY_CARE_PROVIDER_SITE_OTHER): Payer: Medicare Other | Admitting: *Deleted

## 2021-10-21 DIAGNOSIS — Z0000 Encounter for general adult medical examination without abnormal findings: Secondary | ICD-10-CM

## 2021-10-21 NOTE — Progress Notes (Signed)
Subjective:   Brandi Steele is a 66 y.o. female who presents for Medicare Annual (Subsequent) preventive examination.  I connected with  [name] on 10/21/21 by a telephone enabled telemedicine application and verified that I am speaking with the correct person using two identifiers.   I discussed the limitations of evaluation and management by telemedicine. The patient expressed understanding and agreed to proceed.  Patient location: home  Provider location: in office    Review of Systems     Cardiac Risk Factors include: advanced age (>53mn, >>83women);hypertension;family history of premature cardiovascular disease;obesity (BMI >30kg/m2);sedentary lifestyle     Objective:    Today's Vitals   There is no height or weight on file to calculate BMI.     10/21/2021    8:35 AM 06/27/2021   12:05 PM 10/19/2020    8:33 AM 10/17/2019    9:09 AM 05/22/2015   12:08 PM 01/07/2015    8:20 PM  Advanced Directives  Does Patient Have a Medical Advance Directive? No No No No No No  Would patient like information on creating a medical advance directive? No - Patient declined No - Patient declined   No - patient declined information No - patient declined information    Current Medications (verified) Outpatient Encounter Medications as of 10/21/2021  Medication Sig   clopidogrel (PLAVIX) 75 MG tablet TAKE 1 TABLET(75 MG) BY MOUTH DAILY   esomeprazole (NEXIUM) 40 MG capsule Take 1 tab two times daily.   furosemide (LASIX) 40 MG tablet Take 1 tablet (40 mg total) by mouth daily as needed.   hydrOXYzine (ATARAX) 25 MG tablet TAKE 1 TABLET(25 MG) BY MOUTH THREE TIMES DAILY AS NEEDED   lisinopril (ZESTRIL) 2.5 MG tablet TAKE 1 TABLET(2.5 MG) BY MOUTH DAILY   Magnesium 400 MG CAPS Take by mouth. Take 6 tablets daily   meloxicam (MOBIC) 15 MG tablet TAKE 1 TABLET(15 MG) BY MOUTH DAILY   Potassium Gluconate 595 MG CAPS Take by mouth. Take 4 capsules daily   rosuvastatin (CRESTOR) 20 MG  tablet Take 1 tablet (20 mg total) by mouth daily.   triamcinolone cream (KENALOG) 0.1 % Apply 1 application. topically 2 (two) times daily.   albuterol (VENTOLIN HFA) 108 (90 Base) MCG/ACT inhaler INHALE 2 PUFFS INTO THE LUNGS EVERY 6 HOURS AS NEEDED FOR WHEEZING OR SHORTNESS OF BREATH (Patient not taking: Reported on 10/07/2021)   baclofen (LIORESAL) 10 MG tablet TAKE 1 TABLET(10 MG) BY MOUTH THREE TIMES DAILY (Patient not taking: Reported on 10/21/2021)   budesonide-formoterol (SYMBICORT) 160-4.5 MCG/ACT inhaler Inhale 2 puffs into the lungs 2 (two) times daily.   Calcium Carbonate-Vit D-Min (CALCIUM 1200 PO) Take by mouth daily.   cetirizine (ZYRTEC) 10 MG tablet Take 1 tablet (10 mg total) by mouth daily. (Patient not taking: Reported on 09/16/2021)   Facility-Administered Encounter Medications as of 10/21/2021  Medication   sodium chloride flush (NS) 0.9 % injection 3 mL    Allergies (verified) Morphine and related, Celexa [citalopram hydrobromide], Effexor [venlafaxine], Lipitor [atorvastatin], Shellfish allergy, and Wellbutrin [bupropion]   History: Past Medical History:  Diagnosis Date   Anxiety    Aortic stenosis    CHF (congestive heart failure) (HCC)    COPD (chronic obstructive pulmonary disease) (HCC)    Depression    Fibromyalgia    GERD (gastroesophageal reflux disease)    Hyperlipidemia    Hypertension    Hypertensive heart disease    Myalgia    Obesity  Sciatica    Seborrheic keratosis    Stress incontinence    Past Surgical History:  Procedure Laterality Date   RIGHT HEART CATH N/A 10/07/2021   Procedure: RIGHT HEART CATH;  Surgeon: Wellington Hampshire, MD;  Location: [facility];  Service: Cardiovascular;  Laterality: N/A;   TONSILLECTOMY     TOTAL ABDOMINAL HYSTERECTOMY     partial   Family History  Problem Relation Age of Onset   Stroke Mother    Hypertension Mother    Heart disease Father    Diabetes Brother    Stroke Maternal Grandmother     Stroke Maternal Grandfather    Heart disease Paternal Grandmother    Heart disease Paternal Grandfather    Breast cancer Neg Hx    Social History   Socioeconomic History   Marital status: Married    Spouse name: Not on file   Number of children: Not on file   Years of education: Not on file   Highest education level: Not on file  Occupational History   Not on file  Tobacco Use   Smoking status: Former    Packs/day: 1.00    Years: 40.00    Total pack years: 40.00    Types: Cigarettes    Quit date: 11/01/2016    Years since quitting: 4.9   Smokeless tobacco: Never   Tobacco comments:    Is trying cold-turkey with social support  Vaping Use   Vaping Use: Former   Start date: 12/02/2016   Quit date: 09/06/2021  Substance and Sexual Activity   Alcohol use: No    Alcohol/week: 0.0 standard drinks of alcohol   Drug use: No   Sexual activity: Not Currently  Other Topics Concern   Not on file  Social History Narrative   Not on file   Social Determinants of Health   Financial Resource Strain: Low Risk  (10/21/2021)   Overall Financial Resource Strain (CARDIA)    Difficulty of Paying Living Expenses: Not hard at all  Food Insecurity: No Food Insecurity (10/21/2021)   Hunger Vital Sign    Worried About Running Out of Food in the Last Year: Never true    Ran Out of Food in the Last Year: Never true  Transportation Needs: No Transportation Needs (10/21/2021)   PRAPARE - Hydrologist (Medical): No    Lack of Transportation (Non-Medical): No  Physical Activity: Insufficiently Active (10/21/2021)   Exercise Vital Sign    Days of Exercise per Week: 3 days    Minutes of Exercise per Session: 30 min  Stress: Stress Concern Present (10/21/2021)   [place]    Feeling of Stress : To some extent  Social Connections: Moderately Integrated (10/21/2021)   Social Connection and Isolation Panel  [NHANES]    Frequency of Communication with Friends and Family: More than three times a week    Frequency of Social Gatherings with Friends and Family: Three times a week    Attends Religious Services: More than 4 times per year    Active Member of Clubs or Organizations: No    Attends Archivist Meetings: Never    Marital Status: Married    Tobacco Counseling Counseling given: Not Answered Tobacco comments: Is trying cold-turkey with social support   Clinical Intake:  Pre-visit preparation completed: No  Pain : No/denies pain     Diabetes: No  How often do you need to have  someone help you when you read instructions, pamphlets, or other written materials from your doctor or pharmacy?: 1 - Never  Diabetic?  no  Interpreter Needed?: No  Information entered by :: Leroy Kennedy LPN   Activities of Daily Living    10/21/2021   10:03 AM 10/07/2021    8:41 AM  In your present state of health, do you have any difficulty performing the following activities:  Hearing? 0 0  Vision?  0  Difficulty concentrating or making decisions? 0 0  Walking or climbing stairs? 1   Dressing or bathing? 0 0  Doing errands, shopping? 0   Preparing Food and eating ? N   Using the Toilet? N   In the past six months, have you accidently leaked urine? Y   Do you have problems with loss of bowel control? N   Managing your Medications? N   Managing your Finances? N   Housekeeping or managing your Housekeeping? N     Patient Care Team: Jon Billings, NP as PCP - General Kate Sable, MD as PCP - Cardiology (Cardiology) Corey Skains, MD as Consulting Physician (Cardiology)  Indicate any recent Medical Services you may have received from other than [facility] providers in the past year (date may be approximate).     Assessment:   This is a routine wellness examination for Brandi Steele.  Hearing/Vision screen No results found.  Dietary issues and exercise activities  discussed: Current Exercise Habits: The patient does not participate in regular exercise at present   Goals Addressed             This Visit's Progress    Patient Stated   Not on track    10/17/2019, wants to weigh 160 pounds     Patient Stated   Not on track    10/19/2020, wants to lose 25 pounds     Patient Stated       Wants to stay under 3 % Oxygen so she can continue to travel       Depression Screen    10/21/2021    8:48 AM 10/21/2021    8:46 AM 08/26/2021    9:21 AM 06/17/2021    3:02 PM 06/05/2021    2:22 PM 06/05/2021    2:13 PM 04/01/2021    9:47 AM  PHQ 2/9 Scores  PHQ - 2 Score 0 0 0 0 0 0 0  PHQ- 9 Score '1 1 2 '$ 0 0 0 0    Fall Risk    10/21/2021    8:35 AM 08/26/2021    9:21 AM 06/17/2021    3:02 PM 06/05/2021    2:22 PM 06/05/2021    2:13 PM  Fall Risk   Falls in the past year? 0 0 0 0 0  Number falls in past yr: 0 0 0 0 0  Injury with Fall? 0 0 0 0 0  Risk for fall due to :  No Fall Risks No Fall Risks No Fall Risks No Fall Risks  Follow up Falls evaluation completed;Education provided;Falls prevention discussed Falls evaluation completed Falls evaluation completed Falls evaluation completed Falls evaluation completed    FALL RISK PREVENTION PERTAINING TO THE HOME:  Any stairs in or around the home? Yes  If so, are there any without handrails? No  Home free of loose throw rugs in walkways, pet beds, electrical cords, etc? Yes  Adequate lighting in your home to reduce risk of falls? Yes   ASSISTIVE DEVICES UTILIZED TO PREVENT FALLS:  Life alert? No  Use of a cane, walker or w/c? No  Grab bars in the bathroom? Yes  Shower chair or bench in shower? Yes  Elevated toilet seat or a handicapped toilet? Yes   TIMED UP AND GO:  Was the test performed? No .    Cognitive Function:        10/21/2021    8:36 AM 10/17/2019    9:13 AM  6CIT Screen  What Year? 0 points 0 points  What month? 0 points 0 points  What time? 0 points 0 points  Count back  from 20 0 points 0 points  Months in reverse 0 points 0 points  Repeat phrase 0 points 0 points  Total Score 0 points 0 points    Immunizations Immunization History  Administered Date(s) Administered   Fluad Quad(high Dose 65+) 11/17/2018   Influenza,inj,Quad PF,6+ Mos 10/09/2015, 12/26/2016   Influenza-Unspecified 11/22/2017   Pneumococcal Conjugate-13 03/03/2019   Pneumococcal Polysaccharide-23 03/28/2009   Td 06/14/2004   Tdap 05/28/2015    TDAP status: Up to date  Flu Vaccine status: Due, Education has been provided regarding the importance of this vaccine. Advised may receive this vaccine at local pharmacy or Health Dept. Aware to provide a copy of the vaccination record if obtained from local pharmacy or Health Dept. Verbalized acceptance and understanding.  Pneumococcal vaccine status: Up to date  Covid-19 vaccine status: Information provided on how to obtain vaccines.   Qualifies for Shingles Vaccine? Yes   Zostavax completed No   Shingrix Completed?: No.    Education has been provided regarding the importance of this vaccine. Patient has been advised to call insurance company to determine out of pocket expense if they have not yet received this vaccine. Advised may also receive vaccine at local pharmacy or Health Dept. Verbalized acceptance and understanding.  Screening Tests Health Maintenance  Topic Date Due   COLONOSCOPY (Pts 45-36yr Insurance coverage will need to be confirmed)  Never done   COVID-19 Vaccine (1) 11/06/2021 (Originally 08/25/1960)   Pneumonia Vaccine 68 Years old (373- PPSV23 or PCV20) 12/05/2021 (Originally 03/02/2020)   Zoster Vaccines- Shingrix (1 of 2) 01/20/2022 (Originally 08/24/1974)   INFLUENZA VACCINE  04/27/2022 (Originally 08/27/2021)   MAMMOGRAM  11/02/2022   TETANUS/TDAP  05/27/2025   DEXA SCAN  Completed   Hepatitis C Screening  Completed   HPV VACCINES  Aged Out    Health Maintenance  Health Maintenance Due  Topic Date Due    COLONOSCOPY (Pts 45-451yrInsurance coverage will need to be confirmed)  Never done    Colonoscopy  patient will call to reschedule  Mammogram status: Ordered  . Pt provided with contact info and advised to call to schedule appt.   Bone Density completed 2022  Lung Cancer Screening: (Low Dose CT Chest recommended if Age 68-80ears, 30 pack-year currently smoking OR have quit w/in 15years.) does qualify.   Lung Cancer Screening Referral: will discuss with MD  Additional Screening:  Hepatitis C Screening: does not qualify; Completed 2017  Vision Screening: Recommended annual ophthalmology exams for early detection of glaucoma and other disorders of the eye. Is the patient up to date with their annual eye exam?  No  Who is the provider or what is the name of the office in which the patient attends annual eye exams?  If pt is not established with a provider, would they like to be referred to a provider to establish care? No .   Dental Screening: Recommended  annual dental exams for proper oral hygiene  Community Resource Referral / Chronic Care Management: CRR required this visit?  No   CCM required this visit?  No      Plan:     I have personally reviewed and noted the following in the patient's chart:   Medical and social history Use of alcohol, tobacco or illicit drugs  Current medications and supplements including opioid prescriptions. Patient is not currently taking opioid prescriptions. Functional ability and status Nutritional status Physical activity Advanced directives List of other physicians Hospitalizations, surgeries, and ER visits in previous 12 months Vitals Screenings to include cognitive, depression, and falls Referrals and appointments  In addition, I have reviewed and discussed with patient certain preventive protocols, quality metrics, and best practice recommendations. A written personalized care plan for preventive services as well as general  preventive health recommendations were provided to patient.     Leroy Kennedy, LPN   0/27/7412   Nurse Notes:

## 2021-10-28 ENCOUNTER — Telehealth: Payer: Self-pay | Admitting: Cardiology

## 2021-10-28 ENCOUNTER — Ambulatory Visit: Payer: Medicare Other | Admitting: Physician Assistant

## 2021-10-28 NOTE — Telephone Encounter (Signed)
Called patient to r/s appointment today due to Brandi Faith, PA being out, patient would like to discuss cath resutls. Please call.

## 2021-10-28 NOTE — Telephone Encounter (Signed)
Spoke with patient and she wanted to know about her procedure. Advised that she would need to review that with provider. Confirmed appointment for next Friday here in our office and she was very appreciative with no further questions at this time.

## 2021-11-08 ENCOUNTER — Encounter: Payer: Self-pay | Admitting: Cardiology

## 2021-11-08 ENCOUNTER — Ambulatory Visit: Payer: Medicare Other | Attending: Physician Assistant | Admitting: Cardiology

## 2021-11-08 VITALS — BP 114/66 | HR 61 | Ht 65.0 in | Wt 186.4 lb

## 2021-11-08 DIAGNOSIS — E785 Hyperlipidemia, unspecified: Secondary | ICD-10-CM | POA: Diagnosis not present

## 2021-11-08 DIAGNOSIS — I10 Essential (primary) hypertension: Secondary | ICD-10-CM

## 2021-11-08 DIAGNOSIS — I272 Pulmonary hypertension, unspecified: Secondary | ICD-10-CM | POA: Diagnosis not present

## 2021-11-08 DIAGNOSIS — I2510 Atherosclerotic heart disease of native coronary artery without angina pectoris: Secondary | ICD-10-CM | POA: Diagnosis not present

## 2021-11-08 DIAGNOSIS — I5189 Other ill-defined heart diseases: Secondary | ICD-10-CM | POA: Diagnosis not present

## 2021-11-08 MED ORDER — ROSUVASTATIN CALCIUM 20 MG PO TABS: 20.0000 mg | ORAL_TABLET | Freq: Every day | ORAL | 3 refills | Status: DC

## 2021-11-08 NOTE — Patient Instructions (Signed)
Medication Instructions:   Your physician recommends that you continue on your current medications as directed. Please refer to the Current Medication list given to you today.   *If you need a refill on your cardiac medications before your next appointment, please call your pharmacy*   Lab Work:  Your physician recommends that you return for a FASTING lipid profile:  At your earliest convenience.  - You will need to be fasting. Please do not have anything to eat or drink after midnight the morning you have the lab work. You may only have water or black coffee with no cream or sugar.   - Please go to the [facility]. You will check in at the front desk to the right as you walk into the atrium. Valet Parking is offered if needed. - No appointment needed. You may go any day between 7 am and 6 pm.    Follow-Up: At [facility], you and your health needs are our priority.  As part of our continuing mission to provide you with exceptional heart care, we have created designated Provider Care Teams.  These Care Teams include your primary Cardiologist (physician) and Advanced Practice Providers (APPs -  Physician Assistants and Nurse Practitioners) who all work together to provide you with the care you need, when you need it.  We recommend signing up for the patient portal called "MyChart".  Sign up information is provided on this After Visit Summary.  MyChart is used to connect with patients for Virtual Visits (Telemedicine).  Patients are able to view lab/test results, encounter notes, upcoming appointments, etc.  Non-urgent messages can be sent to your provider as well.   To learn more about what you can do with MyChart, go to [email or web address].    Your next appointment:   6 month(s)  The format for your next appointment:   In Person  Provider:   Kate Sable, MD    Other Instructions   Important Information About Sugar

## 2021-11-08 NOTE — Progress Notes (Signed)
Cardiology Office Note:    Date:  11/08/2021   ID:  Brandi Steele, DOB 1955-08-04, MRN [id number]  PCP:  Jon Billings, NP   [facility] Providers Cardiologist:  Kate Sable, MD     Referring MD: Jon Billings, NP   No chief complaint on file.   History of Present Illness:    Brandi Steele is a 66 y.o. female with a hx of CAD (RCA calcium on chest CT), hypertension, hyperlipidemia, former smoker x40+ years, pulmonary hypertension, COPD, OSA on CPAP, PAD s/p abdominal aortic surgery over 20 years ago who presents for follow-up.  Previously seen by myself to establish care.  Outside echo did show severely elevated pulmonary arterial pressures.  Right heart cath revealed mixed etiology between arterial and venous.  Edema is adequately controlled with Lasix as needed.  Denies chest pain, uses oxygen only with ambulation.  Feels well otherwise, has no concerns at this time.  Prior notes Echo 07/2021 EF 60 to 65%, severe pulmonary hypertension, RVSP 72.4 mmHg Right heart cath 09/2021 wedge pressure 18, PA 56/24, mean 35 mmHg Outside echocardiogram 03/2021 normal EF>55% , moderate TR, severe pulmonary hypertension RVSP 34mHg.  Treadmill stress test 03/2021 showed no significant ischemic EKG changes.  Past Medical History:  Diagnosis Date   Anxiety    Aortic stenosis    CHF (congestive heart failure) (HCC)    COPD (chronic obstructive pulmonary disease) (HCC)    Depression    Fibromyalgia    GERD (gastroesophageal reflux disease)    Hyperlipidemia    Hypertension    Hypertensive heart disease    Myalgia    Obesity    Sciatica    Seborrheic keratosis    Stress incontinence     Past Surgical History:  Procedure Laterality Date   RIGHT HEART CATH N/A 10/07/2021   Procedure: RIGHT HEART CATH;  Surgeon: AWellington Hampshire MD;  Location[facility];  Service: Cardiovascular;  Laterality: N/A;   TONSILLECTOMY     TOTAL ABDOMINAL HYSTERECTOMY      partial    Current Medications: Current Meds  Medication Sig   albuterol (VENTOLIN HFA) 108 (90 Base) MCG/ACT inhaler INHALE 2 PUFFS INTO THE LUNGS EVERY 6 HOURS AS NEEDED FOR WHEEZING OR SHORTNESS OF BREATH   baclofen (LIORESAL) 10 MG tablet TAKE 1 TABLET(10 MG) BY MOUTH THREE TIMES DAILY   budesonide-formoterol (SYMBICORT) 160-4.5 MCG/ACT inhaler Inhale 2 puffs into the lungs 2 (two) times daily.   Calcium Carbonate-Vit D-Min (CALCIUM 1200 PO) Take by mouth daily.   cetirizine (ZYRTEC) 10 MG tablet Take 1 tablet (10 mg total) by mouth daily.   clopidogrel (PLAVIX) 75 MG tablet TAKE 1 TABLET(75 MG) BY MOUTH DAILY   esomeprazole (NEXIUM) 40 MG capsule Take 1 tab two times daily.   furosemide (LASIX) 40 MG tablet Take 1 tablet (40 mg total) by mouth daily as needed.   hydrOXYzine (ATARAX) 25 MG tablet TAKE 1 TABLET(25 MG) BY MOUTH THREE TIMES DAILY AS NEEDED   lisinopril (ZESTRIL) 2.5 MG tablet TAKE 1 TABLET(2.5 MG) BY MOUTH DAILY   Magnesium 400 MG CAPS Take by mouth. Take 6 tablets daily   meloxicam (MOBIC) 15 MG tablet TAKE 1 TABLET(15 MG) BY MOUTH DAILY   Potassium Gluconate 595 MG CAPS Take by mouth. Take 4 capsules daily   triamcinolone cream (KENALOG) 0.1 % Apply 1 application. topically 2 (two) times daily.   [DISCONTINUED] rosuvastatin (CRESTOR) 20 MG tablet Take 1 tablet (20 mg total) by mouth  daily.   Current Facility-Administered Medications for the 11/08/21 encounter (Office Visit) with Kate Sable, MD  Medication   sodium chloride flush (NS) 0.9 % injection 3 mL     Allergies:   Morphine and related, Celexa [citalopram hydrobromide], Effexor [venlafaxine], Lipitor [atorvastatin], Shellfish allergy, and Wellbutrin [bupropion]   Social History   Socioeconomic History   Marital status: Married    Spouse name: Not on file   Number of children: Not on file   Years of education: Not on file   Highest education level: Not on file  Occupational History   Not on file   Tobacco Use   Smoking status: Former    Packs/day: 1.00    Years: 40.00    Total pack years: 40.00    Types: Cigarettes    Quit date: 11/01/2016    Years since quitting: 5.0   Smokeless tobacco: Never   Tobacco comments:    Is trying cold-turkey with social support  Vaping Use   Vaping Use: Former   Start date: 12/02/2016   Quit date: 09/06/2021  Substance and Sexual Activity   Alcohol use: No    Alcohol/week: 0.0 standard drinks of alcohol   Drug use: No   Sexual activity: Not Currently  Other Topics Concern   Not on file  Social History Narrative   Not on file   Social Determinants of Health   Financial Resource Strain: Low Risk  (10/21/2021)   Overall Financial Resource Strain (CARDIA)    Difficulty of Paying Living Expenses: Not hard at all  Food Insecurity: No Food Insecurity (10/21/2021)   Hunger Vital Sign    Worried About Running Out of Food in the Last Year: Never true    Ran Out of Food in the Last Year: Never true  Transportation Needs: No Transportation Needs (10/21/2021)   PRAPARE - Hydrologist (Medical): No    Lack of Transportation (Non-Medical): No  Physical Activity: Insufficiently Active (10/21/2021)   Exercise Vital Sign    Days of Exercise per Week: 3 days    Minutes of Exercise per Session: 30 min  Stress: Stress Concern Present (10/21/2021)   [place]    Feeling of Stress : To some extent  Social Connections: Moderately Integrated (10/21/2021)   Social Connection and Isolation Panel [NHANES]    Frequency of Communication with Friends and Family: More than three times a week    Frequency of Social Gatherings with Friends and Family: Three times a week    Attends Religious Services: More than 4 times per year    Active Member of Clubs or Organizations: No    Attends Archivist Meetings: Never    Marital Status: Married     Family History: The  patient's family history includes Diabetes in her brother; Heart disease in her father, paternal grandfather, and paternal grandmother; Hypertension in her mother; Stroke in her maternal grandfather, maternal grandmother, and mother. There is no history of Breast cancer.  ROS:   Please see the history of present illness.     All other systems reviewed and are negative.  EKGs/Labs/Other Studies Reviewed:    The following studies were reviewed today:   EKG:  EKG is  ordered today.  The ekg ordered today demonstrates normal sinus rhythm, right bundle branch block  Recent Labs: 12/05/2020: TSH 1.620 06/05/2021: Magnesium 1.8 06/27/2021: ALT 13; B Natriuretic Peptide 197.8 09/16/2021: BUN 21; Creatinine, Ser 0.96; Hemoglobin  13.1; Platelets 154; Potassium 4.5; Sodium 140  Recent Lipid Panel    Component Value Date/Time   CHOL 168 06/05/2021 1438   CHOL 142 01/05/2015 1105   TRIG 179 (H) 06/05/2021 1438   TRIG 138 01/05/2015 1105   HDL 43 06/05/2021 1438   CHOLHDL 3.9 06/05/2021 1438   VLDL 28 01/05/2015 1105   LDLCALC 94 06/05/2021 1438     Risk Assessment/Calculations:          Physical Exam:    VS:  BP 114/66   Pulse 61   Ht '5\' 5"'$  (1.651 m)   Wt 186 lb 6.4 oz (84.6 kg)   LMP  (LMP Unknown)   SpO2 95%   BMI 31.02 kg/m     Wt Readings from Last 3 Encounters:  11/08/21 186 lb 6.4 oz (84.6 kg)  10/07/21 184 lb (83.5 kg)  09/16/21 184 lb 6 oz (83.6 kg)     GEN:  Well nourished, well developed in no acute distress HEENT: Normal NECK: No JVD; No carotid bruits LYMPHATICS: No lymphadenopathy CARDIAC: RRR, no murmurs, rubs, gallops RESPIRATORY: Diminished breath sounds, no wheezing. ABDOMEN: Soft, non-tender, non-distended MUSCULOSKELETAL:  No edema; No deformity  SKIN: Warm and dry NEUROLOGIC:  Alert and oriented x 3 PSYCHIATRIC:  Normal affect   ASSESSMENT:    1. Coronary artery disease involving native coronary artery of native heart without angina pectoris   2.  Hyperlipidemia LDL goal <70   3. Pulmonary hypertension, unspecified ([place])   4. Grade II diastolic dysfunction   5. Primary hypertension     PLAN:    In order of problems listed above:  CAD, RCA calcification on chest CT.  EF 60 to 65%.  Denies chest pain.  Continue Plavix, Crestor 20 mg daily. Hyperlipidemia, Crestor 20 mg daily.  Repeat lipid panel. Pulmonary hypertension, CVA on echo, moderate on right heart cath.  Likely WHO class III (COPD ) versus WHO class II (grade 2 diastolic dysfunction ), Denies edema, continue Lasix as needed.  Refer to pulmonary hypertension clinic. Diastolic dysfunction, appears euvolemic, continue Lasix as needed. Hypertension, BP controlled.  Continue lisinopril.  Follow-up in 6 months     Medication Adjustments/Labs and Tests Ordered: Current medicines are reviewed at length with the patient today.  Concerns regarding medicines are outlined above.  Orders Placed This Encounter  Procedures   Lipid panel   AMB referral to CHF clinic   EKG 12-Lead   Meds ordered this encounter  Medications   rosuvastatin (CRESTOR) 20 MG tablet    Sig: Take 1 tablet (20 mg total) by mouth daily.    Dispense:  90 tablet    Refill:  3    Patient Instructions  Medication Instructions:   Your physician recommends that you continue on your current medications as directed. Please refer to the Current Medication list given to you today.   *If you need a refill on your cardiac medications before your next appointment, please call your pharmacy*   Lab Work:  Your physician recommends that you return for a FASTING lipid profile:  At your earliest convenience.  - You will need to be fasting. Please do not have anything to eat or drink after midnight the morning you have the lab work. You may only have water or black coffee with no cream or sugar.   - Please go to the [facility]. You will check in at the front desk to the right as you walk into the atrium.  Valet  Parking is offered if needed. - No appointment needed. You may go any day between 7 am and 6 pm.    Follow-Up: At [facility], you and your health needs are our priority.  As part of our continuing mission to provide you with exceptional heart care, we have created designated Provider Care Teams.  These Care Teams include your primary Cardiologist (physician) and Advanced Practice Providers (APPs -  Physician Assistants and Nurse Practitioners) who all work together to provide you with the care you need, when you need it.  We recommend signing up for the patient portal called "MyChart".  Sign up information is provided on this After Visit Summary.  MyChart is used to connect with patients for Virtual Visits (Telemedicine).  Patients are able to view lab/test results, encounter notes, upcoming appointments, etc.  Non-urgent messages can be sent to your provider as well.   To learn more about what you can do with MyChart, go to [email or web address].    Your next appointment:   6 month(s)  The format for your next appointment:   In Person  Provider:   Kate Sable, MD    Other Instructions   Important Information About Sugar         Signed, Kate Sable, MD  11/08/2021 2:54 PM    [place]

## 2021-11-25 ENCOUNTER — Other Ambulatory Visit
Admission: RE | Admit: 2021-11-25 | Discharge: 2021-11-25 | Disposition: A | Discharge status: Home / Self Care | Payer: Medicare Other | Attending: Cardiology | Admitting: Cardiology

## 2021-11-25 ENCOUNTER — Telehealth (HOSPITAL_COMMUNITY): Payer: Self-pay | Admitting: Internal Medicine

## 2021-11-25 ENCOUNTER — Telehealth: Payer: Self-pay | Admitting: Cardiology

## 2021-11-25 ENCOUNTER — Encounter (INDEPENDENT_AMBULATORY_CARE_PROVIDER_SITE_OTHER): Payer: Self-pay

## 2021-11-25 DIAGNOSIS — E785 Hyperlipidemia, unspecified: Secondary | ICD-10-CM | POA: Insufficient documentation

## 2021-11-25 LAB — LIPID PANEL
Cholesterol: 145 mg/dL (ref 0–200)
HDL: 36 mg/dL — ABNORMAL LOW (ref >40)
LDL Cholesterol: 80 mg/dL (ref 0–99)
Total CHOL/HDL Ratio: 4 RATIO
Triglycerides: 143 mg/dL (ref <150)
VLDL: 29 mg/dL (ref 0–40)

## 2021-11-25 NOTE — Progress Notes (Unsigned)
LMP  (LMP Unknown)    Subjective:    Patient ID: Brandi Steele, female    DOB: 03/26/1955, 66 y.o.   MRN: [id number]  HPI: Brandi Steele is a 66 y.o. female  No chief complaint on file.  CHF Patient see's Dr. Nehemiah Massed who manages her CHF.  She would like to see someone else. Does not care for Dr. Nehemiah Massed.  COPD COPD status: {Blank single:19197::"controlled","uncontrolled","better","worse","exacerbated","stable"} Satisfied with current treatment?: {Blank single:19197::"yes","no"} Oxygen use: {Blank single:19197::"yes","no"} Dyspnea frequency:  Cough frequency:  Rescue inhaler frequency:   Limitation of activity: {Blank single:19197::"yes","no"} Productive cough:  Last Spirometry:  Pneumovax: {Blank single:19197::"Up to Date","Not up to Date","unknown"} Influenza: {Blank single:19197::"Up to Date","Not up to Date","unknown"}   HYPERTENSION / HYPERLIPIDEMIA Satisfied with current treatment? no Duration of hypertension: years BP monitoring frequency: daily BP range: 110-125/60-70 BP medication side effects: no Past BP meds: lisinopril Duration of hyperlipidemia: years Cholesterol medication side effects: no Cholesterol supplements: none Past cholesterol medications: rosuvastatin (crestor) Medication compliance: excellent compliance Aspirin: no Recent stressors: no Recurrent headaches: no Visual changes: no Palpitations: no Dyspnea: no Chest pain: no Lower extremity edema: no Dizzy/lightheaded: no  ANXIETY Patient has anxiety and PTSD.  She has seen a counselor who has helped her control her symptoms without medication.  However, she does use Xanax on a very rare occasion.  Denies SI.  She is worried about her brother having his EGD tomorrow.   Relevant past medical, surgical, family and social history reviewed and updated as indicated. Interim medical history since our last visit reviewed. Allergies and medications reviewed and updated.  Review of Systems   Eyes:  Negative for visual disturbance.  Respiratory:  Negative for cough, chest tightness and shortness of breath.   Cardiovascular:  Negative for chest pain, palpitations and leg swelling.  Neurological:  Negative for dizziness and headaches.  Psychiatric/Behavioral:  Negative for dysphoric mood and suicidal ideas. The patient is nervous/anxious.     Per HPI unless specifically indicated above     Objective:    LMP  (LMP Unknown)   Wt Readings from Last 3 Encounters:  11/08/21 186 lb 6.4 oz (84.6 kg)  10/07/21 184 lb (83.5 kg)  09/16/21 184 lb 6 oz (83.6 kg)    Physical Exam Vitals and nursing note reviewed.  Constitutional:      General: She is not in acute distress.    Appearance: Normal appearance. She is normal weight. She is not ill-appearing, toxic-appearing or diaphoretic.  HENT:     Head: Normocephalic.     Right Ear: External ear normal.     Left Ear: External ear normal.     Nose: Nose normal.     Mouth/Throat:     Mouth: Mucous membranes are moist.     Pharynx: Oropharynx is clear.  Eyes:     General:        Right eye: No discharge.        Left eye: No discharge.     Extraocular Movements: Extraocular movements intact.     Conjunctiva/sclera: Conjunctivae normal.     Pupils: Pupils are equal, round, and reactive to light.  Cardiovascular:     Rate and Rhythm: Normal rate and regular rhythm.     Heart sounds: No murmur heard. Pulmonary:     Effort: Pulmonary effort is normal. No respiratory distress.     Breath sounds: Normal breath sounds. No wheezing or rales.  Musculoskeletal:     Cervical back: Normal range of motion and  neck supple.  Skin:    General: Skin is warm and dry.     Capillary Refill: Capillary refill takes less than 2 seconds.  Neurological:     General: No focal deficit present.     Mental Status: She is alert and oriented to person, place, and time. Mental status is at baseline.  Psychiatric:        Mood and Affect: Mood normal.         Behavior: Behavior normal.        Thought Content: Thought content normal.        Judgment: Judgment normal.     Results for orders placed or performed during the hospital encounter of 11/25/21  Lipid panel  Result Value Ref Range   Cholesterol 145 0 - 200 mg/dL   Triglycerides 143 <150 mg/dL   HDL 36 (L) >40 mg/dL   Total CHOL/HDL Ratio 4.0 RATIO   VLDL 29 0 - 40 mg/dL   LDL Cholesterol 80 0 - 99 mg/dL      Assessment & Plan:   Problem List Items Addressed This Visit   None    Follow up plan: No follow-ups on file.

## 2021-11-25 NOTE — Telephone Encounter (Signed)
Advised pt that the referral has been placed. Pt states that she has called the heart failure office 2 weeks now and the nurse has not called her back and she has not been scheduled. Advised to give it a few more days as the nurse has been notified again. Pt verbalized understanding and had no additional questions.

## 2021-11-25 NOTE — Telephone Encounter (Signed)
Pt called to f/u w/referral for [facility] office, please advise

## 2021-11-25 NOTE — Telephone Encounter (Signed)
Patient called stating she was referred to pulmonary for her pulmonary hypertension.  She states she still hasn't heard from them to schedule, she was advised to give [place] a call if she didn't hear from them.

## 2021-11-26 ENCOUNTER — Encounter: Payer: Self-pay | Admitting: Nurse Practitioner

## 2021-11-26 ENCOUNTER — Ambulatory Visit (INDEPENDENT_AMBULATORY_CARE_PROVIDER_SITE_OTHER): Payer: Medicare Other | Admitting: Nurse Practitioner

## 2021-11-26 VITALS — BP 110/68 | HR 51 | Temp 98.1°F | Wt 189.5 lb

## 2021-11-26 DIAGNOSIS — I10 Essential (primary) hypertension: Secondary | ICD-10-CM | POA: Diagnosis not present

## 2021-11-26 DIAGNOSIS — I2510 Atherosclerotic heart disease of native coronary artery without angina pectoris: Secondary | ICD-10-CM

## 2021-11-26 DIAGNOSIS — R7301 Impaired fasting glucose: Secondary | ICD-10-CM

## 2021-11-26 DIAGNOSIS — I509 Heart failure, unspecified: Secondary | ICD-10-CM

## 2021-11-26 DIAGNOSIS — I714 Abdominal aortic aneurysm, without rupture, unspecified: Secondary | ICD-10-CM

## 2021-11-26 DIAGNOSIS — I272 Pulmonary hypertension, unspecified: Secondary | ICD-10-CM | POA: Diagnosis not present

## 2021-11-26 DIAGNOSIS — I700 Atherosclerosis of aorta: Secondary | ICD-10-CM

## 2021-11-26 DIAGNOSIS — J432 Centrilobular emphysema: Secondary | ICD-10-CM

## 2021-11-26 DIAGNOSIS — E782 Mixed hyperlipidemia: Secondary | ICD-10-CM

## 2021-11-26 MED ORDER — TRAZODONE HCL 50 MG PO TABS: 25.0000 mg | ORAL_TABLET | Freq: Every day | ORAL | 1 refills | Status: DC

## 2021-11-26 NOTE — Assessment & Plan Note (Signed)
Chronic. Improved since she stopped vaping.  Praised for stopping.  Has not been using inhaler.  Continue to follow up with Pulmonology.

## 2021-11-26 NOTE — Assessment & Plan Note (Signed)
Chronic.  Controlled.  Continue with current medication regimen of Crestor '40mg'$ .  Labs ordered today.  Return to clinic in 3 months for reevaluation.  Call sooner if concerns arise.

## 2021-11-26 NOTE — Assessment & Plan Note (Signed)
Chronic. Found on imaging CT 01/2017.  Recommend she continue taking statin and ASA daily for prevention.  Continue complete cessation of smoking.  Follow up in 3 months.  Call sooner if concerns arise.

## 2021-11-26 NOTE — Assessment & Plan Note (Signed)
Chronic.  Controlled.  Continue with current medication regimen.  Labs ordered today.  Return to clinic in 6 months for reevaluation.  Call sooner if concerns arise.  ? ?

## 2021-11-26 NOTE — Assessment & Plan Note (Signed)
Chronic.  Controlled.  Continue with current medication regimen on Lisinopril 2.'5mg'$  daily.  Labs ordered today.  Return to clinic in 3 months for reevaluation.  Call sooner if concerns arise.

## 2021-11-26 NOTE — Assessment & Plan Note (Signed)
Chronic.  Controlled.  Continue with current medication regimen.  Labs ordered today.  Return to clinic in 3 months for reevaluation.  Call sooner if concerns arise.   

## 2021-11-26 NOTE — Assessment & Plan Note (Signed)
Chronic.  Following with up with Cardiology.  They increased her Crestor to '20mg'$  daily.  Continue to follow their recommendations.   - Reminded to call for an overnight weight gain of >2 pounds or a weekly weight gain of >5 pounds - not adding salt to food and read food labels. Reviewed the importance of keeping daily sodium intake to '2000mg'$  daily. - Avoid Ibuprofen products.

## 2021-11-26 NOTE — Assessment & Plan Note (Signed)
Labs ordered at visit today.  Will make recommendations based on lab results.   

## 2021-11-27 LAB — COMPREHENSIVE METABOLIC PANEL
ALT: 9 IU/L (ref 0–32)
AST: 19 IU/L (ref 0–40)
Albumin/Globulin Ratio: 1.8 (ref 1.2–2.2)
Albumin: 4.4 g/dL (ref 3.9–4.9)
Alkaline Phosphatase: 51 IU/L (ref 44–121)
BUN/Creatinine Ratio: 23 (ref 12–28)
BUN: 21 mg/dL (ref 8–27)
Bilirubin Total: 0.3 mg/dL (ref 0.0–1.2)
CO2: 25 mmol/L (ref 20–29)
Calcium: 9.5 mg/dL (ref 8.7–10.3)
Chloride: 104 mmol/L (ref 96–106)
Creatinine, Ser: 0.91 mg/dL (ref 0.57–1.00)
Globulin, Total: 2.4 g/dL (ref 1.5–4.5)
Glucose: 91 mg/dL (ref 70–99)
Potassium: 4.5 mmol/L (ref 3.5–5.2)
Sodium: 141 mmol/L (ref 134–144)
Total Protein: 6.8 g/dL (ref 6.0–8.5)
eGFR: 69 mL/min/{1.73_m2} (ref >59)

## 2021-11-27 LAB — HEMOGLOBIN A1C
Est. average glucose Bld gHb Est-mCnc: 117 mg/dL
Hgb A1c MFr Bld: 5.7 % — ABNORMAL HIGH (ref 4.8–5.6)

## 2021-11-27 NOTE — Progress Notes (Signed)
Please let patient know that her lab work looks good.  A1c remains in the prediabetic range at 5.7.  No other concerns at this time.  Follow up as discussed.

## 2021-11-29 NOTE — Telephone Encounter (Signed)
Appt scheduled

## 2021-12-01 NOTE — Progress Notes (Signed)
ADVANCED HF CLINIC CONSULT NOTE  Referring Physician:Brian Agbor-Etang, MD Primary Care: Jon Billings, NP Primary Cardiologist: Kate Sable, MD   HPI:  Brandi Steele is a 66 y.o. female with a hx of CAD (RCA calcium on chest CT), HTN, HL, former smoker x40+ years, pulmonary hypertension, COPD, chronic hypoxic respiratory failure on home O2, OSA on CPAP, PAD s/p abdominal aortic surgery over 20 years ago and chronic diastolic HF who presents for follow-up. Referred by Dr. Ralene Bathe for further evaluation of her pulmonary HTN.    Echo 03/2021 ([facility]) EF>55% , moderate TR, severe pulmonary hypertension RVSP 45mHg. Echo 07/2021 EF 60- 65%, severe pulmonary hypertension, RVSP 72.4 mmHg RV mildly reduced Right heart cath 09/2021  RA 5  PA 56/24 (35) PCWP 18 PVR 2.6 [facility] 6.6/3.5 CT chest: 6/23: No PE + mild emphysema No PFTs on chart  Treadmill stress test 03/2021 showed no significant ischemic EKG changes.  Previously followed by Dr,. KNehemiah Massedand recently transitioned care to Dr. AGaren Lah   Has seen Dr. 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 Encouraged to be more compliant with home O2 and encouraged her to proceed with RHC.         Review of Systems: [y] = yes, '[ ]'$  = no   General: Weight gain '[ ]'$ ; Weight loss '[ ]'$ ; Anorexia '[ ]'$ ; Fatigue '[ ]'$ ; Fever '[ ]'$ ; Chills '[ ]'$ ; Weakness '[ ]'$   Cardiac: Chest pain/pressure '[ ]'$ ; Resting SOB '[ ]'$ ; Exertional SOB '[ ]'$ ; Orthopnea '[ ]'$ ; Pedal Edema '[ ]'$ ; Palpitations '[ ]'$ ; Syncope '[ ]'$ ; Presyncope '[ ]'$ ; Paroxysmal nocturnal dyspnea'[ ]'$   Pulmonary: Cough '[ ]'$ ; Wheezing'[ ]'$ ; Hemoptysis'[ ]'$ ; Sputum '[ ]'$ ; Snoring '[ ]'$   GI: Vomiting'[ ]'$ ; Dysphagia'[ ]'$ ; Melena'[ ]'$ ; Hematochezia '[ ]'$ ; Heartburn'[ ]'$ ; Abdominal pain '[ ]'$ ; Constipation '[ ]'$ ; Diarrhea '[ ]'$ ; BRBPR '[ ]'$   GU: Hematuria'[ ]'$ ; Dysuria '[ ]'$ ; Nocturia'[ ]'$   Vascular: Pain in legs with walking '[ ]'$ ; Pain in feet with lying flat '[ ]'$ ; Non-healing sores '[ ]'$ ;  Stroke '[ ]'$ ; TIA '[ ]'$ ; Slurred speech '[ ]'$ ;  Neuro: Headaches'[ ]'$ ; Vertigo'[ ]'$ ; Seizures'[ ]'$ ; Paresthesias'[ ]'$ ;Blurred vision '[ ]'$ ; Diplopia '[ ]'$ ; Vision changes '[ ]'$   Ortho/Skin: Arthritis '[ ]'$ ; Joint pain '[ ]'$ ; Muscle pain '[ ]'$ ; Joint swelling '[ ]'$ ; Back Pain '[ ]'$ ; Rash '[ ]'$   Psych: Depression'[ ]'$ ; Anxiety'[ ]'$   Heme: Bleeding problems '[ ]'$ ; Clotting disorders '[ ]'$ ; Anemia '[ ]'$   Endocrine: Diabetes '[ ]'$ ; Thyroid dysfunction'[ ]'$    Past Medical History:  Diagnosis Date   Anxiety    Aortic stenosis    CHF (congestive heart failure) (HCC)    COPD (chronic obstructive pulmonary disease) (HCC)    Depression    Fibromyalgia    GERD (gastroesophageal reflux disease)    Hyperlipidemia    Hypertension    Hypertensive heart disease    Myalgia    Obesity    Sciatica    Seborrheic keratosis    Stress incontinence     Current Outpatient Medications  Medication Sig Dispense Refill   albuterol (VENTOLIN HFA) 108 (90 Base) MCG/ACT inhaler INHALE 2 PUFFS INTO THE LUNGS EVERY 6 HOURS AS NEEDED FOR WHEEZING OR SHORTNESS OF BREATH 18 g 1   baclofen (LIORESAL) 10 MG tablet TAKE 1 TABLET(10 MG) BY MOUTH THREE TIMES DAILY 30 tablet 0   budesonide-formoterol (SYMBICORT) 160-4.5 MCG/ACT inhaler Inhale 2 puffs into the lungs 2 (  two) times daily. 1 each 5   Calcium Carbonate-Vit D-Min (CALCIUM 1200 PO) Take by mouth daily.     cetirizine (ZYRTEC) 10 MG tablet Take 1 tablet (10 mg total) by mouth daily. 30 tablet 11   clopidogrel (PLAVIX) 75 MG tablet TAKE 1 TABLET(75 MG) BY MOUTH DAILY 90 tablet 1   esomeprazole (NEXIUM) 40 MG capsule Take 1 tab two times daily. 180 capsule 2   furosemide (LASIX) 40 MG tablet Take 1 tablet (40 mg total) by mouth daily as needed. 30 tablet 3   hydrOXYzine (ATARAX) 25 MG tablet TAKE 1 TABLET(25 MG) BY MOUTH THREE TIMES DAILY AS NEEDED 90 tablet 2   lisinopril (ZESTRIL) 2.5 MG tablet TAKE 1 TABLET(2.5 MG) BY MOUTH DAILY 90 tablet 1   Magnesium 400 MG CAPS Take by mouth. Take 6 tablets daily      meloxicam (MOBIC) 15 MG tablet TAKE 1 TABLET(15 MG) BY MOUTH DAILY 90 tablet 3   Potassium Gluconate 595 MG CAPS Take by mouth. Take 4 capsules daily     rosuvastatin (CRESTOR) 20 MG tablet Take 1 tablet (20 mg total) by mouth daily. 90 tablet 3   traZODone (DESYREL) 50 MG tablet Take 0.5 tablets (25 mg total) by mouth at bedtime. 60 tablet 1   triamcinolone cream (KENALOG) 0.1 % Apply 1 application. topically 2 (two) times daily. 80 g 1   Current Facility-Administered Medications  Medication Dose Route Frequency Provider Last Rate Last Admin   sodium chloride flush (NS) 0.9 % injection 3 mL  3 mL Intravenous Q12H Dunn, Ryan M, PA-C        Allergies  Allergen Reactions   Morphine And Related Nausea And Vomiting   Celexa [Citalopram Hydrobromide] Other (See Comments)    Pruritis    Effexor [Venlafaxine] Other (See Comments)    Panic attack   Lipitor [Atorvastatin] Other (See Comments)    Myalgias   Shellfish Allergy Hives and Swelling   Wellbutrin [Bupropion] Anxiety      Social History   Socioeconomic History   Marital status: Married    Spouse name: Not on file   Number of children: Not on file   Years of education: Not on file   Highest education level: Not on file  Occupational History   Not on file  Tobacco Use   Smoking status: Former    Packs/day: 1.00    Years: 40.00    Total pack years: 40.00    Types: Cigarettes    Quit date: 11/01/2016    Years since quitting: 5.0   Smokeless tobacco: Never   Tobacco comments:    Is trying cold-turkey with social support  Vaping Use   Vaping Use: Former   Start date: 12/02/2016   Quit date: 09/06/2021  Substance and Sexual Activity   Alcohol use: No    Alcohol/week: 0.0 standard drinks of alcohol   Drug use: No   Sexual activity: Not Currently  Other Topics Concern   Not on file  Social History Narrative   Not on file   Social Determinants of Health   Financial Resource Strain: Low Risk  (10/21/2021)   Overall  Financial Resource Strain (CARDIA)    Difficulty of Paying Living Expenses: Not hard at all  Food Insecurity: No Food Insecurity (10/21/2021)   Hunger Vital Sign    Worried About Running Out of Food in the Last Year: Never true    Ran Out of Food in the Last Year: Never true  Transportation Needs:  No Transportation Needs (10/21/2021)   PRAPARE - Hydrologist (Medical): No    Lack of Transportation (Non-Medical): No  Physical Activity: Insufficiently Active (10/21/2021)   Exercise Vital Sign    Days of Exercise per Week: 3 days    Minutes of Exercise per Session: 30 min  Stress: Stress Concern Present (10/21/2021)   [place]    Feeling of Stress : To some extent  Social Connections: Moderately Integrated (10/21/2021)   Social Connection and Isolation Panel [NHANES]    Frequency of Communication with Friends and Family: More than three times a week    Frequency of Social Gatherings with Friends and Family: Three times a week    Attends Religious Services: More than 4 times per year    Active Member of Clubs or Organizations: No    Attends Archivist Meetings: Never    Marital Status: Married  Human resources officer Violence: Not At Risk (10/21/2021)   Humiliation, Afraid, Rape, and Kick questionnaire    Fear of Current or Ex-Partner: No    Emotionally Abused: No    Physically Abused: No    Sexually Abused: No      Family History  Problem Relation Age of Onset   Stroke Mother    Hypertension Mother    Heart disease Father    Diabetes Brother    Stroke Maternal Grandmother    Stroke Maternal Grandfather    Heart disease Paternal Grandmother    Heart disease Paternal Grandfather    Breast cancer Neg Hx     There were no vitals filed for this visit.  PHYSICAL EXAM: General:  Well appearing. No respiratory difficulty HEENT: normal Neck: supple. no JVD. Carotids 2+ bilat; no  bruits. No lymphadenopathy or thryomegaly appreciated. Cor: PMI nondisplaced. Regular rate & rhythm. No rubs, gallops or murmurs. Lungs: clear Abdomen: soft, nontender, nondistended. No hepatosplenomegaly. No bruits or masses. Good bowel sounds. Extremities: no cyanosis, clubbing, rash, edema Neuro: alert & oriented x 3, cranial nerves grossly intact. moves all 4 extremities w/o difficulty. Affect pleasant.  ECG:   ASSESSMENT & PLAN:   1. Pulmonary HTN - Echo 03/2021 ([facility]) EF>55% , moderate TR, severe pulmonary hypertension RVSP 79mHg. 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 HSilas Flood 2. Chronic diastolic HF - volume status ok - would add SGLT2i  3. Chronic hypoxic respiratory failure on home O2 - will order PFTs with DLCO  - Stressed need to be complaint with home O2 and CPAP to keep sats > 90% at all times - Follows with Dr. HSilas Flood 4. CAD - per Dr. ALouisa Second MD  1:25 PM

## 2021-12-02 ENCOUNTER — Other Ambulatory Visit (HOSPITAL_COMMUNITY): Payer: Self-pay

## 2021-12-02 ENCOUNTER — Ambulatory Visit: Payer: Medicare Other | Attending: Internal Medicine | Admitting: Internal Medicine

## 2021-12-02 ENCOUNTER — Encounter: Payer: Self-pay | Admitting: Internal Medicine

## 2021-12-02 VITALS — BP 159/64 | HR 63 | Resp 20 | Wt 188.2 lb

## 2021-12-02 DIAGNOSIS — I272 Pulmonary hypertension, unspecified: Secondary | ICD-10-CM | POA: Diagnosis not present

## 2021-12-02 DIAGNOSIS — I2510 Atherosclerotic heart disease of native coronary artery without angina pectoris: Secondary | ICD-10-CM

## 2021-12-02 DIAGNOSIS — I5032 Chronic diastolic (congestive) heart failure: Secondary | ICD-10-CM | POA: Diagnosis not present

## 2021-12-02 MED ORDER — EMPAGLIFLOZIN 10 MG PO TABS: 10.0000 mg | ORAL_TABLET | Freq: Every day | ORAL | 6 refills | Status: DC

## 2021-12-02 NOTE — Patient Instructions (Addendum)
Medication Changes:  START Jardiance 10 mg Daily  Your provider has prescribed Jardiance for you. Please be aware the most common side effect of this medication is urinary tract infections and yeast infections. Please practice good hygiene and keep this area clean and dry to help prevent this. If you do begin to have symptoms of these infections, such as difficulty urinating or painful urination,  please let us know.  Lab Work:  none  Testing/Procedures:  Your physician has recommended that you have a pulmonary function test. Pulmonary Function Tests are a group of tests that measure how well air moves in and out of your lungs. **WE WILL CALL YOU IN A COUPLE OF WEEKS TO SCHEDULE THIS FOR DECEMBER  Referrals:  You have been referred to [facility], they will call you to schedule  Special Instructions // Education:  Do the following things EVERYDAY: Weigh yourself in the morning before breakfast. Write it down and keep it in a log. Take your medicines as prescribed Eat low salt foods--Limit salt (sodium) to 2000 mg per day.  Stay as active as you can everyday Limit all fluids for the day to less than 2 liters   Follow-Up in: 3 months, **WE WILL CALL YOU IN [place] TO SCHEDULE THIS APPOINTMENT    If you have any questions or concerns before your next appointment please send [place] a message through [place] or call our office at [phone]

## 2021-12-23 ENCOUNTER — Telehealth (HOSPITAL_COMMUNITY): Payer: Self-pay | Admitting: Cardiology

## 2021-12-23 NOTE — Telephone Encounter (Signed)
Referral for diabetes and nutrition management cancelled as pt cancelled appt and did not wish to reschedule

## 2021-12-24 ENCOUNTER — Other Ambulatory Visit (HOSPITAL_COMMUNITY): Payer: Self-pay | Admitting: *Deleted

## 2021-12-24 DIAGNOSIS — I272 Pulmonary hypertension, unspecified: Secondary | ICD-10-CM

## 2021-12-31 ENCOUNTER — Other Ambulatory Visit (INDEPENDENT_AMBULATORY_CARE_PROVIDER_SITE_OTHER): Payer: Self-pay | Admitting: Vascular Surgery

## 2021-12-31 DIAGNOSIS — I6523 Occlusion and stenosis of bilateral carotid arteries: Secondary | ICD-10-CM

## 2021-12-31 DIAGNOSIS — I714 Abdominal aortic aneurysm, without rupture, unspecified: Secondary | ICD-10-CM

## 2022-01-02 ENCOUNTER — Other Ambulatory Visit (INDEPENDENT_AMBULATORY_CARE_PROVIDER_SITE_OTHER): Payer: Medicare Other

## 2022-01-02 ENCOUNTER — Ambulatory Visit (INDEPENDENT_AMBULATORY_CARE_PROVIDER_SITE_OTHER): Payer: Medicare Other | Admitting: Vascular Surgery

## 2022-01-02 ENCOUNTER — Encounter (INDEPENDENT_AMBULATORY_CARE_PROVIDER_SITE_OTHER): Payer: Medicare Other

## 2022-01-23 ENCOUNTER — Ambulatory Visit: Payer: Medicare Other | Attending: Internal Medicine

## 2022-01-23 DIAGNOSIS — I272 Pulmonary hypertension, unspecified: Secondary | ICD-10-CM | POA: Insufficient documentation

## 2022-01-23 MED ORDER — ALBUTEROL SULFATE (2.5 MG/3ML) 0.083% IN NEBU
2.5000 mg | INHALATION_SOLUTION | Freq: Once | RESPIRATORY_TRACT | Status: AC
  Filled 2022-01-23: qty 3

## 2022-02-02 ENCOUNTER — Emergency Department: Payer: 59

## 2022-02-02 ENCOUNTER — Other Ambulatory Visit: Payer: Self-pay

## 2022-02-02 ENCOUNTER — Inpatient Hospital Stay
Admission: EM | Admit: 2022-02-02 | Discharge: 2022-02-07 | DRG: 291 | Disposition: A | Discharge status: Home Health | Payer: 59 | Attending: Internal Medicine | Admitting: Internal Medicine

## 2022-02-02 DIAGNOSIS — I2699 Other pulmonary embolism without acute cor pulmonale: Secondary | ICD-10-CM | POA: Diagnosis present

## 2022-02-02 DIAGNOSIS — I272 Pulmonary hypertension, unspecified: Secondary | ICD-10-CM | POA: Diagnosis present

## 2022-02-02 DIAGNOSIS — F32A Depression, unspecified: Secondary | ICD-10-CM | POA: Diagnosis present

## 2022-02-02 DIAGNOSIS — Z7902 Long term (current) use of antithrombotics/antiplatelets: Secondary | ICD-10-CM

## 2022-02-02 DIAGNOSIS — Z87891 Personal history of nicotine dependence: Secondary | ICD-10-CM

## 2022-02-02 DIAGNOSIS — R0789 Other chest pain: Secondary | ICD-10-CM | POA: Diagnosis present

## 2022-02-02 DIAGNOSIS — I10 Essential (primary) hypertension: Secondary | ICD-10-CM | POA: Diagnosis present

## 2022-02-02 DIAGNOSIS — Z91013 Allergy to seafood: Secondary | ICD-10-CM

## 2022-02-02 DIAGNOSIS — I509 Heart failure, unspecified: Secondary | ICD-10-CM

## 2022-02-02 DIAGNOSIS — G4730 Sleep apnea, unspecified: Secondary | ICD-10-CM | POA: Diagnosis present

## 2022-02-02 DIAGNOSIS — I5033 Acute on chronic diastolic (congestive) heart failure: Secondary | ICD-10-CM | POA: Diagnosis present

## 2022-02-02 DIAGNOSIS — I350 Nonrheumatic aortic (valve) stenosis: Secondary | ICD-10-CM | POA: Diagnosis present

## 2022-02-02 DIAGNOSIS — Z79899 Other long term (current) drug therapy: Secondary | ICD-10-CM

## 2022-02-02 DIAGNOSIS — I739 Peripheral vascular disease, unspecified: Secondary | ICD-10-CM | POA: Diagnosis present

## 2022-02-02 DIAGNOSIS — Z888 Allergy status to other drugs, medicaments and biological substances status: Secondary | ICD-10-CM

## 2022-02-02 DIAGNOSIS — Z7951 Long term (current) use of inhaled steroids: Secondary | ICD-10-CM

## 2022-02-02 DIAGNOSIS — I110 Hypertensive heart disease with heart failure: Secondary | ICD-10-CM | POA: Diagnosis not present

## 2022-02-02 DIAGNOSIS — K219 Gastro-esophageal reflux disease without esophagitis: Secondary | ICD-10-CM | POA: Diagnosis present

## 2022-02-02 DIAGNOSIS — J9621 Acute and chronic respiratory failure with hypoxia: Principal | ICD-10-CM | POA: Diagnosis present

## 2022-02-02 DIAGNOSIS — Z885 Allergy status to narcotic agent status: Secondary | ICD-10-CM

## 2022-02-02 DIAGNOSIS — M797 Fibromyalgia: Secondary | ICD-10-CM | POA: Diagnosis present

## 2022-02-02 DIAGNOSIS — Z1152 Encounter for screening for COVID-19: Secondary | ICD-10-CM

## 2022-02-02 DIAGNOSIS — Z9981 Dependence on supplemental oxygen: Secondary | ICD-10-CM

## 2022-02-02 DIAGNOSIS — Z833 Family history of diabetes mellitus: Secondary | ICD-10-CM

## 2022-02-02 DIAGNOSIS — G4733 Obstructive sleep apnea (adult) (pediatric): Secondary | ICD-10-CM | POA: Diagnosis present

## 2022-02-02 DIAGNOSIS — I2510 Atherosclerotic heart disease of native coronary artery without angina pectoris: Secondary | ICD-10-CM | POA: Diagnosis present

## 2022-02-02 DIAGNOSIS — F419 Anxiety disorder, unspecified: Secondary | ICD-10-CM | POA: Diagnosis present

## 2022-02-02 DIAGNOSIS — G4700 Insomnia, unspecified: Secondary | ICD-10-CM | POA: Diagnosis present

## 2022-02-02 DIAGNOSIS — J441 Chronic obstructive pulmonary disease with (acute) exacerbation: Secondary | ICD-10-CM

## 2022-02-02 DIAGNOSIS — Z8249 Family history of ischemic heart disease and other diseases of the circulatory system: Secondary | ICD-10-CM

## 2022-02-02 DIAGNOSIS — Z7984 Long term (current) use of oral hypoglycemic drugs: Secondary | ICD-10-CM

## 2022-02-02 DIAGNOSIS — I4510 Unspecified right bundle-branch block: Secondary | ICD-10-CM | POA: Diagnosis present

## 2022-02-02 DIAGNOSIS — Z823 Family history of stroke: Secondary | ICD-10-CM

## 2022-02-02 DIAGNOSIS — E785 Hyperlipidemia, unspecified: Secondary | ICD-10-CM | POA: Diagnosis present

## 2022-02-02 LAB — CBC
HCT: 42.7 % (ref 36.0–46.0)
Hemoglobin: 13.4 g/dL (ref 12.0–15.0)
MCH: 28.6 pg (ref 26.0–34.0)
MCHC: 31.4 g/dL (ref 30.0–36.0)
MCV: 91.2 fL (ref 80.0–100.0)
Platelets: 125 10*3/uL — ABNORMAL LOW (ref 150–400)
RBC: 4.68 MIL/uL (ref 3.87–5.11)
RDW: 13.5 % (ref 11.5–15.5)
WBC: 6.4 10*3/uL (ref 4.0–10.5)
nRBC: 0.3 % — ABNORMAL HIGH (ref 0.0–0.2)

## 2022-02-02 LAB — BASIC METABOLIC PANEL
Anion gap: 10 (ref 5–15)
BUN: 22 mg/dL (ref 8–23)
CO2: 26 mmol/L (ref 22–32)
Calcium: 8.7 mg/dL — ABNORMAL LOW (ref 8.9–10.3)
Chloride: 103 mmol/L (ref 98–111)
Creatinine, Ser: 0.95 mg/dL (ref 0.44–1.00)
GFR, Estimated: >60 mL/min (ref >60)
Glucose, Bld: 126 mg/dL — ABNORMAL HIGH (ref 70–99)
Potassium: 3.8 mmol/L (ref 3.5–5.1)
Sodium: 139 mmol/L (ref 135–145)

## 2022-02-02 LAB — TROPONIN I (HIGH SENSITIVITY)
Troponin I (High Sensitivity): 4 ng/L (ref <18)
Troponin I (High Sensitivity): 6 ng/L (ref <18)

## 2022-02-02 LAB — RESP PANEL BY RT-PCR (RSV, FLU A&B, COVID)  RVPGX2
Influenza A by PCR: NEGATIVE
Influenza B by PCR: NEGATIVE
Resp Syncytial Virus by PCR: NEGATIVE
SARS Coronavirus 2 by RT PCR: NEGATIVE

## 2022-02-02 LAB — BRAIN NATRIURETIC PEPTIDE: B Natriuretic Peptide: 108.5 pg/mL — ABNORMAL HIGH (ref 0.0–100.0)

## 2022-02-02 LAB — MAGNESIUM: Magnesium: 1.9 mg/dL (ref 1.7–2.4)

## 2022-02-02 MED ORDER — ROSUVASTATIN CALCIUM 10 MG PO TABS
20.0000 mg | ORAL_TABLET | Freq: Every day | ORAL | Status: DC
  Administered 2022-02-02 – 2022-02-06 (×5): 20 mg via ORAL
  Filled 2022-02-02 (×2): qty 2
  Filled 2022-02-02 (×5): qty 1

## 2022-02-02 MED ORDER — MAGNESIUM SULFATE 2 GM/50ML IV SOLN
2.0000 g | INTRAVENOUS | Status: AC
  Administered 2022-02-02: 2 g via INTRAVENOUS
  Filled 2022-02-02: qty 50

## 2022-02-02 MED ORDER — IPRATROPIUM-ALBUTEROL 0.5-2.5 (3) MG/3ML IN SOLN
3.0000 mL | Freq: Four times a day (QID) | RESPIRATORY_TRACT | Status: DC
  Administered 2022-02-02 – 2022-02-05 (×10): 3 mL via RESPIRATORY_TRACT
  Filled 2022-02-02 (×11): qty 3

## 2022-02-02 MED ORDER — ENOXAPARIN SODIUM 60 MG/0.6ML IJ SOSY
0.5000 mg/kg | PREFILLED_SYRINGE | INTRAMUSCULAR | Status: DC
  Administered 2022-02-02 – 2022-02-03 (×2): 42.5 mg via SUBCUTANEOUS
  Filled 2022-02-02 (×2): qty 0.6

## 2022-02-02 MED ORDER — TRAZODONE HCL 50 MG PO TABS
25.0000 mg | ORAL_TABLET | Freq: Every day | ORAL | Status: DC
  Administered 2022-02-03 – 2022-02-05 (×3): 25 mg via ORAL
  Filled 2022-02-02 (×6): qty 1

## 2022-02-02 MED ORDER — ONDANSETRON HCL 4 MG/2ML IJ SOLN: 4.0000 mg | Freq: Four times a day (QID) | INTRAMUSCULAR | Status: DC | PRN

## 2022-02-02 MED ORDER — FUROSEMIDE 10 MG/ML IJ SOLN
40.0000 mg | Freq: Two times a day (BID) | INTRAMUSCULAR | Status: DC
  Administered 2022-02-02 – 2022-02-06 (×8): 40 mg via INTRAVENOUS
  Filled 2022-02-02 (×8): qty 4

## 2022-02-02 MED ORDER — ALBUTEROL SULFATE (2.5 MG/3ML) 0.083% IN NEBU
2.5000 mg | INHALATION_SOLUTION | RESPIRATORY_TRACT | Status: DC | PRN
  Administered 2022-02-05: 2.5 mg via RESPIRATORY_TRACT
  Filled 2022-02-02: qty 3

## 2022-02-02 MED ORDER — IPRATROPIUM-ALBUTEROL 0.5-2.5 (3) MG/3ML IN SOLN
3.0000 mL | Freq: Once | RESPIRATORY_TRACT | Status: AC
  Administered 2022-02-02: 3 mL via RESPIRATORY_TRACT
  Filled 2022-02-02: qty 3

## 2022-02-02 MED ORDER — ACETAMINOPHEN 325 MG PO TABS
650.0000 mg | ORAL_TABLET | Freq: Four times a day (QID) | ORAL | Status: DC | PRN
  Administered 2022-02-03 (×2): 650 mg via ORAL
  Filled 2022-02-02 (×2): qty 2

## 2022-02-02 MED ORDER — SODIUM CHLORIDE 0.9 % IV SOLN: Freq: Once | INTRAVENOUS | Status: AC

## 2022-02-02 MED ORDER — PREDNISONE 20 MG PO TABS: 40.0000 mg | ORAL_TABLET | Freq: Every day | ORAL | Status: DC

## 2022-02-02 MED ORDER — LISINOPRIL 5 MG PO TABS
2.5000 mg | ORAL_TABLET | Freq: Every day | ORAL | Status: DC
  Administered 2022-02-03 – 2022-02-07 (×5): 2.5 mg via ORAL
  Filled 2022-02-02 (×5): qty 1

## 2022-02-02 MED ORDER — ALBUTEROL SULFATE (2.5 MG/3ML) 0.083% IN NEBU
2.5000 mg | INHALATION_SOLUTION | Freq: Once | RESPIRATORY_TRACT | Status: AC
  Administered 2022-02-02: 2.5 mg via RESPIRATORY_TRACT
  Filled 2022-02-02: qty 3

## 2022-02-02 MED ORDER — FUROSEMIDE 10 MG/ML IJ SOLN
40.0000 mg | Freq: Once | INTRAMUSCULAR | Status: AC
  Administered 2022-02-02: 40 mg via INTRAVENOUS
  Filled 2022-02-02: qty 4

## 2022-02-02 MED ORDER — ONDANSETRON HCL 4 MG PO TABS: 4.0000 mg | ORAL_TABLET | Freq: Four times a day (QID) | ORAL | Status: DC | PRN

## 2022-02-02 MED ORDER — ACETAMINOPHEN 650 MG RE SUPP: 650.0000 mg | Freq: Four times a day (QID) | RECTAL | Status: DC | PRN

## 2022-02-02 MED ORDER — METHYLPREDNISOLONE SODIUM SUCC 40 MG IJ SOLR
40.0000 mg | Freq: Two times a day (BID) | INTRAMUSCULAR | Status: DC
  Administered 2022-02-02: 40 mg via INTRAVENOUS
  Filled 2022-02-02: qty 1

## 2022-02-02 NOTE — ED Notes (Signed)
Bedside commode placed in pt room with tissue and wipes

## 2022-02-02 NOTE — Assessment & Plan Note (Addendum)
Scheduled and as needed nebulized bronchodilator treatment IV steroids Antitussives, flutter valve, supportive care

## 2022-02-02 NOTE — Assessment & Plan Note (Addendum)
Right heart cath 09/2021  "Pulmonary hypertension is moderate and seems to be of a mixed arterial and venous etiology "

## 2022-02-02 NOTE — Progress Notes (Signed)
Pt s/u on auto cpap w/ O2 since pt didn't know her home settings. Pt tolerated the mask fitting at earlier time but once cpap put on for QHS she stated it was too hot and uncomfortable and nothing like hers at home and to take from room because she will not be able to wear. RT offered to try a different mask and or settings and pt stated nothing will help.

## 2022-02-02 NOTE — Assessment & Plan Note (Signed)
Controlled.  Continue lisinopril

## 2022-02-02 NOTE — ED Notes (Addendum)
Talked to MD about pt POC. Going to titrate O2 down to 4L on pt and see where patient SPO2 level stays and revaluate POC. Pt normally wears O2 2L baseline at home.

## 2022-02-02 NOTE — Assessment & Plan Note (Addendum)
Suspect multifactorial: Secondary to CHF and COPD exacerbation, moderate pulmonary hypertension and OSA, ex smoker Follow-up respiratory viral panel to evaluate for infectious etiology Low suspicion for acute PE At baseline with 2 L and at night with CPAP with 3 L bleeding O2 sat 82% on arrival of EMS but measured on room air Placed on 4 L for transport but was amenable to baseline 2 L but patient continued to desat to mid 80s Supplemental O2 to keep sats above 90 Treat CHF and COPD as separately outlined

## 2022-02-02 NOTE — H&P (Signed)
History and Physical    Patient: Brandi Steele WHQ:[id number] DOB: 1955/02/22 DOA: 02/02/2022 DOS: the patient was seen and examined on 02/02/2022 PCP: Jon Billings, NP  Patient coming from: Home  Chief Complaint:  Chief Complaint  Patient presents with   Shortness of Breath    HPI: Brandi Steele is a 67 y.o. female with medical history significant for CAD , HTN, HL, former smoker,pulmonary hypertension, diastolic CHF, COPD on home O2 at 2 L, OSA on CPAP, PAD s/p AAA repair and chronic diastolic HF who presents to the ED via EMS with wheezing and shortness of breath associated with chest pain.  She also reported 10 pound weight gain over the past week.  She has been using her breathing treatments and her Lasix without significant relief.  She denies fever or chills and denies lower extremity pain.  O2 sat with EMS was 82% but measured on room air.  She received Solu-Medrol and 2 breathing treatments in route with improvement in O2 sat to 100% on home flow rate of 2 L by arrival. ED course and data review: Afebrile, BP 130/66, pulse in the 1 teens with O2 sat 91% on 2 L, desaturating to 86% with exertion after initial treatment.  Labs with troponin of 4 and BNP 108.  CBC and CMP unremarkable.  Respiratory viral panel not yet done. EKG, and troponin normal.  Personally viewed and interpreted showing NSR at 99 with RBBB and otherwise no changes of acute ischemia.  Chest x-ray showing minimal diffuse interstitial opacity suggesting edema.  No focal airspace opacity.  Patient was treated with IV Lasix and given an additional DuoNeb's as well as IV magnesium. Patient improved with treatment in the ED and was back to baseline O2 however with ambulation she continued to desat to the mid 80s and hospitalist was consulted for admission.   Review of Systems: As mentioned in the history of present illness. All other systems reviewed and are negative.  Past Medical History:  Diagnosis Date   Anxiety     Aortic stenosis    CHF (congestive heart failure) (HCC)    COPD (chronic obstructive pulmonary disease) (HCC)    Depression    Fibromyalgia    GERD (gastroesophageal reflux disease)    Hyperlipidemia    Hypertension    Hypertensive heart disease    Myalgia    Obesity    Sciatica    Seborrheic keratosis    Stress incontinence    Past Surgical History:  Procedure Laterality Date   RIGHT HEART CATH N/A 10/07/2021   Procedure: RIGHT HEART CATH;  Surgeon: Wellington Hampshire, MD;  Location: [facility];  Service: Cardiovascular;  Laterality: N/A;   TONSILLECTOMY     TOTAL ABDOMINAL HYSTERECTOMY     partial   Social History:  reports that she quit smoking about 5 years ago. Her smoking use included cigarettes. She has a 40.00 pack-year smoking history. She has never used smokeless tobacco. She reports that she does not drink alcohol and does not use drugs.  Allergies  Allergen Reactions   Morphine And Related Nausea And Vomiting   Celexa [Citalopram Hydrobromide] Other (See Comments)    Pruritis    Effexor [Venlafaxine] Other (See Comments)    Panic attack   Lipitor [Atorvastatin] Other (See Comments)    Myalgias   Shellfish Allergy Hives and Swelling   Wellbutrin [Bupropion] Anxiety    Family History  Problem Relation Age of Onset   Stroke Mother  Hypertension Mother    Heart disease Father    Diabetes Brother    Stroke Maternal Grandmother    Stroke Maternal Grandfather    Heart disease Paternal Grandmother    Heart disease Paternal Grandfather    Breast cancer Neg Hx     Prior to Admission medications   Medication Sig Start Date End Date Taking? Authorizing Provider  albuterol (VENTOLIN HFA) 108 (90 Base) MCG/ACT inhaler INHALE 2 PUFFS INTO THE LUNGS EVERY 6 HOURS AS NEEDED FOR WHEEZING OR SHORTNESS OF BREATH 05/25/20   Jon Billings, NP  baclofen (LIORESAL) 10 MG tablet TAKE 1 TABLET(10 MG) BY MOUTH THREE TIMES DAILY 06/05/21   Jon Billings, NP   budesonide-formoterol [facility]) 160-4.5 MCG/ACT inhaler Inhale 2 puffs into the lungs 2 (two) times daily. 12/05/20   Jon Billings, NP  Calcium Carbonate-Vit D-Min (CALCIUM 1200 PO) Take by mouth daily.    [provider]  cetirizine (ZYRTEC) 10 MG tablet Take 1 tablet (10 mg total) by mouth daily. 03/03/19   Volney American, PA-C  clopidogrel (PLAVIX) 75 MG tablet TAKE 1 TABLET(75 MG) BY MOUTH DAILY 04/02/21   Jon Billings, NP  empagliflozin (JARDIANCE) 10 MG TABS tablet Take 1 tablet (10 mg total) by mouth daily before breakfast. 12/02/21   Bensimhon, Shaune Pascal, MD  esomeprazole (NEXIUM) 40 MG capsule Take 1 tab two times daily. 12/05/20   Jon Billings, NP  furosemide (LASIX) 40 MG tablet Take 1 tablet (40 mg total) by mouth daily as needed. 10/11/21   Jon Billings, NP  hydrOXYzine (ATARAX) 25 MG tablet TAKE 1 TABLET(25 MG) BY MOUTH THREE TIMES DAILY AS NEEDED 01/30/21   Jon Billings, NP  lisinopril (ZESTRIL) 2.5 MG tablet TAKE 1 TABLET(2.5 MG) BY MOUTH DAILY 10/08/21   Jon Billings, NP  Magnesium 400 MG CAPS Take by mouth. Take 6 tablets daily    [provider]  meloxicam (MOBIC) 15 MG tablet TAKE 1 TABLET(15 MG) BY MOUTH DAILY 12/05/20   Jon Billings, NP  Potassium Gluconate 595 MG CAPS Take by mouth. Take 4 capsules daily    [provider]  rosuvastatin (CRESTOR) 20 MG tablet Take 1 tablet (20 mg total) by mouth daily. 11/08/21   Kate Sable, MD  traZODone (DESYREL) 50 MG tablet Take 0.5 tablets (25 mg total) by mouth at bedtime. 11/26/21   Jon Billings, NP  triamcinolone cream (KENALOG) 0.1 % Apply 1 application. topically 2 (two) times daily. 04/22/21   Jon Billings, NP    Physical Exam: Vitals:   02/02/22 1344 02/02/22 1710 02/02/22 1825 02/02/22 1900  BP:  (!) 116/55 121/63 (!) 124/58  Pulse:  88 (!) 114 (!) 111  Resp:  '15 18 17  '$ Temp:  98.2 F (36.8 C) 97.8 F (36.6 C)   TempSrc:  Oral Oral   SpO2:  98%  90% (!) 86%  Weight: 85.7 kg     Height: '5\' 5"'$  (1.651 m)      Physical Exam Vitals and nursing note reviewed.  Constitutional:      General: She is not in acute distress.    Comments: Conversational dyspnea and speaking in short sentences  HENT:     Head: Normocephalic and atraumatic.  Cardiovascular:     Rate and Rhythm: Regular rhythm. Tachycardia present.     Heart sounds: Normal heart sounds.  Pulmonary:     Effort: Tachypnea present.     Breath sounds: Rales present.  Abdominal:     Palpations: Abdomen is soft.  Tenderness: There is no abdominal tenderness.  Neurological:     Mental Status: Mental status is at baseline.     Labs on Admission: I have personally reviewed following labs and imaging studies  CBC: Recent Labs  Lab 02/02/22 1358  WBC 6.4  HGB 13.4  HCT 42.7  MCV 91.2  PLT 889*   Basic Metabolic Panel: Recent Labs  Lab 02/02/22 1358  NA 139  K 3.8  CL 103  CO2 26  GLUCOSE 126*  BUN 22  CREATININE 0.95  CALCIUM 8.7*  MG 1.9   GFR: Estimated Creatinine Clearance: 61.3 mL/min (by C-G formula based on SCr of 0.95 mg/dL). Liver Function Tests: No results for input(s): "AST", "ALT", "ALKPHOS", "BILITOT", "PROT", "ALBUMIN" in the last 168 hours. No results for input(s): "LIPASE", "AMYLASE" in the last 168 hours. No results for input(s): "AMMONIA" in the last 168 hours. Coagulation Profile: No results for input(s): "INR", "PROTIME" in the last 168 hours. Cardiac Enzymes: No results for input(s): "CKTOTAL", "CKMB", "CKMBINDEX", "TROPONINI" in the last 168 hours. BNP (last 3 results) No results for input(s): "PROBNP" in the last 8760 hours. HbA1C: No results for input(s): "HGBA1C" in the last 72 hours. CBG: No results for input(s): "GLUCAP" in the last 168 hours. Lipid Profile: No results for input(s): "CHOL", "HDL", "LDLCALC", "TRIG", "CHOLHDL", "LDLDIRECT" in the last 72 hours. Thyroid Function Tests: No results for input(s): "TSH",  "T4TOTAL", "FREET4", "T3FREE", "THYROIDAB" in the last 72 hours. Anemia Panel: No results for input(s): "VITAMINB12", "FOLATE", "FERRITIN", "TIBC", "IRON", "RETICCTPCT" in the last 72 hours. Urine analysis:    Component Value Date/Time   COLORURINE Yellow 06/16/2013 1646   APPEARANCEUR Clear 06/17/2021 1515   LABSPEC 1.017 06/16/2013 1646   PHURINE 7.0 06/16/2013 1646   GLUCOSEU Negative 06/17/2021 1515   GLUCOSEU Negative 06/16/2013 1646   HGBUR 1+ 06/16/2013 1646   BILIRUBINUR Negative 06/17/2021 1515   BILIRUBINUR Negative 06/16/2013 1646   KETONESUR Negative 06/16/2013 1646   PROTEINUR Negative 06/17/2021 1515   PROTEINUR Negative 06/16/2013 1646   NITRITE Negative 06/17/2021 1515   NITRITE Negative 06/16/2013 1646   LEUKOCYTESUR Trace (A) 06/17/2021 1515   LEUKOCYTESUR Negative 06/16/2013 1646    Radiological Exams on Admission: DG Chest 2 View  Result Date: 02/02/2022 CLINICAL DATA:  Shortness of breath EXAM: CHEST - 2 VIEW COMPARISON:  06/27/2021 FINDINGS: The heart size and mediastinal contours are within normal limits. Minimal diffuse interstitial pulmonary opacity. The visualized skeletal structures are unremarkable. IMPRESSION: Minimal diffuse interstitial pulmonary opacity, suggesting edema. No focal airspace opacity. Electronically Signed   By: Delanna Ahmadi M.D.   On: 02/02/2022 14:18     Data Reviewed: Relevant notes from primary care and specialist visits, past discharge summaries as available in EHR, including Care Everywhere. Prior diagnostic testing as pertinent to current admission diagnoses Updated medications and problem lists for reconciliation ED course, including vitals, labs, imaging, treatment and response to treatment Triage notes, nursing and pharmacy notes and ED provider's notes Notable results as noted in HPI   Assessment and Plan: * Acute exacerbation of CHF (congestive heart failure) (HCC) BNP minimally elevated at 100 but chest x-ray showing  diffuse interstitial opacities suggesting edema Echo 07/2021 EF 60- 65%, IV Lasix and continue to home lisinopril Daily weights with intake and output monitoring   Acute on chronic respiratory failure with hypoxia ([place]) Suspect multifactorial: Secondary to CHF and COPD exacerbation, moderate pulmonary hypertension and OSA, ex smoker Follow-up respiratory viral panel to evaluate for infectious etiology Low  suspicion for acute PE At baseline with 2 L and at night with CPAP with 3 L bleeding O2 sat 82% on arrival of EMS but measured on room air Placed on 4 L for transport but was amenable to baseline 2 L but patient continued to desat to mid 80s Supplemental O2 to keep sats above 90 Treat CHF and COPD as separately outlined   COPD with acute exacerbation (HCC) Scheduled and as needed nebulized bronchodilator treatment IV steroids Antitussives, flutter valve, supportive care  Pulmonary hypertension ([place]) Right heart cath 09/2021  "Pulmonary hypertension is moderate and seems to be of a mixed arterial and venous etiology "  Sleep apnea OSA on CPAP: With 3 L bleed in.  Followed by pulmonologist, Dr. Silas Flood  CAD (coronary artery disease) Patient with chest pain with increased work of breathing but negative troponin and EKG is nonacute Suspect atypical chest pain secondary to work of breathing Continue rosuvastatin and lisinopril Follows with [facility] cardiology  Essential hypertension Controlled.  Continue lisinopril  Fibromyalgia Continue baclofen, trazodone        DVT prophylaxis: Lovenox  Consults: none  Advance Care Planning:   Code Status: Prior   Family Communication: none  Disposition Plan: Back to previous home environment  Severity of Illness: The appropriate patient status for this patient is INPATIENT. Inpatient status is judged to be reasonable and necessary in order to provide the required intensity of service to ensure the patient's safety. [name]  presenting symptoms, physical exam findings, and initial radiographic and laboratory data in the context of their chronic comorbidities is felt to place them at high risk for further clinical deterioration. Furthermore, it is not anticipated that the patient will be medically stable for discharge from the hospital within 2 midnights of admission.   * I certify that at the point of admission it is my clinical judgment that the patient will require inpatient hospital care spanning beyond 2 midnights from the point of admission due to high intensity of service, high risk for further deterioration and high frequency of surveillance required.*  Author: Athena Masse, MD 02/02/2022 8:04 PM  For on call review [email or web address].

## 2022-02-02 NOTE — ED Triage Notes (Signed)
Pt in via EMS from home with c/o sob. Pt with hx of the same. Pt had a breathing test last week and has had chest pain since then. Pt has taken all breathing meds at home with no relief. 82% RA on EMS arrival. Pt was given '125mg'$  solumedrol and 2 breathing treatments and now 100%

## 2022-02-02 NOTE — ED Notes (Signed)
MD walking with pt in hallway monitoring pt SPO2 levels.

## 2022-02-02 NOTE — Assessment & Plan Note (Signed)
Continue baclofen, trazodone

## 2022-02-02 NOTE — ED Provider Triage Note (Signed)
Emergency Medicine Provider Triage Evaluation Note  Brandi Steele , a 67 y.o. female  was evaluated in triage.  Pt complains of shortness of breath, history of COPD CHF, emphysema.  Review of Systems  Positive:  Negative:  Physical Exam  BP 130/66   Pulse (!) 110   Temp 98.1 F (36.7 C) (Oral)   Resp 20   Ht '5\' 5"'$  (1.651 m)   Wt 85.7 kg   LMP  (LMP Unknown)   SpO2 91%   BMI 31.45 kg/m  Gen:   Awake, no distress   Resp:  Normal effort  MSK:   Moves extremities without difficulty  Other:    Medical Decision Making  Medically screening exam initiated at 1:57 PM.  Appropriate orders placed.  KARIN PINEDO was informed that the remainder of the evaluation will be completed by another provider, this initial triage assessment does not replace that evaluation, and the importance of remaining in the ED until their evaluation is complete.     Versie Starks, PA-C 02/02/22 1358

## 2022-02-02 NOTE — ED Notes (Signed)
Pt ambulatory to toilet again not as SOB this time at 90% on 4L post void and 97% after 2 minutes.

## 2022-02-02 NOTE — Assessment & Plan Note (Signed)
Patient with chest pain with increased work of breathing but negative troponin and EKG is nonacute Suspect atypical chest pain secondary to work of breathing Continue rosuvastatin and lisinopril Follows with [facility] cardiology

## 2022-02-02 NOTE — ED Notes (Addendum)
Pt A&Ox4. Pt ambulatory to toilet and back with steady gait. Pt SOB ambulating and O2 at 84%. Put back on 4L and back at 93% two minutes later.

## 2022-02-02 NOTE — ED Notes (Addendum)
Pt ambulatory to toilet. Pt shaky has not eaten all day. Granddaughter at bedside and brought foot for patient to eat.

## 2022-02-02 NOTE — Assessment & Plan Note (Addendum)
BNP minimally elevated at 100 but chest x-ray showing diffuse interstitial opacities suggesting edema Echo 07/2021 EF 60- 65%, IV Lasix and continue to home lisinopril Daily weights with intake and output monitoring

## 2022-02-02 NOTE — ED Triage Notes (Signed)
Pt states coming in with shortness of breath that started yesterday. Pt states using lasix and her inhalor, but still feels short of breath.  Pt states using 2LPM of supplemental oxygen baseline.  Pt states with oxygen her baseline is 95% Pt reports that EMS gave her a breathing treatment prior to arrival  In triage on 2lpm oxygen is 89%. On 4LPM in triage up to 92%

## 2022-02-02 NOTE — Assessment & Plan Note (Signed)
OSA on CPAP: With 3 L bleed in.  Followed by pulmonologist, Dr. Silas Flood

## 2022-02-02 NOTE — ED Provider Notes (Signed)
[facility] Provider Note    Event Date/Time   First MD Initiated Contact with Patient 02/02/22 1605     (approximate)   History   Chief Complaint: Shortness of Breath   HPI  Brandi Steele is a 67 y.o. female with a history of GERD, COPD, hypertension, CHF who was brought to the ED due to shortness of breath starting yesterday.  Normally uses 2 L nasal cannula oxygen at all times, but with this her oxygen level has been about 85% and she gets severely short of breath with any ambulation.  She is also noticed that she is gained 10 pounds over the last week.  She started taking Lasix yesterday without relief of her symptoms.  Denies chest pain.     Physical Exam   Triage Vital Signs: ED Triage Vitals  Enc Vitals Group     BP 02/02/22 1342 130/66     Pulse Rate 02/02/22 1342 (!) 110     Resp 02/02/22 1342 20     Temp 02/02/22 1342 98.1 F (36.7 C)     Temp Source 02/02/22 1342 Oral     SpO2 02/02/22 1342 91 %     Weight 02/02/22 1344 189 lb (85.7 kg)     Height 02/02/22 1344 '5\' 5"'$  (1.651 m)     Head Circumference --      Peak Flow --      Pain Score 02/02/22 1344 0     Pain Loc --      Pain Edu? --      Excl. in [place]? --     Most recent vital signs: Vitals:   02/02/22 2200 02/02/22 2215  BP:    Pulse: 99   Resp: (!) 23 18  Temp:    SpO2:      General: Awake, no distress.  CV:  Good peripheral perfusion.  Regular rate and rhythm Resp:  Normal effort.  Prolonged expiratory phase.  Diffuse expiratory wheezing. Abd:  No distention.  Soft nontender Other:  Trace peripheral edema bilaterally   ED Results / Procedures / Treatments   Labs (all labs ordered are listed, but only abnormal results are displayed) Labs Reviewed  BASIC METABOLIC PANEL - Abnormal; Notable for the following components:      Result Value   Glucose, Bld 126 (*)    Calcium 8.7 (*)    All other components within normal limits  CBC - Abnormal; Notable for the  following components:   Platelets 125 (*)    nRBC 0.3 (*)    All other components within normal limits  BRAIN NATRIURETIC PEPTIDE - Abnormal; Notable for the following components:   B Natriuretic Peptide 108.5 (*)    All other components within normal limits  RESP PANEL BY RT-PCR (RSV, FLU A&B, COVID)  RVPGX2  MAGNESIUM  HIV ANTIBODY (ROUTINE TESTING W REFLEX)  TROPONIN I (HIGH SENSITIVITY)  TROPONIN I (HIGH SENSITIVITY)     EKG Interpreted by me Normal sinus rhythm rate of 99.  Left axis, right bundle branch block.  No acute ischemic changes.   RADIOLOGY Chest x-ray interpreted by me, no frank pulmonary edema or pleural effusion or consolidation.  Radiology report reviewed noting mild evidence of pulmonary edema   PROCEDURES:  Procedures   MEDICATIONS ORDERED IN ED: Medications  lisinopril (ZESTRIL) tablet 2.5 mg (has no administration in time range)  rosuvastatin (CRESTOR) tablet 20 mg (20 mg Oral Given 02/02/22 2119)  traZODone (DESYREL) tablet 25 mg (25  mg Oral Patient Refused/Not Given 02/02/22 2120)  enoxaparin (LOVENOX) injection 42.5 mg (42.5 mg Subcutaneous Given 02/02/22 2223)  acetaminophen (TYLENOL) tablet 650 mg (650 mg Oral Given 02/03/22 0009)    Or  acetaminophen (TYLENOL) suppository 650 mg ( Rectal See Alternative 02/03/22 0009)  ondansetron (ZOFRAN) tablet 4 mg (has no administration in time range)    Or  ondansetron (ZOFRAN) injection 4 mg (has no administration in time range)  methylPREDNISolone sodium succinate (SOLU-MEDROL) 40 mg/mL injection 40 mg (40 mg Intravenous Given 02/02/22 2120)    Followed by  predniSONE (DELTASONE) tablet 40 mg (has no administration in time range)  ipratropium-albuterol (DUONEB) 0.5-2.5 (3) MG/3ML nebulizer solution 3 mL (3 mLs Nebulization Given 02/02/22 2119)  albuterol (PROVENTIL) (2.5 MG/3ML) 0.083% nebulizer solution 2.5 mg (has no administration in time range)  furosemide (LASIX) injection 40 mg (40 mg Intravenous Given 02/02/22  2119)  ALPRAZolam (XANAX) tablet 0.25 mg (0.25 mg Oral Given 02/03/22 0019)  albuterol (PROVENTIL) (2.5 MG/3ML) 0.083% nebulizer solution 2.5 mg (2.5 mg Nebulization Given 02/02/22 1651)  ipratropium-albuterol (DUONEB) 0.5-2.5 (3) MG/3ML nebulizer solution 3 mL (3 mLs Nebulization Given 02/02/22 1707)  magnesium sulfate IVPB 2 g 50 mL (0 g Intravenous Stopped 02/02/22 1754)  furosemide (LASIX) injection 40 mg (40 mg Intravenous Given 02/02/22 1630)  0.9 %  sodium chloride infusion (0 mLs Intravenous Stopped 02/02/22 1754)     IMPRESSION / MDM / ASSESSMENT AND PLAN / ED COURSE  I reviewed the triage vital signs and the nursing notes.                              Differential diagnosis includes, but is not limited to, COPD exacerbation, CHF exacerbation, pneumonia, pleural effusion, pulmonary edema, anemia, viral illness, electrolyte abnormality, non-STEMI  Patient's presentation is most consistent with acute presentation with potential threat to life or bodily function.  Patient presents with 10 pound weight gain in the last few days along with increased shortness of breath and increased oxygen requirement from her baseline 2 L to now 4 L to maintain adequate oxygenation.  Exam does show wheezing and prolonged expiratory phase along with diminished breath sounds bilaterally, chest x-ray shows evidence of vascular congestion and pulmonary edema.  Will give additional bronchodilators, IV magnesium bolus, IV Lasix, and reassess.  ----------------------------------------- 12:40 AM on 02/03/2022 ----------------------------------------- On reassessment, patient still short of breath, still having wheezing.  At rest oxygen level is 91% on 2 L, but with any ambulation, oxygen saturation drops down to 86% on 2 L.  Placed the patient back in her bed, increased her oxygen to 3 L, case discussed with hospitalist for admission.       FINAL CLINICAL IMPRESSION(S) / ED DIAGNOSES   Final diagnoses:  Acute on  chronic respiratory failure with hypoxia (HCC)  COPD exacerbation (HCC)  Acute on chronic congestive heart failure, unspecified heart failure type ([place])     Rx / DC Orders   ED Discharge Orders     None        Note:  This document was prepared using Dragon voice recognition software and may include unintentional dictation errors.   Carrie Mew, MD 02/03/22 [phone]

## 2022-02-02 NOTE — ED Notes (Signed)
Pt has been staying at SPO2 level of approx. 90-93% on 2L NS. MD aware.

## 2022-02-03 ENCOUNTER — Encounter: Payer: Self-pay | Admitting: Internal Medicine

## 2022-02-03 DIAGNOSIS — J9621 Acute and chronic respiratory failure with hypoxia: Secondary | ICD-10-CM | POA: Diagnosis not present

## 2022-02-03 LAB — HIV ANTIBODY (ROUTINE TESTING W REFLEX): HIV Screen 4th Generation wRfx: NONREACTIVE

## 2022-02-03 LAB — BASIC METABOLIC PANEL
Anion gap: 11 (ref 5–15)
BUN: 29 mg/dL — ABNORMAL HIGH (ref 8–23)
CO2: 27 mmol/L (ref 22–32)
Calcium: 9.2 mg/dL (ref 8.9–10.3)
Chloride: 100 mmol/L (ref 98–111)
Creatinine, Ser: 0.95 mg/dL (ref 0.44–1.00)
GFR, Estimated: >60 mL/min (ref >60)
Glucose, Bld: 166 mg/dL — ABNORMAL HIGH (ref 70–99)
Potassium: 3.7 mmol/L (ref 3.5–5.1)
Sodium: 138 mmol/L (ref 135–145)

## 2022-02-03 MED ORDER — ALPRAZOLAM 0.5 MG PO TABS
1.0000 mg | ORAL_TABLET | Freq: Three times a day (TID) | ORAL | Status: DC | PRN
  Administered 2022-02-03 – 2022-02-05 (×4): 1 mg via ORAL
  Filled 2022-02-03 (×4): qty 2

## 2022-02-03 MED ORDER — CLOPIDOGREL BISULFATE 75 MG PO TABS
75.0000 mg | ORAL_TABLET | Freq: Every day | ORAL | Status: DC
  Administered 2022-02-03 – 2022-02-06 (×4): 75 mg via ORAL
  Filled 2022-02-03 (×4): qty 1

## 2022-02-03 MED ORDER — HYDROXYZINE HCL 25 MG PO TABS
25.0000 mg | ORAL_TABLET | ORAL | Status: DC | PRN
  Administered 2022-02-03 – 2022-02-05 (×2): 25 mg via ORAL
  Filled 2022-02-03 (×2): qty 1

## 2022-02-03 MED ORDER — MELOXICAM 7.5 MG PO TABS
15.0000 mg | ORAL_TABLET | Freq: Every day | ORAL | Status: DC
  Administered 2022-02-03 – 2022-02-07 (×5): 15 mg via ORAL
  Filled 2022-02-03 (×5): qty 2

## 2022-02-03 MED ORDER — BACLOFEN 10 MG PO TABS
10.0000 mg | ORAL_TABLET | Freq: Three times a day (TID) | ORAL | Status: DC
  Administered 2022-02-03 – 2022-02-07 (×13): 10 mg via ORAL
  Filled 2022-02-03 (×14): qty 1

## 2022-02-03 MED ORDER — PANTOPRAZOLE SODIUM 40 MG PO TBEC
40.0000 mg | DELAYED_RELEASE_TABLET | Freq: Every day | ORAL | Status: DC
  Administered 2022-02-03 – 2022-02-07 (×5): 40 mg via ORAL
  Filled 2022-02-03 (×5): qty 1

## 2022-02-03 MED ORDER — PREDNISONE 20 MG PO TABS
40.0000 mg | ORAL_TABLET | Freq: Every day | ORAL | Status: DC
  Administered 2022-02-03 – 2022-02-06 (×4): 40 mg via ORAL
  Filled 2022-02-03 (×4): qty 2

## 2022-02-03 MED ORDER — ALPRAZOLAM 0.25 MG PO TABS
0.2500 mg | ORAL_TABLET | Freq: Every evening | ORAL | Status: DC | PRN
  Administered 2022-02-03: 0.25 mg via ORAL
  Filled 2022-02-03: qty 1

## 2022-02-03 NOTE — Progress Notes (Signed)
Lab called and requested phlebotomist draw for CPOD.

## 2022-02-03 NOTE — Progress Notes (Signed)
Pt up to [facility] independently. Sats down to 87% then back up to 98% once in bed and recovered. Oxygen decreased to 3L and patient remains at 94% O2.

## 2022-02-03 NOTE — Consult Note (Signed)
   Heart Failure Nurse Navigator Note  HFpEF 60-65%.  Grade 2 diastolic dysfunction.  Right ventricular systolic function is mildly reduced.  Severe pulmonary hypertension.  Mild biatrial enlargement.  She presented to the emergency room with complaints of shortness of breath, wheezing, chest tightness, 10 pound weight gain in 1 week.  BNP 108.  Chest x-ray revealed interstitial opacities.  Comorbidities:  COPD on home O2 Obstructive sleep apnea on CPAP Peripheral arterial disease Status post AAA repair Obesity  Medications:  Plavix 75 mg daily Furosemide 40 mg IV every 12 hours Lisinopril 2.5 mg daily Crestor 20 mg daily  Labs:  Sodium 138, potassium 3.7, chloride 100, CO2 27, BUN 29, creatinine 0.95 Weight 87.5  kg Blood pressure 125/77   Initial meeting with patient on this admission.  She is lying in the emergency room on a gurney, complaining of cramping in her hands.  She states that she had recently gained 10 pounds.  She does not weigh herself on a daily basis.  Discussed the importance of daily weights and reporting 2 pound weight gain overnight or 5 pounds within the week.  She states that she recently had returned from a cruise had gained weight prior to leaving on the cruise.  She is requesting to speak to a dietitian help her with planning to eat healthier meals.  She states she used salt substitute. Denies eating processed foods  Does not feel she goes over 64 ounces fluid restrictions, drinks two 16 ounce bottles of water and 2 cups of coffee.     She states that she is compliant with her oxygen use at home and sleeping with the CPAP at night.  She is compliant with her medications.  She had taken a dose of lasic wen she woke up SOB along with her inhalers.  Made aware she has follow up with Dr.Benshimon on January 29 at 11:00 AM.  She has a 3% no show- 3 out of 95 appointments.  Will continue to follow.  Secure chat with Harless Litten for diet  consult.  Pricilla Riffle RN [facility]

## 2022-02-03 NOTE — Plan of Care (Signed)
Nutrition Education Note  RD consulted for nutrition education regarding CHF.  Spoke with Heart Failure Navigator, who reports pt is requesting help with her diet.   Spoke with pt over the phone, who reports "my problem is that I eat too much". Per pt, she generally consumed 2 meals per day and snacks. Meals consist of [place] food (fajta or tacos) or sandwiches. Pt snacks on honey crisp apples with cinnamon, nuts, and lightly salted potato chips. She drinks water and unsweetened tea.   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 Discussed ways to decrease sodium in recipes and diet.   RD provided "Low Sodium Nutrition Therapy" handout from the Academy of [employer] and [employer]etics. Reviewed patient's dietary recall. Provided examples on ways to decrease sodium intake in diet. Discouraged intake of processed foods and use of salt shaker. Encouraged fresh fruits and vegetables as well as whole grain sources of carbohydrates to maximize fiber intake.   RD discussed why it is important for patient to adhere to diet recommendations, and emphasized the role of fluids, foods to avoid, and importance of weighing self daily. Teach back method used.  Expect fair compliance.  Body mass index is 31.45 kg/m. Pt meets criteria for obesity, class I based on current BMI.  Current diet order is 2 gram sodium, patient is consuming approximately n/a% of meals at this time. Labs and medications reviewed. No further nutrition interventions warranted at this time. RD contact information provided. If additional nutrition issues arise, please re-consult RD.   Brandi Steele, RD, LDN, [place] Registered Dietitian II Certified Diabetes Care and Education Specialist Please refer to [facility] for RD and/or RD on-call/weekend/after hours pager

## 2022-02-03 NOTE — ED Notes (Signed)
Patient comes back from bathroom with labored breathing, states her hands are cramping and begins hyperventilating and is tearful, rocking back and forth on the bed stating her hands hurt. O2 97% on 3L [place]. Patient assisted and instructed with slow deep breathing. MD Hollice Gong notified and more home meds were reordered. Patient now relaxed in bed with breathing slowed from previous and medications given.

## 2022-02-03 NOTE — Discharge Instructions (Signed)

## 2022-02-03 NOTE — Progress Notes (Signed)
PROGRESS NOTE  Brandi Steele DDU:[id number] DOB: [birth year] DOA: 02/02/2022 PCP: Jon Billings, NP  Hospital Course/Subjective: Brandi Steele is a 67 y.o. female with medical history significant for CAD , HTN, HL, former smoker,pulmonary hypertension, diastolic CHF, COPD on home O2 at 2 L, OSA on CPAP, PAD s/p AAA repair and chronic diastolic HF who presents to the ED via EMS with wheezing and shortness of breath associated with chest pain.  She was diagnosed with acute on chronic diastolic congestive heart failure, COPD exacerbation, and admitted to the hospitalist service.  This morning, she remains hypoxic with ambulation but saturating well at rest.  She states that she feels she is breathing much better than she was yesterday.  Endorses much improved dyspnea at rest.  Assessment/Plan:  Principal Problem:   Acute exacerbation of CHF (congestive heart failure) (HCC) Active Problems:   Acute on chronic respiratory failure with hypoxia (HCC)   COPD with acute exacerbation (HCC)   Pulmonary hypertension (HCC)   Sleep apnea   Fibromyalgia   Essential hypertension   CAD (coronary artery disease)   Assessment and Plan: * Acute exacerbation of CHF (congestive heart failure) (HCC) BNP minimally elevated at 100 but chest x-ray showing diffuse interstitial opacities suggesting edema Echo 07/2021 EF 60- 65%, IV Lasix and continue home lisinopril Will follow daily labs and replete electrolytes as indicated, BMP pending this morning Daily weights with intake and output monitoring Heart healthy diet  Acute on chronic respiratory failure with hypoxia ([place]) Suspect multifactorial: Secondary to CHF and COPD exacerbation, moderate pulmonary hypertension and OSA, ex smoker Follow-up respiratory viral panel to evaluate for infectious etiology, negative Low suspicion for acute PE At baseline she wears 2 L and at night with CPAP with 3 L bleeding Placed on 4 L for transport but was amenable  to baseline 2 L but patient continued to desat to mid 80s with exertion Supplemental O2 to keep sats above 90 Treat CHF and COPD as separately outlined  COPD with acute exacerbation ([place]) Scheduled and as needed nebulized bronchodilator treatment IV steroids, will transition to oral prednisone today as no wheezing on exam Antitussives, flutter valve, supportive care  Pulmonary hypertension ([place]) Right heart cath 09/2021  "Pulmonary hypertension is moderate and seems to be of a mixed arterial and venous etiology "  Sleep apnea OSA on CPAP: With 3 L bleed in.  Followed by pulmonologist, Dr. Silas Flood  CAD (coronary artery disease) Patient with chest pain with increased work of breathing but negative troponin and EKG is nonacute Suspect atypical chest pain secondary to work of breathing Continue rosuvastatin and lisinopril Follows [facility] cardiology  Essential hypertension Controlled.  Continue lisinopril  Fibromyalgia Continue baclofen, trazodone   DVT Prophylaxis: Lovenox  Code Status: She is a full code  Family Communication: No family present this morning, patient is awake alert and oriented x 4.  Disposition Plan: Home when medically stable  Consultants: None  Procedures: None  Antimicrobials: Anti-infectives (From admission, onward)    None       Objective: Vitals:   02/03/22 0352 02/03/22 0400 02/03/22 0430 02/03/22 0610  BP:  (!) 135/59 115/62 (!) 146/95  Pulse: 76 71 71 78  Resp: 20 (!) '22 18 20  '$ Temp:  98 F (36.7 C)    TempSrc:  Oral    SpO2: 95% 93% 93% 95%  Weight:      Height:        Intake/Output Summary (Last 24 hours) at 02/03/2022 0756 Last  data filed at 02/02/2022 1755 Gross per 24 hour  Intake --  Output 2 ml  Net -2 ml   Filed Weights   02/02/22 1344  Weight: 85.7 kg   Exam: General:  Alert, oriented, calm, in no acute distress sitting comfortably on 3 L this morning.  She has a little bit of dyspnea with speech. Eyes: EOMI,  clear sclerea Neck: supple, no masses, trachea mildline  Cardiovascular: RRR, no murmurs or rubs, no peripheral edema  Respiratory: clear to auscultation bilaterally, equal bilateral breath sounds though a little distant, no wheezes, no crackles; mild tachypnea at rest, no retractions or other evidence of respiratory distress Abdomen: soft, nontender, nondistended, normal bowel tones heard  Skin: dry, no rashes  Musculoskeletal: no joint effusions, normal range of motion  Psychiatric: appropriate affect, normal speech  Neurologic: extraocular muscles intact, clear speech, moving all extremities with intact sensorium   Data Reviewed: CBC: Recent Labs  Lab 02/02/22 1358  WBC 6.4  HGB 13.4  HCT 42.7  MCV 91.2  PLT 829*   Basic Metabolic Panel: Recent Labs  Lab 02/02/22 1358  NA 139  K 3.8  CL 103  CO2 26  GLUCOSE 126*  BUN 22  CREATININE 0.95  CALCIUM 8.7*  MG 1.9   GFR: Estimated Creatinine Clearance: 61.3 mL/min (by C-G formula based on SCr of 0.95 mg/dL). Liver Function Tests: No results for input(s): "AST", "ALT", "ALKPHOS", "BILITOT", "PROT", "ALBUMIN" in the last 168 hours. No results for input(s): "LIPASE", "AMYLASE" in the last 168 hours. No results for input(s): "AMMONIA" in the last 168 hours. Coagulation Profile: No results for input(s): "INR", "PROTIME" in the last 168 hours. Cardiac Enzymes: No results for input(s): "CKTOTAL", "CKMB", "CKMBINDEX", "TROPONINI" in the last 168 hours. BNP (last 3 results) No results for input(s): "PROBNP" in the last 8760 hours. HbA1C: No results for input(s): "HGBA1C" in the last 72 hours. CBG: No results for input(s): "GLUCAP" in the last 168 hours. Lipid Profile: No results for input(s): "CHOL", "HDL", "LDLCALC", "TRIG", "CHOLHDL", "LDLDIRECT" in the last 72 hours. Thyroid Function Tests: No results for input(s): "TSH", "T4TOTAL", "FREET4", "T3FREE", "THYROIDAB" in the last 72 hours. Anemia Panel: No results for  input(s): "VITAMINB12", "FOLATE", "FERRITIN", "TIBC", "IRON", "RETICCTPCT" in the last 72 hours. Urine analysis:    Component Value Date/Time   COLORURINE Yellow 06/16/2013 1646   APPEARANCEUR Clear 06/17/2021 1515   LABSPEC 1.017 06/16/2013 1646   PHURINE 7.0 06/16/2013 1646   GLUCOSEU Negative 06/17/2021 1515   GLUCOSEU Negative 06/16/2013 1646   HGBUR 1+ 06/16/2013 1646   BILIRUBINUR Negative 06/17/2021 1515   BILIRUBINUR Negative 06/16/2013 1646   KETONESUR Negative 06/16/2013 1646   PROTEINUR Negative 06/17/2021 1515   PROTEINUR Negative 06/16/2013 1646   NITRITE Negative 06/17/2021 1515   NITRITE Negative 06/16/2013 1646   LEUKOCYTESUR Trace (A) 06/17/2021 1515   LEUKOCYTESUR Negative 06/16/2013 1646   Sepsis Labs: '@LABRCNTIP'$ (procalcitonin:4,lacticidven:4)  ) Recent Results (from the past 240 hour(s))  Resp panel by RT-PCR (RSV, Flu A&B, Covid) Anterior Nasal Swab     Status: None   Collection Time: 02/02/22  8:19 PM   Specimen: Anterior Nasal Swab  Result Value Ref Range Status   SARS Coronavirus 2 by RT PCR NEGATIVE NEGATIVE Final    Comment: (NOTE) SARS-CoV-2 target nucleic acids are NOT DETECTED.  The SARS-CoV-2 RNA is generally detectable in upper respiratory specimens during the acute phase of infection. The lowest concentration of SARS-CoV-2 viral copies this assay can detect is 138 copies/mL.  A negative result does not preclude SARS-Cov-2 infection and should not be used as the sole basis for treatment or other patient management decisions. A negative result may occur with  improper specimen collection/handling, submission of specimen other than nasopharyngeal swab, presence of viral mutation(s) within the areas targeted by this assay, and inadequate number of viral copies(<138 copies/mL). A negative result must be combined with clinical observations, patient history, and epidemiological information. The expected result is Negative.  Fact Sheet for  Patients:  EntrepreneurPulse.com.au  Fact Sheet for Healthcare Providers:  IncredibleEmployment.be  This test is no t yet approved or cleared by the [place] FDA and  has been authorized for detection and/or diagnosis of SARS-CoV-2 by FDA under an Emergency Use Authorization (EUA). This EUA will remain  in effect (meaning this test can be used) for the duration of the COVID-19 declaration under Section 564(b)(1) of the Act, 21 U.S.C.section 360bbb-3(b)(1), unless the authorization is terminated  or revoked sooner.       Influenza A by PCR NEGATIVE NEGATIVE Final   Influenza B by PCR NEGATIVE NEGATIVE Final    Comment: (NOTE) The Xpert Xpress SARS-CoV-2/FLU/RSV plus assay is intended as an aid in the diagnosis of influenza from Nasopharyngeal swab specimens and should not be used as a sole basis for treatment. Nasal washings and aspirates are unacceptable for Xpert Xpress SARS-CoV-2/FLU/RSV testing.  Fact Sheet for Patients: EntrepreneurPulse.com.au  Fact Sheet for Healthcare Providers: IncredibleEmployment.be  This test is not yet approved or cleared by the [place] FDA and has been authorized for detection and/or diagnosis of SARS-CoV-2 by FDA under an Emergency Use Authorization (EUA). This EUA will remain in effect (meaning this test can be used) for the duration of the COVID-19 declaration under Section 564(b)(1) of the Act, 21 U.S.C. section 360bbb-3(b)(1), unless the authorization is terminated or revoked.     Resp Syncytial Virus by PCR NEGATIVE NEGATIVE Final    Comment: (NOTE) Fact Sheet for Patients: EntrepreneurPulse.com.au  Fact Sheet for Healthcare Providers: IncredibleEmployment.be  This test is not yet approved or cleared by the [place] FDA and has been authorized for detection and/or diagnosis of SARS-CoV-2 by FDA under an Emergency  Use Authorization (EUA). This EUA will remain in effect (meaning this test can be used) for the duration of the COVID-19 declaration under Section 564(b)(1) of the Act, 21 U.S.C. section 360bbb-3(b)(1), unless the authorization is terminated or revoked.  Performed at [facility], [address] [place], [place] [address]      Studies: DG Chest 2 View  Result Date: 02/02/2022 CLINICAL DATA:  Shortness of breath EXAM: CHEST - 2 VIEW COMPARISON:  06/27/2021 FINDINGS: The heart size and mediastinal contours are within normal limits. Minimal diffuse interstitial pulmonary opacity. The visualized skeletal structures are unremarkable. IMPRESSION: Minimal diffuse interstitial pulmonary opacity, suggesting edema. No focal airspace opacity. Electronically Signed   By: Delanna Ahmadi M.D.   On: 02/02/2022 14:18    Scheduled Meds:  enoxaparin (LOVENOX) injection  0.5 mg/kg Subcutaneous Q24H   furosemide  40 mg Intravenous Q12H   ipratropium-albuterol  3 mL Nebulization Q6H   lisinopril  2.5 mg Oral Daily   methylPREDNISolone (SOLU-MEDROL) injection  40 mg Intravenous Q12H   Followed by   Derrill Memo ON 02/04/2022] predniSONE  40 mg Oral Q breakfast   rosuvastatin  20 mg Oral QHS   sodium chloride flush  3 mL Intravenous Q12H   traZODone  25 mg Oral QHS    Continuous Infusions:  LOS: 0 days   Time spent: 26 minutes  Curtis Uriarte Marry Guan, MD [facility] Pager [phone]  If 7PM-7AM, please contact night-coverage [email or web address] Password [facility] 02/03/2022, 7:56 AM

## 2022-02-03 NOTE — Progress Notes (Signed)
Patient ambulated to restroom and back; O2 83% while ambulating on room air. NAD noted. Pt recovered to 93% on 3L after about a minute.

## 2022-02-03 NOTE — Progress Notes (Signed)
O2 increased to 4L while pt sleeping r/t O2 sats in the 80s. Will titrate down as appropriate.

## 2022-02-03 NOTE — ED Notes (Signed)
Report received from McKenzie, RN.

## 2022-02-04 DIAGNOSIS — I5033 Acute on chronic diastolic (congestive) heart failure: Secondary | ICD-10-CM | POA: Diagnosis present

## 2022-02-04 DIAGNOSIS — R0789 Other chest pain: Secondary | ICD-10-CM | POA: Diagnosis present

## 2022-02-04 DIAGNOSIS — I2609 Other pulmonary embolism with acute cor pulmonale: Secondary | ICD-10-CM | POA: Diagnosis not present

## 2022-02-04 DIAGNOSIS — I350 Nonrheumatic aortic (valve) stenosis: Secondary | ICD-10-CM | POA: Diagnosis present

## 2022-02-04 DIAGNOSIS — I2510 Atherosclerotic heart disease of native coronary artery without angina pectoris: Secondary | ICD-10-CM | POA: Diagnosis present

## 2022-02-04 DIAGNOSIS — Z9981 Dependence on supplemental oxygen: Secondary | ICD-10-CM | POA: Diagnosis not present

## 2022-02-04 DIAGNOSIS — G4700 Insomnia, unspecified: Secondary | ICD-10-CM | POA: Diagnosis present

## 2022-02-04 DIAGNOSIS — Z885 Allergy status to narcotic agent status: Secondary | ICD-10-CM | POA: Diagnosis not present

## 2022-02-04 DIAGNOSIS — I4510 Unspecified right bundle-branch block: Secondary | ICD-10-CM | POA: Diagnosis present

## 2022-02-04 DIAGNOSIS — J441 Chronic obstructive pulmonary disease with (acute) exacerbation: Secondary | ICD-10-CM | POA: Diagnosis present

## 2022-02-04 DIAGNOSIS — K219 Gastro-esophageal reflux disease without esophagitis: Secondary | ICD-10-CM | POA: Diagnosis present

## 2022-02-04 DIAGNOSIS — F32A Depression, unspecified: Secondary | ICD-10-CM | POA: Diagnosis present

## 2022-02-04 DIAGNOSIS — Z8249 Family history of ischemic heart disease and other diseases of the circulatory system: Secondary | ICD-10-CM | POA: Diagnosis not present

## 2022-02-04 DIAGNOSIS — I272 Pulmonary hypertension, unspecified: Secondary | ICD-10-CM | POA: Diagnosis present

## 2022-02-04 DIAGNOSIS — F419 Anxiety disorder, unspecified: Secondary | ICD-10-CM | POA: Diagnosis present

## 2022-02-04 DIAGNOSIS — Z1152 Encounter for screening for COVID-19: Secondary | ICD-10-CM | POA: Diagnosis not present

## 2022-02-04 DIAGNOSIS — Z888 Allergy status to other drugs, medicaments and biological substances status: Secondary | ICD-10-CM | POA: Diagnosis not present

## 2022-02-04 DIAGNOSIS — E785 Hyperlipidemia, unspecified: Secondary | ICD-10-CM | POA: Diagnosis present

## 2022-02-04 DIAGNOSIS — I739 Peripheral vascular disease, unspecified: Secondary | ICD-10-CM | POA: Diagnosis present

## 2022-02-04 DIAGNOSIS — I110 Hypertensive heart disease with heart failure: Secondary | ICD-10-CM | POA: Diagnosis present

## 2022-02-04 DIAGNOSIS — I2699 Other pulmonary embolism without acute cor pulmonale: Secondary | ICD-10-CM | POA: Diagnosis not present

## 2022-02-04 DIAGNOSIS — Z87891 Personal history of nicotine dependence: Secondary | ICD-10-CM | POA: Diagnosis not present

## 2022-02-04 DIAGNOSIS — G4733 Obstructive sleep apnea (adult) (pediatric): Secondary | ICD-10-CM | POA: Diagnosis present

## 2022-02-04 DIAGNOSIS — J9621 Acute and chronic respiratory failure with hypoxia: Secondary | ICD-10-CM | POA: Diagnosis present

## 2022-02-04 DIAGNOSIS — M797 Fibromyalgia: Secondary | ICD-10-CM | POA: Diagnosis present

## 2022-02-04 LAB — BASIC METABOLIC PANEL
Anion gap: 8 (ref 5–15)
BUN: 39 mg/dL — ABNORMAL HIGH (ref 8–23)
CO2: 29 mmol/L (ref 22–32)
Calcium: 9 mg/dL (ref 8.9–10.3)
Chloride: 102 mmol/L (ref 98–111)
Creatinine, Ser: 1.09 mg/dL — ABNORMAL HIGH (ref 0.44–1.00)
GFR, Estimated: 55 mL/min — ABNORMAL LOW (ref >60)
Glucose, Bld: 123 mg/dL — ABNORMAL HIGH (ref 70–99)
Potassium: 3.8 mmol/L (ref 3.5–5.1)
Sodium: 139 mmol/L (ref 135–145)

## 2022-02-04 LAB — CBC
HCT: 39.2 % (ref 36.0–46.0)
Hemoglobin: 12.7 g/dL (ref 12.0–15.0)
MCH: 29.3 pg (ref 26.0–34.0)
MCHC: 32.4 g/dL (ref 30.0–36.0)
MCV: 90.3 fL (ref 80.0–100.0)
Platelets: 155 10*3/uL (ref 150–400)
RBC: 4.34 MIL/uL (ref 3.87–5.11)
RDW: 13.9 % (ref 11.5–15.5)
WBC: 10.9 10*3/uL — ABNORMAL HIGH (ref 4.0–10.5)
nRBC: 0 % (ref 0.0–0.2)

## 2022-02-04 MED ORDER — ENOXAPARIN SODIUM 60 MG/0.6ML IJ SOSY
0.5000 mg/kg | PREFILLED_SYRINGE | INTRAMUSCULAR | Status: DC
  Administered 2022-02-04: 42.5 mg via SUBCUTANEOUS
  Filled 2022-02-04: qty 0.6

## 2022-02-04 NOTE — ED Notes (Signed)
Report received from Leala, RN  

## 2022-02-04 NOTE — Progress Notes (Signed)
PROGRESS NOTE  Brandi Steele DVV:[id number] DOB: 07/12/1955 DOA: 02/02/2022 PCP: Jon Billings, NP  Hospital Course/Subjective: Brandi Steele is a 67 y.o. female with medical history significant for CAD , HTN, HL, former smoker,pulmonary hypertension, diastolic CHF, COPD on home O2 at 2 L, OSA on CPAP, PAD s/p AAA repair and chronic diastolic HF who presents to the ED via EMS with wheezing and shortness of breath associated with chest pain.  She was diagnosed with acute on chronic diastolic congestive heart failure, COPD exacerbation, and admitted to the hospitalist service.  This morning, she remains hypoxic with ambulation but saturating well at rest.  She feels she is breathing "okay" but not as well as she does at home. She feels winded with even minimal movement around her room in the ER. Denies any fevers, chills, or chest pain.  Assessment/Plan:  Principal Problem:   Acute exacerbation of CHF (congestive heart failure) (HCC) Active Problems:   Acute on chronic respiratory failure with hypoxia (HCC)   COPD with acute exacerbation (HCC)   Pulmonary hypertension (HCC)   Sleep apnea   Fibromyalgia   Essential hypertension   CAD (coronary artery disease)   Assessment and Plan: * Acute exacerbation of CHF (congestive heart failure) (HCC) BNP minimally elevated at 100 but chest x-ray showing diffuse interstitial opacities suggesting edema, she is diuresing though I/O have not been recorded. Echo 07/2021 EF 60- 65%, IV Lasix and continue home lisinopril Will follow daily labs and replete electrolytes as indicated, BMP stable Daily weights with intake and output monitoring Heart healthy diet  Acute on chronic respiratory failure with hypoxia (HCC) Suspect multifactorial: Secondary to CHF and COPD exacerbation, moderate pulmonary hypertension and OSA, ex smoker Follow-up respiratory viral panel to evaluate for infectious etiology, negative Low suspicion for acute PE At baseline  she wears 2 L and at night with CPAP with 3 L bleeding Supplemental O2 to keep sats above 90 Treat CHF and COPD as separately outlined  COPD with acute exacerbation ([place]) Scheduled and as needed nebulized bronchodilator treatment Was given IV steroids, now on oral prednisone  Antitussives, flutter valve, supportive care  Pulmonary hypertension ([place]) Right heart cath 09/2021  "Pulmonary hypertension is moderate and seems to be of a mixed arterial and venous etiology "  Sleep apnea OSA on CPAP: With 3 L bleed in.  Followed by pulmonologist, Dr. Silas Flood  CAD (coronary artery disease) Patient with chest pain with increased work of breathing but negative troponin and EKG is nonacute Suspect atypical chest pain secondary to work of breathing, this has resolved. Continue rosuvastatin and lisinopril Follows with [facility] cardiology  Essential hypertension Controlled.  Continue lisinopril  Fibromyalgia Continue baclofen, trazodone  DVT Prophylaxis: Lovenox  Code Status: She is a full code  Family Communication: No family present this morning, patient is awake alert and oriented x 4.  Disposition Plan: Home when medically stable likely in next 24-48 hours.  Consultants: None  Procedures: None  Antimicrobials: Anti-infectives (From admission, onward)    None      Objective: Vitals:   02/04/22 0600 02/04/22 0630 02/04/22 0700 02/04/22 0706  BP: (!) 113/53 (!) 120/59 (!) 116/59   Pulse: (!) 57 (!) 58 (!) 59   Resp: '19 19 19   '$ Temp:      TempSrc:      SpO2:    98%  Weight:      Height:       No intake or output data in the 24 hours ending  02/04/22 0758  Filed Weights   02/02/22 1344  Weight: 85.7 kg   Exam: General:  Alert, oriented, calm, in no acute distress, wearing 2L [place]. She became a little tachypneic with the effort of sitting up at the bedside Eyes: EOMI, clear conjuctivae, white sclerea Neck: supple, no masses, trachea mildline  Cardiovascular: RRR, no  murmurs or rubs, no peripheral edema  Respiratory: distant breath sounds, diffuse rhonchi, no cough, no wheezing Abdomen: soft, nontender, nondistended, normal bowel tones heard  Skin: dry, no rashes  Musculoskeletal: no joint effusions, normal range of motion  Psychiatric: appropriate affect, normal speech  Neurologic: extraocular muscles intact, clear speech, moving all extremities with intact sensorium    Data Reviewed: CBC: Recent Labs  Lab 02/02/22 1358 02/04/22 0505  WBC 6.4 10.9*  HGB 13.4 12.7  HCT 42.7 39.2  MCV 91.2 90.3  PLT 125* 094    Basic Metabolic Panel: Recent Labs  Lab 02/02/22 1358 02/03/22 0811 02/04/22 0505  NA 139 138 139  K 3.8 3.7 3.8  CL 103 100 102  CO2 '26 27 29  '$ GLUCOSE 126* 166* 123*  BUN 22 29* 39*  CREATININE 0.95 0.95 1.09*  CALCIUM 8.7* 9.2 9.0  MG 1.9  --   --     GFR: Estimated Creatinine Clearance: 53.4 mL/min (A) (by C-G formula based on SCr of 1.09 mg/dL (H)). Liver Function Tests: No results for input(s): "AST", "ALT", "ALKPHOS", "BILITOT", "PROT", "ALBUMIN" in the last 168 hours. No results for input(s): "LIPASE", "AMYLASE" in the last 168 hours. No results for input(s): "AMMONIA" in the last 168 hours. Coagulation Profile: No results for input(s): "INR", "PROTIME" in the last 168 hours. Cardiac Enzymes: No results for input(s): "CKTOTAL", "CKMB", "CKMBINDEX", "TROPONINI" in the last 168 hours. BNP (last 3 results) No results for input(s): "PROBNP" in the last 8760 hours. HbA1C: No results for input(s): "HGBA1C" in the last 72 hours. CBG: No results for input(s): "GLUCAP" in the last 168 hours. Lipid Profile: No results for input(s): "CHOL", "HDL", "LDLCALC", "TRIG", "CHOLHDL", "LDLDIRECT" in the last 72 hours. Thyroid Function Tests: No results for input(s): "TSH", "T4TOTAL", "FREET4", "T3FREE", "THYROIDAB" in the last 72 hours. Anemia Panel: No results for input(s): "VITAMINB12", "FOLATE", "FERRITIN", "TIBC", "IRON",  "RETICCTPCT" in the last 72 hours. Urine analysis:    Component Value Date/Time   COLORURINE Yellow 06/16/2013 1646   APPEARANCEUR Clear 06/17/2021 1515   LABSPEC 1.017 06/16/2013 1646   PHURINE 7.0 06/16/2013 1646   GLUCOSEU Negative 06/17/2021 1515   GLUCOSEU Negative 06/16/2013 1646   HGBUR 1+ 06/16/2013 1646   BILIRUBINUR Negative 06/17/2021 1515   BILIRUBINUR Negative 06/16/2013 1646   KETONESUR Negative 06/16/2013 1646   PROTEINUR Negative 06/17/2021 1515   PROTEINUR Negative 06/16/2013 1646   NITRITE Negative 06/17/2021 1515   NITRITE Negative 06/16/2013 1646   LEUKOCYTESUR Trace (A) 06/17/2021 1515   LEUKOCYTESUR Negative 06/16/2013 1646   Sepsis Labs: '@LABRCNTIP'$ (procalcitonin:4,lacticidven:4)  ) Recent Results (from the past 240 hour(s))  Resp panel by RT-PCR (RSV, Flu A&B, Covid) Anterior Nasal Swab     Status: None   Collection Time: 02/02/22  8:19 PM   Specimen: Anterior Nasal Swab  Result Value Ref Range Status   SARS Coronavirus 2 by RT PCR NEGATIVE NEGATIVE Final    Comment: (NOTE) SARS-CoV-2 target nucleic acids are NOT DETECTED.  The SARS-CoV-2 RNA is generally detectable in upper respiratory specimens during the acute phase of infection. The lowest concentration of SARS-CoV-2 viral copies this assay can detect is  138 copies/mL. A negative result does not preclude SARS-Cov-2 infection and should not be used as the sole basis for treatment or other patient management decisions. A negative result may occur with  improper specimen collection/handling, submission of specimen other than nasopharyngeal swab, presence of viral mutation(s) within the areas targeted by this assay, and inadequate number of viral copies(<138 copies/mL). A negative result must be combined with clinical observations, patient history, and epidemiological information. The expected result is Negative.  Fact Sheet for Patients:  EntrepreneurPulse.com.au  Fact Sheet  for Healthcare Providers:  IncredibleEmployment.be  This test is no t yet approved or cleared by the [place] FDA and  has been authorized for detection and/or diagnosis of SARS-CoV-2 by FDA under an Emergency Use Authorization (EUA). This EUA will remain  in effect (meaning this test can be used) for the duration of the COVID-19 declaration under Section 564(b)(1) of the Act, 21 U.S.C.section 360bbb-3(b)(1), unless the authorization is terminated  or revoked sooner.       Influenza A by PCR NEGATIVE NEGATIVE Final   Influenza B by PCR NEGATIVE NEGATIVE Final    Comment: (NOTE) The Xpert Xpress SARS-CoV-2/FLU/RSV plus assay is intended as an aid in the diagnosis of influenza from Nasopharyngeal swab specimens and should not be used as a sole basis for treatment. Nasal washings and aspirates are unacceptable for Xpert Xpress SARS-CoV-2/FLU/RSV testing.  Fact Sheet for Patients: EntrepreneurPulse.com.au  Fact Sheet for Healthcare Providers: IncredibleEmployment.be  This test is not yet approved or cleared by the [place] FDA and has been authorized for detection and/or diagnosis of SARS-CoV-2 by FDA under an Emergency Use Authorization (EUA). This EUA will remain in effect (meaning this test can be used) for the duration of the COVID-19 declaration under Section 564(b)(1) of the Act, 21 U.S.C. section 360bbb-3(b)(1), unless the authorization is terminated or revoked.     Resp Syncytial Virus by PCR NEGATIVE NEGATIVE Final    Comment: (NOTE) Fact Sheet for Patients: EntrepreneurPulse.com.au  Fact Sheet for Healthcare Providers: IncredibleEmployment.be  This test is not yet approved or cleared by the [place] FDA and has been authorized for detection and/or diagnosis of SARS-CoV-2 by FDA under an Emergency Use Authorization (EUA). This EUA will remain in effect (meaning  this test can be used) for the duration of the COVID-19 declaration under Section 564(b)(1) of the Act, 21 U.S.C. section 360bbb-3(b)(1), unless the authorization is terminated or revoked.  Performed at [facility], [address] [place], [place] [address]      Studies: No results found.  Scheduled Meds:  baclofen  10 mg Oral TID   clopidogrel  75 mg Oral Daily   enoxaparin (LOVENOX) injection  0.5 mg/kg Subcutaneous Q24H   furosemide  40 mg Intravenous Q12H   ipratropium-albuterol  3 mL Nebulization Q6H   lisinopril  2.5 mg Oral Daily   meloxicam  15 mg Oral Daily   pantoprazole  40 mg Oral Daily   predniSONE  40 mg Oral Q breakfast   rosuvastatin  20 mg Oral QHS   sodium chloride flush  3 mL Intravenous Q12H   traZODone  25 mg Oral QHS    Continuous Infusions:   LOS: 0 days   Time spent: 26 minutes  Ludivina Guymon Marry Guan, MD [facility] Pager [phone]  If 7PM-7AM, please contact night-coverage [email or web address] Password [facility] 02/04/2022, 7:58 AM

## 2022-02-04 NOTE — ED Notes (Signed)
Patient requesting xanax with night time medication

## 2022-02-04 NOTE — ED Notes (Signed)
Respiratory contacted regarding patient bringing her own CPAP. States they will come help get it set up

## 2022-02-05 ENCOUNTER — Other Ambulatory Visit: Payer: Self-pay | Admitting: Nurse Practitioner

## 2022-02-05 DIAGNOSIS — I272 Pulmonary hypertension, unspecified: Secondary | ICD-10-CM

## 2022-02-05 DIAGNOSIS — J441 Chronic obstructive pulmonary disease with (acute) exacerbation: Secondary | ICD-10-CM

## 2022-02-05 DIAGNOSIS — J9621 Acute and chronic respiratory failure with hypoxia: Secondary | ICD-10-CM | POA: Diagnosis not present

## 2022-02-05 LAB — BASIC METABOLIC PANEL
Anion gap: 9 (ref 5–15)
BUN: 40 mg/dL — ABNORMAL HIGH (ref 8–23)
CO2: 31 mmol/L (ref 22–32)
Calcium: 8.7 mg/dL — ABNORMAL LOW (ref 8.9–10.3)
Chloride: 101 mmol/L (ref 98–111)
Creatinine, Ser: 1.03 mg/dL — ABNORMAL HIGH (ref 0.44–1.00)
GFR, Estimated: 59 mL/min — ABNORMAL LOW (ref >60)
Glucose, Bld: 109 mg/dL — ABNORMAL HIGH (ref 70–99)
Potassium: 3.9 mmol/L (ref 3.5–5.1)
Sodium: 141 mmol/L (ref 135–145)

## 2022-02-05 LAB — CBC
HCT: 39.8 % (ref 36.0–46.0)
Hemoglobin: 12.5 g/dL (ref 12.0–15.0)
MCH: 29.1 pg (ref 26.0–34.0)
MCHC: 31.4 g/dL (ref 30.0–36.0)
MCV: 92.8 fL (ref 80.0–100.0)
Platelets: 152 10*3/uL (ref 150–400)
RBC: 4.29 MIL/uL (ref 3.87–5.11)
RDW: 13.9 % (ref 11.5–15.5)
WBC: 8.9 10*3/uL (ref 4.0–10.5)
nRBC: 0 % (ref 0.0–0.2)

## 2022-02-05 MED ORDER — SALINE SPRAY 0.65 % NA SOLN: 1.0000 | NASAL | Status: DC | PRN

## 2022-02-05 MED ORDER — DM-GUAIFENESIN ER 30-600 MG PO TB12
1.0000 | ORAL_TABLET | Freq: Two times a day (BID) | ORAL | Status: DC
  Administered 2022-02-05 – 2022-02-07 (×5): 1 via ORAL
  Filled 2022-02-05 (×5): qty 1

## 2022-02-05 MED ORDER — IPRATROPIUM-ALBUTEROL 0.5-2.5 (3) MG/3ML IN SOLN
3.0000 mL | Freq: Three times a day (TID) | RESPIRATORY_TRACT | Status: DC
  Administered 2022-02-06: 3 mL via RESPIRATORY_TRACT
  Filled 2022-02-05: qty 3

## 2022-02-05 MED ORDER — ALPRAZOLAM 0.5 MG PO TABS
0.5000 mg | ORAL_TABLET | Freq: Three times a day (TID) | ORAL | Status: DC | PRN
  Administered 2022-02-05: 0.5 mg via ORAL
  Filled 2022-02-05: qty 1

## 2022-02-05 MED ORDER — ENOXAPARIN SODIUM 40 MG/0.4ML IJ SOSY
40.0000 mg | PREFILLED_SYRINGE | INTRAMUSCULAR | Status: DC
  Administered 2022-02-05: 40 mg via SUBCUTANEOUS
  Filled 2022-02-05: qty 0.4

## 2022-02-05 NOTE — Telephone Encounter (Signed)
Requested Prescriptions  Pending Prescriptions Disp Refills   lisinopril (ZESTRIL) 2.5 MG tablet [Pharmacy Med Name: LISINOPRIL 2.'5MG'$  TABLETS] 90 tablet 0    Sig: TAKE 1 TABLET(2.5 MG) BY MOUTH DAILY     Cardiovascular:  ACE Inhibitors Failed - 02/05/2022  7:16 AM      Failed - Cr in normal range and within 180 days    Creatinine  Date Value Ref Range Status  06/16/2013 0.63 0.60 - 1.30 mg/dL Final   Creatinine, Ser  Date Value Ref Range Status  02/05/2022 1.03 (H) 0.44 - 1.00 mg/dL Final         Failed - Last BP in normal range    BP Readings from Last 1 Encounters:  02/05/22 100/63         Passed - K in normal range and within 180 days    Potassium  Date Value Ref Range Status  02/05/2022 3.9 3.5 - 5.1 mmol/L Final  06/16/2013 4.0 3.5 - 5.1 mmol/L Final         Passed - Patient is not pregnant      Passed - Valid encounter within last 6 months    Recent Outpatient Visits           2 months ago Chronic congestive heart failure, unspecified heart failure type ([place])   [facility] Jon Billings, NP   5 months ago Mixed hyperlipidemia   [facility] [place], Santiago Glad, NP   7 months ago Acute low back pain, unspecified back pain laterality, unspecified whether sciatica present   [facility] Jon Billings, NP   8 months ago Essential hypertension   [facility] Jon Billings, NP   9 months ago Right lower quadrant abdominal pain   [facility] Jon Billings, NP       Future Appointments             In 3 weeks Jon Billings, NP [place], [facility]            Signed Prescriptions Disp Refills   meloxicam (MOBIC) 15 MG tablet 90 tablet 3    Sig: TAKE 1 TABLET(15 MG) BY MOUTH DAILY     Analgesics:  COX2 Inhibitors Failed - 02/05/2022  7:16 AM      Failed - Manual Review: Labs are only required if the patient has taken medication for more than 8 weeks.      Failed - Cr in  normal range and within 360 days    Creatinine  Date Value Ref Range Status  06/16/2013 0.63 0.60 - 1.30 mg/dL Final   Creatinine, Ser  Date Value Ref Range Status  02/05/2022 1.03 (H) 0.44 - 1.00 mg/dL Final         Passed - HGB in normal range and within 360 days    Hemoglobin  Date Value Ref Range Status  02/05/2022 12.5 12.0 - 15.0 g/dL Final  12/05/2020 13.9 11.1 - 15.9 g/dL Final         Passed - HCT in normal range and within 360 days    HCT  Date Value Ref Range Status  02/05/2022 39.8 36.0 - 46.0 % Final   Hematocrit  Date Value Ref Range Status  12/05/2020 43.1 34.0 - 46.6 % Final         Passed - AST in normal range and within 360 days    AST  Date Value Ref Range Status  11/26/2021 19 0 - 40 IU/L Final   SGOT(AST)  Date Value Ref Range Status  06/16/2013 17 15 - 37 Unit/L Final         Passed - ALT in normal range and within 360 days    ALT  Date Value Ref Range Status  11/26/2021 9 0 - 32 IU/L Final   SGPT (ALT)  Date Value Ref Range Status  06/16/2013 17 12 - 78 U/L Final         Passed - eGFR is 30 or above and within 360 days    EGFR (African American)  Date Value Ref Range Status  06/16/2013 >60  Final   GFR calc Af Amer  Date Value Ref Range Status  12/08/2019 106 >59 mL/min/1.73 Final    Comment:    **In accordance with recommendations from the NKF-ASN Task force,**   [facility] is in the process of updating its eGFR calculation to the   2021 CKD-EPI creatinine equation that estimates kidney function   without a race variable.    EGFR (Non-African Amer.)  Date Value Ref Range Status  06/16/2013 >60  Final    Comment:    eGFR values <55m/min/1.73 m2 may be an indication of chronic kidney disease (CKD). Calculated eGFR is useful in patients with stable renal function. The eGFR calculation will not be reliable in acutely ill patients when serum creatinine is changing rapidly. It is not useful in  patients on dialysis. The eGFR  calculation may not be applicable to patients at the low and high extremes of body sizes, pregnant women, and vegetarians.    GFR, Estimated  Date Value Ref Range Status  02/05/2022 59 (L) >60 mL/min Final    Comment:    (NOTE) Calculated using the CKD-EPI Creatinine Equation (2021)    eGFR  Date Value Ref Range Status  11/26/2021 69 >59 mL/min/1.73 Final         Passed - Patient is not pregnant      Passed - Valid encounter within last 12 months    Recent Outpatient Visits           2 months ago Chronic congestive heart failure, unspecified heart failure type ([place]   [facility]   5 months ago Mixed hyperlipidemia   [facility] [place] [facility]   7 months ago Acute low back pain, unspecified back pain laterality, unspecified whether sciatica present   [place]   8 months ago Essential hypertension   [facility]   9 months ago Right lower quadrant abdominal pain   [facility]       Future Appointments             In 3 weeks HJon Billings NP [place] [place] [facility]             esomeprazole (NEXIUM) 40 MG capsule 180 capsule 2    Sig: TAKE ONE CAPSULE BY MOUTH TWICE DAILY     Gastroenterology: Proton Pump Inhibitors 2 Passed - 02/05/2022  7:16 AM      Passed - ALT in normal range and within 360 days    ALT  Date Value Ref Range Status  11/26/2021 9 0 - 32 IU/L Final   SGPT (ALT)  Date Value Ref Range Status  06/16/2013 17 12 - 78 U/L Final         Passed - AST in normal range and within 360 days    AST  Date Value  Ref Range Status  11/26/2021 19 0 - 40 IU/L Final   SGOT(AST)  Date Value Ref Range Status  06/16/2013 17 15 - 31 Unit/L Final         Passed - Valid encounter within last 12 months    Recent Outpatient Visits           2 months ago Chronic congestive heart failure,  unspecified heart failure type ([place])   [facility] Jon Billings, NP   5 months ago Mixed hyperlipidemia   [facility] Jon Billings, NP   7 months ago Acute low back pain, unspecified back pain laterality, unspecified whether sciatica present   [place], NP   8 months ago Essential hypertension   [facility] Jon Billings, NP   9 months ago Right lower quadrant abdominal pain   [facility] Jon Billings, NP       Future Appointments             In 3 weeks Jon Billings, NP [place], [facility]             clopidogrel (PLAVIX) 75 MG tablet 90 tablet 1    Sig: TAKE 1 TABLET(75 MG) BY MOUTH DAILY     Hematology: Antiplatelets - clopidogrel Failed - 02/05/2022  7:16 AM      Failed - Cr in normal range and within 360 days    Creatinine  Date Value Ref Range Status  06/16/2013 0.63 0.60 - 1.30 mg/dL Final   Creatinine, Ser  Date Value Ref Range Status  02/05/2022 1.03 (H) 0.44 - 1.00 mg/dL Final         Passed - HCT in normal range and within 180 days    HCT  Date Value Ref Range Status  02/05/2022 39.8 36.0 - 46.0 % Final   Hematocrit  Date Value Ref Range Status  12/05/2020 43.1 34.0 - 46.6 % Final         Passed - HGB in normal range and within 180 days    Hemoglobin  Date Value Ref Range Status  02/05/2022 12.5 12.0 - 15.0 g/dL Final  12/05/2020 13.9 11.1 - 15.9 g/dL Final         Passed - PLT in normal range and within 180 days    Platelets  Date Value Ref Range Status  02/05/2022 152 150 - 400 K/uL Final  12/05/2020 164 150 - 450 x10E3/uL Final         Passed - Valid encounter within last 6 months    Recent Outpatient Visits           2 months ago Chronic congestive heart failure, unspecified heart failure type ([place])   [facility] Jon Billings, NP   5 months ago Mixed hyperlipidemia   [place], NP   7 months ago Acute low back pain, unspecified back pain laterality, unspecified whether sciatica present   [place], NP   8 months ago Essential hypertension   [facility] Jon Billings, NP   9 months ago Right lower quadrant abdominal pain   [place], NP       Future Appointments             In 3 weeks Jon Billings, NP [facility], [facility]             furosemide (LASIX) 40 MG tablet 30 tablet 2  Sig: TAKE 1 TABLET(40 MG) BY MOUTH DAILY AS NEEDED     Cardiovascular:  Diuretics - Loop Failed - 02/05/2022  7:16 AM      Failed - Ca in normal range and within 180 days    Calcium  Date Value Ref Range Status  02/05/2022 8.7 (L) 8.9 - 10.3 mg/dL Final   Calcium, Total  Date Value Ref Range Status  06/16/2013 9.0 8.5 - 10.1 mg/dL Final         Failed - Cr in normal range and within 180 days    Creatinine  Date Value Ref Range Status  06/16/2013 0.63 0.60 - 1.30 mg/dL Final   Creatinine, Ser  Date Value Ref Range Status  02/05/2022 1.03 (H) 0.44 - 1.00 mg/dL Final         Failed - Last BP in normal range    BP Readings from Last 1 Encounters:  02/05/22 100/63         Passed - K in normal range and within 180 days    Potassium  Date Value Ref Range Status  02/05/2022 3.9 3.5 - 5.1 mmol/L Final  06/16/2013 4.0 3.5 - 5.1 mmol/L Final         Passed - Na in normal range and within 180 days    Sodium  Date Value Ref Range Status  02/05/2022 141 135 - 145 mmol/L Final  11/26/2021 141 134 - 144 mmol/L Final  06/16/2013 143 136 - 145 mmol/L Final         Passed - Cl in normal range and within 180 days    Chloride  Date Value Ref Range Status  02/05/2022 101 98 - 111 mmol/L Final  06/16/2013 103 98 - 107 mmol/L Final         Passed - Mg Level in normal range and within 180 days    Magnesium  Date Value Ref Range Status  02/02/2022 1.9  1.7 - 2.4 mg/dL Final    Comment:    Performed at [facility], [address] [place], [place] [address]  06/16/2013 1.4 (L) mg/dL Final    Comment:    1.8-2.4 THERAPEUTIC RANGE: 4-7 mg/dL TOXIC: > 10 mg/dL  -----------------------          Passed - Valid encounter within last 6 months    Recent Outpatient Visits           2 months ago Chronic congestive heart failure, unspecified heart failure type [facility] [place])   [facility] Jon Billings, NP   5 months ago Mixed hyperlipidemia   [facility] Jon Billings, NP   7 months ago Acute low back pain, unspecified back pain laterality, unspecified whether sciatica present   [place], NP   8 months ago Essential hypertension   [facility] Jon Billings, NP   9 months ago Right lower quadrant abdominal pain   [facility] Jon Billings, NP       Future Appointments             In 3 weeks Jon Billings, NP [facility], [facility]

## 2022-02-05 NOTE — Evaluation (Signed)
Physical Therapy Evaluation Patient Details Name: Brandi Steele MRN: [id number] DOB: April 07, 1955 Today's Date: 02/05/2022  History of Present Illness  Brandi Steele is a 67 y.o. female with medical history significant for CAD , HTN, HL, former smoker,pulmonary hypertension, diastolic CHF, COPD on home O2 at 2 L, OSA on CPAP, PAD s/p AAA repair and chronic diastolic HF who presents to the ED via EMS with wheezing and shortness of breath associated with chest pain.  She also reported 10 pound weight gain over the past week.  She has been using her breathing treatments and her Lasix without significant relief.  She denies fever or chills and denies lower extremity pain.  O2 sat with EMS was 82% but measured on room air.  She received Solu-Medrol and 2 breathing treatments in route with improvement in O2 sat to 100% on home flow rate of 2 L by arrival.   Clinical Impression  Patient received in bed in ED. Daughter at bedside. She is agreeable to PT assessment. Patient requires min A for bed mobility. She is unsteady with initial standing requiring min A 1-2. Staggering with gait in hallway then became dizzy and was assisted back to bed. Patient's BP was stable. O2 sats with mobility in upper 80%s on 4 liters. Patient reports she is on 2 liters at baseline. Patient will continue to benefit from skilled PT to improve strength, safety and independence with mobility. 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 I am hopeful that she will continue to improve and be able to return home at discharge.         Recommendations for follow up therapy are one component of a multi-disciplinary discharge planning process, led by the attending physician.  Recommendations may be updated based on patient status, additional functional criteria and insurance authorization.  Follow Up Recommendations Home health PT      Assistance Recommended at Discharge Frequent or constant Supervision/Assistance  Patient can return  home with the following  A little help with walking and/or transfers;A little help with bathing/dressing/bathroom;Help with stairs or ramp for entrance;Assist for transportation;Assistance with cooking/housework    Equipment Recommendations Rolling walker (2 wheels)  Recommendations for Other Services       Functional Status Assessment Patient has had a recent decline in their functional status and demonstrates the ability to make significant improvements in function in a reasonable and predictable amount of time.     Precautions / Restrictions Precautions Precautions: Fall Restrictions Weight Bearing Restrictions: No      Mobility  Bed Mobility Overal bed mobility: Needs Assistance Bed Mobility: Supine to Sit, Sit to Supine     Supine to sit: Min assist Sit to supine: Min assist   General bed mobility comments: patient weak    Transfers Overall transfer level: Needs assistance Equipment used: 1 person hand held assist Transfers: Sit to/from Stand Sit to Stand: Min assist           General transfer comment: patient unsteady with standing.    Ambulation/Gait Ambulation/Gait assistance: Min assist Gait Distance (Feet): 50 Feet Assistive device: 2 person hand held assist Gait Pattern/deviations: Step-to pattern, Decreased step length - right, Decreased step length - left, Staggering right, Staggering left Gait velocity: decr     General Gait Details: patient requiring B UE support for ambulation short distance. She is unsteady and at high fall risk. Patient also became lightheaded during mobility and returned to seated then supine position. BP taken at that time which was 119/52. O2  sats down to 88% with minimal mobility on 4 liters.  Stairs            Wheelchair Mobility    Modified Rankin (Stroke Patients Only)       Balance Overall balance assessment: Needs assistance Sitting-balance support: Feet supported Sitting balance-Leahy Scale: Fair      Standing balance support: Bilateral upper extremity supported, During functional activity, Reliant on assistive device for balance Standing balance-Leahy Scale: Poor                               Pertinent Vitals/Pain Pain Assessment Pain Assessment: No/denies pain    Home Living Family/patient expects to be discharged to:: Private residence Living Arrangements: Spouse/significant other;Other relatives Available Help at Discharge: Family;Available PRN/intermittently Type of Home: House         Home Layout: One level Home Equipment: None Additional Comments: patient is caregiver for her husband and brother, daughter able to assist intermittently    Prior Function Prior Level of Function : Independent/Modified Independent             Mobility Comments: was not using AD prior to admission ADLs Comments: independent     Hand Dominance        Extremity/Trunk Assessment   Upper Extremity Assessment Upper Extremity Assessment: Generalized weakness    Lower Extremity Assessment Lower Extremity Assessment: Generalized weakness    Cervical / Trunk Assessment Cervical / Trunk Assessment: Normal  Communication   Communication: No difficulties  Cognition Arousal/Alertness: Awake/alert Behavior During Therapy: WFL for tasks assessed/performed Overall Cognitive Status: Within Functional Limits for tasks assessed                                          General Comments      Exercises     Assessment/Plan    PT Assessment Patient needs continued PT services  PT Problem List Decreased strength;Decreased balance;Decreased activity tolerance;Decreased safety awareness;Cardiopulmonary status limiting activity;Decreased mobility;Decreased knowledge of use of DME;Decreased knowledge of precautions       PT Treatment Interventions DME instruction;Functional mobility training;Gait training;Stair training;Therapeutic exercise;Balance  training;Neuromuscular re-education;Therapeutic activities;Patient/family education    PT Goals (Current goals can be found in the Care Plan section)  Acute Rehab PT Goals Patient Stated Goal: to get home PT Goal Formulation: With patient/family Time For Goal Achievement: 02/19/22 Potential to Achieve Goals: Fair    Frequency Min 2X/week     Co-evaluation               AM-PAC PT "6 Clicks" Mobility  Outcome Measure Help needed turning from your back to your side while in a flat bed without using bedrails?: A Little Help needed moving from lying on your back to sitting on the side of a flat bed without using bedrails?: A Little Help needed moving to and from a bed to a chair (including a wheelchair)?: A Lot Help needed standing up from a chair using your arms (e.g., wheelchair or bedside chair)?: A Lot Help needed to walk in hospital room?: A Lot Help needed climbing 3-5 steps with a railing? : A Lot 6 Click Score: 14    End of Session Equipment Utilized During Treatment: Gait belt;Oxygen Activity Tolerance: Patient limited by fatigue Patient left: in bed;with call bell/phone within reach;with family/visitor present Nurse Communication: Mobility status PT Visit Diagnosis:  Unsteadiness on feet (R26.81);Muscle weakness (generalized) (M62.81);Other abnormalities of gait and mobility (R26.89);Difficulty in walking, not elsewhere classified (R26.2)    Time: 1205-1218 PT Time Calculation (min) (ACUTE ONLY): 13 min   Charges:   PT Evaluation $PT Eval Moderate Complexity: 1 Mod          Prescott Truex, PT, GCS 02/05/22,1:06 PM

## 2022-02-05 NOTE — ED Notes (Signed)
Patient was walking out of room with no oxygen on. I got up from nurse station, stood by patient, and instructed patient to head back into room, patient was assisted back into room. patient stated "I feel like I am going to pass out" Patient was laid back in bed and placed back on oxygen at 3L. Patient was instructed to not get up again with calling nurse first. Patient placed on purewick as patient stated she needed to go to the bathroom

## 2022-02-05 NOTE — ED Notes (Signed)
Patient reports anxiety. Patient tearful and anxious. Tachypnea noted

## 2022-02-05 NOTE — ED Notes (Signed)
Patient ambulated to bathroom with no oxygen on. When patient came back to room patient was tachypneic and oxygen sat was 78% on RA. Patient was immediately placed back on oxygen at 3L and oxygen sat came up to 94%

## 2022-02-05 NOTE — ED Notes (Signed)
Pt ambulatory with x1 assistance, pt increasingly weak. Pt oxygen saturation dropped to 86% while ambulating with 4L [place] on. Pt endorsing dizziness with ambulation. 119/52(69), 85 HR, 92% 4L [place].

## 2022-02-05 NOTE — Progress Notes (Signed)
PROGRESS NOTE  Brandi Steele TIR:[id number] DOB: 1955-07-13 DOA: 02/02/2022 PCP: Jon Billings, NP  Hospital Course/Subjective: Brandi Steele is a 67 y.o. female with medical history significant for CAD , HTN, HL, former smoker,pulmonary hypertension, diastolic CHF, COPD on home O2 at 2 L, OSA on CPAP, PAD s/p AAA repair and chronic diastolic HF who presents to the ED via EMS with wheezing and shortness of breath associated with chest pain.  She was diagnosed with acute on chronic diastolic congestive heart failure, COPD exacerbation, and admitted to the hospitalist service.  This morning, she remains hypoxic with ambulation but saturating well at rest.  She feels she is breathing "okay" but not as well as she does at home. She feels winded with even minimal movement around her room in the ER. Denies any fevers, chills, or chest pain.  Assessment/Plan:  Principal Problem:   Acute exacerbation of CHF (congestive heart failure) (HCC) Active Problems:   Acute on chronic respiratory failure with hypoxia (HCC)   COPD with acute exacerbation (HCC)   Pulmonary hypertension (HCC)   Sleep apnea   Fibromyalgia   Essential hypertension   CAD (coronary artery disease)   Assessment and Plan: * Acute exacerbation of CHF (congestive heart failure) (HCC) BNP minimally elevated at 100 but chest x-ray showing diffuse interstitial opacities suggesting edema, she is diuresing though I/O have not been recorded. Echo 07/2021 EF 60- 65%, Continue IV Lasix  Continue home lisinopril Daily labs and replete electrolytes as indicated, BMP stable Daily weights with intake and output monitoring Heart healthy diet  Acute on chronic respiratory failure with hypoxia (HCC) Suspect multifactorial: Secondary to CHF and COPD exacerbation, moderate pulmonary hypertension and OSA, ex smoker Follow-up respiratory viral panel to evaluate for infectious etiology, negative Low suspicion for acute PE At baseline she  wears 2 L and at night with CPAP with 3 L bleeding Supplemental O2 to keep sats above 90 Treat CHF and COPD as separately outlined Check ambulatory o2 sats Wean O2 as tolerated  COPD with acute exacerbation (HCC) Scheduled and as needed nebulized bronchodilator treatment Was given IV steroids, now on oral prednisone  Antitussives, flutter valve, supportive care Mucinex added  Pulmonary hypertension ([place]) Right heart cath 09/2021  "Pulmonary hypertension is moderate and seems to be of a mixed arterial and venous etiology "  Sleep apnea OSA on CPAP: With 3 L bleed in.  Followed by pulmonologist, Dr. Silas Flood  CAD (coronary artery disease) Patient with chest pain with increased work of breathing but negative troponin and EKG is nonacute Suspect atypical chest pain secondary to work of breathing, this has resolved. Continue rosuvastatin and lisinopril Follows [facility] cardiology  Essential hypertension Controlled.  Continue lisinopril  Fibromyalgia Continue baclofen, trazodone  DVT Prophylaxis: Lovenox  Code Status: She is a full code  Family Communication: Daughter at bedside on rounds this AM 1/10.  Disposition Plan: Home when medically stable likely in next 24-48 hours.  Consultants: None  Procedures: None  Antimicrobials: Anti-infectives (From admission, onward)    None      Objective: Vitals:   02/05/22 1030 02/05/22 1109 02/05/22 1200 02/05/22 1254  BP: 100/63  113/62   Pulse: 92  83   Resp: (!) 26  20   Temp:  98.1 F (36.7 C)    TempSrc:  Oral    SpO2: 91%  94% 92%  Weight:      Height:        Intake/Output Summary (Last 24 hours) at 02/05/2022  1510 Last data filed at 02/05/2022 1455 Gross per 24 hour  Intake --  Output 1500 ml  Net -1500 ml   Filed Weights   02/02/22 1344 02/05/22 0856  Weight: 85.7 kg 83.6 kg   Exam:  General exam: awake, alert, no acute distress, ill-appearing HEENT: moist mucus membranes, hearing grossly normal   Respiratory system: very poor aeration generally diminished, no active wheezes, no rhonchi, mildly incraesed respiratory effort at rest. Cardiovascular system: normal S1/S2, RRR, no pedal edema.   Gastrointestinal system: soft, NT, ND, no HSM felt, +bowel sounds. Central nervous system: A&O x3. no gross focal neurologic deficits, normal speech Extremities: moves al, no edema, normal tone Skin: dry, intact, normal temperature, normal color, No rashes, lesions or ulcers Psychiatry: normal mood, congruent affect, judgement and insight appear normal   Data Reviewed: CBC: Recent Labs  Lab 02/02/22 1358 02/04/22 0505 02/05/22 0441  WBC 6.4 10.9* 8.9  HGB 13.4 12.7 12.5  HCT 42.7 39.2 39.8  MCV 91.2 90.3 92.8  PLT 125* 155 035   Basic Metabolic Panel: Recent Labs  Lab 02/02/22 1358 02/03/22 0811 02/04/22 0505 02/05/22 0441  NA 139 138 139 141  K 3.8 3.7 3.8 3.9  CL 103 100 102 101  CO2 '26 27 29 31  '$ GLUCOSE 126* 166* 123* 109*  BUN 22 29* 39* 40*  CREATININE 0.95 0.95 1.09* 1.03*  CALCIUM 8.7* 9.2 9.0 8.7*  MG 1.9  --   --   --    GFR: Estimated Creatinine Clearance: 55.8 mL/min (A) (by C-G formula based on SCr of 1.03 mg/dL (H)). Liver Function Tests: No results for input(s): "AST", "ALT", "ALKPHOS", "BILITOT", "PROT", "ALBUMIN" in the last 168 hours. No results for input(s): "LIPASE", "AMYLASE" in the last 168 hours. No results for input(s): "AMMONIA" in the last 168 hours. Coagulation Profile: No results for input(s): "INR", "PROTIME" in the last 168 hours. Cardiac Enzymes: No results for input(s): "CKTOTAL", "CKMB", "CKMBINDEX", "TROPONINI" in the last 168 hours. BNP (last 3 results) No results for input(s): "PROBNP" in the last 8760 hours. HbA1C: No results for input(s): "HGBA1C" in the last 72 hours. CBG: No results for input(s): "GLUCAP" in the last 168 hours. Lipid Profile: No results for input(s): "CHOL", "HDL", "LDLCALC", "TRIG", "CHOLHDL", "LDLDIRECT" in  the last 72 hours. Thyroid Function Tests: No results for input(s): "TSH", "T4TOTAL", "FREET4", "T3FREE", "THYROIDAB" in the last 72 hours. Anemia Panel: No results for input(s): "VITAMINB12", "FOLATE", "FERRITIN", "TIBC", "IRON", "RETICCTPCT" in the last 72 hours. Urine analysis:    Component Value Date/Time   COLORURINE Yellow 06/16/2013 1646   APPEARANCEUR Clear 06/17/2021 1515   LABSPEC 1.017 06/16/2013 1646   PHURINE 7.0 06/16/2013 1646   GLUCOSEU Negative 06/17/2021 1515   GLUCOSEU Negative 06/16/2013 1646   HGBUR 1+ 06/16/2013 1646   BILIRUBINUR Negative 06/17/2021 1515   BILIRUBINUR Negative 06/16/2013 1646   KETONESUR Negative 06/16/2013 1646   PROTEINUR Negative 06/17/2021 1515   PROTEINUR Negative 06/16/2013 1646   NITRITE Negative 06/17/2021 1515   NITRITE Negative 06/16/2013 1646   LEUKOCYTESUR Trace (A) 06/17/2021 1515   LEUKOCYTESUR Negative 06/16/2013 1646   Sepsis Labs: '@LABRCNTIP'$ (procalcitonin:4,lacticidven:4)  ) Recent Results (from the past 240 hour(s))  Resp panel by RT-PCR (RSV, Flu A&B, Covid) Anterior Nasal Swab     Status: None   Collection Time: 02/02/22  8:19 PM   Specimen: Anterior Nasal Swab  Result Value Ref Range Status   SARS Coronavirus 2 by RT PCR NEGATIVE NEGATIVE Final  Comment: (NOTE) SARS-CoV-2 target nucleic acids are NOT DETECTED.  The SARS-CoV-2 RNA is generally detectable in upper respiratory specimens during the acute phase of infection. The lowest concentration of SARS-CoV-2 viral copies this assay can detect is 138 copies/mL. A negative result does not preclude SARS-Cov-2 infection and should not be used as the sole basis for treatment or other patient management decisions. A negative result may occur with  improper specimen collection/handling, submission of specimen other than nasopharyngeal swab, presence of viral mutation(s) within the areas targeted by this assay, and inadequate number of viral copies(<138 copies/mL).  A negative result must be combined with clinical observations, patient history, and epidemiological information. The expected result is Negative.  Fact Sheet for Patients:  EntrepreneurPulse.com.au  Fact Sheet for Healthcare Providers:  IncredibleEmployment.be  This test is no t yet approved or cleared by the [place] FDA and  has been authorized for detection and/or diagnosis of SARS-CoV-2 by FDA under an Emergency Use Authorization (EUA). This EUA will remain  in effect (meaning this test can be used) for the duration of the COVID-19 declaration under Section 564(b)(1) of the Act, 21 U.S.C.section 360bbb-3(b)(1), unless the authorization is terminated  or revoked sooner.       Influenza A by PCR NEGATIVE NEGATIVE Final   Influenza B by PCR NEGATIVE NEGATIVE Final    Comment: (NOTE) The Xpert Xpress SARS-CoV-2/FLU/RSV plus assay is intended as an aid in the diagnosis of influenza from Nasopharyngeal swab specimens and should not be used as a sole basis for treatment. Nasal washings and aspirates are unacceptable for Xpert Xpress SARS-CoV-2/FLU/RSV testing.  Fact Sheet for Patients: EntrepreneurPulse.com.au  Fact Sheet for Healthcare Providers: IncredibleEmployment.be  This test is not yet approved or cleared by the [place] FDA and has been authorized for detection and/or diagnosis of SARS-CoV-2 by FDA under an Emergency Use Authorization (EUA). This EUA will remain in effect (meaning this test can be used) for the duration of the COVID-19 declaration under Section 564(b)(1) of the Act, 21 U.S.C. section 360bbb-3(b)(1), unless the authorization is terminated or revoked.     Resp Syncytial Virus by PCR NEGATIVE NEGATIVE Final    Comment: (NOTE) Fact Sheet for Patients: EntrepreneurPulse.com.au  Fact Sheet for Healthcare  Providers: IncredibleEmployment.be  This test is not yet approved or cleared by the [place] FDA and has been authorized for detection and/or diagnosis of SARS-CoV-2 by FDA under an Emergency Use Authorization (EUA). This EUA will remain in effect (meaning this test can be used) for the duration of the COVID-19 declaration under Section 564(b)(1) of the Act, 21 U.S.C. section 360bbb-3(b)(1), unless the authorization is terminated or revoked.  Performed at [facility], [address], [place], [place] [address]      Studies: No results found.  Scheduled Meds:  baclofen  10 mg Oral TID   clopidogrel  75 mg Oral Daily   dextromethorphan-guaiFENesin  1 tablet Oral BID   enoxaparin (LOVENOX) injection  40 mg Subcutaneous Q24H   furosemide  40 mg Intravenous Q12H   ipratropium-albuterol  3 mL Nebulization Q6H   lisinopril  2.5 mg Oral Daily   meloxicam  15 mg Oral Daily   pantoprazole  40 mg Oral Daily   predniSONE  40 mg Oral Q breakfast   rosuvastatin  20 mg Oral QHS   sodium chloride flush  3 mL Intravenous Q12H   traZODone  25 mg Oral QHS    Continuous Infusions:   LOS: 1 day   Time  spent: 40 minutes  Ezekiel Slocumb, DO [facility] Pager [phone]  If 7PM-7AM, please contact night-coverage [email or web address] Password [email or web address] 02/05/2022, 3:10 PM

## 2022-02-05 NOTE — ED Notes (Signed)
Secretary called for transport 

## 2022-02-05 NOTE — ED Notes (Signed)
Report received from Jessica, RN.

## 2022-02-05 NOTE — Telephone Encounter (Signed)
.  rcpro

## 2022-02-06 ENCOUNTER — Inpatient Hospital Stay: Payer: 59

## 2022-02-06 ENCOUNTER — Other Ambulatory Visit (HOSPITAL_COMMUNITY): Payer: Self-pay

## 2022-02-06 ENCOUNTER — Inpatient Hospital Stay (HOSPITAL_COMMUNITY)
Admit: 2022-02-06 | Discharge: 2022-02-06 | Disposition: A | Discharge status: Home / Self Care | Payer: 59 | Attending: Internal Medicine | Admitting: Internal Medicine

## 2022-02-06 DIAGNOSIS — I2699 Other pulmonary embolism without acute cor pulmonale: Secondary | ICD-10-CM | POA: Diagnosis present

## 2022-02-06 DIAGNOSIS — I272 Pulmonary hypertension, unspecified: Secondary | ICD-10-CM | POA: Diagnosis not present

## 2022-02-06 DIAGNOSIS — J441 Chronic obstructive pulmonary disease with (acute) exacerbation: Secondary | ICD-10-CM | POA: Diagnosis not present

## 2022-02-06 DIAGNOSIS — I5033 Acute on chronic diastolic (congestive) heart failure: Secondary | ICD-10-CM | POA: Diagnosis not present

## 2022-02-06 DIAGNOSIS — J9621 Acute and chronic respiratory failure with hypoxia: Secondary | ICD-10-CM | POA: Diagnosis not present

## 2022-02-06 DIAGNOSIS — I2609 Other pulmonary embolism with acute cor pulmonale: Secondary | ICD-10-CM

## 2022-02-06 HISTORY — DX: Other pulmonary embolism without acute cor pulmonale: I26.99

## 2022-02-06 LAB — BASIC METABOLIC PANEL
Anion gap: 11 (ref 5–15)
BUN: 36 mg/dL — ABNORMAL HIGH (ref 8–23)
CO2: 31 mmol/L (ref 22–32)
Calcium: 8.6 mg/dL — ABNORMAL LOW (ref 8.9–10.3)
Chloride: 99 mmol/L (ref 98–111)
Creatinine, Ser: 0.97 mg/dL (ref 0.44–1.00)
GFR, Estimated: >60 mL/min (ref >60)
Glucose, Bld: 101 mg/dL — ABNORMAL HIGH (ref 70–99)
Potassium: 3.6 mmol/L (ref 3.5–5.1)
Sodium: 141 mmol/L (ref 135–145)

## 2022-02-06 LAB — CBC
HCT: 40.6 % (ref 36.0–46.0)
Hemoglobin: 13 g/dL (ref 12.0–15.0)
MCH: 29 pg (ref 26.0–34.0)
MCHC: 32 g/dL (ref 30.0–36.0)
MCV: 90.4 fL (ref 80.0–100.0)
Platelets: 159 10*3/uL (ref 150–400)
RBC: 4.49 MIL/uL (ref 3.87–5.11)
RDW: 13.4 % (ref 11.5–15.5)
WBC: 9.3 10*3/uL (ref 4.0–10.5)
nRBC: 0 % (ref 0.0–0.2)

## 2022-02-06 LAB — PROTIME-INR
INR: 1 (ref 0.8–1.2)
Prothrombin Time: 13.2 seconds (ref 11.4–15.2)

## 2022-02-06 LAB — APTT: aPTT: 27 seconds (ref 24–36)

## 2022-02-06 LAB — ECHOCARDIOGRAM COMPLETE
Height: 65 in
S' Lateral: 2.5 cm
Weight: 2948.52 oz

## 2022-02-06 LAB — D-DIMER, QUANTITATIVE: D-Dimer, Quant: 1.73 ug/mL-FEU — ABNORMAL HIGH (ref 0.00–0.50)

## 2022-02-06 LAB — MAGNESIUM: Magnesium: 2.3 mg/dL (ref 1.7–2.4)

## 2022-02-06 LAB — HEPARIN LEVEL (UNFRACTIONATED): Heparin Unfractionated: 0.82 IU/mL — ABNORMAL HIGH (ref 0.30–0.70)

## 2022-02-06 MED ORDER — POTASSIUM CHLORIDE CRYS ER 20 MEQ PO TBCR
40.0000 meq | EXTENDED_RELEASE_TABLET | Freq: Once | ORAL | Status: AC
  Administered 2022-02-06: 40 meq via ORAL
  Filled 2022-02-06: qty 2

## 2022-02-06 MED ORDER — HEPARIN BOLUS VIA INFUSION
4500.0000 [IU] | Freq: Once | INTRAVENOUS | Status: AC
  Administered 2022-02-06: 4500 [IU] via INTRAVENOUS
  Filled 2022-02-06: qty 4500

## 2022-02-06 MED ORDER — ALPRAZOLAM 0.5 MG PO TABS
0.5000 mg | ORAL_TABLET | Freq: Three times a day (TID) | ORAL | Status: DC
  Administered 2022-02-06 – 2022-02-07 (×3): 0.5 mg via ORAL
  Filled 2022-02-06 (×3): qty 1

## 2022-02-06 MED ORDER — HEPARIN (PORCINE) 25000 UT/250ML-% IV SOLN
950.0000 [IU]/h | INTRAVENOUS | Status: DC
  Administered 2022-02-06: 1350 [IU]/h via INTRAVENOUS
  Filled 2022-02-06: qty 250

## 2022-02-06 MED ORDER — IOHEXOL 350 MG/ML SOLN
75.0000 mL | Freq: Once | INTRAVENOUS | Status: AC | PRN
  Administered 2022-02-06: 75 mL via INTRAVENOUS

## 2022-02-06 NOTE — Progress Notes (Signed)
CSW notes HH PT recs, CSW went to patient's room to discuss recommendations, patient not in room at this time. CSW will circle back when time allows.   Kelby Fam, [place], MSW, [place]

## 2022-02-06 NOTE — Consult Note (Addendum)
ANTICOAGULATION CONSULT NOTE - Initial Consult  Pharmacy Consult for Heparin Indication: pulmonary embolus  Allergies  Allergen Reactions   Morphine And Related Nausea And Vomiting   Celexa [Citalopram Hydrobromide] Other (See Comments)    Pruritis    Effexor [Venlafaxine] Other (See Comments)    Panic attack   Lipitor [Atorvastatin] Other (See Comments)    Myalgias   Shellfish Allergy Hives and Swelling   Wellbutrin [Bupropion] Anxiety    Patient Measurements: Height: '5\' 5"'$  (165.1 cm) Weight: 83.6 kg (184 lb 4.5 oz) IBW/kg (Calculated) : 57 Heparin Dosing Weight: 75.6 kg  Vital Signs: Temp: 98.2 F (36.8 C) (01/11 0800) Temp Source: Oral (01/11 0800) BP: 120/84 (01/11 0800) Pulse Rate: 70 (01/11 0800)  Labs: Recent Labs    02/04/22 0505 02/05/22 0441 02/06/22 0341  HGB 12.7 12.5 13.0  HCT 39.2 39.8 40.6  PLT 155 152 159  CREATININE 1.09* 1.03* 0.97    Estimated Creatinine Clearance: 59.2 mL/min (by C-G formula based on SCr of 0.97 mg/dL).   Medical History: Past Medical History:  Diagnosis Date   Anxiety    Aortic stenosis    CHF (congestive heart failure) (HCC)    COPD (chronic obstructive pulmonary disease) (HCC)    Depression    Fibromyalgia    GERD (gastroesophageal reflux disease)    Hyperlipidemia    Hypertension    Hypertensive heart disease    Myalgia    Obesity    Sciatica    Seborrheic keratosis    Stress incontinence     Medications:  Facility-Administered Medications Prior to Admission  Medication Dose Route Frequency Provider Last Rate Last Admin   sodium chloride flush (NS) 0.9 % injection 3 mL  3 mL Intravenous Q12H Dunn, Ryan M, PA-C       Medications Prior to Admission  Medication Sig Dispense Refill Last Dose   albuterol (VENTOLIN HFA) 108 (90 Base) MCG/ACT inhaler INHALE 2 PUFFS INTO THE LUNGS EVERY 6 HOURS AS NEEDED FOR WHEEZING OR SHORTNESS OF BREATH 18 g 1 prn   baclofen (LIORESAL) 10 MG tablet TAKE 1 TABLET(10 MG) BY  MOUTH THREE TIMES DAILY 30 tablet 0 prn   budesonide-formoterol (SYMBICORT) 160-4.5 MCG/ACT inhaler Inhale 2 puffs into the lungs 2 (two) times daily. 1 each 5 02/01/2022   Calcium Carbonate-Vit D-Min (CALCIUM 1200 PO) Take by mouth daily.   02/01/2022   cetirizine (ZYRTEC) 10 MG tablet Take 1 tablet (10 mg total) by mouth daily. 30 tablet 11 prn   empagliflozin (JARDIANCE) 10 MG TABS tablet Take 1 tablet (10 mg total) by mouth daily before breakfast. 30 tablet 6 Past Month   hydrOXYzine (ATARAX) 25 MG tablet TAKE 1 TABLET(25 MG) BY MOUTH THREE TIMES DAILY AS NEEDED 90 tablet 2 prn   Magnesium 400 MG CAPS Take by mouth. Take 6 tablets daily   02/01/2022   Potassium Gluconate 595 MG CAPS Take by mouth. Take 4 capsules daily   02/01/2022   rosuvastatin (CRESTOR) 20 MG tablet Take 1 tablet (20 mg total) by mouth daily. 90 tablet 3 02/01/2022   triamcinolone cream (KENALOG) 0.1 % Apply 1 application. topically 2 (two) times daily. 80 g 1 prn   clopidogrel (PLAVIX) 75 MG tablet TAKE 1 TABLET(75 MG) BY MOUTH DAILY 90 tablet 1    esomeprazole (NEXIUM) 40 MG capsule TAKE ONE CAPSULE BY MOUTH TWICE DAILY 180 capsule 2    furosemide (LASIX) 40 MG tablet TAKE 1 TABLET(40 MG) BY MOUTH DAILY AS NEEDED 30 tablet 2  lisinopril (ZESTRIL) 2.5 MG tablet TAKE 1 TABLET(2.5 MG) BY MOUTH DAILY 90 tablet 0    meloxicam (MOBIC) 15 MG tablet TAKE 1 TABLET(15 MG) BY MOUTH DAILY 90 tablet 3    traZODone (DESYREL) 50 MG tablet Take 0.5 tablets (25 mg total) by mouth at bedtime. (Patient not taking: Reported on 02/02/2022) 60 tablet 1 Not Taking   Scheduled:   ALPRAZolam  0.5 mg Oral TID   baclofen  10 mg Oral TID   dextromethorphan-guaiFENesin  1 tablet Oral BID   enoxaparin (LOVENOX) injection  40 mg Subcutaneous Q24H   lisinopril  2.5 mg Oral Daily   meloxicam  15 mg Oral Daily   pantoprazole  40 mg Oral Daily   potassium chloride  40 mEq Oral Once   predniSONE  40 mg Oral Q breakfast   rosuvastatin  20 mg Oral QHS    traZODone  25 mg Oral QHS   Infusions:  PRN: acetaminophen **OR** acetaminophen, albuterol, hydrOXYzine, ondansetron **OR** ondansetron (ZOFRAN) IV, sodium chloride Anti-infectives (From admission, onward)    None       Assessment: 67 year old female with a PMH of CAD, HTN, PAH, CHF, COPD. Pharmacy consulted to start heparin for a PE. No DOAC PTA. Chest CT: There are tiny filling defects in few subsegmental branches in right upper lobe and right lower lobe. Findings suggest pulmonary embolism with minimal thrombus burden. CBC is stable. Baseline labs ordered. Last dose enoxaparin 40 mg daily 1/10 @ 21:59.   Goal of Therapy:  Heparin level 0.3-0.7 units/ml Monitor platelets by anticoagulation protocol: Yes   Plan:  Give 4500 units bolus x 1 Start heparin infusion at 1350 units/hr Check anti-Xa level in 6 hours and daily while on heparin Continue to monitor H&H and platelets  Oswald Hillock, PharmD, BCPS 02/06/2022,12:36 PM

## 2022-02-06 NOTE — TOC Initial Note (Signed)
Transition of Care [facility]) - Initial/Assessment Note    Patient Details  Name: Brandi Steele MRN: [id number] Date of Birth: 10/12/1955  [facility] CM/SW Contact:    Tiburcio Bash, LCSW Phone Number: 02/06/2022, 2:48 PM  Clinical Narrative:                  CSW met with patient at bedside to discuss home health PT recommendations, patient is in agreement with no preference of agency. Patient confirms she continue to see Jon Billings NP as PCP. She reports not having a RW and being agreeable to one be ordered to her hospital room. Patient is active with [employer] for her cpap and home O2 needs. Patient reports she lives with her ex and has her daughter and several grandchildren that come check on them in the home.   Patient declines 3in1 at this time, reports her bathroom is close to her bedroom she does not need one.   CSW will order RW closer to dc time frame to be delivered to hospital room. Pending HH PT accepting agency at this time.   Expected Discharge Plan: [place] Barriers to Discharge: Continued Medical Work up   Patient Goals and CMS Choice Patient states their goals for this hospitalization and ongoing recovery are:: to go home CMS Medicare.gov Compare Post Acute Care list provided to:: Patient Choice offered to / list presented to : Patient      Expected Discharge Plan and Services       Living arrangements for the past 2 months: Single Family Home                                      Prior Living Arrangements/Services Living arrangements for the past 2 months: Single Family Home Lives with:: Spouse                   Activities of Daily Living Home Assistive Devices/Equipment: Oxygen, Eyeglasses ADL Screening (condition at time of admission) Patient's cognitive ability adequate to safely complete daily activities?: Yes Is the patient deaf or have difficulty hearing?: No Does the patient have difficulty  seeing, even when wearing glasses/contacts?: No Does the patient have difficulty concentrating, remembering, or making decisions?: No Patient able to express need for assistance with ADLs?: Yes Does the patient have difficulty dressing or bathing?: No Independently performs ADLs?: Yes (appropriate for developmental age) Does the patient have difficulty walking or climbing stairs?: No Weakness of Legs: None Weakness of Arms/Hands: None  Permission Sought/Granted                  Emotional Assessment              Admission diagnosis:  COPD exacerbation ([place]) [J44.1] Acute exacerbation of CHF (congestive heart failure) ([place]) [I50.9] COPD with acute exacerbation ([place]) [J44.1] Acute on chronic respiratory failure with hypoxia ([place]) [J96.21] Acute on chronic congestive heart failure, unspecified heart failure type ([place]) [I50.9] Patient Active Problem List   Diagnosis Date Noted   Acute pulmonary embolism ([place]) 02/06/2022   Acute on chronic diastolic CHF (congestive heart failure) ([place]) 02/02/2022   COPD with acute exacerbation ([place]) 02/02/2022   Acute on chronic respiratory failure with hypoxia ([place]) 02/02/2022   Pulmonary hypertension ([place])    Acute low back pain 06/17/2021   Right lower quadrant abdominal pain 04/01/2021   Caregiver role strain 12/05/2020  IFG (impaired fasting glucose) 09/07/2019   Carotid stenosis 12/02/2018   Abdominal aortic aneurysm (AAA) without rupture ([place]) 11/22/2018   Aortic atherosclerosis ([place]) 02/16/2017   Panic disorder 11/28/2016   Hypomagnesemia 05/02/2016   Sleep apnea 03/17/2016   CAD (coronary artery disease) 02/18/2016   Personal history of tobacco use, presenting hazards to health 01/19/2016   Essential hypertension 05/28/2015   Obesity 01/05/2015   Centrilobular emphysema ([place]) 01/05/2015   Fibromyalgia 01/05/2015   Hyperlipidemia 01/05/2015   GERD (gastroesophageal reflux disease) 01/05/2015   Sciatica 01/05/2015   CHF  (congestive heart failure) ([place]) 01/05/2015   Insomnia 01/05/2015   PCP:  Jon Billings, NP Pharmacy:   [facility] [phone] [address], [address] [place] [address] Phone: [phone] Fax: [phone]     Social Determinants of Health (SDOH) Social History: SDOH Screenings   Food Insecurity: No Food Insecurity (02/03/2022)  Housing: Low Risk  (02/03/2022)  Transportation Needs: No Transportation Needs (02/03/2022)  Utilities: Not At Risk (02/03/2022)  Alcohol Screen: Low Risk  (10/21/2021)  Depression (PHQ2-9): Low Risk  (11/26/2021)  Financial Resource Strain: Low Risk  (10/21/2021)  Physical Activity: Insufficiently Active (10/21/2021)  Social Connections: Moderately Integrated (10/21/2021)  Stress: Stress Concern Present (10/21/2021)  Tobacco Use: Medium Risk (02/03/2022)   SDOH Interventions:     Readmission Risk Interventions     No data to display

## 2022-02-06 NOTE — Progress Notes (Signed)
*  PRELIMINARY RESULTS* Echocardiogram 2D Echocardiogram has been performed.  Brandi Steele 02/06/2022, 2:33 PM

## 2022-02-06 NOTE — Progress Notes (Addendum)
PROGRESS NOTE  Brandi Steele QJF:[id number] DOB: August 12, 1955 DOA: 02/02/2022 PCP: Jon Billings, NP  Hospital Course/Subjective: Brandi Steele is a 67 y.o. female with medical history significant for CAD , HTN, HL, former smoker,pulmonary hypertension, diastolic CHF, COPD on home O2 at 2 L, OSA on CPAP, PAD s/p AAA repair and chronic diastolic HF who presents to the ED via EMS with wheezing and shortness of breath associated with chest pain.  She was diagnosed with acute on chronic diastolic congestive heart failure, COPD exacerbation, and admitted to the hospitalist service.  This morning, she remains hypoxic with ambulation but saturating well at rest.  She feels she is breathing "okay" but not as well as she does at home. She feels winded with even minimal movement around her room in the ER. Denies any fevers, chills, or chest pain.  Assessment/Plan:  Principal Problem:   Acute on chronic diastolic CHF (congestive heart failure) (HCC) Active Problems:   Acute on chronic respiratory failure with hypoxia (HCC)   COPD with acute exacerbation (HCC)   Pulmonary hypertension (HCC)   Sleep apnea   Fibromyalgia   Essential hypertension   CAD (coronary artery disease)   Acute pulmonary embolism (HCC)   Assessment and Plan: * Acute exacerbation of CHF (congestive heart failure) (HCC) BNP minimally elevated at 100 but chest x-ray showing diffuse interstitial opacities suggesting edema, she is diuresing though I/O have not been recorded. Echo 07/2021 EF 60- 65%, Stop IV Lasix - volume status improved Continue home lisinopril Monitor renal function and electrolytes Daily weights and strict I/O's Heart healthy diet  Acute pulmonary embolism -- Initially lower suspicion for VTE, but on 1/11, obtained further evaluation given increased oxygen requirement from 2 >> 3 >> 4 L/min.  D-dimer was elevated.  CTA chest showed multiple tiny filling defects consistent with pulmonary emboli.  Lower  extremity duplex U/S negative for DVT's bilaterally. --Start IV heparin --Hold off Plavix, will discuss with cardiology --Echo pending --Monitor HR, O2 needs, hemodynamics --Eliquis at discharge  Acute on chronic respiratory failure with hypoxia ([place]) Suspect multifactorial: Due to acute PE, COPD, and ?CHF, moderate pulmonary hypertension and OSA, ex smoker At baseline she wears 2 L and at night with CPAP with 3 L bleeding Supplemental O2 to keep sats above 90 Treat underlying issues as outlined Check ambulatory o2 sats 24 hours prior to discharge Wean O2 as tolerated to baseline 2 L/min  COPD with acute exacerbation (HCC) Scheduled and as needed nebulized bronchodilator treatment Was given IV steroids, now on oral prednisone  Antitussives, flutter valve, supportive care Mucinex added  Pulmonary hypertension ([place]) Right heart cath 09/2021  "Pulmonary hypertension is moderate and seems to be of a mixed arterial and venous etiology "  Sleep apnea OSA on CPAP: With 3 L bleed in.  Followed by pulmonologist, Dr. Silas Flood  CAD (coronary artery disease) Patient with chest pain with increased work of breathing but negative troponin and EKG is nonacute Suspect atypical chest pain secondary to work of breathing, this has resolved. Continue Plavix, rosuvastatin and lisinopril Follows with [facility] cardiology  Essential hypertension Controlled.  Continue lisinopril  Fibromyalgia Continue baclofen, trazodone  DVT Prophylaxis: Lovenox   Code Status: Full code   Family Communication: Daughter updated by phone this afternoon 1/11.   Disposition Plan: Home when medically stable likely in next 24-48 hours.  Consultants: None  Procedures: None  Antimicrobials: Anti-infectives (From admission, onward)    None      Objective: Vitals:   02/05/22  2333 02/06/22 0319 02/06/22 0800 02/06/22 0853  BP: (!) 98/50 116/79 120/84   Pulse: 69 (!) 57 70   Resp: '18 16 20   '$ Temp: 97.7 F  (36.5 C) 98 F (36.7 C) 98.2 F (36.8 C)   TempSrc: Oral Oral Oral   SpO2: 95% 99% 98% 98%  Weight:      Height:        Intake/Output Summary (Last 24 hours) at 02/06/2022 1428 Last data filed at 02/05/2022 1455 Gross per 24 hour  Intake --  Output 200 ml  Net -200 ml   Filed Weights   02/02/22 1344 02/05/22 0856  Weight: 85.7 kg 83.6 kg   Exam:  General exam: awake, alert, no acute distress, ill-appearing HEENT: moist mucus membranes, hearing grossly normal  Respiratory system: improved aeration, faint expiratory wheezes, no rhonchi, normal respiratory effort at rest. On 4 L/min [place] O2 Cardiovascular system: normal S1/S2, RRR, no pedal edema.   Gastrointestinal system: soft, NT, ND, no HSM felt, +bowel sounds. Central nervous system: A&O x3. no gross focal neurologic deficits, normal speech Extremities: moves al, no edema, normal tone Skin: dry, intact, normal temperature, normal color, No rashes, lesions or ulcers Psychiatry: normal mood, congruent affect, judgement and insight appear normal   Data Reviewed: CBC: Recent Labs  Lab 02/02/22 1358 02/04/22 0505 02/05/22 0441 02/06/22 0341  WBC 6.4 10.9* 8.9 9.3  HGB 13.4 12.7 12.5 13.0  HCT 42.7 39.2 39.8 40.6  MCV 91.2 90.3 92.8 90.4  PLT 125* 155 152 073   Basic Metabolic Panel: Recent Labs  Lab 02/02/22 1358 02/03/22 0811 02/04/22 0505 02/05/22 0441 02/06/22 0341  NA 139 138 139 141 141  K 3.8 3.7 3.8 3.9 3.6  CL 103 100 102 101 99  CO2 '26 27 29 31 31  '$ GLUCOSE 126* 166* 123* 109* 101*  BUN 22 29* 39* 40* 36*  CREATININE 0.95 0.95 1.09* 1.03* 0.97  CALCIUM 8.7* 9.2 9.0 8.7* 8.6*  MG 1.9  --   --   --  2.3   GFR: Estimated Creatinine Clearance: 59.2 mL/min (by C-G formula based on SCr of 0.97 mg/dL). Liver Function Tests: No results for input(s): "AST", "ALT", "ALKPHOS", "BILITOT", "PROT", "ALBUMIN" in the last 168 hours. No results for input(s): "LIPASE", "AMYLASE" in the last 168 hours. No results  for input(s): "AMMONIA" in the last 168 hours. Coagulation Profile: Recent Labs  Lab 02/06/22 1305  INR 1.0   Cardiac Enzymes: No results for input(s): "CKTOTAL", "CKMB", "CKMBINDEX", "TROPONINI" in the last 168 hours. BNP (last 3 results) No results for input(s): "PROBNP" in the last 8760 hours. HbA1C: No results for input(s): "HGBA1C" in the last 72 hours. CBG: No results for input(s): "GLUCAP" in the last 168 hours. Lipid Profile: No results for input(s): "CHOL", "HDL", "LDLCALC", "TRIG", "CHOLHDL", "LDLDIRECT" in the last 72 hours. Thyroid Function Tests: No results for input(s): "TSH", "T4TOTAL", "FREET4", "T3FREE", "THYROIDAB" in the last 72 hours. Anemia Panel: No results for input(s): "VITAMINB12", "FOLATE", "FERRITIN", "TIBC", "IRON", "RETICCTPCT" in the last 72 hours. Urine analysis:    Component Value Date/Time   COLORURINE Yellow 06/16/2013 1646   APPEARANCEUR Clear 06/17/2021 1515   LABSPEC 1.017 06/16/2013 1646   PHURINE 7.0 06/16/2013 1646   GLUCOSEU Negative 06/17/2021 1515   GLUCOSEU Negative 06/16/2013 1646   HGBUR 1+ 06/16/2013 1646   BILIRUBINUR Negative 06/17/2021 1515   BILIRUBINUR Negative 06/16/2013 1646   KETONESUR Negative 06/16/2013 1646   PROTEINUR Negative 06/17/2021 1515   PROTEINUR Negative  06/16/2013 1646   NITRITE Negative 06/17/2021 1515   NITRITE Negative 06/16/2013 1646   LEUKOCYTESUR Trace (A) 06/17/2021 1515   LEUKOCYTESUR Negative 06/16/2013 1646   Sepsis Labs: '@LABRCNTIP'$ (procalcitonin:4,lacticidven:4)  ) Recent Results (from the past 240 hour(s))  Resp panel by RT-PCR (RSV, Flu A&B, Covid) Anterior Nasal Swab     Status: None   Collection Time: 02/02/22  8:19 PM   Specimen: Anterior Nasal Swab  Result Value Ref Range Status   SARS Coronavirus 2 by RT PCR NEGATIVE NEGATIVE Final    Comment: (NOTE) SARS-CoV-2 target nucleic acids are NOT DETECTED.  The SARS-CoV-2 RNA is generally detectable in upper respiratory specimens  during the acute phase of infection. The lowest concentration of SARS-CoV-2 viral copies this assay can detect is 138 copies/mL. A negative result does not preclude SARS-Cov-2 infection and should not be used as the sole basis for treatment or other patient management decisions. A negative result may occur with  improper specimen collection/handling, submission of specimen other than nasopharyngeal swab, presence of viral mutation(s) within the areas targeted by this assay, and inadequate number of viral copies(<138 copies/mL). A negative result must be combined with clinical observations, patient history, and epidemiological information. The expected result is Negative.  Fact Sheet for Patients:  EntrepreneurPulse.com.au  Fact Sheet for Healthcare Providers:  IncredibleEmployment.be  This test is no t yet approved or cleared by the [place] FDA and  has been authorized for detection and/or diagnosis of SARS-CoV-2 by FDA under an Emergency Use Authorization (EUA). This EUA will remain  in effect (meaning this test can be used) for the duration of the COVID-19 declaration under Section 564(b)(1) of the Act, 21 U.S.C.section 360bbb-3(b)(1), unless the authorization is terminated  or revoked sooner.       Influenza A by PCR NEGATIVE NEGATIVE Final   Influenza B by PCR NEGATIVE NEGATIVE Final    Comment: (NOTE) The Xpert Xpress SARS-CoV-2/FLU/RSV plus assay is intended as an aid in the diagnosis of influenza from Nasopharyngeal swab specimens and should not be used as a sole basis for treatment. Nasal washings and aspirates are unacceptable for Xpert Xpress SARS-CoV-2/FLU/RSV testing.  Fact Sheet for Patients: EntrepreneurPulse.com.au  Fact Sheet for Healthcare Providers: IncredibleEmployment.be  This test is not yet approved or cleared by the [place] FDA and has been authorized for detection  and/or diagnosis of SARS-CoV-2 by FDA under an Emergency Use Authorization (EUA). This EUA will remain in effect (meaning this test can be used) for the duration of the COVID-19 declaration under Section 564(b)(1) of the Act, 21 U.S.C. section 360bbb-3(b)(1), unless the authorization is terminated or revoked.     Resp Syncytial Virus by PCR NEGATIVE NEGATIVE Final    Comment: (NOTE) Fact Sheet for Patients: EntrepreneurPulse.com.au  Fact Sheet for Healthcare Providers: IncredibleEmployment.be  This test is not yet approved or cleared by the [place] FDA and has been authorized for detection and/or diagnosis of SARS-CoV-2 by FDA under an Emergency Use Authorization (EUA). This EUA will remain in effect (meaning this test can be used) for the duration of the COVID-19 declaration under Section 564(b)(1) of the Act, 21 U.S.C. section 360bbb-3(b)(1), unless the authorization is terminated or revoked.  Performed at [facility], [address] [place], [place] [address]      Studies: US Venous Img Lower Bilateral (DVT)  Result Date: 02/06/2022 CLINICAL DATA:  Positive D-dimer EXAM: BILATERAL LOWER EXTREMITY VENOUS DOPPLER ULTRASOUND TECHNIQUE: Gray-scale sonography with graded compression, as well as color Doppler and  duplex ultrasound were performed to evaluate the lower extremity deep venous systems from the level of the common femoral vein and including the common femoral, femoral, profunda femoral, popliteal and calf veins including the posterior tibial, peroneal and gastrocnemius veins when visible. The superficial great saphenous vein was also interrogated. Spectral Doppler was utilized to evaluate flow at rest and with distal augmentation maneuvers in the common femoral, femoral and popliteal veins. COMPARISON:  None Available. FINDINGS: RIGHT LOWER EXTREMITY Common Femoral Vein: No evidence of thrombus. Normal compressibility,  respiratory phasicity and response to augmentation. Saphenofemoral Junction: No evidence of thrombus. Normal compressibility and flow on color Doppler imaging. Profunda Femoral Vein: No evidence of thrombus. Normal compressibility and flow on color Doppler imaging. Femoral Vein: No evidence of thrombus. Normal compressibility, respiratory phasicity and response to augmentation. Popliteal Vein: No evidence of thrombus. Normal compressibility, respiratory phasicity and response to augmentation. Calf Veins: No evidence of thrombus. Normal compressibility and flow on color Doppler imaging. Superficial Great Saphenous Vein: No evidence of thrombus. Normal compressibility. Venous Reflux:  None. Other Findings:  None. LEFT LOWER EXTREMITY Common Femoral Vein: No evidence of thrombus. Normal compressibility, respiratory phasicity and response to augmentation. Saphenofemoral Junction: No evidence of thrombus. Normal compressibility and flow on color Doppler imaging. Profunda Femoral Vein: No evidence of thrombus. Normal compressibility and flow on color Doppler imaging. Femoral Vein: No evidence of thrombus. Normal compressibility, respiratory phasicity and response to augmentation. Popliteal Vein: No evidence of thrombus. Normal compressibility, respiratory phasicity and response to augmentation. Calf Veins: No evidence of thrombus. Normal compressibility and flow on color Doppler imaging. Superficial Great Saphenous Vein: No evidence of thrombus. Normal compressibility. Venous Reflux:  None. Other Findings:  None. IMPRESSION: No evidence of deep venous thrombosis in either lower extremity. Electronically Signed   By: Elmer Picker M.D.   On: 02/06/2022 11:44   CT Angio Chest Pulmonary Embolism (PE) W or WO Contrast  Result Date: 02/06/2022 CLINICAL DATA:  Positive D-dimer, clinical suspicion for PE EXAM: CT ANGIOGRAPHY CHEST WITH CONTRAST TECHNIQUE: Multidetector CT imaging of the chest was performed using the  standard protocol during bolus administration of intravenous contrast. Multiplanar CT image reconstructions and MIPs were obtained to evaluate the vascular anatomy. RADIATION DOSE REDUCTION: This exam was performed according to the departmental dose-optimization program which includes automated exposure control, adjustment of the mA and/or kV according to patient size and/or use of iterative reconstruction technique. CONTRAST:  74m OMNIPAQUE IOHEXOL 350 MG/ML SOLN COMPARISON:  06/27/2021 FINDINGS: Cardiovascular: In image 96 of series 4, there is small central filling defect in is subsegmental branch in right lower lobe. In image 102 of series 7, there is possible tiny central luminal filling defect in subsegmental branch in right upper lobe. No other definite intraluminal filling defects are seen in pulmonary artery branches. There is ectasia of main pulmonary artery measuring 3.7 cm in diameter. Contrast density in thoracic aorta is less than optimal. As far as seen, there is no demonstrable intimal flap. There are scattered calcifications seen thoracic aorta. There are few scattered coronary artery calcifications. Mediastinum/Nodes: Slightly enlarged lymph nodes in mediastinum and hilar regions have not changed significantly. Lungs/Pleura: There is no focal pulmonary consolidation. There is mild peribronchial thickening. In image 53 of series 6, there is 7 mm area of increased interstitial markings in posterior segment of right upper lobe. In image 28 of series 6, there is 9 mm pleural-based density in the posterior left upper lobe. These findings were not seen in the previous  examination. Linear densities are seen in both lower lung fields. Upper Abdomen: No acute findings are seen. Musculoskeletal: Slight decrease in height of few thoracic vertebral bodies has not changed. Review of the MIP images confirms the above findings. IMPRESSION: There are tiny filling defects in few subsegmental branches in right  upper lobe and right lower lobe. Findings suggest pulmonary embolism with minimal thrombus burden. Aortic atherosclerosis. Scattered coronary artery calcifications are seen. There is ectasia of main pulmonary artery suggesting pulmonary arterial hypertension. There is no focal pulmonary consolidation. There is no pleural effusion. There are small nodular densities in both upper lobes. Follow-up CT chest in 3 months may be considered. These results will be called to the ordering clinician or representative by the Radiologist Assistant, and communication documented in the PACS or [employer]. Electronically Signed   By: Elmer Picker M.D.   On: 02/06/2022 11:43    Scheduled Meds:  ALPRAZolam  0.5 mg Oral TID   baclofen  10 mg Oral TID   dextromethorphan-guaiFENesin  1 tablet Oral BID   lisinopril  2.5 mg Oral Daily   meloxicam  15 mg Oral Daily   pantoprazole  40 mg Oral Daily   predniSONE  40 mg Oral Q breakfast   rosuvastatin  20 mg Oral QHS   traZODone  25 mg Oral QHS    Continuous Infusions:  heparin 1,350 Units/hr (02/06/22 1319)     LOS: 2 days   Time spent: 45 minutes  Ezekiel Slocumb, DO [facility] Pager [phone]  If 7PM-7AM, please contact night-coverage [email or web address] Password [facility] 02/06/2022, 2:28 PM

## 2022-02-06 NOTE — Consult Note (Signed)
ANTICOAGULATION CONSULT NOTE - Initial Consult  Pharmacy Consult for Heparin infusion Indication: pulmonary embolus  Allergies  Allergen Reactions   Morphine And Related Nausea And Vomiting   Celexa [Citalopram Hydrobromide] Other (See Comments)    Pruritis    Effexor [Venlafaxine] Other (See Comments)    Panic attack   Lipitor [Atorvastatin] Other (See Comments)    Myalgias   Shellfish Allergy Hives and Swelling   Wellbutrin [Bupropion] Anxiety    Patient Measurements: Height: '5\' 5"'$  (165.1 cm) Weight: 83.6 kg (184 lb 4.5 oz) IBW/kg (Calculated) : 57 Heparin Dosing Weight: 75.6 kg  Vital Signs: Temp: 98.5 F (36.9 C) (01/11 1535) Temp Source: Oral (01/11 1535) BP: 122/85 (01/11 1535) Pulse Rate: 75 (01/11 1535)  Labs: Recent Labs    02/04/22 0505 02/05/22 0441 02/06/22 0341 02/06/22 1305 02/06/22 2016  HGB 12.7 12.5 13.0  --   --   HCT 39.2 39.8 40.6  --   --   PLT 155 152 159  --   --   APTT  --   --   --  27  --   LABPROT  --   --   --  13.2  --   INR  --   --   --  1.0  --   HEPARINUNFRC  --   --   --   --  0.82*  CREATININE 1.09* 1.03* 0.97  --   --      Estimated Creatinine Clearance: 59.2 mL/min (by C-G formula based on SCr of 0.97 mg/dL).   Medical History: PTA: Plavix '75mg'$  PO QD Inpatient: Heparin infusion 1/11 >  Allergies; NO AC/APT related allergies   Assessment: 67 year old female with a PMH of CAD, HTN, PAH, CHF, COPD. Pharmacy consulted to start heparin for a PE. No DOAC PTA. Chest CT: There are tiny filling defects in few subsegmental branches in right upper lobe and right lower lobe. Findings suggest pulmonary embolism with minimal thrombus burden. CBC is stable. Baseline labs ordered. Last dose enoxaparin 40 mg daily 1/10 @ 21:59.   Date Time HL Rate/Comment 1/11 2016 0.82 SUPRAtherapeutic/ 1350 > 1200 u/hr  Goal of Therapy:  Heparin level 0.3-0.7 units/ml Monitor platelets by anticoagulation protocol: Yes   Plan:  Decrease  heparin infusion to 1200 units/hr Check anti-Xa level in 8 hours and daily once consecutively therapeutic. Continue to monitor H&H and platelets daily while on heparin gtt.  Darrick Penna, PharmD, BCPS 02/06/2022,9:13 PM

## 2022-02-06 NOTE — TOC Benefit Eligibility Note (Signed)
Patient Teacher, English as a foreign language completed.    The patient is currently admitted and upon discharge could be taking Eliquis Starter Pack.  The current 30 day co-pay is $0.00.   The patient is currently admitted and upon discharge could be taking Xarelto Starter Pack.  The current 30 day co-pay is $0.00.   The patient is insured through [place], [place] Patient Advocate Specialist [place] Patient Advocate Team Direct Number: [phone]  Fax: [phone]

## 2022-02-06 NOTE — Assessment & Plan Note (Signed)
Due to progressive hypoxia, obtained D dimer today that was elevated.  CTA chest notable for multiple tiny filling defects in RUL and RLL with minimal thrombus burden.  Lower extremity duplex U/S negative for DVT's bilaterally. --Start IV heparin --Will get Echo

## 2022-02-06 NOTE — Progress Notes (Signed)
Physical Therapy Treatment Patient Details Name: Brandi Steele MRN: [id number] DOB: Aug 23, 1955 Today's Date: 02/06/2022   History of Present Illness Brandi Steele is a 67 y.o. female with medical history significant for CAD , HTN, HL, former smoker,pulmonary hypertension, diastolic CHF, COPD on home O2 at 2 L, OSA on CPAP, PAD s/p AAA repair and chronic diastolic HF who presents to the ED via EMS with wheezing and shortness of breath associated with chest pain.  She also reported 10 pound weight gain over the past week.  She has been using her breathing treatments and her Lasix without significant relief.  She denies fever or chills and denies lower extremity pain.  O2 sat with EMS was 82% but measured on room air.  She received Solu-Medrol and 2 breathing treatments in route with improvement in O2 sat to 100% on home flow rate of 2 L by arrival. Acute PE.    PT Comments    Patient received in bed. She is agreeable to PT session. States she walked a short distance earlier. Patient is mod I with bed mobility. Transfers with min guard. She is able to ambulate 125 feet with RW and min guard. O2 sats down to 88% on 2 liters with mobility. She will continue to benefit from skilled PT to improve endurance, strength and safety.     Recommendations for follow up therapy are one component of a multi-disciplinary discharge planning process, led by the attending physician.  Recommendations may be updated based on patient status, additional functional criteria and insurance authorization.  Follow Up Recommendations  Home health PT     Assistance Recommended at Discharge Frequent or constant Supervision/Assistance  Patient can return home with the following A little help with walking and/or transfers;A little help with bathing/dressing/bathroom;Help with stairs or ramp for entrance;Assist for transportation;Assistance with cooking/housework   Equipment Recommendations  Rolling walker (2 wheels)     Recommendations for Other Services       Precautions / Restrictions Precautions Precautions: Fall Restrictions Weight Bearing Restrictions: No     Mobility  Bed Mobility Overal bed mobility: Modified Independent Bed Mobility: Supine to Sit, Sit to Supine     Supine to sit: Modified independent (Device/Increase time) Sit to supine: Modified independent (Device/Increase time)        Transfers Overall transfer level: Needs assistance Equipment used: Rolling walker (2 wheels) Transfers: Sit to/from Stand Sit to Stand: Min guard                Ambulation/Gait Ambulation/Gait assistance: Min guard Gait Distance (Feet): 125 Feet Assistive device: Rolling walker (2 wheels) Gait Pattern/deviations: Step-through pattern, Decreased step length - right, Decreased step length - left Gait velocity: decr     General Gait Details: patient ambulating better this session with RW. Continues to have low tolerance and o2 sats down to 88 on 2 liters with ambulation   Stairs             Wheelchair Mobility    Modified Rankin (Stroke Patients Only)       Balance Overall balance assessment: Needs assistance Sitting-balance support: Feet supported Sitting balance-Leahy Scale: Fair     Standing balance support: Bilateral upper extremity supported, During functional activity, Reliant on assistive device for balance Standing balance-Leahy Scale: Fair                              Cognition Arousal/Alertness: Awake/alert Behavior During Therapy: WFL for  tasks assessed/performed Overall Cognitive Status: Within Functional Limits for tasks assessed                                          Exercises      General Comments        Pertinent Vitals/Pain Pain Assessment Pain Assessment: No/denies pain    Home Living                          Prior Function            PT Goals (current goals can now be found in the care  plan section) Acute Rehab PT Goals Patient Stated Goal: to get home PT Goal Formulation: With patient/family Time For Goal Achievement: 02/19/22 Potential to Achieve Goals: Fair Progress towards PT goals: Progressing toward goals    Frequency    Min 2X/week      PT Plan Current plan remains appropriate    Co-evaluation              AM-PAC PT "6 Clicks" Mobility   Outcome Measure  Help needed turning from your back to your side while in a flat bed without using bedrails?: A Little Help needed moving from lying on your back to sitting on the side of a flat bed without using bedrails?: A Little Help needed moving to and from a bed to a chair (including a wheelchair)?: A Little Help needed standing up from a chair using your arms (e.g., wheelchair or bedside chair)?: A Little Help needed to walk in hospital room?: A Little Help needed climbing 3-5 steps with a railing? : A Lot 6 Click Score: 17    End of Session Equipment Utilized During Treatment: Gait belt;Oxygen Activity Tolerance: Patient limited by fatigue;Patient limited by lethargy Patient left: in bed;with call bell/phone within reach;with bed alarm set Nurse Communication: Mobility status PT Visit Diagnosis: Unsteadiness on feet (R26.81);Muscle weakness (generalized) (M62.81);Other abnormalities of gait and mobility (R26.89);Difficulty in walking, not elsewhere classified (R26.2)     Time: 1610-9604 PT Time Calculation (min) (ACUTE ONLY): 16 min  Charges:  $Gait Training: 8-22 mins                     Caesar Mannella, PT, GCS 02/06/22,3:39 PM

## 2022-02-06 NOTE — Progress Notes (Signed)
   Heart Failure Nurse Navigator Note  Met with patient today in follow-up with her meeting with the dietitian.  Discussed reading labels, sticking with a 2000 mg sodium restriction.  And 64 ounce fluid restriction.  Reinforced the importance of daily weights and reporting 2 pound weight gain overnight or total of 5 pounds within the week.  Also to report changes in symptoms such as increasing shortness of breath, PND, orthopnea, lower extremity edema etc.  She had no further questions at this time.  Pricilla Riffle RN [facility]

## 2022-02-07 ENCOUNTER — Encounter: Payer: 59 | Admitting: Family

## 2022-02-07 ENCOUNTER — Ambulatory Visit: Payer: Medicare Other | Admitting: Dietician

## 2022-02-07 DIAGNOSIS — I5033 Acute on chronic diastolic (congestive) heart failure: Secondary | ICD-10-CM | POA: Diagnosis not present

## 2022-02-07 LAB — BASIC METABOLIC PANEL
Anion gap: 10 (ref 5–15)
BUN: 40 mg/dL — ABNORMAL HIGH (ref 8–23)
CO2: 27 mmol/L (ref 22–32)
Calcium: 8.4 mg/dL — ABNORMAL LOW (ref 8.9–10.3)
Chloride: 99 mmol/L (ref 98–111)
Creatinine, Ser: 0.99 mg/dL (ref 0.44–1.00)
GFR, Estimated: >60 mL/min (ref >60)
Glucose, Bld: 164 mg/dL — ABNORMAL HIGH (ref 70–99)
Potassium: 4.1 mmol/L (ref 3.5–5.1)
Sodium: 136 mmol/L (ref 135–145)

## 2022-02-07 LAB — CBC
HCT: 41 % (ref 36.0–46.0)
Hemoglobin: 13.2 g/dL (ref 12.0–15.0)
MCH: 29 pg (ref 26.0–34.0)
MCHC: 32.2 g/dL (ref 30.0–36.0)
MCV: 90.1 fL (ref 80.0–100.0)
Platelets: 178 10*3/uL (ref 150–400)
RBC: 4.55 MIL/uL (ref 3.87–5.11)
RDW: 13.3 % (ref 11.5–15.5)
WBC: 10.4 10*3/uL (ref 4.0–10.5)
nRBC: 0 % (ref 0.0–0.2)

## 2022-02-07 LAB — HEPARIN LEVEL (UNFRACTIONATED): Heparin Unfractionated: 1.06 IU/mL — ABNORMAL HIGH (ref 0.30–0.70)

## 2022-02-07 MED ORDER — APIXABAN 5 MG PO TABS: 5.0000 mg | ORAL_TABLET | Freq: Two times a day (BID) | ORAL | Status: DC

## 2022-02-07 MED ORDER — PREDNISONE 20 MG PO TABS: 20.0000 mg | ORAL_TABLET | Freq: Every day | ORAL | Status: DC

## 2022-02-07 MED ORDER — APIXABAN (ELIQUIS) VTE STARTER PACK (10MG AND 5MG): ORAL_TABLET | ORAL | 0 refills | Status: DC

## 2022-02-07 MED ORDER — ALPRAZOLAM 0.5 MG PO TABS: 0.5000 mg | ORAL_TABLET | Freq: Three times a day (TID) | ORAL | 0 refills | Status: DC | PRN

## 2022-02-07 MED ORDER — APIXABAN 5 MG PO TABS
10.0000 mg | ORAL_TABLET | Freq: Two times a day (BID) | ORAL | Status: DC
  Administered 2022-02-07: 10 mg via ORAL
  Filled 2022-02-07: qty 2

## 2022-02-07 MED ORDER — PREDNISONE 10 MG PO TABS: ORAL_TABLET | ORAL | 0 refills | Status: AC

## 2022-02-07 NOTE — Progress Notes (Signed)
Physical Therapy Treatment Patient Details Name: Brandi Steele MRN: [id number] DOB: [birth year] Today's Date: 02/07/2022   History of Present Illness Brandi Steele is a 67 y.o. female with medical history significant for CAD , HTN, HL, former smoker,pulmonary hypertension, diastolic CHF, COPD on home O2 at 2 L, OSA on CPAP, PAD s/p AAA repair and chronic diastolic HF who presents to the ED via EMS with wheezing and shortness of breath associated with chest pain.  She also reported 10 pound weight gain over the past week.  She has been using her breathing treatments and her Lasix without significant relief.  She denies fever or chills and denies lower extremity pain.  O2 sat with EMS was 82% but measured on room air.  She received Solu-Medrol and 2 breathing treatments in route with improvement in O2 sat to 100% on home flow rate of 2 L by arrival. Acute PE.    PT Comments    Patient received in bed, she wants to go home. Agrees to walk if it will help her get home. Patient is mod I with bed mobility, min guard for transfers and ambulation with RW 175 feet. She has no lob and O2 sats remained > 95% on 3 liters via [place]. Patient has chronic O2 at home. She will continue to benefit from skilled PT to improve endurance, functional independence and safety with mobility.      Recommendations for follow up therapy are one component of a multi-disciplinary discharge planning process, led by the attending physician.  Recommendations may be updated based on patient status, additional functional criteria and insurance authorization.  Follow Up Recommendations  Home health PT     Assistance Recommended at Discharge Frequent or constant Supervision/Assistance  Patient can return home with the following A little help with walking and/or transfers;A little help with bathing/dressing/bathroom;Help with stairs or ramp for entrance;Assist for transportation;Assistance with cooking/housework   Equipment  Recommendations  Rolling walker (2 wheels)    Recommendations for Other Services       Precautions / Restrictions Precautions Precautions: Fall Restrictions Weight Bearing Restrictions: No     Mobility  Bed Mobility Overal bed mobility: Modified Independent Bed Mobility: Supine to Sit, Sit to Supine     Supine to sit: Modified independent (Device/Increase time) Sit to supine: Modified independent (Device/Increase time)        Transfers Overall transfer level: Needs assistance Equipment used: Rolling walker (2 wheels) Transfers: Sit to/from Stand Sit to Stand: Min guard                Ambulation/Gait Ambulation/Gait assistance: Min guard Gait Distance (Feet): 155 Feet Assistive device: Rolling walker (2 wheels) Gait Pattern/deviations: Step-through pattern, Decreased step length - right, Decreased step length - left, Decreased stride length Gait velocity: decr     General Gait Details: Patient's O2 sats remained > 95% on 3 liters via [place]. No reported SOB. Improved activity tolerance.   Stairs             Wheelchair Mobility    Modified Rankin (Stroke Patients Only)       Balance Overall balance assessment: Needs assistance Sitting-balance support: Feet supported Sitting balance-Leahy Scale: Good     Standing balance support: Bilateral upper extremity supported, During functional activity, Reliant on assistive device for balance Standing balance-Leahy Scale: Fair Standing balance comment: reliant on RW  Cognition Arousal/Alertness: Awake/alert Behavior During Therapy: WFL for tasks assessed/performed Overall Cognitive Status: Within Functional Limits for tasks assessed                                          Exercises      General Comments        Pertinent Vitals/Pain Pain Assessment Pain Assessment: No/denies pain    Home Living                          Prior  Function            PT Goals (current goals can now be found in the care plan section) Acute Rehab PT Goals Patient Stated Goal: to get home PT Goal Formulation: With patient Time For Goal Achievement: 02/19/22 Potential to Achieve Goals: Good Progress towards PT goals: Progressing toward goals    Frequency    Min 2X/week      PT Plan Current plan remains appropriate    Co-evaluation              AM-PAC PT "6 Clicks" Mobility   Outcome Measure  Help needed turning from your back to your side while in a flat bed without using bedrails?: None Help needed moving from lying on your back to sitting on the side of a flat bed without using bedrails?: None Help needed moving to and from a bed to a chair (including a wheelchair)?: A Little Help needed standing up from a chair using your arms (e.g., wheelchair or bedside chair)?: A Little Help needed to walk in hospital room?: A Little Help needed climbing 3-5 steps with a railing? : A Lot 6 Click Score: 19    End of Session Equipment Utilized During Treatment: Gait belt;Oxygen Activity Tolerance: Patient tolerated treatment well Patient left: in bed;with call bell/phone within reach;with bed alarm set Nurse Communication: Mobility status PT Visit Diagnosis: Muscle weakness (generalized) (M62.81);Difficulty in walking, not elsewhere classified (R26.2)     Time: 9629-5284 PT Time Calculation (min) (ACUTE ONLY): 15 min  Charges:  $Gait Training: 8-22 mins                     Brandi Steele, PT, GCS 02/07/22,11:46 AM

## 2022-02-07 NOTE — Discharge Summary (Signed)
Physician Discharge Summary   Patient: Brandi Steele MRN: [id number] DOB: 1955/06/21  Admit date:     02/02/2022  Discharge date: 02/07/22  Discharge Physician: Ezekiel Slocumb   PCP: Jon Billings, NP   Recommendations at discharge:   Follow up with Primary Care Follow up with Pulmonology Follow up with Cardiology Pt started on Eliquis for acute Pe's.  Plavix has therefore been discontinued to reduce bleeding risk. Follow O2 requirements.  At time of discharge requiring 2-3 L/min Repeat CBC, BMP in 1-2 weeks  Discharge Diagnoses: Principal Problem:   Acute on chronic diastolic CHF (congestive heart failure) (HCC) Active Problems:   Acute on chronic respiratory failure with hypoxia (HCC)   COPD with acute exacerbation (HCC)   Pulmonary hypertension (HCC)   Sleep apnea   Fibromyalgia   Essential hypertension   CAD (coronary artery disease)   Acute pulmonary embolism ([place])  Resolved Problems:   * No resolved hospital problems. *  Hospital Course:  Brandi Steele is a 67 y.o. female with medical history significant for CAD , HTN, HL, former smoker,pulmonary hypertension, diastolic CHF, COPD on home O2 at 2 L, OSA on CPAP, PAD s/p AAA repair and chronic diastolic HF who presents to the ED via EMS with wheezing and shortness of breath associated with chest pain.  She was diagnosed with acute on chronic diastolic congestive heart failure, COPD exacerbation, and admitted to the hospitalist service.   Assessment and Plan:  * Acute exacerbation of CHF (congestive heart failure) (HCC) BNP minimally elevated at 100 but chest x-ray showing diffuse interstitial opacities suggesting edema, she is diuresing though I/O have not been recorded. Echo 07/2021 EF 60- 65%, Stopped IV Lasix - volume status improved At discharge - resume home Lasix AS NEEDED Continue home lisinopril Monitor renal function and electrolytes Daily weights and strict I/O's Heart healthy diet   Acute  pulmonary embolism -- Initially lower suspicion for VTE, but on 1/11, obtained further evaluation given increased oxygen requirement from 2 >> 3 >> 4 L/min.  D-dimer was elevated.  CTA chest showed multiple tiny filling defects consistent with pulmonary emboli.  Lower extremity duplex U/S negative for DVT's bilaterally. --Transition IV heparin >> Eliquis today --Eliquis starter pack ordered at d/c --Stop Plavix to minimize bleeding risk (follow up with Cardiology on this) --O2 needs are stable and near baseline   Acute on chronic respiratory failure with hypoxia ([place]) Suspect multifactorial: Due to acute PE, COPD, and ?CHF, moderate pulmonary hypertension and OSA, ex smoker At baseline she wears 2 L and at night with CPAP with 3 L bleeding Supplemental O2 to keep sats above 90 Treat underlying issues as outlined Ambulatory O2 sats checked today - on 3 L/min ambulating she remained in mid 90's Baseline 2 L/min O2   COPD with acute exacerbation (HCC) Scheduled and as needed nebulized bronchodilator treatment Was given IV steroids, followed by oral prednisone (4 day taper at d/c) Antitussives, flutter valve, supportive care Mucinex  Outpatient pulmonology follow up   Pulmonary hypertension ([place]) Right heart cath 09/2021  "Pulmonary hypertension is moderate and seems to be of a mixed arterial and venous etiology "   Sleep apnea - OSA on CPAP: With 3 L bleed in.   Followed by pulmonologist, Dr. Silas Flood   CAD (coronary artery disease) Presented with chest pain with increased work of breathing but negative troponin and EKG is nonacute Atypical chest pain secondary to work of breathing, this has resolved. Continue Plavix, rosuvastatin and lisinopril Follows  with [facility] cardiology   Essential hypertension  Controlled.  Continue lisinopril   Fibromyalgia - Continue baclofen  Insomnia - Continue trazodone       Consultants: None  Procedures performed: Echo  Disposition: Home  health Diet recommendation:  Discharge Diet Orders (From admission, onward)     Start     Ordered   02/07/22 0000  Diet - low sodium heart healthy        02/07/22 1347           Cardiac diet DISCHARGE MEDICATION: Allergies as of 02/07/2022       Reactions   Morphine And Related Nausea And Vomiting   Celexa [citalopram Hydrobromide] Other (See Comments)   Pruritis   Effexor [venlafaxine] Other (See Comments)   Panic attack   Lipitor [atorvastatin] Other (See Comments)   Myalgias   Shellfish Allergy Hives, Swelling   Wellbutrin [bupropion] Anxiety        Medication List     STOP taking these medications    clopidogrel 75 MG tablet Commonly known as: PLAVIX       TAKE these medications    albuterol 108 (90 Base) MCG/ACT inhaler Commonly known as: VENTOLIN HFA INHALE 2 PUFFS INTO THE LUNGS EVERY 6 HOURS AS NEEDED FOR WHEEZING OR SHORTNESS OF BREATH   ALPRAZolam 0.5 MG tablet Commonly known as: XANAX Take 1 tablet (0.5 mg total) by mouth 3 (three) times daily as needed for anxiety.   Apixaban Starter Pack ('10mg'$  and '5mg'$ ) Commonly known as: ELIQUIS STARTER PACK Take as directed on package: start with two-'5mg'$  tablets twice daily for 7 days. On day 8, switch to one-'5mg'$  tablet twice daily.   baclofen 10 MG tablet Commonly known as: LIORESAL TAKE 1 TABLET(10 MG) BY MOUTH THREE TIMES DAILY   budesonide-formoterol 160-4.5 MCG/ACT inhaler Commonly known as: SYMBICORT Inhale 2 puffs into the lungs 2 (two) times daily.   CALCIUM 1200 PO Take by mouth daily.   cetirizine 10 MG tablet Commonly known as: ZYRTEC Take 1 tablet (10 mg total) by mouth daily.   empagliflozin 10 MG Tabs tablet Commonly known as: Jardiance Take 1 tablet (10 mg total) by mouth daily before breakfast.   esomeprazole 40 MG capsule Commonly known as: NEXIUM TAKE ONE CAPSULE BY MOUTH TWICE DAILY What changed: additional instructions   furosemide 40 MG tablet Commonly known as:  LASIX TAKE 1 TABLET(40 MG) BY MOUTH DAILY AS NEEDED What changed: See the new instructions.   hydrOXYzine 25 MG tablet Commonly known as: ATARAX TAKE 1 TABLET(25 MG) BY MOUTH THREE TIMES DAILY AS NEEDED   lisinopril 2.5 MG tablet Commonly known as: ZESTRIL TAKE 1 TABLET(2.5 MG) BY MOUTH DAILY   Magnesium 400 MG Caps Take by mouth. Take 6 tablets daily   meloxicam 15 MG tablet Commonly known as: MOBIC TAKE 1 TABLET(15 MG) BY MOUTH DAILY   Potassium Gluconate 595 MG Caps Take by mouth. Take 4 capsules daily   predniSONE 10 MG tablet Commonly known as: DELTASONE Take 2 tablets (20 mg total) by mouth daily with breakfast for 1 day, THEN 1 tablet (10 mg total) daily with breakfast for 1 day, THEN 0.5 tablets (5 mg total) daily with breakfast for 2 days. Start taking on: February 08, 2022   rosuvastatin 20 MG tablet Commonly known as: CRESTOR Take 1 tablet (20 mg total) by mouth daily.   traZODone 50 MG tablet Commonly known as: DESYREL Take 0.5 tablets (25 mg total) by mouth at bedtime.   triamcinolone  cream 0.1 % Commonly known as: KENALOG Apply 1 application. topically 2 (two) times daily.               Durable Medical Equipment  (From admission, onward)           Start     Ordered   02/07/22 1016  For home use only DME Walker rolling  Once       Question Answer Comment  Walker: With [place] Wheels   Patient needs a walker to treat with the following condition Unsteady gait      02/07/22 1015            Discharge Exam: Filed Weights   02/02/22 1344 02/05/22 0856  Weight: 85.7 kg 83.6 kg   General exam: awake, alert, no acute distress HEENT: atraumatic, clear conjunctiva, anicteric sclera, moist mucus membranes, hearing grossly normal  Respiratory system: CTAB improved aeration, no wheezes, rales or rhonchi, normal respiratory effort at rest on 2.5 L/min [place] o2. Cardiovascular system: normal S1/S2, RRR,no pedal edema.   Gastrointestinal system:  soft, NT, ND, no HSM felt, +bowel sounds. Central nervous system: A&O x3. no gross focal neurologic deficits, normal speech Extremities: moves all, no edema, normal tone Skin: dry, intact, normal temperature, normal color, No rashes, lesions or ulcers Psychiatry: anxious mood, congruent affect, judgement and insight appear normal   Condition at discharge: stable  The results of significant diagnostics from this hospitalization (including imaging, microbiology, ancillary and laboratory) are listed below for reference.   Imaging Studies: ECHOCARDIOGRAM COMPLETE  Result Date: 02/06/2022    ECHOCARDIOGRAM REPORT   Patient Name:   ARIAH MOWER [facility] Date of Exam: 02/06/2022 Medical Rec #:  [id number]       Height:       65.0 in Accession #:    [id number]      Weight:       184.3 lb Date of Birth:  11/28/1955       BSA:          1.911 m Patient Age:    38 years        BP:           120/84 mmHg Patient Gender: F               HR:           70 bpm. Exam Location:  [facility] Procedure: 2D Echo, Cardiac Doppler and Color Doppler Indications:     Pulmonary embolus I26.09  History:         Patient has prior history of Echocardiogram examinations, most                  recent 08/13/2021. COPD; Risk Factors:Hypertension.  Sonographer:     Sherrie Sport Referring Phys:  [id number] Claiborne Billings A Kermitt Harjo Diagnosing Phys: Ida Rogue MD  Sonographer Comments: Image acquisition challenging due to COPD and The only obtainable view was subcostal. IMPRESSIONS  1. Left ventricular ejection fraction, by estimation, is 60 to 65%. The left ventricle has normal function. The left ventricle has no regional wall motion abnormalities. There is mild left ventricular hypertrophy. Left ventricular diastolic parameters are indeterminate.  2. Right ventricular systolic function is low normal. The right ventricular size is mildly enlarged. Tricuspid regurgitation signal is inadequate for assessing PA pressure.  3. The mitral valve is normal in structure.  No evidence of mitral valve regurgitation. No evidence of mitral stenosis.  4. The aortic valve is normal in structure. Aortic valve regurgitation  is not visualized. Aortic valve sclerosis is present, with no evidence of aortic valve stenosis.  5. The inferior vena cava is normal in size with greater than 50% respiratory variability, suggesting right atrial pressure of 3 mmHg. FINDINGS  Left Ventricle: Left ventricular ejection fraction, by estimation, is 60 to 65%. The left ventricle has normal function. The left ventricle has no regional wall motion abnormalities. The left ventricular internal cavity size was normal in size. There is  mild left ventricular hypertrophy. Left ventricular diastolic parameters are indeterminate. Right Ventricle: The right ventricular size is mildly enlarged. No increase in right ventricular wall thickness. Right ventricular systolic function is low normal. Tricuspid regurgitation signal is inadequate for assessing PA pressure. Left Atrium: Left atrial size was normal in size. Right Atrium: Right atrial size was normal in size. Pericardium: There is no evidence of pericardial effusion. Mitral Valve: The mitral valve is normal in structure. No evidence of mitral valve regurgitation. No evidence of mitral valve stenosis. Tricuspid Valve: The tricuspid valve is normal in structure. Tricuspid valve regurgitation is not demonstrated. No evidence of tricuspid stenosis. Aortic Valve: The aortic valve is normal in structure. Aortic valve regurgitation is not visualized. Aortic valve sclerosis is present, with no evidence of aortic valve stenosis. Pulmonic Valve: The pulmonic valve was normal in structure. Pulmonic valve regurgitation is not visualized. No evidence of pulmonic stenosis. Aorta: The aortic root is normal in size and structure. Venous: The inferior vena cava is normal in size with greater than 50% respiratory variability, suggesting right atrial pressure of 3 mmHg. IAS/Shunts: No  atrial level shunt detected by color flow Doppler.  LEFT VENTRICLE PLAX 2D LVIDd:         3.90 cm LVIDs:         2.50 cm LV PW:         1.30 cm LV IVS:        1.40 cm  Ida Rogue MD Electronically signed by Ida Rogue MD Signature Date/Time: 02/06/2022/5:43:27 PM    Final    US Venous Img Lower Bilateral (DVT)  Result Date: 02/06/2022 CLINICAL DATA:  Positive D-dimer EXAM: BILATERAL LOWER EXTREMITY VENOUS DOPPLER ULTRASOUND TECHNIQUE: Gray-scale sonography with graded compression, as well as color Doppler and duplex ultrasound were performed to evaluate the lower extremity deep venous systems from the level of the common femoral vein and including the common femoral, femoral, profunda femoral, popliteal and calf veins including the posterior tibial, peroneal and gastrocnemius veins when visible. The superficial great saphenous vein was also interrogated. Spectral Doppler was utilized to evaluate flow at rest and with distal augmentation maneuvers in the common femoral, femoral and popliteal veins. COMPARISON:  None Available. FINDINGS: RIGHT LOWER EXTREMITY Common Femoral Vein: No evidence of thrombus. Normal compressibility, respiratory phasicity and response to augmentation. Saphenofemoral Junction: No evidence of thrombus. Normal compressibility and flow on color Doppler imaging. Profunda Femoral Vein: No evidence of thrombus. Normal compressibility and flow on color Doppler imaging. Femoral Vein: No evidence of thrombus. Normal compressibility, respiratory phasicity and response to augmentation. Popliteal Vein: No evidence of thrombus. Normal compressibility, respiratory phasicity and response to augmentation. Calf Veins: No evidence of thrombus. Normal compressibility and flow on color Doppler imaging. Superficial Great Saphenous Vein: No evidence of thrombus. Normal compressibility. Venous Reflux:  None. Other Findings:  None. LEFT LOWER EXTREMITY Common Femoral Vein: No evidence of thrombus. Normal  compressibility, respiratory phasicity and response to augmentation. Saphenofemoral Junction: No evidence of thrombus. Normal compressibility and flow on color Doppler  imaging. Profunda Femoral Vein: No evidence of thrombus. Normal compressibility and flow on color Doppler imaging. Femoral Vein: No evidence of thrombus. Normal compressibility, respiratory phasicity and response to augmentation. Popliteal Vein: No evidence of thrombus. Normal compressibility, respiratory phasicity and response to augmentation. Calf Veins: No evidence of thrombus. Normal compressibility and flow on color Doppler imaging. Superficial Great Saphenous Vein: No evidence of thrombus. Normal compressibility. Venous Reflux:  None. Other Findings:  None. IMPRESSION: No evidence of deep venous thrombosis in either lower extremity. Electronically Signed   By: Elmer Picker M.D.   On: 02/06/2022 11:44   CT Angio Chest Pulmonary Embolism (PE) W or WO Contrast  Result Date: 02/06/2022 CLINICAL DATA:  Positive D-dimer, clinical suspicion for PE EXAM: CT ANGIOGRAPHY CHEST WITH CONTRAST TECHNIQUE: Multidetector CT imaging of the chest was performed using the standard protocol during bolus administration of intravenous contrast. Multiplanar CT image reconstructions and MIPs were obtained to evaluate the vascular anatomy. RADIATION DOSE REDUCTION: This exam was performed according to the departmental dose-optimization program which includes automated exposure control, adjustment of the mA and/or kV according to patient size and/or use of iterative reconstruction technique. CONTRAST:  84m OMNIPAQUE IOHEXOL 350 MG/ML SOLN COMPARISON:  06/27/2021 FINDINGS: Cardiovascular: In image 96 of series 4, there is small central filling defect in is subsegmental branch in right lower lobe. In image 102 of series 7, there is possible tiny central luminal filling defect in subsegmental branch in right upper lobe. No other definite intraluminal filling  defects are seen in pulmonary artery branches. There is ectasia of main pulmonary artery measuring 3.7 cm in diameter. Contrast density in thoracic aorta is less than optimal. As far as seen, there is no demonstrable intimal flap. There are scattered calcifications seen thoracic aorta. There are few scattered coronary artery calcifications. Mediastinum/Nodes: Slightly enlarged lymph nodes in mediastinum and hilar regions have not changed significantly. Lungs/Pleura: There is no focal pulmonary consolidation. There is mild peribronchial thickening. In image 53 of series 6, there is 7 mm area of increased interstitial markings in posterior segment of right upper lobe. In image 28 of series 6, there is 9 mm pleural-based density in the posterior left upper lobe. These findings were not seen in the previous examination. Linear densities are seen in both lower lung fields. Upper Abdomen: No acute findings are seen. Musculoskeletal: Slight decrease in height of few thoracic vertebral bodies has not changed. Review of the MIP images confirms the above findings. IMPRESSION: There are tiny filling defects in few subsegmental branches in right upper lobe and right lower lobe. Findings suggest pulmonary embolism with minimal thrombus burden. Aortic atherosclerosis. Scattered coronary artery calcifications are seen. There is ectasia of main pulmonary artery suggesting pulmonary arterial hypertension. There is no focal pulmonary consolidation. There is no pleural effusion. There are small nodular densities in both upper lobes. Follow-up CT chest in 3 months may be considered. These results will be called to the ordering clinician or representative by the Radiologist Assistant, and communication documented in the PACS or [employer] Electronically Signed   By: PElmer PickerM.D.   On: 02/06/2022 11:43   DG Chest 2 View  Result Date: 02/02/2022 CLINICAL DATA:  Shortness of breath EXAM: CHEST - 2 VIEW COMPARISON:   06/27/2021 FINDINGS: The heart size and mediastinal contours are within normal limits. Minimal diffuse interstitial pulmonary opacity. The visualized skeletal structures are unremarkable. IMPRESSION: Minimal diffuse interstitial pulmonary opacity, suggesting edema. No focal airspace opacity. Electronically Signed   By: ACristie Hem  Cherly Beach M.D.   On: 02/02/2022 14:18    Microbiology: Results for orders placed or performed during the hospital encounter of 02/02/22  Resp panel by RT-PCR (RSV, Flu A&B, Covid) Anterior Nasal Swab     Status: None   Collection Time: 02/02/22  8:19 PM   Specimen: Anterior Nasal Swab  Result Value Ref Range Status   SARS Coronavirus 2 by RT PCR NEGATIVE NEGATIVE Final    Comment: (NOTE) SARS-CoV-2 target nucleic acids are NOT DETECTED.  The SARS-CoV-2 RNA is generally detectable in upper respiratory specimens during the acute phase of infection. The lowest concentration of SARS-CoV-2 viral copies this assay can detect is 138 copies/mL. A negative result does not preclude SARS-Cov-2 infection and should not be used as the sole basis for treatment or other patient management decisions. A negative result may occur with  improper specimen collection/handling, submission of specimen other than nasopharyngeal swab, presence of viral mutation(s) within the areas targeted by this assay, and inadequate number of viral copies(<138 copies/mL). A negative result must be combined with clinical observations, patient history, and epidemiological information. The expected result is Negative.  Fact Sheet for Patients:  EntrepreneurPulse.com.au  Fact Sheet for Healthcare Providers:  IncredibleEmployment.be  This test is no t yet approved or cleared by the [place] FDA and  has been authorized for detection and/or diagnosis of SARS-CoV-2 by FDA under an Emergency Use Authorization (EUA). This EUA will remain  in effect (meaning this test can  be used) for the duration of the COVID-19 declaration under Section 564(b)(1) of the Act, 21 U.S.C.section 360bbb-3(b)(1), unless the authorization is terminated  or revoked sooner.       Influenza A by PCR NEGATIVE NEGATIVE Final   Influenza B by PCR NEGATIVE NEGATIVE Final    Comment: (NOTE) The Xpert Xpress SARS-CoV-2/FLU/RSV plus assay is intended as an aid in the diagnosis of influenza from Nasopharyngeal swab specimens and should not be used as a sole basis for treatment. Nasal washings and aspirates are unacceptable for Xpert Xpress SARS-CoV-2/FLU/RSV testing.  Fact Sheet for Patients: EntrepreneurPulse.com.au  Fact Sheet for Healthcare Providers: IncredibleEmployment.be  This test is not yet approved or cleared by the [place] FDA and has been authorized for detection and/or diagnosis of SARS-CoV-2 by FDA under an Emergency Use Authorization (EUA). This EUA will remain in effect (meaning this test can be used) for the duration of the COVID-19 declaration under Section 564(b)(1) of the Act, 21 U.S.C. section 360bbb-3(b)(1), unless the authorization is terminated or revoked.     Resp Syncytial Virus by PCR NEGATIVE NEGATIVE Final    Comment: (NOTE) Fact Sheet for Patients: EntrepreneurPulse.com.au  Fact Sheet for Healthcare Providers: IncredibleEmployment.be  This test is not yet approved or cleared by the [place] FDA and has been authorized for detection and/or diagnosis of SARS-CoV-2 by FDA under an Emergency Use Authorization (EUA). This EUA will remain in effect (meaning this test can be used) for the duration of the COVID-19 declaration under Section 564(b)(1) of the Act, 21 U.S.C. section 360bbb-3(b)(1), unless the authorization is terminated or revoked.  Performed at [facility], [address] [place], [place] [address]     Labs: CBC: Recent Labs  Lab  02/02/22 1358 02/04/22 0505 02/05/22 0441 02/06/22 0341 02/07/22 0301  WBC 6.4 10.9* 8.9 9.3 10.4  HGB 13.4 12.7 12.5 13.0 13.2  HCT 42.7 39.2 39.8 40.6 41.0  MCV 91.2 90.3 92.8 90.4 90.1  PLT 125* 155 152 159 178  Basic Metabolic Panel: Recent Labs  Lab 02/02/22 1358 02/03/22 0811 02/04/22 0505 02/05/22 0441 02/06/22 0341 02/07/22 0301  NA 139 138 139 141 141 136  K 3.8 3.7 3.8 3.9 3.6 4.1  CL 103 100 102 101 99 99  CO2 '26 27 29 31 31 27  '$ GLUCOSE 126* 166* 123* 109* 101* 164*  BUN 22 29* 39* 40* 36* 40*  CREATININE 0.95 0.95 1.09* 1.03* 0.97 0.99  CALCIUM 8.7* 9.2 9.0 8.7* 8.6* 8.4*  MG 1.9  --   --   --  2.3  --    Liver Function Tests: No results for input(s): "AST", "ALT", "ALKPHOS", "BILITOT", "PROT", "ALBUMIN" in the last 168 hours. CBG: No results for input(s): "GLUCAP" in the last 168 hours.  Discharge time spent: greater than 30 minutes.  Signed: Ezekiel Slocumb, DO [facility] 02/07/2022

## 2022-02-07 NOTE — TOC Progression Note (Addendum)
Transition of Care [facility]) - Progression Note    Patient Details  Name: Brandi Steele MRN: [id number] Date of Birth: [birth year]  Transition of Care [facility]) CM/SW [place], [place] Phone Number: 02/07/2022, 10:11 AM  Clinical Narrative:     RW ordered to be delivered to hospital room today via [employer] for potential dc, Patient is set up with [facility] for PT, [place] orders are in.  Patient is active with [employer] for home o2 and cpap.   No further needs at this time..  Expected Discharge Plan: [place] Barriers to Discharge: Continued Medical Work up  Expected Discharge Plan and Services       Living arrangements for the past 2 months: Single Family Home                                       Social Determinants of Health (SDOH) Interventions SDOH Screenings   Food Insecurity: No Food Insecurity (02/03/2022)  Housing: Low Risk  (02/03/2022)  Transportation Needs: No Transportation Needs (02/03/2022)  Utilities: Not At Risk (02/03/2022)  Alcohol Screen: Low Risk  (10/21/2021)  Depression (PHQ2-9): Low Risk  (11/26/2021)  Financial Resource Strain: Low Risk  (10/21/2021)  Physical Activity: Insufficiently Active (10/21/2021)  Social Connections: Moderately Integrated (10/21/2021)  Stress: Stress Concern Present (10/21/2021)  Tobacco Use: Medium Risk (02/03/2022)    Readmission Risk Interventions     No data to display

## 2022-02-07 NOTE — Consult Note (Signed)
ANTICOAGULATION CONSULT NOTE  Pharmacy Consult for Heparin infusion Indication: pulmonary embolus  Allergies  Allergen Reactions   Morphine And Related Nausea And Vomiting   Celexa [Citalopram Hydrobromide] Other (See Comments)    Pruritis    Effexor [Venlafaxine] Other (See Comments)    Panic attack   Lipitor [Atorvastatin] Other (See Comments)    Myalgias   Shellfish Allergy Hives and Swelling   Wellbutrin [Bupropion] Anxiety    Patient Measurements: Height: '5\' 5"'$  (165.1 cm) Weight: 83.6 kg (184 lb 4.5 oz) IBW/kg (Calculated) : 57 Heparin Dosing Weight: 75.6 kg  Vital Signs: Temp: 98.4 F (36.9 C) (01/12 0035) Temp Source: Axillary (01/12 0035) BP: 113/60 (01/12 0035) Pulse Rate: 57 (01/12 0035)  Labs: Recent Labs    02/05/22 0441 02/06/22 0341 02/06/22 1305 02/06/22 2016 02/07/22 0301  HGB 12.5 13.0  --   --  13.2  HCT 39.8 40.6  --   --  41.0  PLT 152 159  --   --  178  APTT  --   --  27  --   --   LABPROT  --   --  13.2  --   --   INR  --   --  1.0  --   --   HEPARINUNFRC  --   --   --  0.82* 1.06*  CREATININE 1.03* 0.97  --   --  0.99     Estimated Creatinine Clearance: 58 mL/min (by C-G formula based on SCr of 0.99 mg/dL).   Medical History: PTA: Plavix '75mg'$  PO QD Inpatient: Heparin infusion 1/11 >  Allergies; NO AC/APT related allergies   Assessment: 67 year old female with a PMH of CAD, HTN, PAH, CHF, COPD. Pharmacy consulted to start heparin for a PE. No DOAC PTA. Chest CT: There are tiny filling defects in few subsegmental branches in right upper lobe and right lower lobe. Findings suggest pulmonary embolism with minimal thrombus burden. CBC is stable. Baseline labs ordered. Last dose enoxaparin 40 mg daily 1/10 @ 21:59.   Date Time HL Rate/Comment 1/11 2016 0.82 SUPRAtherapeutic/ 1350 > 1200 u/hr 1/12 0301 1.06 Supratherapeutic  Goal of Therapy:  Heparin level 0.3-0.7 units/ml Monitor platelets by anticoagulation protocol:  Yes   Plan:  Decrease heparin infusion to 950 units/hr Recheck HL in 8 hrs after rate change Continue to monitor H&H and platelets daily while on heparin gtt.  Renda Rolls, PharmD, [facility] 02/07/2022 3:49 AM

## 2022-02-07 NOTE — Care Management Important Message (Signed)
Important Message  Patient Details  Name: Brandi Steele MRN: [id number] Date of Birth: [birth year]   Medicare Important Message Given:  Yes     [name] 02/07/2022, 12:44 PM

## 2022-02-07 NOTE — Progress Notes (Signed)
   02/07/22 1400  Spiritual Encounters  Type of Visit Initial  Care provided to: Pt and family  Referral source Nurse (RN/NT/LPN)  Reason for visit Routine spiritual support  OnCall Visit Yes   Chaplain met with patient and then grandson came in. Chaplain provided compassionate presence and reflective listening as patient spoke about health challenges. Patient says she want to go home today. Patient was engaging and lively during visit. Chaplain services are available for follow up as needed.

## 2022-02-10 ENCOUNTER — Telehealth: Payer: Self-pay

## 2022-02-10 NOTE — Telephone Encounter (Signed)
Transition Care Management Unsuccessful Follow-up Telephone Call  Date of discharge and from where:  [facility] 1.12.24  Attempts:  1st Attempt  Reason for unsuccessful TCM follow-up call:  Left voice message

## 2022-02-11 NOTE — Telephone Encounter (Signed)
Transition Care Management Unsuccessful Follow-up Telephone Call  Date of discharge and from where:  [facility] 1.12.24  Attempts:  2nd Attempt  Reason for unsuccessful TCM follow-up call:  Left voice message

## 2022-02-14 NOTE — Telephone Encounter (Signed)
Transition Care Management Unsuccessful Follow-up Telephone Call  Date of discharge and from where:  [facility] 1.12.24  Attempts:  3rd Attempt  Reason for unsuccessful TCM follow-up call:  Left voice message

## 2022-02-18 NOTE — Progress Notes (Signed)
MRN : [id number]  Brandi Steele is a 67 y.o. (February 22, 1955) female who presents with chief complaint of check circulation.  History of Present Illness:   The patient returns to the office for surveillance of a known abdominal aortic aneurysm. Patient denies abdominal pain or back pain, no other abdominal complaints. No changes suggesting embolic episodes.    There have been no interval changes in the patient's overall health care since his last visit.   Patient denies amaurosis fugax or TIA symptoms.    There is no history of claudication or rest pain symptoms of the lower extremities. The patient denies angina or shortness of breath.    Duplex US of the aorta and iliac arteries shows an AAA measured about 2.8 cm.  No change compared to previous study.   Carotid duplex shows <40% bilateral carotid stenosis.  No outpatient medications have been marked as taking for the 02/20/22 encounter (Appointment) with Delana Meyer, Dolores Lory, MD.    Past Medical History:  Diagnosis Date   Anxiety    Aortic stenosis    CHF (congestive heart failure) (HCC)    COPD (chronic obstructive pulmonary disease) ([place])    Depression    Fibromyalgia    GERD (gastroesophageal reflux disease)    Hyperlipidemia    Hypertension    Hypertensive heart disease    Myalgia    Obesity    Sciatica    Seborrheic keratosis    Stress incontinence     Past Surgical History:  Procedure Laterality Date   RIGHT HEART CATH N/A 10/07/2021   Procedure: RIGHT HEART CATH;  Surgeon: Wellington Hampshire, MD;  Location: [place] [facility];  Service: Cardiovascular;  Laterality: N/A;   TONSILLECTOMY     TOTAL ABDOMINAL HYSTERECTOMY     partial    Social History Social History   Tobacco Use   Smoking status: Former    Packs/day: 1.00    Years: 40.00    Total pack years: 40.00    Types: Cigarettes    Quit date: 11/01/2016    Years since quitting: 5.3   Smokeless tobacco: Never   Tobacco comments:    Is trying  cold-turkey with social support  Vaping Use   Vaping Use: Former   Start date: 12/02/2016   Quit date: 09/06/2021  Substance Use Topics   Alcohol use: No    Alcohol/week: 0.0 standard drinks of alcohol   Drug use: No    Family History Family History  Problem Relation Age of Onset   Stroke Mother    Hypertension Mother    Heart disease Father    Diabetes Brother    Stroke Maternal Grandmother    Stroke Maternal Grandfather    Heart disease Paternal Grandmother    Heart disease Paternal Grandfather    Breast cancer Neg Hx     Allergies  Allergen Reactions   Morphine And Related Nausea And Vomiting   Celexa [Citalopram Hydrobromide] Other (See Comments)    Pruritis    Effexor [Venlafaxine] Other (See Comments)    Panic attack   Lipitor [Atorvastatin] Other (See Comments)    Myalgias   Shellfish Allergy Hives and Swelling   Wellbutrin [Bupropion] Anxiety     REVIEW OF SYSTEMS (Negative unless checked)  Constitutional: '[]'$ Weight loss  '[]'$ Fever  '[]'$ Chills Cardiac: '[]'$ Chest pain   '[]'$ Chest pressure   '[]'$ Palpitations   '[]'$ Shortness of breath when laying flat   '[]'$ Shortness of breath with exertion. Vascular:  '[x]'$ Pain in legs with walking   '[]'$   Pain in legs at rest  '[]'$ History of DVT   '[]'$ Phlebitis   '[]'$ Swelling in legs   '[]'$ Varicose veins   '[]'$ Non-healing ulcers Pulmonary:   '[]'$ Uses home oxygen   '[]'$ Productive cough   '[]'$ Hemoptysis   '[]'$ Wheeze  '[x]'$ COPD   '[]'$ Asthma Neurologic:  '[]'$ Dizziness   '[]'$ Seizures   '[]'$ History of stroke   '[]'$ History of TIA  '[]'$ Aphasia   '[]'$ Vissual changes   '[]'$ Weakness or numbness in arm   '[]'$ Weakness or numbness in leg Musculoskeletal:   '[]'$ Joint swelling   '[]'$ Joint pain   '[x]'$ Low back pain Hematologic:  '[]'$ Easy bruising  '[]'$ Easy bleeding   '[]'$ Hypercoagulable state   '[]'$ Anemic Gastrointestinal:  '[]'$ Diarrhea   '[]'$ Vomiting  '[x]'$ Gastroesophageal reflux/heartburn   '[]'$ Difficulty swallowing. Genitourinary:  '[]'$ Chronic kidney disease   '[]'$ Difficult urination  '[]'$ Frequent urination   '[]'$ Blood in  urine Skin:  '[]'$ Rashes   '[]'$ Ulcers  Psychological:  '[]'$ History of anxiety   '[]'$  History of major depression.  Physical Examination  There were no vitals filed for this visit. There is no height or weight on file to calculate BMI. Gen: WD/WN, NAD Head: [place]/AT, No temporalis wasting.  Ear/Nose/Throat: Hearing grossly intact, nares w/o erythema or drainage Eyes: PER, EOMI, sclera nonicteric.  Neck: Supple, no masses.  No bruit or JVD.  Pulmonary:  Good air movement, no audible wheezing, no use of accessory muscles.  Cardiac: RRR, normal S1, S2, no Murmurs. Vascular:  mild trophic changes, no open wounds Vessel Right Left  Radial Palpable Palpable  PT Not Palpable Not Palpable  DP Not Palpable Not Palpable  Gastrointestinal: soft, non-distended. No guarding/no peritoneal signs.  Musculoskeletal: M/S 5/5 throughout.  No visible deformity.  Neurologic: CN 2-12 intact. Pain and light touch intact in extremities.  Symmetrical.  Speech is fluent. Motor exam as listed above. Psychiatric: Judgment intact, Mood & affect appropriate for pt's clinical situation. Dermatologic: No rashes or ulcers noted.  No changes consistent with cellulitis.   CBC Lab Results  Component Value Date   WBC 10.4 02/07/2022   HGB 13.2 02/07/2022   HCT 41.0 02/07/2022   MCV 90.1 02/07/2022   PLT 178 02/07/2022    BMET    Component Value Date/Time   NA 136 02/07/2022 0301   NA 141 11/26/2021 1010   NA 143 06/16/2013 1946   K 4.1 02/07/2022 0301   K 4.0 06/16/2013 1946   CL 99 02/07/2022 0301   CL 103 06/16/2013 1946   CO2 27 02/07/2022 0301   CO2 31 06/16/2013 1946   GLUCOSE 164 (H) 02/07/2022 0301   GLUCOSE 93 06/16/2013 1946   BUN 40 (H) 02/07/2022 0301   BUN 21 11/26/2021 1010   BUN 13 06/16/2013 1946   CREATININE 0.99 02/07/2022 0301   CREATININE 0.63 06/16/2013 1946   CALCIUM 8.4 (L) 02/07/2022 0301   CALCIUM 9.0 06/16/2013 1946   GFRNONAA >60 02/07/2022 0301   GFRNONAA >60 06/16/2013 1946    GFRAA 106 12/08/2019 1020   GFRAA >60 06/16/2013 1946   Estimated Creatinine Clearance: 58 mL/min (by C-G formula based on SCr of 0.99 mg/dL).  COAG Lab Results  Component Value Date   INR 1.0 02/06/2022    Radiology ECHOCARDIOGRAM COMPLETE  Result Date: 02/06/2022    ECHOCARDIOGRAM REPORT   Patient Name:   ANTASIA HAIDER [facility] Date of Exam: 02/06/2022 Medical Rec #:  [id number]       Height:       65.0 in Accession #:    [id number]      Weight:  184.3 lb Date of Birth:  November 11, 1955       BSA:          1.911 m Patient Age:    56 years        BP:           120/84 mmHg Patient Gender: F               HR:           70 bpm. Exam Location:  [facility] Procedure: 2D Echo, Cardiac Doppler and Color Doppler Indications:     Pulmonary embolus I26.09  History:         Patient has prior history of Echocardiogram examinations, most                  recent 08/13/2021. COPD; Risk Factors:Hypertension.  Sonographer:     Sherrie Sport Referring Phys:  [id number] Claiborne Billings A GRIFFITH Diagnosing Phys: Ida Rogue MD  Sonographer Comments: Image acquisition challenging due to COPD and The only obtainable view was subcostal. IMPRESSIONS  1. Left ventricular ejection fraction, by estimation, is 60 to 65%. The left ventricle has normal function. The left ventricle has no regional wall motion abnormalities. There is mild left ventricular hypertrophy. Left ventricular diastolic parameters are indeterminate.  2. Right ventricular systolic function is low normal. The right ventricular size is mildly enlarged. Tricuspid regurgitation signal is inadequate for assessing PA pressure.  3. The mitral valve is normal in structure. No evidence of mitral valve regurgitation. No evidence of mitral stenosis.  4. The aortic valve is normal in structure. Aortic valve regurgitation is not visualized. Aortic valve sclerosis is present, with no evidence of aortic valve stenosis.  5. The inferior vena cava is normal in size with greater than 50% respiratory  variability, suggesting right atrial pressure of 3 mmHg. FINDINGS  Left Ventricle: Left ventricular ejection fraction, by estimation, is 60 to 65%. The left ventricle has normal function. The left ventricle has no regional wall motion abnormalities. The left ventricular internal cavity size was normal in size. There is  mild left ventricular hypertrophy. Left ventricular diastolic parameters are indeterminate. Right Ventricle: The right ventricular size is mildly enlarged. No increase in right ventricular wall thickness. Right ventricular systolic function is low normal. Tricuspid regurgitation signal is inadequate for assessing PA pressure. Left Atrium: Left atrial size was normal in size. Right Atrium: Right atrial size was normal in size. Pericardium: There is no evidence of pericardial effusion. Mitral Valve: The mitral valve is normal in structure. No evidence of mitral valve regurgitation. No evidence of mitral valve stenosis. Tricuspid Valve: The tricuspid valve is normal in structure. Tricuspid valve regurgitation is not demonstrated. No evidence of tricuspid stenosis. Aortic Valve: The aortic valve is normal in structure. Aortic valve regurgitation is not visualized. Aortic valve sclerosis is present, with no evidence of aortic valve stenosis. Pulmonic Valve: The pulmonic valve was normal in structure. Pulmonic valve regurgitation is not visualized. No evidence of pulmonic stenosis. Aorta: The aortic root is normal in size and structure. Venous: The inferior vena cava is normal in size with greater than 50% respiratory variability, suggesting right atrial pressure of 3 mmHg. IAS/Shunts: No atrial level shunt detected by color flow Doppler.  LEFT VENTRICLE PLAX 2D LVIDd:         3.90 cm LVIDs:         2.50 cm LV PW:         1.30 cm LV IVS:  1.40 cm  Ida Rogue MD Electronically signed by Ida Rogue MD Signature Date/Time: 02/06/2022/5:43:27 PM    Final    US Venous Img Lower Bilateral  (DVT)  Result Date: 02/06/2022 CLINICAL DATA:  Positive D-dimer EXAM: BILATERAL LOWER EXTREMITY VENOUS DOPPLER ULTRASOUND TECHNIQUE: Gray-scale sonography with graded compression, as well as color Doppler and duplex ultrasound were performed to evaluate the lower extremity deep venous systems from the level of the common femoral vein and including the common femoral, femoral, profunda femoral, popliteal and calf veins including the posterior tibial, peroneal and gastrocnemius veins when visible. The superficial great saphenous vein was also interrogated. Spectral Doppler was utilized to evaluate flow at rest and with distal augmentation maneuvers in the common femoral, femoral and popliteal veins. COMPARISON:  None Available. FINDINGS: RIGHT LOWER EXTREMITY Common Femoral Vein: No evidence of thrombus. Normal compressibility, respiratory phasicity and response to augmentation. Saphenofemoral Junction: No evidence of thrombus. Normal compressibility and flow on color Doppler imaging. Profunda Femoral Vein: No evidence of thrombus. Normal compressibility and flow on color Doppler imaging. Femoral Vein: No evidence of thrombus. Normal compressibility, respiratory phasicity and response to augmentation. Popliteal Vein: No evidence of thrombus. Normal compressibility, respiratory phasicity and response to augmentation. Calf Veins: No evidence of thrombus. Normal compressibility and flow on color Doppler imaging. Superficial Great Saphenous Vein: No evidence of thrombus. Normal compressibility. Venous Reflux:  None. Other Findings:  None. LEFT LOWER EXTREMITY Common Femoral Vein: No evidence of thrombus. Normal compressibility, respiratory phasicity and response to augmentation. Saphenofemoral Junction: No evidence of thrombus. Normal compressibility and flow on color Doppler imaging. Profunda Femoral Vein: No evidence of thrombus. Normal compressibility and flow on color Doppler imaging. Femoral Vein: No evidence of  thrombus. Normal compressibility, respiratory phasicity and response to augmentation. Popliteal Vein: No evidence of thrombus. Normal compressibility, respiratory phasicity and response to augmentation. Calf Veins: No evidence of thrombus. Normal compressibility and flow on color Doppler imaging. Superficial Great Saphenous Vein: No evidence of thrombus. Normal compressibility. Venous Reflux:  None. Other Findings:  None. IMPRESSION: No evidence of deep venous thrombosis in either lower extremity. Electronically Signed   By: Elmer Picker M.D.   On: 02/06/2022 11:44   CT Angio Chest Pulmonary Embolism (PE) W or WO Contrast  Result Date: 02/06/2022 CLINICAL DATA:  Positive D-dimer, clinical suspicion for PE EXAM: CT ANGIOGRAPHY CHEST WITH CONTRAST TECHNIQUE: Multidetector CT imaging of the chest was performed using the standard protocol during bolus administration of intravenous contrast. Multiplanar CT image reconstructions and MIPs were obtained to evaluate the vascular anatomy. RADIATION DOSE REDUCTION: This exam was performed according to the departmental dose-optimization program which includes automated exposure control, adjustment of the mA and/or kV according to patient size and/or use of iterative reconstruction technique. CONTRAST:  2m OMNIPAQUE IOHEXOL 350 MG/ML SOLN COMPARISON:  06/27/2021 FINDINGS: Cardiovascular: In image 96 of series 4, there is small central filling defect in is subsegmental branch in right lower lobe. In image 102 of series 7, there is possible tiny central luminal filling defect in subsegmental branch in right upper lobe. No other definite intraluminal filling defects are seen in pulmonary artery branches. There is ectasia of main pulmonary artery measuring 3.7 cm in diameter. Contrast density in thoracic aorta is less than optimal. As far as seen, there is no demonstrable intimal flap. There are scattered calcifications seen thoracic aorta. There are few scattered  coronary artery calcifications. Mediastinum/Nodes: Slightly enlarged lymph nodes in mediastinum and hilar regions have not changed significantly. Lungs/Pleura: There  is no focal pulmonary consolidation. There is mild peribronchial thickening. In image 53 of series 6, there is 7 mm area of increased interstitial markings in posterior segment of right upper lobe. In image 28 of series 6, there is 9 mm pleural-based density in the posterior left upper lobe. These findings were not seen in the previous examination. Linear densities are seen in both lower lung fields. Upper Abdomen: No acute findings are seen. Musculoskeletal: Slight decrease in height of few thoracic vertebral bodies has not changed. Review of the MIP images confirms the above findings. IMPRESSION: There are tiny filling defects in few subsegmental branches in right upper lobe and right lower lobe. Findings suggest pulmonary embolism with minimal thrombus burden. Aortic atherosclerosis. Scattered coronary artery calcifications are seen. There is ectasia of main pulmonary artery suggesting pulmonary arterial hypertension. There is no focal pulmonary consolidation. There is no pleural effusion. There are small nodular densities in both upper lobes. Follow-up CT chest in 3 months may be considered. These results will be called to the ordering clinician or representative by the Radiologist Assistant, and communication documented in the PACS or [employer]. Electronically Signed   By: Elmer Picker M.D.   On: 02/06/2022 11:43   DG Chest 2 View  Result Date: 02/02/2022 CLINICAL DATA:  Shortness of breath EXAM: CHEST - 2 VIEW COMPARISON:  06/27/2021 FINDINGS: The heart size and mediastinal contours are within normal limits. Minimal diffuse interstitial pulmonary opacity. The visualized skeletal structures are unremarkable. IMPRESSION: Minimal diffuse interstitial pulmonary opacity, suggesting edema. No focal airspace opacity. Electronically  Signed   By: Delanna Ahmadi M.D.   On: 02/02/2022 14:18     Assessment/Plan 1. Abdominal aortic aneurysm (AAA) without rupture, unspecified part ([place]) No surgery or intervention at this time. The patient has an asymptomatic abdominal aortic aneurysm that is less than 3 cm in maximal diameter.  I have discussed the natural history of abdominal aortic aneurysm and the small risk of rupture for aneurysm less than 5 cm in size.  However, as these small aneurysms tend to enlarge over time, continued surveillance with ultrasound or CT scan is mandatory.  I have also discussed optimizing medical management with hypertension and lipid control and the importance of abstinence from tobacco.  The patient is also encouraged to exercise a minimum of 30 minutes 4 times a week.  Should the patient develop new onset abdominal or back pain or signs of peripheral embolization they are instructed to seek medical attention immediately and to alert the physician providing care that they have an aneurysm.  The patient voices their understanding. The patient will return in 24 months with an aortic duplex.  2. Bilateral carotid artery stenosis Recommend:   Given the patient's asymptomatic subcritical stenosis no further invasive testing or surgery at this time.   Duplex ultrasound shows <40% stenosis bilaterally.   Continue antiplatelet therapy as prescribed Continue management of CAD, HTN and Hyperlipidemia Healthy heart diet,  encouraged exercise at least 4 times per week Follow up in 24 months with duplex ultrasound and physical exam   3. Essential hypertension Continue antihypertensive medications as already ordered, these medications have been reviewed and there are no changes at this time.  4. Centrilobular emphysema ([place]) Continue pulmonary medications and aerosols as already ordered, these medications have been reviewed and there are no changes at this time.     Hortencia Pilar, MD  02/18/2022 1:57  PM

## 2022-02-20 ENCOUNTER — Encounter (INDEPENDENT_AMBULATORY_CARE_PROVIDER_SITE_OTHER): Payer: Self-pay | Admitting: Vascular Surgery

## 2022-02-20 ENCOUNTER — Ambulatory Visit (INDEPENDENT_AMBULATORY_CARE_PROVIDER_SITE_OTHER): Payer: 59

## 2022-02-20 ENCOUNTER — Ambulatory Visit (INDEPENDENT_AMBULATORY_CARE_PROVIDER_SITE_OTHER): Payer: 59 | Admitting: Nurse Practitioner

## 2022-02-20 VITALS — BP 145/80 | HR 64 | Resp 16 | Wt 193.4 lb

## 2022-02-20 DIAGNOSIS — I6523 Occlusion and stenosis of bilateral carotid arteries: Secondary | ICD-10-CM

## 2022-02-20 DIAGNOSIS — I2510 Atherosclerotic heart disease of native coronary artery without angina pectoris: Secondary | ICD-10-CM

## 2022-02-20 DIAGNOSIS — I714 Abdominal aortic aneurysm, without rupture, unspecified: Secondary | ICD-10-CM

## 2022-02-20 DIAGNOSIS — I10 Essential (primary) hypertension: Secondary | ICD-10-CM | POA: Diagnosis not present

## 2022-02-20 DIAGNOSIS — J432 Centrilobular emphysema: Secondary | ICD-10-CM | POA: Diagnosis not present

## 2022-02-20 DIAGNOSIS — I700 Atherosclerosis of aorta: Secondary | ICD-10-CM

## 2022-02-22 ENCOUNTER — Encounter (INDEPENDENT_AMBULATORY_CARE_PROVIDER_SITE_OTHER): Payer: Self-pay | Admitting: Nurse Practitioner

## 2022-02-23 NOTE — Progress Notes (Signed)
ADVANCED [facility] CLINIC CONSULT NOTE  Referring Physician:Brian Agbor-Etang, MD Primary Care: Jon Billings, NP Primary Cardiologist: Kate Sable, MD   HPI:  Brandi Steele is a 67 y.o. female with a hx of CAD (RCA calcium on chest CT), HTN, HL, former smoker x 40+ years, pulmonary hypertension, COPD, chronic hypoxic respiratory failure on home O2, OSA on CPAP, PAD s/p abdominal aortic surgery over 20 years ago and chronic diastolic HF who presents for follow-up. Referred by Dr. Ralene Bathe for further evaluation of her pulmonary HTN.    Echo 03/2021 ([facility]) EF>55% , moderate TR, severe pulmonary hypertension RVSP 51mHg. Echo 07/2021 EF 60- 65%, severe pulmonary hypertension, RVSP 72.4 mmHg RV mildly reduced  Right heart cath 09/2021  RA 5  PA 56/24 (35) PCWP 18 PVR 2.6 WU Fick 6.6/3.5  CT chest: 6/23: No PE + mild emphysema  No PFTs on chart  Treadmill stress test 03/2021 showed no significant ischemic EKG changes.  Previously followed by Dr,. KNehemiah Massedand recently transitioned care to Dr. AGaren Lah   Has seen Dr. 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 Encouraged to be more compliant with home O2 and encouraged her to proceed with RHC. Stopped smoking 4 years ago but started vaping due to high stress. Stopped vaping 6 months ago/  [place]. Also takes care of her brother who lives with her and had MVA at age 2627and needs FT care. Also cares for her 2 y/o great granddaughter.   I saw her for first time in 11/23 for PAH. 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 PFTs with DLCO ordered. Stressed need to be complaint with home O2 and CPAP to keep sats > 90% at all times  Admitted in 14/50for diastolic HF. BNP minimally elevated at 100 but chest x-ray showed diffuse interstitial opacities suggesting edema. Treated with IV lasix.  CTA chest showed multiple tiny filling defects  consistent with pulmonary emboli. Started on Eliquis   Echo 1/24: EF 60-65% RV mildly reduced . No TR to estimate RVSP.   PFTs 01/23/22 with moderate obstruction FEV1 1.5 (62%)  FVC 2.57 (80%) Ratio 59% DLCO 52%  Here for routine f/u. At last visit we started Jardiance but she hasn't started yet due to fear of UTI. Tearful today about her husband who has metastatic breast cancer and dementia. Says breathing is "tight". SOB with mild activity. Wears O2. No edema, orthopnea or PND.     Past Medical History:  Diagnosis Date   Anxiety    Aortic stenosis    CHF (congestive heart failure) (HCC)    COPD (chronic obstructive pulmonary disease) (HCC)    Depression    Fibromyalgia    GERD (gastroesophageal reflux disease)    Hyperlipidemia    Hypertension    Hypertensive heart disease    Myalgia    Obesity    Sciatica    Seborrheic keratosis    Stress incontinence     Current Outpatient Medications  Medication Sig Dispense Refill   albuterol (VENTOLIN HFA) 108 (90 Base) MCG/ACT inhaler INHALE 2 PUFFS INTO THE LUNGS EVERY 6 HOURS AS NEEDED FOR WHEEZING OR SHORTNESS OF BREATH 18 g 1   ALPRAZolam (XANAX) 0.5 MG tablet Take 1 tablet (0.5 mg total) by mouth 3 (three) times daily as needed for anxiety. 30 tablet 0   APIXABAN (ELIQUIS) VTE STARTER PACK ('10MG'$  AND '5MG'$ )  Take as directed on package: start with two-'5mg'$  tablets twice daily for 7 days. On day 8, switch to one-'5mg'$  tablet twice daily. 1 each 0   baclofen (LIORESAL) 10 MG tablet TAKE 1 TABLET(10 MG) BY MOUTH THREE TIMES DAILY 30 tablet 0   budesonide-formoterol (SYMBICORT) 160-4.5 MCG/ACT inhaler Inhale 2 puffs into the lungs 2 (two) times daily. 1 each 5   Calcium Carbonate-Vit D-Min (CALCIUM 1200 PO) Take by mouth daily.     cetirizine (ZYRTEC) 10 MG tablet Take 1 tablet (10 mg total) by mouth daily. 30 tablet 11   empagliflozin (JARDIANCE) 10 MG TABS tablet Take 1 tablet (10 mg total) by mouth daily before breakfast. 30 tablet 6    esomeprazole (NEXIUM) 40 MG capsule TAKE ONE CAPSULE BY MOUTH TWICE DAILY 180 capsule 2   furosemide (LASIX) 40 MG tablet TAKE 1 TABLET(40 MG) BY MOUTH DAILY AS NEEDED 30 tablet 2   hydrOXYzine (ATARAX) 25 MG tablet TAKE 1 TABLET(25 MG) BY MOUTH THREE TIMES DAILY AS NEEDED 90 tablet 2   lisinopril (ZESTRIL) 2.5 MG tablet TAKE 1 TABLET(2.5 MG) BY MOUTH DAILY 90 tablet 0   Magnesium 400 MG CAPS Take by mouth. Take 6 tablets daily     meloxicam (MOBIC) 15 MG tablet TAKE 1 TABLET(15 MG) BY MOUTH DAILY 90 tablet 3   Potassium Gluconate 595 MG CAPS Take by mouth. Take 4 capsules daily     rosuvastatin (CRESTOR) 20 MG tablet Take 1 tablet (20 mg total) by mouth daily. 90 tablet 3   traZODone (DESYREL) 50 MG tablet Take 0.5 tablets (25 mg total) by mouth at bedtime. (Patient not taking: Reported on 02/02/2022) 60 tablet 1   triamcinolone cream (KENALOG) 0.1 % Apply 1 application. topically 2 (two) times daily. 80 g 1   No current facility-administered medications for this visit.   Facility-Administered Medications Ordered in Other Visits  Medication Dose Route Frequency Provider Last Rate Last Admin   albuterol (PROVENTIL) (2.5 MG/3ML) 0.083% nebulizer solution 2.5 mg  2.5 mg Nebulization Once Kloey Cazarez, Shaune Pascal, MD        Allergies  Allergen Reactions   Morphine And Related Nausea And Vomiting   Celexa [Citalopram Hydrobromide] Other (See Comments)    Pruritis    Effexor [Venlafaxine] Other (See Comments)    Panic attack   Lipitor [Atorvastatin] Other (See Comments)    Myalgias   Shellfish Allergy Hives and Swelling   Wellbutrin [Bupropion] Anxiety      Social History   Socioeconomic History   Marital status: Married    Spouse name: Not on file   Number of children: Not on file   Years of education: Not on file   Highest education level: Not on file  Occupational History   Not on file  Tobacco Use   Smoking status: Former    Packs/day: 1.00    Years: 40.00    Total pack years:  40.00    Types: Cigarettes    Quit date: 11/01/2016    Years since quitting: 5.3   Smokeless tobacco: Never   Tobacco comments:    Is trying cold-turkey with social support  Vaping Use   Vaping Use: Former   Start date: 12/02/2016   Quit date: 09/06/2021  Substance and Sexual Activity   Alcohol use: No    Alcohol/week: 0.0 standard drinks of alcohol   Drug use: No   Sexual activity: Not Currently  Other Topics Concern   Not on file  Social History Narrative  Not on file   Social Determinants of Health   Financial Resource Strain: Low Risk  (10/21/2021)   Overall Financial Resource Strain (CARDIA)    Difficulty of Paying Living Expenses: Not hard at all  Food Insecurity: No Food Insecurity (02/03/2022)   Hunger Vital Sign    Worried About Running Out of Food in the Last Year: Never true    Ran Out of Food in the Last Year: Never true  Transportation Needs: No Transportation Needs (02/03/2022)   PRAPARE - Hydrologist (Medical): No    Lack of Transportation (Non-Medical): No  Physical Activity: Insufficiently Active (10/21/2021)   Exercise Vital Sign    Days of Exercise per Week: 3 days    Minutes of Exercise per Session: 30 min  Stress: Stress Concern Present (10/21/2021)   [place]    Feeling of Stress : To some extent  Social Connections: Moderately Integrated (10/21/2021)   Social Connection and Isolation Panel [NHANES]    Frequency of Communication with Friends and Family: More than three times a week    Frequency of Social Gatherings with Friends and Family: Three times a week    Attends Religious Services: More than 4 times per year    Active Member of Clubs or Organizations: No    Attends Archivist Meetings: Never    Marital Status: Married  Human resources officer Violence: Not At Risk (02/03/2022)   Humiliation, Afraid, Rape, and Kick questionnaire    Fear of Current or  Ex-Partner: No    Emotionally Abused: No    Physically Abused: No    Sexually Abused: No      Family History  Problem Relation Age of Onset   Stroke Mother    Hypertension Mother    Heart disease Father    Diabetes Brother    Stroke Maternal Grandmother    Stroke Maternal Grandfather    Heart disease Paternal Grandmother    Heart disease Paternal Grandfather    Breast cancer Neg Hx     Vitals:   02/24/22 1101  BP: 119/68  Pulse: (!) 58  Resp: 16  SpO2: 98%  Weight: 196 lb 8 oz (89.1 kg)     PHYSICAL EXAM: General:  Tearful No resp difficulty HEENT: normal Neck: supple. no JVD. Carotids 2+ bilat; no bruits. No lymphadenopathy or thryomegaly appreciated. Cor: PMI nondisplaced. Regular rate & rhythm. No rubs, gallops or murmurs. Lungs: clear mildly decreased Abdomen: soft, nontender, nondistended. No hepatosplenomegaly. No bruits or masses. Good bowel sounds. Extremities: no cyanosis, clubbing, rash, edema Neuro: alert & orientedx3, cranial nerves grossly intact. moves all 4 extremities w/o difficulty. Affect pleasant   ASSESSMENT & PLAN:   1. Pulmonary HTN, mild to moderate - Echo 03/2021 ([facility]) EF>55% , moderate TR, severe pulmonary hypertension RVSP 3mHg. - Echo 07/2021 EF 60- 65%, severe pulmonary hypertension, RVSP 72.4 mmHg RV mildly reduced - Right heart cath 09/2021  RA 5  PA 56/24 (35) PCWP 18 PVR 2.6 [facility] Fick 6.6/3.5 - CT chest: 6/23: No PE + mild emphysema - CT chest 1/24 multiple tiny filling defects consistent with pulmonary emboli. - PFTs 01/23/22 with moderate obstruction FEV1 1.5 (62%)  FVC 2.57 (80%) Ratio 59% DLCO 52% - NYHA II-III - Agree that this is likely WHO Group III PH (with component of WHO Group II) and thus no role for selective pulmonary artery vasodilators. Doubt significant CTEPH but will need VQ down the road  given recent PEs - Auto-immune serologies negative - Stressed need to be compliant with home O2 and CPAP to keep sats > 90% at all  times - Watch volume status (see below) - Would benefit from weight loss - Can continue to follow with Dr. Silas Flood  2. Chronic diastolic HF - Echo 4/12: EF 60-65% RV mildly reduced . No TR to estimate RVSP. - Volume status ok but gets electrolyte abnormalities with lasix - Hasn't started Jardiance yet due to fear of UTI. Willing to start  3. Chronic hypoxic respiratory failure on home O2 - PFTs as above - Stressed need to be compliant with home O2 and CPAP to keep sats > 90% at all times - Follows with Dr. Silas Flood - Would also like to have a pulmonologist locally. Will refer to Dr. Duwayne Heck  4. CAD - per Dr. Garen Lah - no s/s angina  5. Obesity - Body mass index is 32.7 kg/m. - refer to [facility]  6. Pulmonary embolism - continue Eliquis   Glori Bickers, MD  11:07 AM

## 2022-02-24 ENCOUNTER — Ambulatory Visit: Payer: 59 | Attending: Internal Medicine | Admitting: Internal Medicine

## 2022-02-24 ENCOUNTER — Encounter: Payer: Self-pay | Admitting: Internal Medicine

## 2022-02-24 VITALS — BP 119/68 | HR 58 | Resp 16 | Wt 196.5 lb

## 2022-02-24 DIAGNOSIS — I272 Pulmonary hypertension, unspecified: Secondary | ICD-10-CM | POA: Diagnosis not present

## 2022-02-24 DIAGNOSIS — I5032 Chronic diastolic (congestive) heart failure: Secondary | ICD-10-CM

## 2022-02-24 DIAGNOSIS — I2510 Atherosclerotic heart disease of native coronary artery without angina pectoris: Secondary | ICD-10-CM | POA: Diagnosis not present

## 2022-02-24 NOTE — Patient Instructions (Signed)
Medication Changes:  BEGIN Jardiance 10 mg tablet once daily  Lab Work:  No labs ordered today  Testing/Procedures:  None today  Referrals:  Referral placed for Dr. Patsey Berthold at  [facility] in [place], at patient request of needing local pulmonolgist    Special Instructions // Education:  Do the following things EVERYDAY: Weigh yourself in the morning before breakfast. Write it down and keep it in a log. Take your medicines as prescribed Eat low salt foods--Limit salt (sodium) to 2000 mg per day.  Stay as active as you can everyday Limit all fluids for the day to less than 2 liters   Follow-Up in: 6 months with Dr. Haroldine Laws    If you have any questions or concerns before your next appointment please send [place] a message through mychart or call our office at [phone] Monday-Friday 8 am-5 pm.   If you have an urgent need after hours on the weekend please call your Primary Cardiologist or the [facility] in [place] at [phone].

## 2022-02-24 NOTE — Addendum Note (Signed)
Addended by: Maye Hides on: 02/24/2022 11:46 AM   Modules accepted: Orders

## 2022-02-25 NOTE — Progress Notes (Unsigned)
LMP  (LMP Unknown)    Subjective:    Patient ID: Brandi Steele, female    DOB: 29-May-1955, 67 y.o.   MRN: [id number]  HPI: Brandi Steele is a 67 y.o. female presenting on 02/26/2022 for comprehensive medical examination. Current medical complaints include: Patient is having trouble caring for her husband and her brother who are both medically impaired.   She currently lives with: Menopausal Symptoms: no  HYPERTENSION / HYPERLIPIDEMIA Satisfied with current treatment? yes Duration of hypertension: years BP monitoring frequency: not checking BP range:  BP medication side effects: no Past BP meds: lisinopril Duration of hyperlipidemia: years Cholesterol medication side effects: no Cholesterol supplements: none Past cholesterol medications: rosuvastatin (crestor) Medication compliance: excellent compliance Aspirin: no Recent stressors: no Recurrent headaches: no Visual changes: no Palpitations: no Dyspnea: no Chest pain: no Lower extremity edema: no Dizzy/lightheaded: no  COPD COPD status: controlled Satisfied with current treatment?: yes Oxygen use: no Dyspnea frequency:  Cough frequency:  Rescue inhaler frequency:   Limitation of activity: no Productive cough:  Last Spirometry:  Pneumovax: Not up to Date Influenza: Not up to Date  CAREGIVER STRAIN Patient is very stressed having to take care of her husband and brother. Patient states she has tried several antidepressants including Celexa, Wellbutrin, Zoloft, and Effexor.  She fees like she doesn't need something daily just someone as needed to help with the stress.  She felt numb taking Zoloft and had a difficult time coming off of the medication.   Depression Screen done today and results listed below:     11/26/2021    9:16 AM 10/21/2021    8:48 AM 10/21/2021    8:46 AM 08/26/2021    9:21 AM 06/17/2021    3:02 PM  Depression screen PHQ 2/9  Decreased Interest 0 0 0 0 0  Down, Depressed, Hopeless 0 0 0 0 0   PHQ - 2 Score 0 0 0 0 0  Altered sleeping 0 '1 1 1 '$ 0  Tired, decreased energy 0 0 0 1 0  Change in appetite 0 0 0 0 0  Feeling bad or failure about yourself  0 0 0 0 0  Trouble concentrating 0 0 0 0 0  Moving slowly or fidgety/restless 0 0 0 0 0  Suicidal thoughts 0 0  0 0  PHQ-9 Score 0 '1 1 2 '$ 0  Difficult doing work/chores Not difficult at all Not difficult at all  Not difficult at all Not difficult at all    The patient does not have a history of falls. I did complete a risk assessment for falls. A plan of care for falls was documented.   Past Medical History:  Past Medical History:  Diagnosis Date  . Anxiety   . Aortic stenosis   . CHF (congestive heart failure) ([place])   . COPD (chronic obstructive pulmonary disease) ([place])   . Depression   . Fibromyalgia   . GERD (gastroesophageal reflux disease)   . Hyperlipidemia   . Hypertension   . Hypertensive heart disease   . Myalgia   . Obesity   . Sciatica   . Seborrheic keratosis   . Stress incontinence     Surgical History:  Past Surgical History:  Procedure Laterality Date  . RIGHT HEART CATH N/A 10/07/2021   Procedure: RIGHT HEART CATH;  Surgeon: Wellington Hampshire, MD;  Location: [facility];  Service: Cardiovascular;  Laterality: N/A;  . TONSILLECTOMY    . TOTAL ABDOMINAL HYSTERECTOMY  partial    Medications:  Current Outpatient Medications on File Prior to Visit  Medication Sig  . albuterol (VENTOLIN HFA) 108 (90 Base) MCG/ACT inhaler INHALE 2 PUFFS INTO THE LUNGS EVERY 6 HOURS AS NEEDED FOR WHEEZING OR SHORTNESS OF BREATH  . ALPRAZolam (XANAX) 0.5 MG tablet Take 1 tablet (0.5 mg total) by mouth 3 (three) times daily as needed for anxiety.  [name] apixaban (ELIQUIS) 5 MG TABS tablet Take 5 mg by mouth 2 (two) times daily.  . baclofen (LIORESAL) 10 MG tablet TAKE 1 TABLET(10 MG) BY MOUTH THREE TIMES DAILY (Patient not taking: Reported on 02/24/2022)  . budesonide-formoterol (SYMBICORT) 160-4.5 MCG/ACT inhaler  Inhale 2 puffs into the lungs 2 (two) times daily.  . Calcium Carbonate-Vit D-Min (CALCIUM 1200 PO) Take by mouth daily.  . cetirizine (ZYRTEC) 10 MG tablet Take 1 tablet (10 mg total) by mouth daily.  . empagliflozin (JARDIANCE) 10 MG TABS tablet Take 1 tablet (10 mg total) by mouth daily before breakfast. (Patient not taking: Reported on 02/24/2022)  . esomeprazole (NEXIUM) 40 MG capsule TAKE ONE CAPSULE BY MOUTH TWICE DAILY  . furosemide (LASIX) 40 MG tablet TAKE 1 TABLET(40 MG) BY MOUTH DAILY AS NEEDED  . hydrOXYzine (ATARAX) 25 MG tablet TAKE 1 TABLET(25 MG) BY MOUTH THREE TIMES DAILY AS NEEDED  . lisinopril (ZESTRIL) 2.5 MG tablet TAKE 1 TABLET(2.5 MG) BY MOUTH DAILY  . Magnesium 400 MG CAPS Take by mouth. Take 4 tablets daily  . meloxicam (MOBIC) 15 MG tablet TAKE 1 TABLET(15 MG) BY MOUTH DAILY  . Potassium Gluconate 595 MG CAPS Take by mouth. Take 4 capsules daily  . rosuvastatin (CRESTOR) 20 MG tablet Take 1 tablet (20 mg total) by mouth daily.  [name] triamcinolone cream (KENALOG) 0.1 % Apply 1 application. topically 2 (two) times daily.   Current Facility-Administered Medications on File Prior to Visit  Medication  . albuterol (PROVENTIL) (2.5 MG/3ML) 0.083% nebulizer solution 2.5 mg    Allergies:  Allergies  Allergen Reactions  . Morphine And Related Nausea And Vomiting  . Celexa [Citalopram Hydrobromide] Other (See Comments)    Pruritis   . Effexor [Venlafaxine] Other (See Comments)    Panic attack  . Lipitor [Atorvastatin] Other (See Comments)    Myalgias  . Shellfish Allergy Hives and Swelling  . Wellbutrin [Bupropion] Anxiety    Social History:  Social History   Socioeconomic History  . Marital status: Married    Spouse name: Not on file  . Number of children: Not on file  . Years of education: Not on file  . Highest education level: Not on file  Occupational History  . Not on file  Tobacco Use  . Smoking status: Former    Packs/day: 1.00    Years: 40.00     Total pack years: 40.00    Types: Cigarettes    Quit date: 11/01/2016    Years since quitting: 5.3  . Smokeless tobacco: Never  . Tobacco comments:    Is trying cold-turkey with social support  Vaping Use  . Vaping Use: Former  . Start date: 12/02/2016  . Quit date: 09/06/2021  Substance and Sexual Activity  . Alcohol use: No    Alcohol/week: 0.0 standard drinks of alcohol  . Drug use: No  . Sexual activity: Not Currently  Other Topics Concern  . Not on file  Social History Narrative  . Not on file   Social Determinants of Health   Financial Resource Strain: Low Risk  (10/21/2021)  Overall Emergency planning/management officer Strain (CARDIA)   . Difficulty of Paying Living Expenses: Not hard at all  Food Insecurity: No Food Insecurity (02/03/2022)   Hunger Vital Sign   . Worried About Charity fundraiser in the Last Year: Never true   . Ran Out of Food in the Last Year: Never true  Transportation Needs: No Transportation Needs (02/03/2022)   PRAPARE - Transportation   . Lack of Transportation (Medical): No   . Lack of Transportation (Non-Medical): No  Physical Activity: Insufficiently Active (10/21/2021)   Exercise Vital Sign   . Days of Exercise per Week: 3 days   . Minutes of Exercise per Session: 30 min  Stress: Stress Concern Present (10/21/2021)   [place]   . Feeling of Stress : To some extent  Social Connections: Moderately Integrated (10/21/2021)   Social Connection and Isolation Panel [NHANES]   . Frequency of Communication with Friends and Family: More than three times a week   . Frequency of Social Gatherings with Friends and Family: Three times a week   . Attends Religious Services: More than 4 times per year   . Active Member of Clubs or Organizations: No   . Attends Archivist Meetings: Never   . Marital Status: Married  Human resources officer Violence: Not At Risk (02/03/2022)   Humiliation, Afraid, Rape, and  Kick questionnaire   . Fear of Current or Ex-Partner: No   . Emotionally Abused: No   . Physically Abused: No   . Sexually Abused: No   Social History   Tobacco Use  Smoking Status Former  . Packs/day: 1.00  . Years: 40.00  . Total pack years: 40.00  . Types: Cigarettes  . Quit date: 11/01/2016  . Years since quitting: 5.3  Smokeless Tobacco Never  Tobacco Comments   Is trying cold-turkey with social support   Social History   Substance and Sexual Activity  Alcohol Use No  . Alcohol/week: 0.0 standard drinks of alcohol    Family History:  Family History  Problem Relation Age of Onset  . Stroke Mother   . Hypertension Mother   . Heart disease Father   . Diabetes Brother   . Stroke Maternal Grandmother   . Stroke Maternal Grandfather   . Heart disease Paternal Grandmother   . Heart disease Paternal Grandfather   . Breast cancer Neg Hx     Past medical history, surgical history, medications, allergies, family history and social history reviewed with patient today and changes made to appropriate areas of the chart.   Review of Systems  Eyes:  Negative for blurred vision and double vision.  Respiratory:  Negative for shortness of breath.   Cardiovascular:  Negative for chest pain, palpitations and leg swelling.  Neurological:  Negative for dizziness and headaches.  Psychiatric/Behavioral:  Positive for depression. The patient is nervous/anxious.   All other ROS negative except what is listed above and in the HPI.      Objective:    LMP  (LMP Unknown)   Wt Readings from Last 3 Encounters:  02/24/22 196 lb 8 oz (89.1 kg)  02/20/22 193 lb 6.4 oz (87.7 kg)  02/05/22 184 lb 4.5 oz (83.6 kg)    Physical Exam Vitals and nursing note reviewed.  Constitutional:      General: She is awake. She is not in acute distress.    Appearance: She is well-developed. She is not ill-appearing.  HENT:  Head: Normocephalic and atraumatic.     Right Ear: Hearing, tympanic  membrane, ear canal and external ear normal. No drainage.     Left Ear: Hearing, tympanic membrane, ear canal and external ear normal. No drainage.     Nose: Nose normal.     Right Sinus: No maxillary sinus tenderness or frontal sinus tenderness.     Left Sinus: No maxillary sinus tenderness or frontal sinus tenderness.     Mouth/Throat:     Mouth: Mucous membranes are moist.     Pharynx: Oropharynx is clear. Uvula midline. No pharyngeal swelling, oropharyngeal exudate or posterior oropharyngeal erythema.  Eyes:     General: Lids are normal.        Right eye: No discharge.        Left eye: No discharge.     Extraocular Movements: Extraocular movements intact.     Conjunctiva/sclera: Conjunctivae normal.     Pupils: Pupils are equal, round, and reactive to light.     Visual Fields: Right eye visual fields normal and left eye visual fields normal.  Neck:     Thyroid: No thyromegaly.     Vascular: No carotid bruit.     Trachea: Trachea normal.  Cardiovascular:     Rate and Rhythm: Normal rate and regular rhythm.     Heart sounds: Normal heart sounds. No murmur heard.    No gallop.  Pulmonary:     Effort: Pulmonary effort is normal. No accessory muscle usage or respiratory distress.     Breath sounds: Normal breath sounds.  Chest:  Breasts:    Right: Normal.     Left: Normal.  Abdominal:     General: Bowel sounds are normal.     Palpations: Abdomen is soft. There is no hepatomegaly or splenomegaly.     Tenderness: There is no abdominal tenderness.  Musculoskeletal:        General: Normal range of motion.     Cervical back: Normal range of motion and neck supple.     Right lower leg: No edema.     Left lower leg: No edema.  Lymphadenopathy:     Head:     Right side of head: No submental, submandibular, tonsillar, preauricular or posterior auricular adenopathy.     Left side of head: No submental, submandibular, tonsillar, preauricular or posterior auricular adenopathy.      Cervical: No cervical adenopathy.     Upper Body:     Right upper body: No supraclavicular, axillary or pectoral adenopathy.     Left upper body: No supraclavicular, axillary or pectoral adenopathy.  Skin:    General: Skin is warm and dry.     Capillary Refill: Capillary refill takes less than 2 seconds.     Findings: No rash.  Neurological:     Mental Status: She is alert and oriented to person, place, and time.     Gait: Gait is intact.     Deep Tendon Reflexes: Reflexes are normal and symmetric.     Reflex Scores:      Brachioradialis reflexes are 2+ on the right side and 2+ on the left side.      Patellar reflexes are 2+ on the right side and 2+ on the left side. Psychiatric:        Attention and Perception: Attention normal.        Mood and Affect: Mood normal.        Speech: Speech normal.        Behavior:  Behavior normal. Behavior is cooperative.        Thought Content: Thought content normal.        Judgment: Judgment normal.    Results for orders placed or performed during the hospital encounter of 02/02/22  Resp panel by RT-PCR (RSV, Flu A&B, Covid) Anterior Nasal Swab   Specimen: Anterior Nasal Swab  Result Value Ref Range   SARS Coronavirus 2 by RT PCR NEGATIVE NEGATIVE   Influenza A by PCR NEGATIVE NEGATIVE   Influenza B by PCR NEGATIVE NEGATIVE   Resp Syncytial Virus by PCR NEGATIVE NEGATIVE  Basic metabolic panel  Result Value Ref Range   Sodium 139 135 - 145 mmol/L   Potassium 3.8 3.5 - 5.1 mmol/L   Chloride 103 98 - 111 mmol/L   CO2 26 22 - 32 mmol/L   Glucose, Bld 126 (H) 70 - 99 mg/dL   BUN 22 8 - 23 mg/dL   Creatinine, Ser 0.95 0.44 - 1.00 mg/dL   Calcium 8.7 (L) 8.9 - 10.3 mg/dL   GFR, Estimated >60 >60 mL/min   Anion gap 10 5 - 15  CBC  Result Value Ref Range   WBC 6.4 4.0 - 10.5 K/uL   RBC 4.68 3.87 - 5.11 MIL/uL   Hemoglobin 13.4 12.0 - 15.0 g/dL   HCT 42.7 36.0 - 46.0 %   MCV 91.2 80.0 - 100.0 fL   MCH 28.6 26.0 - 34.0 pg   MCHC 31.4 30.0 -  36.0 g/dL   RDW 13.5 11.5 - 15.5 %   Platelets 125 (L) 150 - 400 K/uL   nRBC 0.3 (H) 0.0 - 0.2 %  Brain natriuretic peptide  Result Value Ref Range   B Natriuretic Peptide 108.5 (H) 0.0 - 100.0 pg/mL  Magnesium  Result Value Ref Range   Magnesium 1.9 1.7 - 2.4 mg/dL  HIV Antibody (routine testing w rflx)  Result Value Ref Range   HIV Screen 4th Generation wRfx Non Reactive Non Reactive  Basic metabolic panel  Result Value Ref Range   Sodium 138 135 - 145 mmol/L   Potassium 3.7 3.5 - 5.1 mmol/L   Chloride 100 98 - 111 mmol/L   CO2 27 22 - 32 mmol/L   Glucose, Bld 166 (H) 70 - 99 mg/dL   BUN 29 (H) 8 - 23 mg/dL   Creatinine, Ser 0.95 0.44 - 1.00 mg/dL   Calcium 9.2 8.9 - 10.3 mg/dL   GFR, Estimated >60 >60 mL/min   Anion gap 11 5 - 15  CBC  Result Value Ref Range   WBC 10.9 (H) 4.0 - 10.5 K/uL   RBC 4.34 3.87 - 5.11 MIL/uL   Hemoglobin 12.7 12.0 - 15.0 g/dL   HCT 39.2 36.0 - 46.0 %   MCV 90.3 80.0 - 100.0 fL   MCH 29.3 26.0 - 34.0 pg   MCHC 32.4 30.0 - 36.0 g/dL   RDW 13.9 11.5 - 15.5 %   Platelets 155 150 - 400 K/uL   nRBC 0.0 0.0 - 0.2 %  Basic metabolic panel  Result Value Ref Range   Sodium 139 135 - 145 mmol/L   Potassium 3.8 3.5 - 5.1 mmol/L   Chloride 102 98 - 111 mmol/L   CO2 29 22 - 32 mmol/L   Glucose, Bld 123 (H) 70 - 99 mg/dL   BUN 39 (H) 8 - 23 mg/dL   Creatinine, Ser 1.09 (H) 0.44 - 1.00 mg/dL   Calcium 9.0 8.9 - 10.3 mg/dL  GFR, Estimated 55 (L) >60 mL/min   Anion gap 8 5 - 15  CBC  Result Value Ref Range   WBC 8.9 4.0 - 10.5 K/uL   RBC 4.29 3.87 - 5.11 MIL/uL   Hemoglobin 12.5 12.0 - 15.0 g/dL   HCT 39.8 36.0 - 46.0 %   MCV 92.8 80.0 - 100.0 fL   MCH 29.1 26.0 - 34.0 pg   MCHC 31.4 30.0 - 36.0 g/dL   RDW 13.9 11.5 - 15.5 %   Platelets 152 150 - 400 K/uL   nRBC 0.0 0.0 - 0.2 %  Basic metabolic panel  Result Value Ref Range   Sodium 141 135 - 145 mmol/L   Potassium 3.9 3.5 - 5.1 mmol/L   Chloride 101 98 - 111 mmol/L   CO2 31 22 - 32  mmol/L   Glucose, Bld 109 (H) 70 - 99 mg/dL   BUN 40 (H) 8 - 23 mg/dL   Creatinine, Ser 1.03 (H) 0.44 - 1.00 mg/dL   Calcium 8.7 (L) 8.9 - 10.3 mg/dL   GFR, Estimated 59 (L) >60 mL/min   Anion gap 9 5 - 15  CBC  Result Value Ref Range   WBC 9.3 4.0 - 10.5 K/uL   RBC 4.49 3.87 - 5.11 MIL/uL   Hemoglobin 13.0 12.0 - 15.0 g/dL   HCT 40.6 36.0 - 46.0 %   MCV 90.4 80.0 - 100.0 fL   MCH 29.0 26.0 - 34.0 pg   MCHC 32.0 30.0 - 36.0 g/dL   RDW 13.4 11.5 - 15.5 %   Platelets 159 150 - 400 K/uL   nRBC 0.0 0.0 - 0.2 %  Basic metabolic panel  Result Value Ref Range   Sodium 141 135 - 145 mmol/L   Potassium 3.6 3.5 - 5.1 mmol/L   Chloride 99 98 - 111 mmol/L   CO2 31 22 - 32 mmol/L   Glucose, Bld 101 (H) 70 - 99 mg/dL   BUN 36 (H) 8 - 23 mg/dL   Creatinine, Ser 0.97 0.44 - 1.00 mg/dL   Calcium 8.6 (L) 8.9 - 10.3 mg/dL   GFR, Estimated >60 >60 mL/min   Anion gap 11 5 - 15  Magnesium  Result Value Ref Range   Magnesium 2.3 1.7 - 2.4 mg/dL  D-dimer, quantitative  Result Value Ref Range   D-Dimer, Quant 1.73 (H) 0.00 - 0.50 ug/mL-FEU  APTT  Result Value Ref Range   aPTT 27 24 - 36 seconds  Protime-INR  Result Value Ref Range   Prothrombin Time 13.2 11.4 - 15.2 seconds   INR 1.0 0.8 - 1.2  Heparin level (unfractionated)  Result Value Ref Range   Heparin Unfractionated 0.82 (H) 0.30 - 0.70 IU/mL  CBC  Result Value Ref Range   WBC 10.4 4.0 - 10.5 K/uL   RBC 4.55 3.87 - 5.11 MIL/uL   Hemoglobin 13.2 12.0 - 15.0 g/dL   HCT 41.0 36.0 - 46.0 %   MCV 90.1 80.0 - 100.0 fL   MCH 29.0 26.0 - 34.0 pg   MCHC 32.2 30.0 - 36.0 g/dL   RDW 13.3 11.5 - 15.5 %   Platelets 178 150 - 400 K/uL   nRBC 0.0 0.0 - 0.2 %  Basic metabolic panel  Result Value Ref Range   Sodium 136 135 - 145 mmol/L   Potassium 4.1 3.5 - 5.1 mmol/L   Chloride 99 98 - 111 mmol/L   CO2 27 22 - 32 mmol/L   Glucose, Bld 164 (  H) 70 - 99 mg/dL   BUN 40 (H) 8 - 23 mg/dL   Creatinine, Ser 0.99 0.44 - 1.00 mg/dL   Calcium  8.4 (L) 8.9 - 10.3 mg/dL   GFR, Estimated >60 >60 mL/min   Anion gap 10 5 - 15  Heparin level (unfractionated)  Result Value Ref Range   Heparin Unfractionated 1.06 (H) 0.30 - 0.70 IU/mL  ECHOCARDIOGRAM COMPLETE  Result Value Ref Range   Weight 2,948.52 oz   Height 65 in   BP 120/84 mmHg   S' Lateral 2.50 cm   Est EF 60 - 65%   Troponin I (High Sensitivity)  Result Value Ref Range   Troponin I (High Sensitivity) 4 <18 ng/L  Troponin I (High Sensitivity)  Result Value Ref Range   Troponin I (High Sensitivity) 6 <18 ng/L      Assessment & Plan:   Problem List Items Addressed This Visit      Cardiovascular and Mediastinum   CHF (congestive heart failure) (HCC)   Essential hypertension   Aortic atherosclerosis (HCC)   Abdominal aortic aneurysm (AAA) without rupture (HCC) - Primary   Pulmonary hypertension (HCC)   Acute on chronic diastolic CHF (congestive heart failure) (HCC)     Respiratory   Centrilobular emphysema (HCC)     Endocrine   IFG (impaired fasting glucose)     Other   Obesity   Hyperlipidemia     Follow up plan: No follow-ups on file.   LABORATORY TESTING:  - Pap smear: not applicable  IMMUNIZATIONS:   - Tdap: Tetanus vaccination status reviewed: last tetanus booster within 10 years. - Influenza: Refused - Pneumovax: Refused - Prevnar: Up to date - HPV: Not applicable - Zostavax vaccine: Refused  SCREENING: -Mammogram: Up to date  - Colonoscopy:  not able to get a colonoscopy right now and Cologaurd is not covered per patient.   - Bone Density: Up to date  -Hearing Test: Not applicable  -Spirometry: Not applicable   PATIENT COUNSELING:   Advised to take 1 mg of folate supplement per day if capable of pregnancy.   Sexuality: Discussed sexually transmitted diseases, partner selection, use of condoms, avoidance of unintended pregnancy  and contraceptive alternatives.   Advised to avoid cigarette smoking.  I discussed with the patient that  most people either abstain from alcohol or drink within safe limits (<=14/week and <=4 drinks/occasion for males, <=7/weeks and <= 3 drinks/occasion for females) and that the risk for alcohol disorders and other health effects rises proportionally with the number of drinks per week and how often a drinker exceeds daily limits.  Discussed cessation/primary prevention of drug use and availability of treatment for abuse.   Diet: Encouraged to adjust caloric intake to maintain  or achieve ideal body weight, to reduce intake of dietary saturated fat and total fat, to limit sodium intake by avoiding high sodium foods and not adding table salt, and to maintain adequate dietary potassium and calcium preferably from fresh fruits, vegetables, and low-fat dairy products.    stressed the importance of regular exercise  Injury prevention: Discussed safety belts, safety helmets, smoke detector, smoking near bedding or upholstery.   Dental health: Discussed importance of regular tooth brushing, flossing, and dental visits.    NEXT PREVENTATIVE PHYSICAL DUE IN 1 YEAR. No follow-ups on file.

## 2022-02-26 ENCOUNTER — Encounter: Payer: Self-pay | Admitting: Nurse Practitioner

## 2022-02-26 ENCOUNTER — Ambulatory Visit (INDEPENDENT_AMBULATORY_CARE_PROVIDER_SITE_OTHER): Payer: 59 | Admitting: Nurse Practitioner

## 2022-02-26 VITALS — BP 118/74 | HR 59 | Temp 98.6°F | Ht 66.0 in | Wt 193.6 lb

## 2022-02-26 DIAGNOSIS — R7301 Impaired fasting glucose: Secondary | ICD-10-CM

## 2022-02-26 DIAGNOSIS — Z0000 Encounter for general adult medical examination without abnormal findings: Secondary | ICD-10-CM

## 2022-02-26 DIAGNOSIS — I714 Abdominal aortic aneurysm, without rupture, unspecified: Secondary | ICD-10-CM

## 2022-02-26 DIAGNOSIS — I700 Atherosclerosis of aorta: Secondary | ICD-10-CM

## 2022-02-26 DIAGNOSIS — E782 Mixed hyperlipidemia: Secondary | ICD-10-CM

## 2022-02-26 DIAGNOSIS — I509 Heart failure, unspecified: Secondary | ICD-10-CM | POA: Diagnosis not present

## 2022-02-26 DIAGNOSIS — I5033 Acute on chronic diastolic (congestive) heart failure: Secondary | ICD-10-CM | POA: Diagnosis not present

## 2022-02-26 DIAGNOSIS — I272 Pulmonary hypertension, unspecified: Secondary | ICD-10-CM

## 2022-02-26 DIAGNOSIS — I10 Essential (primary) hypertension: Secondary | ICD-10-CM

## 2022-02-26 DIAGNOSIS — J432 Centrilobular emphysema: Secondary | ICD-10-CM

## 2022-02-26 DIAGNOSIS — Z6831 Body mass index (BMI) 31.0-31.9, adult: Secondary | ICD-10-CM

## 2022-02-26 DIAGNOSIS — E6609 Other obesity due to excess calories: Secondary | ICD-10-CM

## 2022-02-26 LAB — URINALYSIS, ROUTINE W REFLEX MICROSCOPIC
Bilirubin, UA: NEGATIVE
Ketones, UA: NEGATIVE
Leukocytes,UA: NEGATIVE
Nitrite, UA: NEGATIVE
Protein,UA: NEGATIVE
Specific Gravity, UA: 1.01 (ref 1.005–1.030)
Urobilinogen, Ur: 0.2 mg/dL (ref 0.2–1.0)
pH, UA: 7.5 (ref 5.0–7.5)

## 2022-02-26 LAB — MICROSCOPIC EXAMINATION: Bacteria, UA: NONE SEEN

## 2022-02-26 NOTE — Assessment & Plan Note (Signed)
Labs ordered at visit today.  Will make recommendations based on lab results.   

## 2022-02-26 NOTE — Assessment & Plan Note (Signed)
BNP minimally elevated at 100 but chest x-ray showing diffuse interstitial opacities suggesting edema Echo 07/2021 EF 60- 65%. Continue with Lasix.    - Reminded to call for an overnight weight gain of >2 pounds or a weekly weight gain of >5 pounds - not adding salt to food and read food labels. Reviewed the importance of keeping daily sodium intake to '2000mg'$  daily. - Avoid Ibuprofen products.

## 2022-02-26 NOTE — Assessment & Plan Note (Signed)
Chronic.  Following with up with Cardiology.  Continue with Lasix.  Continue to follow their recommendations.   - Reminded to call for an overnight weight gain of >2 pounds or a weekly weight gain of >5 pounds - not adding salt to food and read food labels. Reviewed the importance of keeping daily sodium intake to '2000mg'$  daily. - Avoid Ibuprofen products.

## 2022-02-26 NOTE — Assessment & Plan Note (Signed)
Chronic.  Controlled.  Continue with current medication regimen on Lisinopril 2.'5mg'$  daily.  Labs ordered today.  Return to clinic in 3 months for reevaluation.  Call sooner if concerns arise.

## 2022-02-26 NOTE — Assessment & Plan Note (Signed)
Recommended eating smaller high protein, low fat meals more frequently and exercising 30 mins a day 5 times a week with a goal of 10-15lb weight loss in the next 3 months.  

## 2022-02-26 NOTE — Assessment & Plan Note (Signed)
Stable.  Followed by vascular surgeon.  Had repeat US which showed AAA is 2.8cm and stable from previous exam.  Continue to follow up with surgeon.

## 2022-02-26 NOTE — Assessment & Plan Note (Signed)
Chronic.  Controlled.  Continue with current medication regimen.  Labs ordered today.  Return to clinic in 6 months for reevaluation.  Call sooner if concerns arise.  ? ?

## 2022-02-26 NOTE — Assessment & Plan Note (Signed)
Chronic. Found on imaging CT 01/2017.  Recommend she continue taking statin and ASA daily for prevention.  Continue complete cessation of smoking.  Follow up in 3 months.  Call sooner if concerns arise.

## 2022-02-26 NOTE — Assessment & Plan Note (Signed)
Chronic.  Controlled.  Continue with current medication regimen.  Labs ordered today.  Return to clinic in 3 months for reevaluation.  Call sooner if concerns arise.   

## 2022-02-26 NOTE — Assessment & Plan Note (Signed)
Chronic. Cause of recent hospitalization.  Will be seeing Pulmonary HTN specialist.  On 2L baseline oxygen.  Praised for stopping.  Has not been using inhaler.  Continue to follow up with Pulmonology.

## 2022-02-27 LAB — COMPREHENSIVE METABOLIC PANEL
ALT: 13 IU/L (ref 0–32)
AST: 19 IU/L (ref 0–40)
Albumin/Globulin Ratio: 2.8 — ABNORMAL HIGH (ref 1.2–2.2)
Albumin: 4.7 g/dL (ref 3.9–4.9)
Alkaline Phosphatase: 57 IU/L (ref 44–121)
BUN/Creatinine Ratio: 24 (ref 12–28)
BUN: 22 mg/dL (ref 8–27)
Bilirubin Total: 0.4 mg/dL (ref 0.0–1.2)
CO2: 23 mmol/L (ref 20–29)
Calcium: 9.1 mg/dL (ref 8.7–10.3)
Chloride: 103 mmol/L (ref 96–106)
Creatinine, Ser: 0.9 mg/dL (ref 0.57–1.00)
Globulin, Total: 1.7 g/dL (ref 1.5–4.5)
Glucose: 96 mg/dL (ref 70–99)
Potassium: 4 mmol/L (ref 3.5–5.2)
Sodium: 142 mmol/L (ref 134–144)
Total Protein: 6.4 g/dL (ref 6.0–8.5)
eGFR: 70 mL/min/{1.73_m2} (ref >59)

## 2022-02-27 LAB — CBC WITH DIFFERENTIAL/PLATELET
Basophils Absolute: 0 10*3/uL (ref 0.0–0.2)
Basos: 1 %
EOS (ABSOLUTE): 0.4 10*3/uL (ref 0.0–0.4)
Eos: 7 %
Hematocrit: 39.2 % (ref 34.0–46.6)
Hemoglobin: 12.6 g/dL (ref 11.1–15.9)
Immature Grans (Abs): 0 10*3/uL (ref 0.0–0.1)
Immature Granulocytes: 0 %
Lymphocytes Absolute: 1 10*3/uL (ref 0.7–3.1)
Lymphs: 21 %
MCH: 28.9 pg (ref 26.6–33.0)
MCHC: 32.1 g/dL (ref 31.5–35.7)
MCV: 90 fL (ref 79–97)
Monocytes Absolute: 0.4 10*3/uL (ref 0.1–0.9)
Monocytes: 8 %
Neutrophils Absolute: 3 10*3/uL (ref 1.4–7.0)
Neutrophils: 63 %
Platelets: 155 10*3/uL (ref 150–450)
RBC: 4.36 x10E6/uL (ref 3.77–5.28)
RDW: 13.3 % (ref 11.7–15.4)
WBC: 4.9 10*3/uL (ref 3.4–10.8)

## 2022-02-27 LAB — LIPID PANEL
Chol/HDL Ratio: 4 ratio (ref 0.0–4.4)
Cholesterol, Total: 152 mg/dL (ref 100–199)
HDL: 38 mg/dL — ABNORMAL LOW (ref >39)
LDL Chol Calc (NIH): 88 mg/dL (ref 0–99)
Triglycerides: 150 mg/dL — ABNORMAL HIGH (ref 0–149)
VLDL Cholesterol Cal: 26 mg/dL (ref 5–40)

## 2022-02-27 LAB — TSH: TSH: 2.06 u[IU]/mL (ref 0.450–4.500)

## 2022-02-27 LAB — HEMOGLOBIN A1C
Est. average glucose Bld gHb Est-mCnc: 120 mg/dL
Hgb A1c MFr Bld: 5.8 % — ABNORMAL HIGH (ref 4.8–5.6)

## 2022-02-27 NOTE — Progress Notes (Signed)
Hi Brandi Steele.  It was nice to see you yesterday.  Your lab work looks good.  Your kidney function and glucose have improved greatly since you were in the hospital.  No concerns at this time. Continue with your current medication regimen.  Follow up as discussed.  Please let me know if you have any questions.

## 2022-03-03 ENCOUNTER — Telehealth: Payer: Self-pay | Admitting: Pulmonary Disease

## 2022-03-03 NOTE — Telephone Encounter (Signed)
Dr. Mortimer Fries or Dr. Patsey Berthold Are either of you okay with seeing this patient?

## 2022-03-03 NOTE — Telephone Encounter (Signed)
Since she also has OSA on CPAP recommend Dr. Mortimer Fries.

## 2022-03-03 NOTE — Telephone Encounter (Signed)
noted 

## 2022-03-03 NOTE — Telephone Encounter (Signed)
Ok with me. thanks 

## 2022-03-03 NOTE — Telephone Encounter (Signed)
Spoke to patient and scheduled appt 03/11/2022 at 9:00. She voiced her understanding.  Nothing further needed.

## 2022-03-03 NOTE — Telephone Encounter (Signed)
Dr. Silas Flood are you okay with patient making the switch to [facility]?

## 2022-03-04 ENCOUNTER — Other Ambulatory Visit: Payer: Self-pay | Admitting: Nurse Practitioner

## 2022-03-04 NOTE — Telephone Encounter (Unsigned)
Copied from [place] [id number]. Topic: General - Other >> Mar 04, 2022  1:03 PM Everette C wrote: Reason for CRM: Medication Refill - Medication: apixaban (ELIQUIS) 5 MG TABS tablet [[id number]]  Has the patient contacted their pharmacy? Yes.   (Agent: If no, request that the patient contact the pharmacy for the refill. If patient does not wish to contact the pharmacy document the reason why and proceed with request.) (Agent: If yes, when and what did the pharmacy advise?)  Preferred Pharmacy (with phone number or street name): [facility] [place], [place] - [place] [address] [place] [address] Phone: [phone] Fax: [phone] Hours: Not open 24 hours   Has the patient been seen for an appointment in the last year OR does the patient have an upcoming appointment? Yes.    Agent: Please be advised that RX refills may take up to 3 business days. We ask that you follow-up with your pharmacy.

## 2022-03-05 MED ORDER — APIXABAN 5 MG PO TABS: 5.0000 mg | ORAL_TABLET | Freq: Two times a day (BID) | ORAL | 1 refills | Status: DC

## 2022-03-05 NOTE — Telephone Encounter (Signed)
Requested medication (s) are due for refill today yes  Requested medication (s) are on the active medication list -yes  Future visit scheduled -yes  Last refill: 02/24/22  Notes to clinic: listed as historical provider- request sent for review   Requested Prescriptions  Pending Prescriptions Disp Refills   apixaban (ELIQUIS) 5 MG TABS tablet 60 tablet     Sig: Take 1 tablet (5 mg total) by mouth 2 (two) times daily.     Hematology:  Anticoagulants - apixaban Passed - 03/04/2022  3:01 PM      Passed - PLT in normal range and within 360 days    Platelets  Date Value Ref Range Status  02/26/2022 155 150 - 450 x10E3/uL Final         Passed - HGB in normal range and within 360 days    Hemoglobin  Date Value Ref Range Status  02/26/2022 12.6 11.1 - 15.9 g/dL Final         Passed - HCT in normal range and within 360 days    Hematocrit  Date Value Ref Range Status  02/26/2022 39.2 34.0 - 46.6 % Final         Passed - Cr in normal range and within 360 days    Creatinine  Date Value Ref Range Status  06/16/2013 0.63 0.60 - 1.30 mg/dL Final   Creatinine, Ser  Date Value Ref Range Status  02/26/2022 0.90 0.57 - 1.00 mg/dL Final         Passed - AST in normal range and within 360 days    AST  Date Value Ref Range Status  02/26/2022 19 0 - 40 IU/L Final   SGOT(AST)  Date Value Ref Range Status  06/16/2013 17 15 - 37 Unit/L Final         Passed - ALT in normal range and within 360 days    ALT  Date Value Ref Range Status  02/26/2022 13 0 - 32 IU/L Final   SGPT (ALT)  Date Value Ref Range Status  06/16/2013 17 12 - 78 U/L Final         Passed - Valid encounter within last 12 months    Recent Outpatient Visits           1 week ago Annual physical exam   [place] Jon Billings, NP   3 months ago Chronic congestive heart failure, unspecified heart failure type [facility])   [place] Jon Billings, NP   6  months ago Mixed hyperlipidemia   [place] Jon Billings, NP   8 months ago Acute low back pain, unspecified back pain laterality, unspecified whether sciatica present   [place] Jon Billings, NP   9 months ago Essential hypertension   [place], Karen, NP       Future Appointments             In 6 days Flora Lipps, MD [facility] [place]   In 2 months Jon Billings, NP [place], [facility]               Requested Prescriptions  Pending Prescriptions Disp Refills   apixaban (ELIQUIS) 5 MG TABS tablet 60 tablet     Sig: Take 1 tablet (5 mg total) by mouth 2 (two) times daily.     Hematology:  Anticoagulants - apixaban Passed - 03/04/2022  3:01 PM      Passed - PLT in normal range and within 360 days    Platelets  Date Value Ref Range Status  02/26/2022 155 150 - 450 x10E3/uL Final         Passed - HGB in normal range and within 360 days    Hemoglobin  Date Value Ref Range Status  02/26/2022 12.6 11.1 - 15.9 g/dL Final         Passed - HCT in normal range and within 360 days    Hematocrit  Date Value Ref Range Status  02/26/2022 39.2 34.0 - 46.6 % Final         Passed - Cr in normal range and within 360 days    Creatinine  Date Value Ref Range Status  06/16/2013 0.63 0.60 - 1.30 mg/dL Final   Creatinine, Ser  Date Value Ref Range Status  02/26/2022 0.90 0.57 - 1.00 mg/dL Final         Passed - AST in normal range and within 360 days    AST  Date Value Ref Range Status  02/26/2022 19 0 - 40 IU/L Final   SGOT(AST)  Date Value Ref Range Status  06/16/2013 17 15 - 37 Unit/L Final         Passed - ALT in normal range and within 360 days    ALT  Date Value Ref Range Status  02/26/2022 13 0 - 32 IU/L Final   SGPT (ALT)  Date Value Ref Range Status  06/16/2013 17 12 - 78 U/L Final         Passed  - Valid encounter within last 12 months    Recent Outpatient Visits           1 week ago Annual physical exam   [place], NP   3 months ago Chronic congestive heart failure, unspecified heart failure type [facility])   [place] Jon Billings, NP   6 months ago Mixed hyperlipidemia   [place] Jon Billings, NP   8 months ago Acute low back pain, unspecified back pain laterality, unspecified whether sciatica present   [place], NP   9 months ago Essential hypertension   [place] Jon Billings, NP       Future Appointments             In 6 days Flora Lipps, MD [facility] [place]   In 2 months Jon Billings, NP [place], [facility]

## 2022-03-10 ENCOUNTER — Telehealth: Payer: Self-pay

## 2022-03-10 NOTE — Telephone Encounter (Signed)
Created in error

## 2022-03-10 NOTE — Telephone Encounter (Signed)
Spoke to patient and confirmed that she wears cpap nightly. Download in [place] stops 01/2021. Spoke to [place] with [employer]. He stated that machine is older. Patient will need to contact [employer] to schedule appt for download.

## 2022-03-11 ENCOUNTER — Encounter: Payer: Self-pay | Admitting: Internal Medicine

## 2022-03-11 ENCOUNTER — Ambulatory Visit (INDEPENDENT_AMBULATORY_CARE_PROVIDER_SITE_OTHER): Payer: 59 | Admitting: Internal Medicine

## 2022-03-11 ENCOUNTER — Ambulatory Visit: Payer: 59 | Admitting: Internal Medicine

## 2022-03-11 VITALS — BP 140/70 | HR 96 | Temp 97.8°F | Ht 65.0 in | Wt 194.8 lb

## 2022-03-11 DIAGNOSIS — J9611 Chronic respiratory failure with hypoxia: Secondary | ICD-10-CM | POA: Diagnosis not present

## 2022-03-11 DIAGNOSIS — I272 Pulmonary hypertension, unspecified: Secondary | ICD-10-CM | POA: Diagnosis not present

## 2022-03-11 DIAGNOSIS — G4733 Obstructive sleep apnea (adult) (pediatric): Secondary | ICD-10-CM | POA: Diagnosis not present

## 2022-03-11 DIAGNOSIS — J449 Chronic obstructive pulmonary disease, unspecified: Secondary | ICD-10-CM | POA: Diagnosis not present

## 2022-03-11 MED ORDER — TRELEGY ELLIPTA 200-62.5-25 MCG/ACT IN AEPB: 1.0000 | INHALATION_SPRAY | Freq: Every day | RESPIRATORY_TRACT | 6 refills | Status: DC

## 2022-03-11 NOTE — Patient Instructions (Signed)
Excellent job with CPAP A+  Stop Symbicort Start Trelegy inhaler  Continue oxygen as prescribed   Avoid secondhand smoke Avoid SICK contacts Recommend  Masking  when appropriate Recommend Keep up-to-date with vaccinations

## 2022-03-11 NOTE — Progress Notes (Signed)
$@PatientA$  ID: Brandi Steele, female    DOB: Apr 11, 1955, 67 y.o.   MRN: [id number]  Chief Complaint  Patient presents with   Follow-up    Occ SOB with exertion/incline. Wearing cpap nightly-unsure if pressure and mask are okay.    Follow-up OSA Follow-up PE Follow-up pulmonary hypertension Follow-up COPD  HPI:  Regarding OSA Patient has excellent compliance report Patient uses and benefits from therapy AHI significantly reduced  Regarding pulmonary hypertension Patient currently on anticoagulation for diagnosis of PE Patient does also have COPD Patient sees Dr. Sung Amabile with cardiology At this time there is no indication for vasodilator therapy Patient has underlying OSA COPD and PE  Chronic hypoxic respiratory failure Continue oxygen as prescribed Patient uses and benefits from therapy  Regarding COPD Patient currently on Symbicort but does not seem to be helping Will switch to Trelegy inhaler therapy     Cardiac History  TTE obtained through the [facility] system demonstrates normal LV systolic function, grade 2 diastolic dysfunction, dilated left atrium, dilated right atrium, mildly reduced RV function with enlarged RV size and estimated elevated RVSP.  Compared to most recent echocardiogram 03/2021 in the [place] system demonstrates similarly mildly enlarged RV with newly reduced RV function, similar LA dilation, only grade 1 diastolic dysfunction at that time.  At last visit she was prescribed oxygen therapy given signs of hypoxemia with walking.  She reports good adherence to this.   CTA PE protocol same day personally reviewed with mild emphysematous changes, stable 2 mm right lower lobe nodule on my interpretation. CT chest January 2024  few subsegmental branches in right upper lobe and right lower lobe. Findings suggest pulmonary embolism with minimal thrombus burden.  PMH: Anxiety, GERD, CAD, hyperlipidemia Surgical history: Hysterectomy Family history:  Father with CAD, with CAD, CVA Social history: Former smoker, 40-pack-year, reportedly quit 2019, lives in [place]    Lab Results: Personally reviewed CBC    Component Value Date/Time   WBC 4.9 02/26/2022 0903   WBC 10.4 02/07/2022 0301   RBC 4.36 02/26/2022 0903   RBC 4.55 02/07/2022 0301   HGB 12.6 02/26/2022 0903   HCT 39.2 02/26/2022 0903   PLT 155 02/26/2022 0903   MCV 90 02/26/2022 0903   MCV 93 06/16/2013 1946   MCH 28.9 02/26/2022 0903   MCH 29.0 02/07/2022 0301   MCHC 32.1 02/26/2022 0903   MCHC 32.2 02/07/2022 0301   RDW 13.3 02/26/2022 0903   RDW 15.0 (H) 06/16/2013 1946   LYMPHSABS 1.0 02/26/2022 0903   LYMPHSABS 2.4 06/16/2013 1946   MONOABS 0.5 06/27/2021 1213   MONOABS 0.6 06/16/2013 1946   EOSABS 0.4 02/26/2022 0903   EOSABS 0.1 06/16/2013 1946   BASOSABS 0.0 02/26/2022 0903   BASOSABS 0.1 06/16/2013 1946    BMET    Component Value Date/Time   NA 142 02/26/2022 0903   NA 143 06/16/2013 1946   K 4.0 02/26/2022 0903   K 4.0 06/16/2013 1946   CL 103 02/26/2022 0903   CL 103 06/16/2013 1946   CO2 23 02/26/2022 0903   CO2 31 06/16/2013 1946   GLUCOSE 96 02/26/2022 0903   GLUCOSE 164 (H) 02/07/2022 0301   GLUCOSE 93 06/16/2013 1946   BUN 22 02/26/2022 0903   BUN 13 06/16/2013 1946   CREATININE 0.90 02/26/2022 0903   CREATININE 0.63 06/16/2013 1946   CALCIUM 9.1 02/26/2022 0903   CALCIUM 9.0 06/16/2013 1946   GFRNONAA >60 02/07/2022 0301   GFRNONAA >60 06/16/2013 1946  GFRAA 106 12/08/2019 1020   GFRAA >60 06/16/2013 1946    BNP    Component Value Date/Time   BNP 108.5 (H) 02/02/2022 1359    ProBNP No results found for: "PROBNP"  Specialty Problems       Pulmonary Problems   Centrilobular emphysema (HCC)   Sleep apnea   Acute on chronic respiratory failure with hypoxia (HCC)   COPD with acute exacerbation (HCC)    Allergies  Allergen Reactions   Morphine And Related Nausea And Vomiting   Celexa [Citalopram Hydrobromide] Other  (See Comments)    Pruritis    Effexor [Venlafaxine] Other (See Comments)    Panic attack   Lipitor [Atorvastatin] Other (See Comments)    Myalgias   Shellfish Allergy Hives and Swelling   Wellbutrin [Bupropion] Anxiety    Immunization History  Administered Date(s) Administered   Fluad Quad(high Dose 65+) 11/17/2018   Influenza,inj,Quad PF,6+ Mos 10/09/2015, 12/26/2016   Influenza-Unspecified 11/22/2017   Pneumococcal Conjugate-13 03/03/2019   Pneumococcal Polysaccharide-23 03/28/2009   Td 06/14/2004   Tdap 05/28/2015    Past Medical History:  Diagnosis Date   Anxiety    Aortic stenosis    CHF (congestive heart failure) (HCC)    COPD (chronic obstructive pulmonary disease) (HCC)    Depression    Emphysema of lung (HCC)    Fibromyalgia    GERD (gastroesophageal reflux disease)    Hyperlipidemia    Hypertension    Hypertensive heart disease    Myalgia    Obesity    Sciatica    Seborrheic keratosis    Stress incontinence     Tobacco History: Social History   Tobacco Use  Smoking Status Former   Packs/day: 1.00   Years: 40.00   Total pack years: 40.00   Types: Cigarettes   Quit date: 11/01/2016   Years since quitting: 5.3  Smokeless Tobacco Never  Tobacco Comments   Is trying cold-turkey with social support   Counseling given: Not Answered Tobacco comments: Is trying cold-turkey with social support   Continue to not smoke  Outpatient Encounter Medications as of 03/11/2022  Medication Sig   albuterol (VENTOLIN HFA) 108 (90 Base) MCG/ACT inhaler INHALE 2 PUFFS INTO THE LUNGS EVERY 6 HOURS AS NEEDED FOR WHEEZING OR SHORTNESS OF BREATH   ALPRAZolam (XANAX) 0.5 MG tablet Take 1 tablet (0.5 mg total) by mouth 3 (three) times daily as needed for anxiety.   apixaban (ELIQUIS) 5 MG TABS tablet Take 1 tablet (5 mg total) by mouth 2 (two) times daily.   budesonide-formoterol (SYMBICORT) 160-4.5 MCG/ACT inhaler Inhale 2 puffs into the lungs 2 (two) times daily.    Calcium Carbonate-Vit D-Min (CALCIUM 1200 PO) Take by mouth daily.   cetirizine (ZYRTEC) 10 MG tablet Take 1 tablet (10 mg total) by mouth daily.   empagliflozin (JARDIANCE) 10 MG TABS tablet Take 1 tablet (10 mg total) by mouth daily before breakfast.   esomeprazole (NEXIUM) 40 MG capsule TAKE ONE CAPSULE BY MOUTH TWICE DAILY   furosemide (LASIX) 40 MG tablet TAKE 1 TABLET(40 MG) BY MOUTH DAILY AS NEEDED   hydrOXYzine (ATARAX) 25 MG tablet TAKE 1 TABLET(25 MG) BY MOUTH THREE TIMES DAILY AS NEEDED   lisinopril (ZESTRIL) 2.5 MG tablet TAKE 1 TABLET(2.5 MG) BY MOUTH DAILY   Magnesium 400 MG CAPS Take by mouth. Take 4 tablets daily   meloxicam (MOBIC) 15 MG tablet TAKE 1 TABLET(15 MG) BY MOUTH DAILY   Potassium Gluconate 595 MG CAPS Take by mouth. Take 4  capsules daily   rosuvastatin (CRESTOR) 20 MG tablet Take 1 tablet (20 mg total) by mouth daily.   triamcinolone cream (KENALOG) 0.1 % Apply 1 application. topically 2 (two) times daily.   Facility-Administered Encounter Medications as of 03/11/2022  Medication   albuterol (PROVENTIL) (2.5 MG/3ML) 0.083% nebulizer solution 2.5 mg     BP (!) 140/70 (BP Location: Left Arm, Cuff Size: Large)   Pulse 96   Temp 97.8 F (36.6 C) (Temporal)   Ht 5' 5"$  (1.651 m)   Wt 194 lb 12.8 oz (88.4 kg)   LMP  (LMP Unknown)   SpO2 90%   BMI 32.42 kg/m     Assessment & Plan:   67 year old pleasant white female seen today for multiple pulmonary issues including chronic hypoxic respiratory failure in the setting of pulmonary hypertension with moderate COPD in the setting of sleep apnea with  obesity and deconditioned state   COPD Stable at this time however Symbicort does not seem to be helping as much Will change to Trelegy inhaler therapy Albuterol as needed   Pulmonary hypertension Multifactorial causes including COPD OSA and recently diagnosed PE At this time I do not recommend vasodilator therapy however we will continue anticoagulation therapy  for COPD and continue oxygen as prescribed as well as continuing CPAP therapy for OSA  Chronic Hypoxic resp failure due to COPD -Patient benefits from oxygen therapy 2L [place] with exertion -recommend using oxygen as prescribed -patient needs this for survival  OSA on CPAP Compliance report reviewed in detail with patient Excellent compliance AHI reduced Benefits and needs this for survival   Obesity -recommend significant weight loss -recommend changing diet  Deconditioned state -Recommend increased daily activity and exercise    MEDICATION ADJUSTMENTS/LABS AND TESTS ORDERED: Stop Symbicort Start Trelegy inhaler Continue oxygen as prescribed Avoid secondhand smoke Avoid SICK contacts Recommend  Masking  when appropriate Recommend Keep up-to-date with vaccinations   CURRENT MEDICATIONS REVIEWED AT LENGTH WITH PATIENT TODAY   Patient  satisfied with Plan of action and management. All questions answered  Follow up 1 year  Total Time Spent  38 mins   Ivett Luebbe Patricia Pesa, M.D.  [facility]  Medical Director [place] Director [facility] Cardio-Pulmonary Department

## 2022-05-06 ENCOUNTER — Other Ambulatory Visit: Payer: Self-pay | Admitting: Nurse Practitioner

## 2022-05-07 NOTE — Telephone Encounter (Signed)
Requested Prescriptions  Pending Prescriptions Disp Refills   furosemide (LASIX) 40 MG tablet [Pharmacy Med Name: FUROSEMIDE 40MG  TABLETS] 30 tablet 2    Sig: TAKE 1 TABLET(40 MG) BY MOUTH DAILY AS NEEDED     Cardiovascular:  Diuretics - Loop Failed - 05/06/2022  7:02 AM      Failed - Last BP in normal range    BP Readings from Last 1 Encounters:  03/11/22 (!) 140/70         Passed - K in normal range and within 180 days    Potassium  Date Value Ref Range Status  02/26/2022 4.0 3.5 - 5.2 mmol/L Final  06/16/2013 4.0 3.5 - 5.1 mmol/L Final         Passed - Ca in normal range and within 180 days    Calcium  Date Value Ref Range Status  02/26/2022 9.1 8.7 - 10.3 mg/dL Final   Calcium, Total  Date Value Ref Range Status  06/16/2013 9.0 8.5 - 10.1 mg/dL Final         Passed - Na in normal range and within 180 days    Sodium  Date Value Ref Range Status  02/26/2022 142 134 - 144 mmol/L Final  06/16/2013 143 136 - 145 mmol/L Final         Passed - Cr in normal range and within 180 days    Creatinine  Date Value Ref Range Status  06/16/2013 0.63 0.60 - 1.30 mg/dL Final   Creatinine, Ser  Date Value Ref Range Status  02/26/2022 0.90 0.57 - 1.00 mg/dL Final         Passed - Cl in normal range and within 180 days    Chloride  Date Value Ref Range Status  02/26/2022 103 96 - 106 mmol/L Final  06/16/2013 103 98 - 107 mmol/L Final         Passed - Mg Level in normal range and within 180 days    Magnesium  Date Value Ref Range Status  02/06/2022 2.3 1.7 - 2.4 mg/dL Final    Comment:    Performed at [facility], [address] [place], [place] [address]  06/16/2013 1.4 (L) mg/dL Final    Comment:    3.1-5.1 THERAPEUTIC RANGE: 4-7 mg/dL TOXIC: > 10 mg/dL  -----------------------          Passed - Valid encounter within last 6 months    Recent Outpatient Visits           2 months ago Annual physical exam   [place] [facility]  Larae Grooms, NP   5 months ago Chronic congestive heart failure, unspecified heart failure type [facility])   [place] [facility] Larae Grooms, NP   8 months ago Mixed hyperlipidemia   [place] [facility] Larae Grooms, NP   10 months ago Acute low back pain, unspecified back pain laterality, unspecified whether sciatica present   [place] [facility] Larae Grooms, NP   11 months ago Essential hypertension   [place] [facility] Larae Grooms, NP       Future Appointments             Tomorrow Shea Evans, Raymon Mutton, PA-C [place] [facility] [place]   In 2 weeks Larae Grooms, NP [place] [facility], [facility]             lisinopril (ZESTRIL) 2.5 MG tablet [Pharmacy Med Name: LISINOPRIL 2.5MG  TABLETS] 90 tablet  0    Sig: TAKE 1 TABLET(2.5 MG) BY MOUTH DAILY     Cardiovascular:  ACE Inhibitors Failed - 05/06/2022  7:02 AM      Failed - Last BP in normal range    BP Readings from Last 1 Encounters:  03/11/22 (!) 140/70         Passed - Cr in normal range and within 180 days    Creatinine  Date Value Ref Range Status  06/16/2013 0.63 0.60 - 1.30 mg/dL Final   Creatinine, Ser  Date Value Ref Range Status  02/26/2022 0.90 0.57 - 1.00 mg/dL Final         Passed - K in normal range and within 180 days    Potassium  Date Value Ref Range Status  02/26/2022 4.0 3.5 - 5.2 mmol/L Final  06/16/2013 4.0 3.5 - 5.1 mmol/L Final         Passed - Patient is not pregnant      Passed - Valid encounter within last 6 months    Recent Outpatient Visits           2 months ago Annual physical exam   [place] [facility] Larae Grooms, NP   5 months ago Chronic congestive heart failure, unspecified heart failure type [facility])   [place] [facility] Larae Grooms, NP   8 months ago Mixed hyperlipidemia   [place] [facility] Larae Grooms, NP   10 months ago Acute low back pain, unspecified back pain laterality, unspecified whether sciatica present   [place] [facility] Larae Grooms, NP   11 months ago Essential hypertension   [place] [facility] Larae Grooms, NP       Future Appointments             Tomorrow [place], Raymon Mutton, PA-C  [facility] at [place]   In 2 weeks Larae Grooms, NP  [facility], [facility]

## 2022-05-07 NOTE — Progress Notes (Unsigned)
Cardiology Office Note:    Date:  05/08/2022   ID:  Brandi Steele, DOB April 16, 1955, MRN [id number]  PCP:  Larae Grooms, NP   [facility] Providers Cardiologist:  Debbe Odea, MD     Referring MD: Larae Grooms, NP   CC: follow up for CAD  History of Present Illness:    Brandi Steele is a 67 y.o. female with a hx of CHF, hypertension, CAD noted on CT imaging, AAA repair ~ 20 years ago, carotid artery stenosis, pulmonary hypertension, emphysema with chronic hypoxic respiratory failure on supplemental oxygen, OSA on CPAP, GERD, fibromyalgia, hyperlipidemia.  Echo in 2015 demonstrated an biatrial enlargement with enlarged RV and normal RV systolic function.  Echo was similar in 2018.  RVSP not commented on.  Echo at [facility] in 03/2021 demonstrated an EF greater than 55% with moderate tricuspid regurgitation and severe pulmonary hypertension with a PASP of 74 mmHg.  Treadmill stress test in 03/2021 showed no ischemic EKG changes.   Previously evaluated and managed by [facility] clinic cardiology.  She establish care with [facility] in June 2023 with Dr. Azucena Cecil. At that time she was doing well from a cardiac perspective. Echo at that time revealed an EF of 60 to 65%, grade 2 DD, severely elevated PASP at 72.4 mmHg, biatrial dilatation.  She followed up with pulmonology shortly after this with symptoms related to pulmonary hypertension, underwent a right heart cath which revealed moderate pulmonary hypertension, mixed arterial and venous etiology.  She was admitted on 02/02/2022 to 02/07/2022 for acute on chronic diastolic heart failure.  BNP was minimally elevated at 100, CXR showed diffuse interstitial opacities suggesting of edema.  She was diuresed with IV Lasix.  On 1/11 she began requiring increased oxygen demands, D-dimer was elevated, CTA showed multiple tiny defects consistent with pulmonary emboli.  Lower extremity duplex ultrasound was negative for DVTs bilaterally.   She was started on IV heparin and then transitioned to Eliquis at discharge.  Her Plavix was held to minimize bleeding risk.  Echo in January 2024 revealed an EF 60 to 65%, mild LVH, tricuspid regurgitation signal was inadequate for assessing PA pressure.  Carotid ultrasound on 02/20/2022 revealed bilateral minimal stenosis.  She presents today for follow-up of her CAD and heart failure.  She becomes very tearful and begins to cry explaining the amount of stress that she has been under recently.  Her husband has been diagnosed with metastatic breast cancer and is currently under hospice services.  She is also the primary caregiver for younger brother who is developmentally delayed.  Aside from her personal stressors, she is doing stable from a cardiac perspective.  Her breathing is currently managed, and appears to be at her new baseline.  She is on oxygen at night with her CPAP, and brings a portable tank when she is exerting herself.  She states a few weeks ago she began to develop severe cramping in her legs and in her hands, she was wondering if this was related to her statin therapy, she did speak about this with the pharmacist and they advised her to try co-Q10 which she is taken 2 times now and has noticed that the cramping has subsided. She denies chest pain, palpitations, dyspnea, pnd, orthopnea, n, v, dizziness, syncope, edema, weight gain, or early satiety.    Past Medical History:  Diagnosis Date   Anxiety    Aortic stenosis    CHF (congestive heart failure)    COPD (chronic obstructive pulmonary disease)  Depression    Emphysema of lung    Fibromyalgia    GERD (gastroesophageal reflux disease)    Hyperlipidemia    Hypertension    Hypertensive heart disease    Myalgia    Obesity    Sciatica    Seborrheic keratosis    Stress incontinence     Past Surgical History:  Procedure Laterality Date   RIGHT HEART CATH N/A 10/07/2021   Procedure: RIGHT HEART CATH;  Surgeon: Iran Ouch, MD;  Location: [facility] INVASIVE CV LAB;  Service: Cardiovascular;  Laterality: N/A;   TONSILLECTOMY     TOTAL ABDOMINAL HYSTERECTOMY     partial    Current Medications: Current Meds  Medication Sig   albuterol (VENTOLIN HFA) 108 (90 Base) MCG/ACT inhaler INHALE 2 PUFFS INTO THE LUNGS EVERY 6 HOURS AS NEEDED FOR WHEEZING OR SHORTNESS OF BREATH   ALPRAZolam (XANAX) 0.5 MG tablet Take 1 tablet (0.5 mg total) by mouth 3 (three) times daily as needed for anxiety.   apixaban (ELIQUIS) 5 MG TABS tablet Take 1 tablet (5 mg total) by mouth 2 (two) times daily.   budesonide-formoterol (SYMBICORT) 160-4.5 MCG/ACT inhaler Inhale 2 puffs into the lungs 2 (two) times daily.   Calcium Carbonate-Vit D-Min (CALCIUM 1200 PO) Take by mouth daily.   cetirizine (ZYRTEC) 10 MG tablet Take 1 tablet (10 mg total) by mouth daily. (Patient taking differently: Take 10 mg by mouth as needed.)   Coenzyme Q10 (CO Q 10 PO) Take 2 capsules by mouth every morning.   empagliflozin (JARDIANCE) 10 MG TABS tablet Take 1 tablet (10 mg total) by mouth daily before breakfast.   esomeprazole (NEXIUM) 40 MG capsule TAKE ONE CAPSULE BY MOUTH TWICE DAILY (Patient taking differently: 40 mg daily. TAKE ONE CAPSULE BY MOUTH TWICE DAILY)   furosemide (LASIX) 40 MG tablet TAKE 1 TABLET(40 MG) BY MOUTH DAILY AS NEEDED   hydrOXYzine (ATARAX) 25 MG tablet TAKE 1 TABLET(25 MG) BY MOUTH THREE TIMES DAILY AS NEEDED (Patient taking differently: 1 tablet at night when can't sleep)   lisinopril (ZESTRIL) 2.5 MG tablet TAKE 1 TABLET(2.5 MG) BY MOUTH DAILY   Magnesium 400 MG CAPS Take 550 mg by mouth. 2 in the AM and 2 in the PM   meloxicam (MOBIC) 15 MG tablet TAKE 1 TABLET(15 MG) BY MOUTH DAILY   Potassium Gluconate 595 MG CAPS Take 400 mg by mouth in the morning, at noon, in the evening, and at bedtime. Take 4 capsules daily   rosuvastatin (CRESTOR) 20 MG tablet Take 1 tablet (20 mg total) by mouth daily.   triamcinolone cream (KENALOG)  0.1 % Apply 1 application. topically 2 (two) times daily.     Allergies:   Morphine and related, Celexa [citalopram hydrobromide], Effexor [venlafaxine], Lipitor [atorvastatin], Shellfish allergy, and Wellbutrin [bupropion]   Social History   Socioeconomic History   Marital status: Married    Spouse name: Not on file   Number of children: Not on file   Years of education: Not on file   Highest education level: Not on file  Occupational History   Not on file  Tobacco Use   Smoking status: Former    Packs/day: 1.00    Years: 40.00    Additional pack years: 0.00    Total pack years: 40.00    Types: Cigarettes    Quit date: 11/01/2016    Years since quitting: 5.5   Smokeless tobacco: Never   Tobacco comments:    Is trying cold-turkey with  social support  Vaping Use   Vaping Use: Former   Start date: 12/02/2016   Quit date: 09/06/2021  Substance and Sexual Activity   Alcohol use: No    Alcohol/week: 0.0 standard drinks of alcohol   Drug use: No   Sexual activity: Not Currently  Other Topics Concern   Not on file  Social History Narrative   Not on file   Social Determinants of Health   Financial Resource Strain: Low Risk  (10/21/2021)   Overall Financial Resource Strain (CARDIA)    Difficulty of Paying Living Expenses: Not hard at all  Food Insecurity: No Food Insecurity (02/03/2022)   Hunger Vital Sign    Worried About Running Out of Food in the Last Year: Never true    Ran Out of Food in the Last Year: Never true  Transportation Needs: No Transportation Needs (02/03/2022)   PRAPARE - Administrator, Civil Service (Medical): No    Lack of Transportation (Non-Medical): No  Physical Activity: Insufficiently Active (10/21/2021)   Exercise Vital Sign    Days of Exercise per Week: 3 days    Minutes of Exercise per Session: 30 min  Stress: Stress Concern Present (10/21/2021)   [employer] of Occupational Health - Occupational Stress Questionnaire    Feeling of  Stress : To some extent  Social Connections: Moderately Integrated (10/21/2021)   Social Connection and Isolation Panel [NHANES]    Frequency of Communication with Friends and Family: More than three times a week    Frequency of Social Gatherings with Friends and Family: Three times a week    Attends Religious Services: More than 4 times per year    Active Member of Clubs or Organizations: No    Attends Banker Meetings: Never    Marital Status: Married     Family History: [name] family history includes Diabetes in her brother; Heart disease in her father, paternal grandfather, and paternal grandmother; Hypertension in her mother; Stroke in her maternal grandfather, maternal grandmother, and mother. There is no history of Breast cancer.  ROS:   Please see the history of present illness.    All other systems reviewed and are negative.  EKGs/Labs/Other Studies Reviewed:    The following studies were reviewed today:  Cardiac Studies & Procedures   CARDIAC CATHETERIZATION  CARDIAC CATHETERIZATION 10/07/2021  Narrative Successful right heart catheterization via the right antecubital vein.  Normal RA pressure, mildly elevated wedge pressure, moderate pulmonary hypertension and normal cardiac output.  RA: 5 mmHg RV: 52/1 mmHg PCW: 18 mmHg PA: 56/24 with a mean of 35 mmHg.  Pulmonary vascular resistance: 2.56 Woods units PA sat: 68% Aortic sat: 92% Cardiac output: 6.63 with an index of 3.45.  Recommendations: Pulmonary hypertension is moderate and seems to be of a mixed arterial and venous etiology.     ECHOCARDIOGRAM  ECHOCARDIOGRAM COMPLETE 02/06/2022  Narrative ECHOCARDIOGRAM REPORT    Patient Name:   MARINA BOERNER [facility] Date of Exam: 02/06/2022 Medical Rec #:  [id number]       Height:       65.0 in Accession #:    [id number]      Weight:       184.3 lb Date of Birth:  06-Mar-1955       BSA:          1.911 m Patient Age:    66 years        BP:  120/84 mmHg Patient Gender: F               HR:           70 bpm. Exam Location:  [facility]  Procedure: 2D Echo, Cardiac Doppler and Color Doppler  Indications:     Pulmonary embolus I26.09  History:         Patient has prior history of Echocardiogram examinations, most recent 08/13/2021. COPD; Risk Factors:Hypertension.  Sonographer:     Cristela Blue Referring Phys:  [id number] Tresa Endo A GRIFFITH Diagnosing Phys: Julien Nordmann MD   Sonographer Comments: Image acquisition challenging due to COPD and The only obtainable view was subcostal. IMPRESSIONS   1. Left ventricular ejection fraction, by estimation, is 60 to 65%. The left ventricle has normal function. The left ventricle has no regional wall motion abnormalities. There is mild left ventricular hypertrophy. Left ventricular diastolic parameters are indeterminate. 2. Right ventricular systolic function is low normal. The right ventricular size is mildly enlarged. Tricuspid regurgitation signal is inadequate for assessing PA pressure. 3. The mitral valve is normal in structure. No evidence of mitral valve regurgitation. No evidence of mitral stenosis. 4. The aortic valve is normal in structure. Aortic valve regurgitation is not visualized. Aortic valve sclerosis is present, with no evidence of aortic valve stenosis. 5. The inferior vena cava is normal in size with greater than 50% respiratory variability, suggesting right atrial pressure of 3 mmHg.  FINDINGS Left Ventricle: Left ventricular ejection fraction, by estimation, is 60 to 65%. The left ventricle has normal function. The left ventricle has no regional wall motion abnormalities. The left ventricular internal cavity size was normal in size. There is mild left ventricular hypertrophy. Left ventricular diastolic parameters are indeterminate.  Right Ventricle: The right ventricular size is mildly enlarged. No increase in right ventricular wall thickness. Right ventricular systolic function  is low normal. Tricuspid regurgitation signal is inadequate for assessing PA pressure.  Left Atrium: Left atrial size was normal in size.  Right Atrium: Right atrial size was normal in size.  Pericardium: There is no evidence of pericardial effusion.  Mitral Valve: The mitral valve is normal in structure. No evidence of mitral valve regurgitation. No evidence of mitral valve stenosis.  Tricuspid Valve: The tricuspid valve is normal in structure. Tricuspid valve regurgitation is not demonstrated. No evidence of tricuspid stenosis.  Aortic Valve: The aortic valve is normal in structure. Aortic valve regurgitation is not visualized. Aortic valve sclerosis is present, with no evidence of aortic valve stenosis.  Pulmonic Valve: The pulmonic valve was normal in structure. Pulmonic valve regurgitation is not visualized. No evidence of pulmonic stenosis.  Aorta: The aortic root is normal in size and structure.  Venous: The inferior vena cava is normal in size with greater than 50% respiratory variability, suggesting right atrial pressure of 3 mmHg.  IAS/Shunts: No atrial level shunt detected by color flow Doppler.   LEFT VENTRICLE PLAX 2D LVIDd:         3.90 cm LVIDs:         2.50 cm LV PW:         1.30 cm LV IVS:        1.40 cm   Julien Nordmann MD Electronically signed by Julien Nordmann MD Signature Date/Time: 02/06/2022/5:43:27 PM    Final              EKG:  EKG is  ordered today.  The ekg ordered today demonstrates SB, RBBB, HR 58 bpm, consistent with  prior EKG tracings.   Recent Labs: 02/02/2022: B Natriuretic Peptide 108.5 02/06/2022: Magnesium 2.3 02/26/2022: ALT 13; BUN 22; Creatinine, Ser 0.90; Hemoglobin 12.6; Platelets 155; Potassium 4.0; Sodium 142; TSH 2.060  Recent Lipid Panel    Component Value Date/Time   CHOL 152 02/26/2022 0903   CHOL 142 01/05/2015 1105   TRIG 150 (H) 02/26/2022 0903   TRIG 138 01/05/2015 1105   HDL 38 (L) 02/26/2022 0903   CHOLHDL 4.0  02/26/2022 0903   CHOLHDL 4.0 11/25/2021 0801   VLDL 29 11/25/2021 0801   VLDL 28 01/05/2015 1105   LDLCALC 88 02/26/2022 0903     Risk Assessment/Calculations:                Physical Exam:    VS:  BP 130/70 (BP Location: Left Arm, Patient Position: Sitting, Cuff Size: Large)   Pulse (!) 58   Ht 5\' 6"  (1.676 m)   Wt 192 lb 9.6 oz (87.4 kg)   LMP  (LMP Unknown)   SpO2 98%   BMI 31.09 kg/m     Wt Readings from Last 3 Encounters:  05/08/22 192 lb 9.6 oz (87.4 kg)  03/11/22 194 lb 12.8 oz (88.4 kg)  02/26/22 193 lb 9.6 oz (87.8 kg)     GEN:  Appears stated age, tearful, no acute distress HEENT: Normal NECK: No JVD; No carotid bruits LYMPHATICS: No lymphadenopathy CARDIAC: RRR, no murmurs, rubs, gallops RESPIRATORY:  Clear to auscultation without rales, wheezing or rhonchi  ABDOMEN: Soft, non-tender, non-distended MUSCULOSKELETAL:  No edema; No deformity  SKIN: Warm and dry NEUROLOGIC:  Alert and oriented x 3 PSYCHIATRIC:  Normal affect   ASSESSMENT:    1. Coronary artery disease involving native coronary artery of native heart without angina pectoris   2. Pulmonary hypertension, unspecified   3. Chronic diastolic heart failure   4. Primary hypertension   5. Pulmonary embolism, other, unspecified chronicity, unspecified whether acute cor pulmonale present   6. Class 2 severe obesity due to excess calories with serious comorbidity in adult, unspecified BMI   7. CAD in native artery   8. Hyperlipidemia LDL goal <70    PLAN:    In order of problems listed above:  Coronary artery disease -noted on CT imaging; previous ischemic evaluation in 2015 was negative. Stable with no anginal symptoms. No indication for ischemic evaluation.  Previously on Plavix for AAA repair, however was stopped at most recent hospitalization d/t OAC for PE. Once DOAC stopped per PCP, will need to be restarted on Plavix per below.   Pulmonary hypertension/OSA - breathing is at baseline  today, using O2 for exertion. Compliant with CPAP.   Chronic diastolic heart failure - Most recent echo NYHA class II today, euvolemic.  EF 60 to 65%, mild LVH, tricuspid regurgitation signal was inadequate for assessing PA pressure. Currently on GMDT with Jardiance, lisinopril. Sinus bradycardia at baseline, prohibiting beta blocker use.   AAA - s/p repair ~ 20 years ago. Previously on Plavix per [facility]-- pt stated she "had a clot after repair" and this is why she was on Plavix long term. Will resume Plavix at 75 mg daily after completion of her Eliquis.   Hypertension - BP today is 130/70, she is tearful and upset. Continue lisinopril 2.5 mg daily.  Could increase her dose of lisinopril if blood pressure continues to be marginally high however in light of the extreme stress she is under we will leave her current dose of 2.5.  Pulmonary embolism -currently  on Eliquis, managed by PCP.  She states she thinks she will be on it for another 3 months.  HLD -most recent LDL was elevated at 88 on 0/45/40981/31/2024, Crestor was increased to 20 mg in June 2023.  She has had recent cramping in her hands and legs and she feels this may be attributed to her Crestor dosing.  Will repeat CMET today, direct LDL.  Disposition-CMET, CBC, direct LDL, vitamin D, vitamin B12.  Return in 3 months.              Medication Adjustments/Labs and Tests Ordered: Current medicines are reviewed at length with the patient today.  Concerns regarding medicines are outlined above.  Orders Placed This Encounter  Procedures   Comp Met (CMET)   CBC   Magnesium   Vitamin D (25 hydroxy)   Direct LDL   Vitamin B12   EKG 12-Lead   No orders of the defined types were placed in this encounter.   Patient Instructions  Medication Instructions:  No changes at this time.   *If you need a refill on your cardiac medications before your next appointment, please call your pharmacy*   Lab Work: CMET, CBC, Mag, Vit D, Vit B12, Direct  LDL to be done today [facility] at [facility]. Stop at registration desk to check in.   If you have labs (blood work) drawn today and your tests are completely normal, you will receive your results only by: MyChart Message (if you have MyChart) OR A paper copy in the mail If you have any lab test that is abnormal or we need to change your treatment, we will call you to review the results.   Testing/Procedures: None   Follow-Up: At [facility], you and your health needs are our priority.  As part of our continuing mission to provide you with exceptional heart care, we have created designated Provider Care Teams.  These Care Teams include your primary Cardiologist (physician) and Advanced Practice Providers (APPs -  Physician Assistants and Nurse Practitioners) who all work together to provide you with the care you need, when you need it.  Your next appointment:   3 month(s)  Provider:   Debbe OdeaBrian Agbor-Etang, MD or Eula Listenyan Dunn, PA-C       Signed, Flossie DibbleJennifer C Amillya Chavira, NP  05/08/2022 12:43 PM    [facility]

## 2022-05-08 ENCOUNTER — Ambulatory Visit: Payer: 59 | Attending: Physician Assistant | Admitting: Cardiology

## 2022-05-08 ENCOUNTER — Other Ambulatory Visit
Admission: RE | Admit: 2022-05-08 | Discharge: 2022-05-08 | Disposition: A | Discharge status: Home / Self Care | Payer: 59 | Source: Ambulatory Visit | Attending: Cardiology | Admitting: Cardiology

## 2022-05-08 ENCOUNTER — Encounter: Payer: Self-pay | Admitting: Physician Assistant

## 2022-05-08 VITALS — BP 130/70 | HR 58 | Ht 66.0 in | Wt 192.6 lb

## 2022-05-08 DIAGNOSIS — I5032 Chronic diastolic (congestive) heart failure: Secondary | ICD-10-CM

## 2022-05-08 DIAGNOSIS — R252 Cramp and spasm: Secondary | ICD-10-CM | POA: Insufficient documentation

## 2022-05-08 DIAGNOSIS — Z9981 Dependence on supplemental oxygen: Secondary | ICD-10-CM | POA: Diagnosis not present

## 2022-05-08 DIAGNOSIS — Z7984 Long term (current) use of oral hypoglycemic drugs: Secondary | ICD-10-CM | POA: Insufficient documentation

## 2022-05-08 DIAGNOSIS — I272 Pulmonary hypertension, unspecified: Secondary | ICD-10-CM

## 2022-05-08 DIAGNOSIS — I714 Abdominal aortic aneurysm, without rupture, unspecified: Secondary | ICD-10-CM

## 2022-05-08 DIAGNOSIS — I2699 Other pulmonary embolism without acute cor pulmonale: Secondary | ICD-10-CM | POA: Diagnosis not present

## 2022-05-08 DIAGNOSIS — Z7901 Long term (current) use of anticoagulants: Secondary | ICD-10-CM | POA: Diagnosis not present

## 2022-05-08 DIAGNOSIS — E785 Hyperlipidemia, unspecified: Secondary | ICD-10-CM

## 2022-05-08 DIAGNOSIS — Z6831 Body mass index (BMI) 31.0-31.9, adult: Secondary | ICD-10-CM | POA: Insufficient documentation

## 2022-05-08 DIAGNOSIS — Z79899 Other long term (current) drug therapy: Secondary | ICD-10-CM | POA: Diagnosis not present

## 2022-05-08 DIAGNOSIS — I110 Hypertensive heart disease with heart failure: Secondary | ICD-10-CM | POA: Insufficient documentation

## 2022-05-08 DIAGNOSIS — I2510 Atherosclerotic heart disease of native coronary artery without angina pectoris: Secondary | ICD-10-CM | POA: Diagnosis not present

## 2022-05-08 DIAGNOSIS — I10 Essential (primary) hypertension: Secondary | ICD-10-CM | POA: Insufficient documentation

## 2022-05-08 DIAGNOSIS — G4733 Obstructive sleep apnea (adult) (pediatric): Secondary | ICD-10-CM | POA: Insufficient documentation

## 2022-05-08 LAB — CBC
HCT: 42.9 % (ref 36.0–46.0)
Hemoglobin: 13.8 g/dL (ref 12.0–15.0)
MCH: 27.9 pg (ref 26.0–34.0)
MCHC: 32.2 g/dL (ref 30.0–36.0)
MCV: 86.8 fL (ref 80.0–100.0)
Platelets: 172 K/uL (ref 150–400)
RBC: 4.94 MIL/uL (ref 3.87–5.11)
RDW: 14.3 % (ref 11.5–15.5)
WBC: 6.2 K/uL (ref 4.0–10.5)
nRBC: 0 % (ref 0.0–0.2)

## 2022-05-08 LAB — COMPREHENSIVE METABOLIC PANEL WITH GFR
ALT: 18 U/L (ref 0–44)
AST: 23 U/L (ref 15–41)
Albumin: 4.3 g/dL (ref 3.5–5.0)
Alkaline Phosphatase: 51 U/L (ref 38–126)
Anion gap: 8 (ref 5–15)
BUN: 25 mg/dL — ABNORMAL HIGH (ref 8–23)
CO2: 25 mmol/L (ref 22–32)
Calcium: 8.9 mg/dL (ref 8.9–10.3)
Chloride: 102 mmol/L (ref 98–111)
Creatinine, Ser: 0.89 mg/dL (ref 0.44–1.00)
GFR, Estimated: >60 mL/min
Glucose, Bld: 98 mg/dL (ref 70–99)
Potassium: 3.9 mmol/L (ref 3.5–5.1)
Sodium: 135 mmol/L (ref 135–145)
Total Bilirubin: 0.5 mg/dL (ref 0.3–1.2)
Total Protein: 7.1 g/dL (ref 6.5–8.1)

## 2022-05-08 LAB — VITAMIN B12: Vitamin B-12: 220 pg/mL (ref 180–914)

## 2022-05-08 LAB — LDL CHOLESTEROL, DIRECT: Direct LDL: 90 mg/dL (ref 0–99)

## 2022-05-08 LAB — MAGNESIUM: Magnesium: 2.2 mg/dL (ref 1.7–2.4)

## 2022-05-08 NOTE — Patient Instructions (Signed)
Medication Instructions:  No changes at this time.   *If you need a refill on your cardiac medications before your next appointment, please call your pharmacy*   Lab Work: CMET, CBC, Mag, Vit D, Vit B12, Direct LDL to be done today over at the [facility]. Stop at registration desk to check in.   If you have labs (blood work) drawn today and your tests are completely normal, you will receive your results only by: MyChart Message (if you have MyChart) OR A paper copy in the mail If you have any lab test that is abnormal or we need to change your treatment, we will call you to review the results.   Testing/Procedures: None   Follow-Up: At [facility], you and your health needs are our priority.  As part of our continuing mission to provide you with exceptional heart care, we have created designated Provider Care Teams.  These Care Teams include your primary Cardiologist (physician) and Advanced Practice Providers (APPs -  Physician Assistants and Nurse Practitioners) who all work together to provide you with the care you need, when you need it.  Your next appointment:   3 month(s)  Provider:   Debbe Odea, MD or Eula Listen, PA-C

## 2022-05-09 LAB — VITAMIN D 25 HYDROXY (VIT D DEFICIENCY, FRACTURES)

## 2022-05-12 ENCOUNTER — Other Ambulatory Visit: Payer: Self-pay

## 2022-05-12 DIAGNOSIS — E785 Hyperlipidemia, unspecified: Secondary | ICD-10-CM

## 2022-05-27 ENCOUNTER — Encounter: Payer: Self-pay | Admitting: Nurse Practitioner

## 2022-05-27 ENCOUNTER — Ambulatory Visit (INDEPENDENT_AMBULATORY_CARE_PROVIDER_SITE_OTHER): Payer: 59 | Admitting: Nurse Practitioner

## 2022-05-27 VITALS — BP 144/73 | HR 54 | Temp 98.5°F | Wt 188.0 lb

## 2022-05-27 DIAGNOSIS — I272 Pulmonary hypertension, unspecified: Secondary | ICD-10-CM

## 2022-05-27 DIAGNOSIS — I2510 Atherosclerotic heart disease of native coronary artery without angina pectoris: Secondary | ICD-10-CM | POA: Diagnosis not present

## 2022-05-27 DIAGNOSIS — I2782 Chronic pulmonary embolism: Secondary | ICD-10-CM

## 2022-05-27 DIAGNOSIS — I700 Atherosclerosis of aorta: Secondary | ICD-10-CM

## 2022-05-27 DIAGNOSIS — I10 Essential (primary) hypertension: Secondary | ICD-10-CM | POA: Diagnosis not present

## 2022-05-27 DIAGNOSIS — R7301 Impaired fasting glucose: Secondary | ICD-10-CM

## 2022-05-27 DIAGNOSIS — E782 Mixed hyperlipidemia: Secondary | ICD-10-CM

## 2022-05-27 DIAGNOSIS — I714 Abdominal aortic aneurysm, without rupture, unspecified: Secondary | ICD-10-CM

## 2022-05-27 DIAGNOSIS — I509 Heart failure, unspecified: Secondary | ICD-10-CM | POA: Diagnosis not present

## 2022-05-27 DIAGNOSIS — J449 Chronic obstructive pulmonary disease, unspecified: Secondary | ICD-10-CM

## 2022-05-27 MED ORDER — LISINOPRIL 5 MG PO TABS: ORAL_TABLET | ORAL | 1 refills | Status: DC

## 2022-05-27 MED ORDER — ALPRAZOLAM 0.5 MG PO TABS: 0.5000 mg | ORAL_TABLET | Freq: Three times a day (TID) | ORAL | 0 refills | Status: DC | PRN

## 2022-05-27 MED ORDER — BUDESONIDE-FORMOTEROL FUMARATE 160-4.5 MCG/ACT IN AERO
2.0000 | INHALATION_SPRAY | Freq: Two times a day (BID) | RESPIRATORY_TRACT | 5 refills | Status: DC
Start: 2022-05-27 — End: 2022-11-18

## 2022-05-27 MED ORDER — CYCLOBENZAPRINE HCL 5 MG PO TABS: 5.0000 mg | ORAL_TABLET | Freq: Three times a day (TID) | ORAL | 1 refills | Status: DC | PRN

## 2022-05-27 NOTE — Assessment & Plan Note (Signed)
Chronic.  Controlled.  Continue with current medication regimen of Rosuvastatin daily.  Labs ordered today.  Return to clinic in 6 months for reevaluation.  Call sooner if concerns arise.   

## 2022-05-27 NOTE — Assessment & Plan Note (Addendum)
Chronic. Cause of recent hospitalization.  Continues to follow up with Pulmonology.  On 2L baseline oxygen.  Praised for stopping.  Refilled Symbicort.

## 2022-05-27 NOTE — Progress Notes (Signed)
BP (!) 144/73   Pulse (!) 54   Temp 98.5 F (36.9 C) (Oral)   Wt 188 lb (85.3 kg)   LMP  (LMP Unknown)   SpO2 95%   BMI 30.34 kg/m    Subjective:    Patient ID: Brandi Steele, female    DOB: 08-05-1955, 67 y.o.   MRN: [id number]  HPI: Brandi Steele is a 67 y.o. female  Chief Complaint  Patient presents with   Hypertension   Hyperlipidemia   COPD   CHF Patient see's Dr. Gala Romney.  She is doing well since her recent hospitalization.    COPD Seeing Pulmonology.  COPD status: controlled Satisfied with current treatment?: yes Oxygen use: yes Dyspnea frequency: none Cough frequency:  Rescue inhaler frequency:  none since she quit vaping in August Limitation of activity: no Productive cough:  Last Spirometry:  Pneumovax:  Allergy Influenza: Not up to Date   HYPERTENSION / HYPERLIPIDEMIA Has changed her diet.  She is not taking her Plavix due to being on Eliquis for her blood clot from January.  Satisfied with current treatment? no Duration of hypertension: years BP monitoring frequency: daily BP range: 140-150/80-90 BP medication side effects: no Past BP meds: lisinopril Duration of hyperlipidemia: years Cholesterol medication side effects: no Cholesterol supplements: none Past cholesterol medications: rosuvastatin (crestor)- cardiology wants to increase the dose but she is having back cramps and doesn't want to increase it.   Medication compliance: excellent compliance Aspirin: no Recent stressors: no Recurrent headaches: no Visual changes: no Palpitations: no Dyspnea: no Chest pain: no Lower extremity edema: no Dizzy/lightheaded: no  ANXIETY Patient has anxiety and PTSD.  She feels like she is doing well.  She is having trouble sleeping.  She has been using hydroxyzine which is making her very groggy the next day.      Patient states she has been having some back pain since Friday night.  She reached for something and felt something in her back  pull.  She would like to have some flexeril on hand.    Relevant past medical, surgical, family and social history reviewed and updated as indicated. Interim medical history since our last visit reviewed. Allergies and medications reviewed and updated.  Review of Systems  Eyes:  Negative for visual disturbance.  Respiratory:  Negative for cough, chest tightness and shortness of breath.   Cardiovascular:  Negative for chest pain, palpitations and leg swelling.  Musculoskeletal:  Positive for back pain.  Neurological:  Negative for dizziness and headaches.  Psychiatric/Behavioral:  Negative for dysphoric mood and suicidal ideas. The patient is nervous/anxious.     Per HPI unless specifically indicated above     Objective:    BP (!) 144/73   Pulse (!) 54   Temp 98.5 F (36.9 C) (Oral)   Wt 188 lb (85.3 kg)   LMP  (LMP Unknown)   SpO2 95%   BMI 30.34 kg/m   Wt Readings from Last 3 Encounters:  05/27/22 188 lb (85.3 kg)  05/08/22 192 lb 9.6 oz (87.4 kg)  03/11/22 194 lb 12.8 oz (88.4 kg)    Physical Exam Vitals and nursing note reviewed.  Constitutional:      General: She is not in acute distress.    Appearance: Normal appearance. She is obese. She is not ill-appearing, toxic-appearing or diaphoretic.  HENT:     Head: Normocephalic.     Right Ear: External ear normal.     Left Ear: External ear normal.  Nose: Nose normal.     Mouth/Throat:     Mouth: Mucous membranes are moist.     Pharynx: Oropharynx is clear.  Eyes:     General:        Right eye: No discharge.        Left eye: No discharge.     Extraocular Movements: Extraocular movements intact.     Conjunctiva/sclera: Conjunctivae normal.     Pupils: Pupils are equal, round, and reactive to light.  Cardiovascular:     Rate and Rhythm: Normal rate and regular rhythm.     Heart sounds: No murmur heard. Pulmonary:     Effort: Pulmonary effort is normal. No respiratory distress.     Breath sounds: Normal breath  sounds. No wheezing or rales.  Musculoskeletal:     Cervical back: Normal range of motion and neck supple.  Skin:    General: Skin is warm and dry.     Capillary Refill: Capillary refill takes less than 2 seconds.  Neurological:     General: No focal deficit present.     Mental Status: She is alert and oriented to person, place, and time. Mental status is at baseline.  Psychiatric:        Mood and Affect: Mood normal.        Behavior: Behavior normal.        Thought Content: Thought content normal.        Judgment: Judgment normal.     Results for orders placed or performed during the hospital encounter of 05/08/22  Direct LDL  Result Value Ref Range   Direct LDL 90 0 - 99 mg/dL  Vitamin Z61  Result Value Ref Range   Vitamin B-12 220 180 - 914 pg/mL  Vitamin D (25 hydroxy)  Result Value Ref Range   Vit D, 25-Hydroxy See Scanned report in [facility] 30 - 100 ng/mL  Magnesium  Result Value Ref Range   Magnesium 2.2 1.7 - 2.4 mg/dL  CBC  Result Value Ref Range   WBC 6.2 4.0 - 10.5 K/uL   RBC 4.94 3.87 - 5.11 MIL/uL   Hemoglobin 13.8 12.0 - 15.0 g/dL   HCT 09.6 04.5 - 40.9 %   MCV 86.8 80.0 - 100.0 fL   MCH 27.9 26.0 - 34.0 pg   MCHC 32.2 30.0 - 36.0 g/dL   RDW 81.1 91.4 - 78.2 %   Platelets 172 150 - 400 K/uL   nRBC 0.0 0.0 - 0.2 %  Comp Met (CMET)  Result Value Ref Range   Sodium 135 135 - 145 mmol/L   Potassium 3.9 3.5 - 5.1 mmol/L   Chloride 102 98 - 111 mmol/L   CO2 25 22 - 32 mmol/L   Glucose, Bld 98 70 - 99 mg/dL   BUN 25 (H) 8 - 23 mg/dL   Creatinine, Ser 9.56 0.44 - 1.00 mg/dL   Calcium 8.9 8.9 - 21.3 mg/dL   Total Protein 7.1 6.5 - 8.1 g/dL   Albumin 4.3 3.5 - 5.0 g/dL   AST 23 15 - 41 U/L   ALT 18 0 - 44 U/L   Alkaline Phosphatase 51 38 - 126 U/L   Total Bilirubin 0.5 0.3 - 1.2 mg/dL   GFR, Estimated >08 >65 mL/min   Anion gap 8 5 - 15      Assessment & Plan:   Problem List Items Addressed This Visit       Cardiovascular and Mediastinum    CHF (congestive  heart failure) (HCC)    Chronic.  Following with up with Cardiology.  Continue with Lasix.  Continue to follow their recommendations.   - Reminded to call for an overnight weight gain of >2 pounds or a weekly weight gain of >5 pounds - not adding salt to food and read food labels. Reviewed the importance of keeping daily sodium intake to 2000mg  daily. - Avoid Ibuprofen products.       Relevant Medications   lisinopril (ZESTRIL) 5 MG tablet   Essential hypertension    Chronic.  Elevated at visit and at home.  Will increase Lisinopril to 5mg  daily.  Labs ordered today.  Return to clinic in 1 months for reevaluation.  Call sooner if concerns arise.        Relevant Medications   lisinopril (ZESTRIL) 5 MG tablet   Other Relevant Orders   Comp Met (CMET)   CAD (coronary artery disease)    Chronic.  Controlled.  Continue with current medication regimen of Crestor 20mg .  Does not want to increase her Rosuvastatin to 40mg .  Labs ordered today.  Return to clinic in 3 months for reevaluation.  Call sooner if concerns arise.        Relevant Medications   lisinopril (ZESTRIL) 5 MG tablet   Aortic atherosclerosis (HCC)    Chronic. Found on imaging CT 01/2017.  Recommend she continue taking statin and ASA daily for prevention.  Will restart Plavix once treatment for PE is concluded which should be be next month.  Continue complete cessation of smoking.  Follow up in 3 months.  Call sooner if concerns arise.       Relevant Medications   lisinopril (ZESTRIL) 5 MG tablet   Abdominal aortic aneurysm (AAA) without rupture (HCC)    Chronic.  Controlled.  Repaired approximately 20 years ago.  Needs to be back on Plavix once Eliquis treatment is done for PE.  Labs ordered today.  Return to clinic in 3 months for reevaluation.  Call sooner if concerns arise.        Relevant Medications   lisinopril (ZESTRIL) 5 MG tablet   Pulmonary hypertension (HCC) - Primary    Chronic. Cause of  recent hospitalization.  Continues to follow up with Pulmonology.  On 2L baseline oxygen.  Praised for stopping.  Refilled Symbicort.       Relevant Medications   lisinopril (ZESTRIL) 5 MG tablet   Pulmonary embolism (HCC)    Found in January.  Stopped Plavix at that time and started Eliquis.  Will need at least 3 months of treatment with Eliquis then will transition back to Plavix.        Relevant Medications   lisinopril (ZESTRIL) 5 MG tablet     Endocrine   IFG (impaired fasting glucose)    Labs ordered at visit today.  Will make recommendations based on lab results.        Relevant Orders   HgB A1c     Other   Hyperlipidemia    Chronic.  Controlled.  Continue with current medication regimen of Rosuvastatin daily.  Labs ordered today.  Return to clinic in 6 months for reevaluation.  Call sooner if concerns arise.        Relevant Medications   lisinopril (ZESTRIL) 5 MG tablet   Other Relevant Orders   Lipid Profile   Other Visit Diagnoses     Chronic obstructive pulmonary disease, unspecified COPD type (HCC)       Relevant Medications   budesonide-formoterol (  SYMBICORT) 160-4.5 MCG/ACT inhaler        Follow up plan: Return in about 1 month (around 06/26/2022) for BP Check.

## 2022-05-27 NOTE — Assessment & Plan Note (Signed)
Chronic.  Controlled.  Continue with current medication regimen of Crestor 20mg .  Does not want to increase her Rosuvastatin to 40mg .  Labs ordered today.  Return to clinic in 3 months for reevaluation.  Call sooner if concerns arise.

## 2022-05-27 NOTE — Assessment & Plan Note (Signed)
Chronic. Found on imaging CT 01/2017.  Recommend she continue taking statin and ASA daily for prevention.  Will restart Plavix once treatment for PE is concluded which should be be next month.  Continue complete cessation of smoking.  Follow up in 3 months.  Call sooner if concerns arise.

## 2022-05-27 NOTE — Assessment & Plan Note (Signed)
Chronic.  Controlled.  Repaired approximately 20 years ago.  Needs to be back on Plavix once Eliquis treatment is done for PE.  Labs ordered today.  Return to clinic in 3 months for reevaluation.  Call sooner if concerns arise.

## 2022-05-27 NOTE — Assessment & Plan Note (Signed)
Labs ordered at visit today.  Will make recommendations based on lab results.   

## 2022-05-27 NOTE — Assessment & Plan Note (Signed)
Chronic.  Elevated at visit and at home.  Will increase Lisinopril to 5mg  daily.  Labs ordered today.  Return to clinic in 1 months for reevaluation.  Call sooner if concerns arise.

## 2022-05-27 NOTE — Assessment & Plan Note (Signed)
Chronic.  Following with up with Cardiology.  Continue with Lasix.  Continue to follow their recommendations.   - Reminded to call for an overnight weight gain of >2 pounds or a weekly weight gain of >5 pounds - not adding salt to food and read food labels. Reviewed the importance of keeping daily sodium intake to <2000mg daily. - Avoid Ibuprofen products.  

## 2022-05-27 NOTE — Assessment & Plan Note (Signed)
Found in January.  Stopped Plavix at that time and started Eliquis.  Will need at least 3 months of treatment with Eliquis then will transition back to Plavix.

## 2022-05-28 LAB — COMPREHENSIVE METABOLIC PANEL
ALT: 14 IU/L (ref 0–32)
AST: 20 IU/L (ref 0–40)
Albumin/Globulin Ratio: 2.1 (ref 1.2–2.2)
Albumin: 4.7 g/dL (ref 3.9–4.9)
Alkaline Phosphatase: 57 IU/L (ref 44–121)
BUN/Creatinine Ratio: 26 (ref 12–28)
BUN: 22 mg/dL (ref 8–27)
Bilirubin Total: 0.3 mg/dL (ref 0.0–1.2)
CO2: 22 mmol/L (ref 20–29)
Calcium: 9.8 mg/dL (ref 8.7–10.3)
Chloride: 104 mmol/L (ref 96–106)
Creatinine, Ser: 0.85 mg/dL (ref 0.57–1.00)
Globulin, Total: 2.2 g/dL (ref 1.5–4.5)
Glucose: 89 mg/dL (ref 70–99)
Potassium: 4.3 mmol/L (ref 3.5–5.2)
Sodium: 142 mmol/L (ref 134–144)
Total Protein: 6.9 g/dL (ref 6.0–8.5)
eGFR: 75 mL/min/{1.73_m2} (ref >59)

## 2022-05-28 LAB — LIPID PANEL
Chol/HDL Ratio: 3.7 ratio (ref 0.0–4.4)
Cholesterol, Total: 162 mg/dL (ref 100–199)
HDL: 44 mg/dL (ref >39)
LDL Chol Calc (NIH): 94 mg/dL (ref 0–99)
Triglycerides: 138 mg/dL (ref 0–149)
VLDL Cholesterol Cal: 24 mg/dL (ref 5–40)

## 2022-05-28 LAB — HEMOGLOBIN A1C
Est. average glucose Bld gHb Est-mCnc: 120 mg/dL
Hgb A1c MFr Bld: 5.8 % — ABNORMAL HIGH (ref 4.8–5.6)

## 2022-05-28 NOTE — Progress Notes (Signed)
Hi Declynn.  Your labs look great.  Keep up the good work!  See you at our next visit.

## 2022-06-11 ENCOUNTER — Encounter: Payer: Self-pay | Admitting: Nurse Practitioner

## 2022-06-18 ENCOUNTER — Telehealth: Payer: Self-pay

## 2022-06-18 ENCOUNTER — Encounter: Payer: Self-pay | Admitting: Pharmacist Clinician (PhC)/ Clinical Pharmacy Specialist

## 2022-06-18 ENCOUNTER — Other Ambulatory Visit (HOSPITAL_COMMUNITY): Payer: Self-pay

## 2022-06-18 ENCOUNTER — Telehealth: Payer: Self-pay | Admitting: Pharmacist Clinician (PhC)/ Clinical Pharmacy Specialist

## 2022-06-18 ENCOUNTER — Ambulatory Visit: Payer: 59 | Attending: Cardiovascular Disease | Admitting: Pharmacist Clinician (PhC)/ Clinical Pharmacy Specialist

## 2022-06-18 DIAGNOSIS — E782 Mixed hyperlipidemia: Secondary | ICD-10-CM | POA: Diagnosis not present

## 2022-06-18 NOTE — Telephone Encounter (Signed)
Pharmacy Patient Advocate Encounter   Received notification from [facility] that prior authorization for REPATHA 140MG /ML is required/requested.   PA submitted on 5.22.24 to (ins) [employer] via CoverMyMeds [employer] or [facility]) confirmation # [id number]   Status is pending

## 2022-06-18 NOTE — Assessment & Plan Note (Signed)
Assessment: Patient with ASCVD not at LDL goal of < 70 Most recent LDL 94 on 05/27/22 Has been compliant with high intensity statin : rosuvastatin 20 mg - currently taking, but having ongoing myalgias and would like to decrease dose Not able to tolerate higher doses of statins secondary to myalgias Reviewed options for lowering LDL cholesterol, including ezetimibe, PCSK-9 inhibitors, bempedoic acid and inclisiran.  Discussed mechanisms of action, dosing, side effects, potential decreases in LDL cholesterol and costs.  Also reviewed potential options for patient assistance.  Plan: Patient agreeable to starting PCSK9 inhibitor Repeat labs after:  3 months Lipid Liver function Once approved, will sign patient up for [employer] grant (assuming she loses her dual coverage)

## 2022-06-18 NOTE — Telephone Encounter (Signed)
Please start PA for Repatha.   

## 2022-06-18 NOTE — Patient Instructions (Signed)
Your Results:             Your most recent labs Goal  Total Cholesterol 162 < 200  Triglycerides 138 < 150  HDL (happy/good cholesterol) 44 > 40  LDL (lousy/bad cholesterol 90 < 70   Medication changes:  We will start the process to get Repatha covered by your insurance.  Once the prior authorization is complete, I will send a MyChart message to let you know and confirm pharmacy information.   You will take one injection every 14 days.   Lab orders:  We want to repeat labs after 2-3 months.  We will send you a lab order to remind you once we get closer to that time.    Patient Assistance:  The [employer] Well foundation offers assistance to help pay for medication copays.  They will cover copays for all cholesterol lowering meds, including statins, fibrates, omega-3 oils, ezetimibe, Repatha, Praluent, Nexletol, Nexlizet.  The cards are usually good for $2,500 or 12 months, whichever comes first. Go to healthwellfoundation.org Click on "Apply Now" Answer questions as to whom is applying (patient or representative) Your disease fund will be "hypercholesterolemia - Medicare access" Select the cholesterol medication you need assistance with (Repatha, Praluent, Nexlizet...) They will ask question about qualifying diagnosis - you can mark "yes"; and do you have insurance coverage.   When they ask what type of assistance you are interested in - "copay assistance" When you submit, the approval is usually within minutes.  You will need to print the card information from the site You will need to show this information to your pharmacy, they will bill your Medicare Part D plan first -then bill Health Well --for the copay.   You can also call them at [phone], although the hold times can be quite long.   Thank you for choosing [facility]

## 2022-06-18 NOTE — Progress Notes (Signed)
Office Visit    Patient Name: Brandi Steele Date of Encounter: 06/18/2022  Primary Care Provider:  Larae Grooms, NP Primary Cardiologist:  Debbe Odea, MD  Chief Complaint    Hyperlipidemia   Significant Past Medical History   CAD Noted on CT imaging  PAD Aortic atherosclerosis, aneurysm repair  HTN Controlled on lisinopril 5  CHF EF 60-65%, grade 2 DD, elevated PASP (admitted 1/24 acute on chronic diastolic HF)  OSA On CPAP     Allergies  Allergen Reactions   Morphine And Codeine Nausea And Vomiting   Celexa [Citalopram Hydrobromide] Other (See Comments)    Pruritis    Effexor [Venlafaxine] Other (See Comments)    Panic attack   Lipitor [Atorvastatin] Other (See Comments)    Myalgias   Shellfish Allergy Hives and Swelling   Wellbutrin [Bupropion] Anxiety    History of Present Illness    Brandi Steele is a 66 y.o. female patient of Dr Azucena Cecil, in the office today to discuss options for cholesterol management.   She has done well with lower dose statins, but develops myalgias as the doses increase.  Currently she is on rosuvastatin 20 mg, but notes that she is having a significant amount of muscle pain.  Because of her history of PAD as well as notations of coronary plaques seen on recent CT, her LDL goal is < 70.  She has been unable to achieve this goal, as she can only tolerate lower statin doses.    When she was seen last month by Elliot Cousin, it was recommended to increase rosuvastatin to 40 mg daily.  However because of her myalgias, this did not happen and she is here to discuss other options.  Currently she has some stress at home, as her husband is in hospice care (breast cancer) and she will most likely lose her dual coverage and go strictly to Medicare in the next month or two.    Insurance Carrier:  [employer] Dual Complete - will lose Medicaid in the next few weeks - recently married   LDL Cholesterol goal:  LDL < 70  Current Medications:  rosuvastatin 20 mg qd  Previously tried: atorvastatin - myalgias  Family Hx: father died at 24 after suspected MI; mother had dementia, tia's, died in 65's; half brother (dad) CABG x 3 Three daughters (1 deceased drug issues), other two healthy   Social Hx: Tobacco: no Alcohol: no     Diet:   since her last visit has switched to heart healthy diet - no more processed foods, no red meat - notes she has lost 11 pounds in the last 6 weeks; protein is mostly chicken, some [place]; vegetables are mostly fresh, some frozen; some beans   Exercise: trying to increase walking  Adherence Assessment  Do you ever forget to take your medication? [] Yes [x] No  Do you ever skip doses due to side effects? [] Yes [x] No  Do you have trouble affording your medicines? [] Yes [x] No  Are you ever unable to pick up your medication due to transportation difficulties? [] Yes [x] No  Do you ever stop taking your medications because you don't believe they are helping? [] Yes [x] No   Adherence strategy: 7 day pill minders (AM and PM)  Accessory Clinical Findings   Lab Results  Component Value Date   CHOL 162 05/27/2022   HDL 44 05/27/2022   LDLCALC 94 05/27/2022   LDLDIRECT 90 05/08/2022   TRIG 138 05/27/2022   CHOLHDL 3.7 05/27/2022    Lab  Results  Component Value Date   ALT 14 05/27/2022   AST 20 05/27/2022   ALKPHOS 57 05/27/2022   BILITOT 0.3 05/27/2022   Lab Results  Component Value Date   CREATININE 0.85 05/27/2022   BUN 22 05/27/2022   NA 142 05/27/2022   K 4.3 05/27/2022   CL 104 05/27/2022   CO2 22 05/27/2022   Lab Results  Component Value Date   HGBA1C 5.8 (H) 05/27/2022    Home Medications    Current Outpatient Medications  Medication Sig Dispense Refill   albuterol (VENTOLIN HFA) 108 (90 Base) MCG/ACT inhaler INHALE 2 PUFFS INTO THE LUNGS EVERY 6 HOURS AS NEEDED FOR WHEEZING OR SHORTNESS OF BREATH 18 g 1   ALPRAZolam (XANAX) 0.5 MG tablet Take 1 tablet (0.5 mg total) by  mouth 3 (three) times daily as needed for anxiety. 30 tablet 0   budesonide-formoterol (SYMBICORT) 160-4.5 MCG/ACT inhaler Inhale 2 puffs into the lungs 2 (two) times daily. 1 each 5   Calcium Carbonate-Vit D-Min (CALCIUM 1200 PO) Take by mouth daily.     cetirizine (ZYRTEC) 10 MG tablet Take 1 tablet (10 mg total) by mouth daily. (Patient taking differently: Take 10 mg by mouth as needed.) 30 tablet 11   clopidogrel (PLAVIX) 75 MG tablet Take 75 mg by mouth daily.     Coenzyme Q10 (CO Q 10 PO) Take 2 capsules by mouth every morning.     empagliflozin (JARDIANCE) 10 MG TABS tablet Take 1 tablet (10 mg total) by mouth daily before breakfast. 30 tablet 6   esomeprazole (NEXIUM) 40 MG capsule TAKE ONE CAPSULE BY MOUTH TWICE DAILY (Patient taking differently: 40 mg daily. TAKE ONE CAPSULE BY MOUTH TWICE DAILY) 180 capsule 2   furosemide (LASIX) 40 MG tablet TAKE 1 TABLET(40 MG) BY MOUTH DAILY AS NEEDED 30 tablet 2   hydrOXYzine (ATARAX) 25 MG tablet TAKE 1 TABLET(25 MG) BY MOUTH THREE TIMES DAILY AS NEEDED (Patient taking differently: 1 tablet at night when can't sleep) 90 tablet 2   lisinopril (ZESTRIL) 5 MG tablet Take 1 tab daily 90 tablet 1   Magnesium 400 MG CAPS Take 550 mg by mouth. 2 in the AM and 2 in the PM     meloxicam (MOBIC) 15 MG tablet TAKE 1 TABLET(15 MG) BY MOUTH DAILY 90 tablet 3   Potassium Gluconate 595 MG CAPS Take 400 mg by mouth in the morning, at noon, in the evening, and at bedtime. Take 4 capsules daily     rosuvastatin (CRESTOR) 20 MG tablet Take 1 tablet (20 mg total) by mouth daily. 90 tablet 3   triamcinolone cream (KENALOG) 0.1 % Apply 1 application. topically 2 (two) times daily. 80 g 1   No current facility-administered medications for this visit.   Facility-Administered Medications Ordered in Other Visits  Medication Dose Route Frequency Provider Last Rate Last Admin   albuterol (PROVENTIL) (2.5 MG/3ML) 0.083% nebulizer solution 2.5 mg  2.5 mg Nebulization Once  Bensimhon, Bevelyn Buckles, MD         Assessment & Plan    Hyperlipidemia Assessment: Patient with ASCVD not at LDL goal of < 70 Most recent LDL 94 on 05/27/22 Has been compliant with high intensity statin : rosuvastatin 20 mg - currently taking, but having ongoing myalgias and would like to decrease dose Not able to tolerate higher doses of statins secondary to myalgias Reviewed options for lowering LDL cholesterol, including ezetimibe, PCSK-9 inhibitors, bempedoic acid and inclisiran.  Discussed mechanisms of action,  dosing, side effects, potential decreases in LDL cholesterol and costs.  Also reviewed potential options for patient assistance.  Plan: Patient agreeable to starting PCSK9 inhibitor Repeat labs after:  3 months Lipid Liver function Once approved, will sign patient up for [employer] grant (assuming she loses her dual coverage)   Phillips Hay, PharmD CPP [facility] [address]  [place], [place] [address] [phone]  06/18/2022, 9:42 AM

## 2022-06-24 NOTE — Telephone Encounter (Signed)
  Pt is calling to follow up PA for repatha. Also, she said, after stopping taking Crestor she no longer feeling any cramps anymore.

## 2022-06-25 MED ORDER — REPATHA SURECLICK 140 MG/ML ~~LOC~~ SOAJ: 140.0000 mg | SUBCUTANEOUS | 3 refills | Status: DC

## 2022-06-25 NOTE — Addendum Note (Signed)
Addended by: Rosalee Kaufman on: 06/25/2022 12:07 PM   Modules accepted: Orders

## 2022-06-25 NOTE — Telephone Encounter (Signed)
Pharmacy Patient Advocate Encounter  Prior Authorization for REPATHA 140MG /ML has been approved by [employer] (ins).    KEY # [id number]  Effective dates: 5.22.24 through 11.22.24

## 2022-06-25 NOTE — Telephone Encounter (Signed)
Spoke with patient - still on dual medicaid/medicare for now, but that may change in the next month or two.  Will send in 3 month supply and she is aware to call us should her insurance change and she needs [employer] grant

## 2022-06-25 NOTE — Telephone Encounter (Signed)
Patient aware, rx sent

## 2022-06-27 ENCOUNTER — Ambulatory Visit (INDEPENDENT_AMBULATORY_CARE_PROVIDER_SITE_OTHER): Payer: 59 | Admitting: Nurse Practitioner

## 2022-06-27 ENCOUNTER — Encounter: Payer: Self-pay | Admitting: Nurse Practitioner

## 2022-06-27 VITALS — BP 120/85 | HR 52 | Temp 98.0°F | Wt 183.4 lb

## 2022-06-27 DIAGNOSIS — E782 Mixed hyperlipidemia: Secondary | ICD-10-CM | POA: Diagnosis not present

## 2022-06-27 DIAGNOSIS — I10 Essential (primary) hypertension: Secondary | ICD-10-CM | POA: Diagnosis not present

## 2022-06-27 MED ORDER — LISINOPRIL 10 MG PO TABS: 10.0000 mg | ORAL_TABLET | Freq: Every day | ORAL | 1 refills | Status: DC

## 2022-06-27 NOTE — Progress Notes (Signed)
BP 120/85   Pulse (!) 52   Temp 98 F (36.7 C)   Wt 183 lb 6.4 oz (83.2 kg)   LMP  (LMP Unknown)   SpO2 97%   BMI 29.60 kg/m    Subjective:    Patient ID: Brandi Steele, female    DOB: 04-06-1955, 67 y.o.   MRN: [id number]  HPI: Brandi Steele is a 67 y.o. female  Chief Complaint  Patient presents with   Hypertension   HYPERTENSION without Chronic Kidney Disease Hypertension status: uncontrolled  Satisfied with current treatment? yes Duration of hypertension: years BP monitoring frequency:  daily BP range: 130/60 BP medication side effects:  no Medication compliance: excellent compliance Previous BP meds:lisinopril Aspirin: no Recurrent headaches: no Visual changes: no Palpitations: no Dyspnea: yes Chest pain: no Lower extremity edema: no Dizzy/lightheaded: no  Relevant past medical, surgical, family and social history reviewed and updated as indicated. Interim medical history since our last visit reviewed. Allergies and medications reviewed and updated.  Review of Systems  Eyes:  Negative for visual disturbance.  Respiratory:  Positive for shortness of breath. Negative for cough and chest tightness.   Cardiovascular:  Negative for chest pain, palpitations and leg swelling.  Neurological:  Negative for dizziness and headaches.    Per HPI unless specifically indicated above     Objective:    BP 120/85   Pulse (!) 52   Temp 98 F (36.7 C)   Wt 183 lb 6.4 oz (83.2 kg)   LMP  (LMP Unknown)   SpO2 97%   BMI 29.60 kg/m   Wt Readings from Last 3 Encounters:  06/27/22 183 lb 6.4 oz (83.2 kg)  05/27/22 188 lb (85.3 kg)  05/08/22 192 lb 9.6 oz (87.4 kg)    Physical Exam Vitals and nursing note reviewed.  Constitutional:      General: She is not in acute distress.    Appearance: Normal appearance. She is normal weight. She is not ill-appearing, toxic-appearing or diaphoretic.  HENT:     Head: Normocephalic.     Right Ear: External ear normal.      Left Ear: External ear normal.     Nose: Nose normal.     Mouth/Throat:     Mouth: Mucous membranes are moist.     Pharynx: Oropharynx is clear.  Eyes:     General:        Right eye: No discharge.        Left eye: No discharge.     Extraocular Movements: Extraocular movements intact.     Conjunctiva/sclera: Conjunctivae normal.     Pupils: Pupils are equal, round, and reactive to light.  Cardiovascular:     Rate and Rhythm: Normal rate and regular rhythm.     Heart sounds: No murmur heard. Pulmonary:     Effort: Pulmonary effort is normal. No respiratory distress.     Breath sounds: Normal breath sounds. No wheezing or rales.  Musculoskeletal:     Cervical back: Normal range of motion and neck supple.  Skin:    General: Skin is warm and dry.     Capillary Refill: Capillary refill takes less than 2 seconds.  Neurological:     General: No focal deficit present.     Mental Status: She is alert and oriented to person, place, and time. Mental status is at baseline.  Psychiatric:        Mood and Affect: Mood normal.        Behavior:  Behavior normal.        Thought Content: Thought content normal.        Judgment: Judgment normal.     Results for orders placed or performed in visit on 05/27/22  Comp Met (CMET)  Result Value Ref Range   Glucose 89 70 - 99 mg/dL   BUN 22 8 - 27 mg/dL   Creatinine, Ser 1.61 0.57 - 1.00 mg/dL   eGFR 75 >09 UE/AVW/0.98   BUN/Creatinine Ratio 26 12 - 28   Sodium 142 134 - 144 mmol/L   Potassium 4.3 3.5 - 5.2 mmol/L   Chloride 104 96 - 106 mmol/L   CO2 22 20 - 29 mmol/L   Calcium 9.8 8.7 - 10.3 mg/dL   Total Protein 6.9 6.0 - 8.5 g/dL   Albumin 4.7 3.9 - 4.9 g/dL   Globulin, Total 2.2 1.5 - 4.5 g/dL   Albumin/Globulin Ratio 2.1 1.2 - 2.2   Bilirubin Total 0.3 0.0 - 1.2 mg/dL   Alkaline Phosphatase 57 44 - 121 IU/L   AST 20 0 - 40 IU/L   ALT 14 0 - 32 IU/L  Lipid Profile  Result Value Ref Range   Cholesterol, Total 162 100 - 199 mg/dL    Triglycerides 119 0 - 149 mg/dL   HDL 44 >14 mg/dL   VLDL Cholesterol Cal 24 5 - 40 mg/dL   LDL Chol Calc (NIH) 94 0 - 99 mg/dL   Chol/HDL Ratio 3.7 0.0 - 4.4 ratio  HgB A1c  Result Value Ref Range   Hgb A1c MFr Bld 5.8 (H) 4.8 - 5.6 %   Est. average glucose Bld gHb Est-mCnc 120 mg/dL      Assessment & Plan:   Problem List Items Addressed This Visit       Cardiovascular and Mediastinum   Essential hypertension - Primary    Chronic.  Controlled.  Continue with current medication regimen of Lisinopril 10mg .  Refills sent today.  Return to clinic in 1 months for reevaluation.  Call sooner if concerns arise.        Relevant Medications   lisinopril (ZESTRIL) 10 MG tablet     Other   Hyperlipidemia   Relevant Medications   lisinopril (ZESTRIL) 10 MG tablet   Other Relevant Orders   Lipid Profile     Follow up plan: Return in about 1 month (around 07/27/2022) for BP Check.

## 2022-06-27 NOTE — Assessment & Plan Note (Signed)
Chronic.  Controlled.  Continue with current medication regimen of Lisinopril 10mg.  Refills sent today.  Return to clinic in 1 months for reevaluation.  Call sooner if concerns arise.   

## 2022-06-28 LAB — LIPID PANEL
Chol/HDL Ratio: 5 ratio — ABNORMAL HIGH (ref 0.0–4.4)
Cholesterol, Total: 222 mg/dL — ABNORMAL HIGH (ref 100–199)
HDL: 44 mg/dL (ref >39)
LDL Chol Calc (NIH): 155 mg/dL — ABNORMAL HIGH (ref 0–99)
Triglycerides: 128 mg/dL (ref 0–149)
VLDL Cholesterol Cal: 23 mg/dL (ref 5–40)

## 2022-06-30 ENCOUNTER — Encounter: Payer: Self-pay | Admitting: Nurse Practitioner

## 2022-06-30 NOTE — Progress Notes (Signed)
Please let patient know that her cholesterol is elevated from prior.  Like I explained in the visit it is too early to tell if her diet changes are making a difference.  Don't get frustrated and continue her hard work.

## 2022-07-09 ENCOUNTER — Other Ambulatory Visit: Payer: Self-pay | Admitting: Internal Medicine

## 2022-07-28 ENCOUNTER — Ambulatory Visit (INDEPENDENT_AMBULATORY_CARE_PROVIDER_SITE_OTHER): Payer: 59 | Admitting: Nurse Practitioner

## 2022-07-28 ENCOUNTER — Encounter: Payer: Self-pay | Admitting: Nurse Practitioner

## 2022-07-28 VITALS — BP 116/69 | HR 59 | Temp 98.5°F | Wt 179.0 lb

## 2022-07-28 DIAGNOSIS — I10 Essential (primary) hypertension: Secondary | ICD-10-CM

## 2022-07-28 NOTE — Progress Notes (Signed)
BP 116/69   Pulse (!) 59   Temp 98.5 F (36.9 C) (Oral)   Wt 179 lb (81.2 kg)   LMP  (LMP Unknown)   SpO2 91%   BMI 28.89 kg/m    Subjective:    Patient ID: Brandi Steele, female    DOB: 1955/12/09, 67 y.o.   MRN: [id number]  HPI: Brandi Steele is a 67 y.o. female  Chief Complaint  Patient presents with   Hypertension   HYPERTENSION without Chronic Kidney Disease Hypertension status: controlled  Satisfied with current treatment? no Duration of hypertension: years BP monitoring frequency:  not checking BP range:  BP medication side effects:  no Medication compliance: excellent compliance Previous BP meds:lisinopril Aspirin: no Recurrent headaches: no Visual changes: no Palpitations: no Dyspnea: yes Chest pain: no Lower extremity edema: no Dizzy/lightheaded: no  Relevant past medical, surgical, family and social history reviewed and updated as indicated. Interim medical history since our last visit reviewed. Allergies and medications reviewed and updated.  Review of Systems  Eyes:  Negative for visual disturbance.  Respiratory:  Positive for shortness of breath. Negative for cough and chest tightness.   Cardiovascular:  Negative for chest pain, palpitations and leg swelling.  Neurological:  Negative for dizziness and headaches.    Per HPI unless specifically indicated above     Objective:    BP 116/69   Pulse (!) 59   Temp 98.5 F (36.9 C) (Oral)   Wt 179 lb (81.2 kg)   LMP  (LMP Unknown)   SpO2 91%   BMI 28.89 kg/m   Wt Readings from Last 3 Encounters:  07/28/22 179 lb (81.2 kg)  06/27/22 183 lb 6.4 oz (83.2 kg)  05/27/22 188 lb (85.3 kg)    Physical Exam Vitals and nursing note reviewed.  Constitutional:      General: She is not in acute distress.    Appearance: Normal appearance. She is normal weight. She is not ill-appearing, toxic-appearing or diaphoretic.  HENT:     Head: Normocephalic.     Right Ear: External ear normal.     Left  Ear: External ear normal.     Nose: Nose normal.     Mouth/Throat:     Mouth: Mucous membranes are moist.     Pharynx: Oropharynx is clear.  Eyes:     General:        Right eye: No discharge.        Left eye: No discharge.     Extraocular Movements: Extraocular movements intact.     Conjunctiva/sclera: Conjunctivae normal.     Pupils: Pupils are equal, round, and reactive to light.  Cardiovascular:     Rate and Rhythm: Normal rate and regular rhythm.     Heart sounds: No murmur heard. Pulmonary:     Effort: Pulmonary effort is normal. No respiratory distress.     Breath sounds: Normal breath sounds. No wheezing or rales.  Musculoskeletal:     Cervical back: Normal range of motion and neck supple.  Skin:    General: Skin is warm and dry.     Capillary Refill: Capillary refill takes less than 2 seconds.  Neurological:     General: No focal deficit present.     Mental Status: She is alert and oriented to person, place, and time. Mental status is at baseline.  Psychiatric:        Mood and Affect: Mood normal.        Behavior: Behavior normal.  Thought Content: Thought content normal.        Judgment: Judgment normal.     Results for orders placed or performed in visit on 06/27/22  Lipid Profile  Result Value Ref Range   Cholesterol, Total 222 (H) 100 - 199 mg/dL   Triglycerides 161 0 - 149 mg/dL   HDL 44 >09 mg/dL   VLDL Cholesterol Cal 23 5 - 40 mg/dL   LDL Chol Calc (NIH) 604 (H) 0 - 99 mg/dL   Chol/HDL Ratio 5.0 (H) 0.0 - 4.4 ratio      Assessment & Plan:   Problem List Items Addressed This Visit       Cardiovascular and Mediastinum   Essential hypertension - Primary    Chronic.  Controlled.  Continue with current medication regimen of lisinopril 10mg .  Return to clinic in 6 months for reevaluation.  Call sooner if concerns arise.           Follow up plan: Return if symptoms worsen or fail to improve.

## 2022-07-28 NOTE — Assessment & Plan Note (Addendum)
Chronic.  Controlled.  Continue with current medication regimen of lisinopril 10mg .  Return to clinic in 6 months for reevaluation.  Call sooner if concerns arise.

## 2022-08-04 ENCOUNTER — Other Ambulatory Visit: Payer: Self-pay | Admitting: Nurse Practitioner

## 2022-08-04 NOTE — Telephone Encounter (Signed)
Requested Prescriptions  Pending Prescriptions Disp Refills   clopidogrel (PLAVIX) 75 MG tablet [Pharmacy Med Name: CLOPIDOGREL 75MG  TABLETS] 90 tablet     Sig: TAKE 1 TABLET(75 MG) BY MOUTH DAILY     Hematology: Antiplatelets - clopidogrel Passed - 08/04/2022  7:19 AM      Passed - HCT in normal range and within 180 days    HCT  Date Value Ref Range Status  05/08/2022 42.9 36.0 - 46.0 % Final   Hematocrit  Date Value Ref Range Status  02/26/2022 39.2 34.0 - 46.6 % Final         Passed - HGB in normal range and within 180 days    Hemoglobin  Date Value Ref Range Status  05/08/2022 13.8 12.0 - 15.0 g/dL Final  16/10/9602 54.0 11.1 - 15.9 g/dL Final         Passed - PLT in normal range and within 180 days    Platelets  Date Value Ref Range Status  05/08/2022 172 150 - 400 K/uL Final  02/26/2022 155 150 - 450 x10E3/uL Final         Passed - Cr in normal range and within 360 days    Creatinine  Date Value Ref Range Status  06/16/2013 0.63 0.60 - 1.30 mg/dL Final   Creatinine, Ser  Date Value Ref Range Status  05/27/2022 0.85 0.57 - 1.00 mg/dL Final         Passed - Valid encounter within last 6 months    Recent Outpatient Visits           1 week ago Essential hypertension   [place] [facility] Larae Grooms, NP   1 month ago Essential hypertension   [place] [facility] Larae Grooms, NP   2 months ago Pulmonary hypertension (HCC)   [place] [facility] Larae Grooms, NP   5 months ago Annual physical exam   [place] [facility] Larae Grooms, NP   8 months ago Chronic congestive heart failure, unspecified heart failure type [facility])   [place] [facility] Larae Grooms, NP       Future Appointments             In 3 days Agbor-Etang, Arlys John, MD [facility] [place]   In 3 weeks Mecum, Oswaldo Conroy, PA-C [facility]              lisinopril (ZESTRIL) 2.5 MG tablet [Pharmacy Med Name: LISINOPRIL 2.5MG  TABLETS] 90 tablet 0    Sig: TAKE 1 TABLET(2.5 MG) BY MOUTH DAILY     Cardiovascular:  ACE Inhibitors Passed - 08/04/2022  7:19 AM      Passed - Cr in normal range and within 180 days    Creatinine  Date Value Ref Range Status  06/16/2013 0.63 0.60 - 1.30 mg/dL Final   Creatinine, Ser  Date Value Ref Range Status  05/27/2022 0.85 0.57 - 1.00 mg/dL Final         Passed - K in normal range and within 180 days    Potassium  Date Value Ref Range Status  05/27/2022 4.3 3.5 - 5.2 mmol/L Final  06/16/2013 4.0 3.5 - 5.1 mmol/L Final         Passed - Patient is not pregnant      Passed - Last BP in normal range    BP Readings from Last 1 Encounters:  07/28/22 116/69  Passed - Valid encounter within last 6 months    Recent Outpatient Visits           1 week ago Essential hypertension   [place] [facility] Larae Grooms, NP   1 month ago Essential hypertension   [place] [facility] Larae Grooms, NP   2 months ago Pulmonary hypertension [facility])   [place] [facility] Larae Grooms, NP   5 months ago Annual physical exam   [place] [facility] Larae Grooms, NP   8 months ago Chronic congestive heart failure, unspecified heart failure type [facility])   [place] [facility] Larae Grooms, NP       Future Appointments             In 3 days Agbor-Etang, Arlys John, MD [facility] [place]   In 3 weeks Mecum, Oswaldo Conroy, PA-C [place] [facility], [facility]             furosemide (LASIX) 40 MG tablet [Pharmacy Med Name: FUROSEMIDE 40MG  TABLETS] 30 tablet 2    Sig: TAKE 1 TABLET(40 MG) BY MOUTH DAILY AS NEEDED     Cardiovascular:  Diuretics - Loop Passed - 08/04/2022  7:19 AM      Passed - K in normal range and within 180 days    Potassium  Date Value Ref Range Status  05/27/2022  4.3 3.5 - 5.2 mmol/L Final  06/16/2013 4.0 3.5 - 5.1 mmol/L Final         Passed - Ca in normal range and within 180 days    Calcium  Date Value Ref Range Status  05/27/2022 9.8 8.7 - 10.3 mg/dL Final   Calcium, Total  Date Value Ref Range Status  06/16/2013 9.0 8.5 - 10.1 mg/dL Final         Passed - Na in normal range and within 180 days    Sodium  Date Value Ref Range Status  05/27/2022 142 134 - 144 mmol/L Final  06/16/2013 143 136 - 145 mmol/L Final         Passed - Cr in normal range and within 180 days    Creatinine  Date Value Ref Range Status  06/16/2013 0.63 0.60 - 1.30 mg/dL Final   Creatinine, Ser  Date Value Ref Range Status  05/27/2022 0.85 0.57 - 1.00 mg/dL Final         Passed - Cl in normal range and within 180 days    Chloride  Date Value Ref Range Status  05/27/2022 104 96 - 106 mmol/L Final  06/16/2013 103 98 - 107 mmol/L Final         Passed - Mg Level in normal range and within 180 days    Magnesium  Date Value Ref Range Status  05/08/2022 2.2 1.7 - 2.4 mg/dL Final    Comment:    Performed at [facility], [address] [place], [place] [address]  06/16/2013 1.4 (L) mg/dL Final    Comment:    6.0-4.5 THERAPEUTIC RANGE: 4-7 mg/dL TOXIC: > 10 mg/dL  -----------------------          Passed - Last BP in normal range    BP Readings from Last 1 Encounters:  07/28/22 116/69         Passed - Valid encounter within last 6 months    Recent Outpatient Visits           1 week ago Essential hypertension  [place] [facility] Larae Grooms, NP   1 month ago Essential hypertension   [place] [facility] Larae Grooms, NP   2 months ago Pulmonary hypertension [facility])   [place] [facility] Larae Grooms, NP   5 months ago Annual physical exam   [place] [facility] Larae Grooms, NP   8 months ago Chronic congestive heart failure, unspecified heart  failure type [facility])   [place] [facility] Larae Grooms, NP       Future Appointments             In 3 days Agbor-Etang, Arlys John, MD [facility] [place]   In 3 weeks Mecum, Oswaldo Conroy, PA-C  [facility], [facility]

## 2022-08-04 NOTE — Telephone Encounter (Signed)
Requested medication (s) are due for refill today - yes  Requested medication (s) are on the active medication list -yes  Future visit scheduled -yes  Last refill: 05/06/22  Notes to clinic: listed as historical medication  Requested Prescriptions  Pending Prescriptions Disp Refills   clopidogrel (PLAVIX) 75 MG tablet [Pharmacy Med Name: CLOPIDOGREL 75MG  TABLETS] 90 tablet     Sig: TAKE 1 TABLET(75 MG) BY MOUTH DAILY     Hematology: Antiplatelets - clopidogrel Passed - 08/04/2022  7:19 AM      Passed - HCT in normal range and within 180 days    HCT  Date Value Ref Range Status  05/08/2022 42.9 36.0 - 46.0 % Final   Hematocrit  Date Value Ref Range Status  02/26/2022 39.2 34.0 - 46.6 % Final         Passed - HGB in normal range and within 180 days    Hemoglobin  Date Value Ref Range Status  05/08/2022 13.8 12.0 - 15.0 g/dL Final  16/10/9602 54.0 11.1 - 15.9 g/dL Final         Passed - PLT in normal range and within 180 days    Platelets  Date Value Ref Range Status  05/08/2022 172 150 - 400 K/uL Final  02/26/2022 155 150 - 450 x10E3/uL Final         Passed - Cr in normal range and within 360 days    Creatinine  Date Value Ref Range Status  06/16/2013 0.63 0.60 - 1.30 mg/dL Final   Creatinine, Ser  Date Value Ref Range Status  05/27/2022 0.85 0.57 - 1.00 mg/dL Final         Passed - Valid encounter within last 6 months    Recent Outpatient Visits           1 week ago Essential hypertension   [place] [facility] Larae Grooms, NP   1 month ago Essential hypertension   [place] [facility] Larae [facility], NP   2 months ago Pulmonary hypertension (HCC)   [place] [facility] Larae Grooms, NP   5 months ago Annual physical exam   [place] [facility] Larae Grooms, NP   8 months ago Chronic congestive heart failure, unspecified heart failure type [facility])   [place] [facility] Larae Grooms, NP       Future Appointments             In 3 days Agbor-Etang, Arlys John, MD [facility] [place]   In 3 weeks Mecum, Oswaldo Conroy, PA-C [place] [facility], [facility]            Signed Prescriptions Disp Refills   furosemide (LASIX) 40 MG tablet 30 tablet 2    Sig: TAKE 1 TABLET(40 MG) BY MOUTH DAILY AS NEEDED     Cardiovascular:  Diuretics - Loop Passed - 08/04/2022  7:19 AM      Passed - K in normal range and within 180 days    Potassium  Date Value Ref Range Status  05/27/2022 4.3 3.5 - 5.2 mmol/L Final  06/16/2013 4.0 3.5 - 5.1 mmol/L Final         Passed - Ca in normal range and within 180 days    Calcium  Date Value Ref Range Status  05/27/2022 9.8 8.7 - 10.3 mg/dL Final   Calcium, Total  Date Value Ref Range Status  06/16/2013 9.0 8.5 - 10.1 mg/dL Final  Passed - Na in normal range and within 180 days    Sodium  Date Value Ref Range Status  05/27/2022 142 134 - 144 mmol/L Final  06/16/2013 143 136 - 145 mmol/L Final         Passed - Cr in normal range and within 180 days    Creatinine  Date Value Ref Range Status  06/16/2013 0.63 0.60 - 1.30 mg/dL Final   Creatinine, Ser  Date Value Ref Range Status  05/27/2022 0.85 0.57 - 1.00 mg/dL Final         Passed - Cl in normal range and within 180 days    Chloride  Date Value Ref Range Status  05/27/2022 104 96 - 106 mmol/L Final  06/16/2013 103 98 - 107 mmol/L Final         Passed - Mg Level in normal range and within 180 days    Magnesium  Date Value Ref Range Status  05/08/2022 2.2 1.7 - 2.4 mg/dL Final    Comment:    Performed at [facility], [address] [place], [place] [address]  06/16/2013 1.4 (L) mg/dL Final    Comment:    6.0-4.5 THERAPEUTIC RANGE: 4-7 mg/dL TOXIC: > 10 mg/dL  -----------------------          Passed - Last BP in normal range    BP Readings from Last 1 Encounters:  07/28/22 116/69         Passed -  Valid encounter within last 6 months    Recent Outpatient Visits           1 week ago Essential hypertension   [place] [facility] Larae Grooms, NP   1 month ago Essential hypertension   [place] [facility] Lar[facility] G[facility], NP   2 months ago Pulmonary hypertension [facility])   [place] [facility] Larae Grooms, NP   5 months ago Annual physical exam   [place] [facility] Larae Grooms, NP   8 months ago Chronic congestive heart failure, unspecified heart failure type [facility])   [place] [facility] Larae Grooms, NP       Future Appointments             In 3 days Agbor-Etang, Arlys John, MD [facility] [place]   In 3 weeks Mecum, Oswaldo Conroy, PA-C [place] [facility], [facility]            Refused Prescriptions Disp Refills   lisinopril (ZESTRIL) 2.5 MG tablet [Pharmacy Med Name: LISINOPRIL 2.5MG  TABLETS] 90 tablet 0    Sig: TAKE 1 TABLET(2.5 MG) BY MOUTH DAILY     Cardiovascular:  ACE Inhibitors Passed - 08/04/2022  7:19 AM      Passed - Cr in normal range and within 180 days    Creatinine  Date Value Ref Range Status  06/16/2013 0.63 0.60 - 1.30 mg/dL Final   Creatinine, Ser  Date Value Ref Range Status  05/27/2022 0.85 0.57 - 1.00 mg/dL Final         Passed - K in normal range and within 180 days    Potassium  Date Value Ref Range Status  05/27/2022 4.3 3.5 - 5.2 mmol/L Final  06/16/2013 4.0 3.5 - 5.1 mmol/L Final         Passed - Patient is not pregnant      Passed - Last BP in normal range    BP Readings from Last 1 Encounters:  07/28/22 116/69         Passed - Valid encounter within last 6 months    Recent Outpatient Visits           1 week ago Essential hypertension   [place] [facility] Larae [facility], NP   1 month ago Essential hypertension   [place] [facility] Lar[facility], NP   2 months ago  Pulmonary hypertension (HCC)   [place] [facility], NP   5 months ago Annual physical exam   [place] [facility] Larae Grooms, NP   8 months ago Chronic congestive heart failure, unspecified heart failure type [facility])   [place] [facility] Larae Grooms, NP       Future Appointments             In 3 days Agbor-Etang, Arlys John, MD [place] [facility] [place]   In 3 weeks Mecum, Oswaldo Conroy, PA-C [facility], [facility]               Requested Prescriptions  Pending Prescriptions Disp Refills   clopidogrel (PLAVIX) 75 MG tablet [Pharmacy Med Name: CLOPIDOGREL 75MG  TABLETS] 90 tablet     Sig: TAKE 1 TABLET(75 MG) BY MOUTH DAILY     Hematology: Antiplatelets - clopidogrel Passed - 08/04/2022  7:19 AM      Passed - HCT in normal range and within 180 days    HCT  Date Value Ref Range Status  05/08/2022 42.9 36.0 - 46.0 % Final   Hematocrit  Date Value Ref Range Status  02/26/2022 39.2 34.0 - 46.6 % Final         Passed - HGB in normal range and within 180 days    Hemoglobin  Date Value Ref Range Status  05/08/2022 13.8 12.0 - 15.0 g/dL Final  16/10/9602 54.0 11.1 - 15.9 g/dL Final         Passed - PLT in normal range and within 180 days    Platelets  Date Value Ref Range Status  05/08/2022 172 150 - 400 K/uL Final  02/26/2022 155 150 - 450 x10E3/uL Final         Passed - Cr in normal range and within 360 days    Creatinine  Date Value Ref Range Status  06/16/2013 0.63 0.60 - 1.30 mg/dL Final   Creatinine, Ser  Date Value Ref Range Status  05/27/2022 0.85 0.57 - 1.00 mg/dL Final         Passed - Valid encounter within last 6 months    Recent Outpatient Visits           1 week ago Essential hypertension   [place] [facility] Larae G[facility], NP   1 month ago Essential hypertension   [place] [facility] Lar[facility] G[facility], NP   2  months ago Pulmonary hypertension [facility])   [place] [facility] Lar[facility], NP   5 months ago Annual physical exam   [place] [facility] Larae Grooms, NP   8 months ago Chronic congestive heart failure, unspecified heart failure type [facility])   [place] [facility] Larae Grooms, NP       Future Appointments             In 3 days Agbor-Etang, Arlys John, MD [facility] at [place]   In 3 weeks Mecum, Oswaldo Conroy, PA-C  [facility], [facility]  Signed Prescriptions Disp Refills   furosemide (LASIX) 40 MG tablet 30 tablet 2    Sig: TAKE 1 TABLET(40 MG) BY MOUTH DAILY AS NEEDED     Cardiovascular:  Diuretics - Loop Passed - 08/04/2022  7:19 AM      Passed - K in normal range and within 180 days    Potassium  Date Value Ref Range Status  05/27/2022 4.3 3.5 - 5.2 mmol/L Final  06/16/2013 4.0 3.5 - 5.1 mmol/L Final         Passed - Ca in normal range and within 180 days    Calcium  Date Value Ref Range Status  05/27/2022 9.8 8.7 - 10.3 mg/dL Final   Calcium, Total  Date Value Ref Range Status  06/16/2013 9.0 8.5 - 10.1 mg/dL Final         Passed - Na in normal range and within 180 days    Sodium  Date Value Ref Range Status  05/27/2022 142 134 - 144 mmol/L Final  06/16/2013 143 136 - 145 mmol/L Final         Passed - Cr in normal range and within 180 days    Creatinine  Date Value Ref Range Status  06/16/2013 0.63 0.60 - 1.30 mg/dL Final   Creatinine, Ser  Date Value Ref Range Status  05/27/2022 0.85 0.57 - 1.00 mg/dL Final         Passed - Cl in normal range and within 180 days    Chloride  Date Value Ref Range Status  05/27/2022 104 96 - 106 mmol/L Final  06/16/2013 103 98 - 107 mmol/L Final         Passed - Mg Level in normal range and within 180 days    Magnesium  Date Value Ref Range Status  05/08/2022 2.2 1.7 - 2.4 mg/dL Final    Comment:    Performed at  [facility], [address] [place], [place] [address]  06/16/2013 1.4 (L) mg/dL Final    Comment:    6.0-4.5 THERAPEUTIC RANGE: 4-7 mg/dL TOXIC: > 10 mg/dL  -----------------------          Passed - Last BP in normal range    BP Readings from Last 1 Encounters:  07/28/22 116/69         Passed - Valid encounter within last 6 months    Recent Outpatient Visits           1 week ago Essential hypertension   [place] [facility] Larae Grooms, NP   1 month ago Essential hypertension   [place] [facility] Lar[facility] G[facility], NP   2 months ago Pulmonary hypertension [facility])   [place] [facility] Lar[facility] G[facility], NP   5 months ago Annual physical exam   [place] [facility] Larae Grooms, NP   8 months ago Chronic congestive heart failure, unspecified heart failure type [facility])   [place] [facility] Larae Grooms, NP       Future Appointments             In 3 days Debbe Odea, [facility] [place]   In 3 weeks Mecum, Oswaldo Conroy, PA-C [facility], [facility]            Refused Prescriptions Disp Refills   lisinopril (ZESTRIL) 2.5 MG tablet [Pharmacy Med Name: LISINOPRIL 2.5MG  TABLETS] 90 tablet 0    Sig: TAKE 1 TABLET(2.5 MG) BY MOUTH DAILY  Cardiovascular:  ACE Inhibitors Passed - 08/04/2022  7:19 AM      Passed - Cr in normal range and within 180 days    Creatinine  Date Value Ref Range Status  06/16/2013 0.63 0.60 - 1.30 mg/dL Final   Creatinine, Ser  Date Value Ref Range Status  05/27/2022 0.85 0.57 - 1.00 mg/dL Final         Passed - K in normal range and within 180 days    Potassium  Date Value Ref Range Status  05/27/2022 4.3 3.5 - 5.2 mmol/L Final  06/16/2013 4.0 3.5 - 5.1 mmol/L Final         Passed - Patient is not pregnant      Passed - Last BP in normal range    BP Readings from Last 1 Encounters:   07/28/22 116/69         Passed - Valid encounter within last 6 months    Recent Outpatient Visits           1 week ago Essential hypertension   [place] [facility] Larae Grooms, NP   1 month ago Essential hypertension   [place] [facility] Lar[facility] G[facility], NP   2 months ago Pulmonary hypertension [facility])   [place] [facility] Larae Grooms, NP   5 months ago Annual physical exam   [place] [facility] Larae Grooms, NP   8 months ago Chronic congestive heart failure, unspecified heart failure type [facility])   [place] [facility] Larae Grooms, NP       Future Appointments             In 3 days Agbor-Etang, Arlys John, MD [facility] [place]   In 3 weeks Mecum, Oswaldo Conroy, PA-C [place] [facility], [facility]

## 2022-08-05 ENCOUNTER — Encounter: Payer: Self-pay | Admitting: Physician Assistant

## 2022-08-05 ENCOUNTER — Ambulatory Visit (INDEPENDENT_AMBULATORY_CARE_PROVIDER_SITE_OTHER): Payer: 59 | Admitting: Physician Assistant

## 2022-08-05 VITALS — BP 118/75 | HR 73 | Temp 98.2°F | Ht 66.0 in | Wt 177.6 lb

## 2022-08-05 DIAGNOSIS — R058 Other specified cough: Secondary | ICD-10-CM | POA: Diagnosis not present

## 2022-08-05 DIAGNOSIS — Z638 Other specified problems related to primary support group: Secondary | ICD-10-CM | POA: Diagnosis not present

## 2022-08-05 DIAGNOSIS — J441 Chronic obstructive pulmonary disease with (acute) exacerbation: Secondary | ICD-10-CM

## 2022-08-05 NOTE — Patient Instructions (Addendum)
  Today I recommend the following to help with your concerns  Make sure you are cleaning your CPAP machine as directed. Please call your Pulmonology provider to help get supplies. If you need a new machine they would need to send in the script/order for it to make sure it is the appropriate one for you.   I would also call [employer] about supplies and CPAP maintenance  to make sure you are cleaning it correctly   You can ask about a home sleep study if this is required for you to get a new machine  I would like you to try the following for the next few days: Mucinex, Flonase and Robitussin  These should help with the mucus, nasal congestion/runny nose, and coughing respectively If you are not feeling better or are feeling worse over the next few days - please give [place] a call and let us know.     It was nice to meet you and I appreciate the opportunity to be involved in your care If you were satisfied with the care you received from me, I would greatly appreciate you saying so in the after-visit survey that is sent out following our visit.

## 2022-08-05 NOTE — Progress Notes (Unsigned)
Acute Office Visit   Patient: Brandi Steele   DOB: 04/22/1955   67 y.o. Female  MRN: [id number] Visit Date: 08/05/2022  Today's healthcare provider: Oswaldo Conroy Tausha Milhoan, PA-C  Introduced myself to the patient as a Secondary school teacher and provided education on APPs in clinical practice.    Chief Complaint  Patient presents with   Cough   Scratchy Throat   CPAP Problems    Patient says she is worried that she has a bacterial infection from her CPAP machine. Patient says she is only able to get supply every 3 to 6 months. Patient says the last time she thinks that she had it changed was November. Patient says she is under a lot of stress taking care of her brother and husband with their illness. Patient says she woke up this morning and was coughing up green phlegm.    Subjective    HPI HPI     CPAP Problems    Additional comments: Patient says she is worried that she has a bacterial infection from her CPAP machine. Patient says she is only able to get supply every 3 to 6 months. Patient says the last time she thinks that she had it changed was November. Patient says she is under a lot of stress taking care of her brother and husband with their illness. Patient says she woke up this morning and was coughing up green phlegm.       Last edited by Malen Gauze, CMA on 08/05/2022  8:52 AM.       Ian Bushman Cough  Onset: sudden  Duration: since last night  She states since about 4 am she has felt like her chest is heavy and she has been coughing  up dark green mucus She states she has a bit of a runny nose and scratchy throat She is worried that her CPAP is causing this  She states she has not been able to clean her CPAP since Nov  She states she has just changed out her mask and the tubing but is not sure if there is more to cleaning it.  She states she put a new mask and hose on last night   She states she has not started Trelegy and has been using her Symbicort She states her pharmacist  told her to stay on the Symbicort as the Trelegy would be more expensive and if she was doing fine on the Symbicort she could stay on that    She reports she has been overwhelmed with taking care of her husband and brother She became tearful when discussing this stating she has not been able to take care of her needs adequately and is afraid her CPAP has gotten dirty and has made her sick She states she has hospice support but is still struggling with taking care of her husband's needs       Medications: Outpatient Medications Prior to Visit  Medication Sig   albuterol (VENTOLIN HFA) 108 (90 Base) MCG/ACT inhaler INHALE 2 PUFFS INTO THE LUNGS EVERY 6 HOURS AS NEEDED FOR WHEEZING OR SHORTNESS OF BREATH   ALPRAZolam (XANAX) 0.5 MG tablet Take 1 tablet (0.5 mg total) by mouth 3 (three) times daily as needed for anxiety.   budesonide-formoterol (SYMBICORT) 160-4.5 MCG/ACT inhaler Inhale 2 puffs into the lungs 2 (two) times daily.   Calcium Carbonate-Vit D-Min (CALCIUM 1200 PO) Take by mouth daily.   cetirizine (ZYRTEC) 10 MG tablet Take 1 tablet (10  mg total) by mouth daily. (Patient taking differently: Take 10 mg by mouth as needed.)   clopidogrel (PLAVIX) 75 MG tablet TAKE 1 TABLET(75 MG) BY MOUTH DAILY   Coenzyme Q10 (CO Q 10 PO) Take 2 capsules by mouth every morning.   empagliflozin (JARDIANCE) 10 MG TABS tablet TAKE 1 TABLET(10 MG) BY MOUTH DAILY BEFORE BREAKFAST   esomeprazole (NEXIUM) 40 MG capsule TAKE ONE CAPSULE BY MOUTH TWICE DAILY (Patient taking differently: 40 mg daily. TAKE ONE CAPSULE BY MOUTH TWICE DAILY)   Evolocumab (REPATHA SURECLICK) 140 MG/ML SOAJ Inject 140 mg into the skin every 14 (fourteen) days.   furosemide (LASIX) 40 MG tablet TAKE 1 TABLET(40 MG) BY MOUTH DAILY AS NEEDED   hydrOXYzine (ATARAX) 25 MG tablet TAKE 1 TABLET(25 MG) BY MOUTH THREE TIMES DAILY AS NEEDED (Patient taking differently: 1 tablet at night when can't sleep)   lisinopril (ZESTRIL) 10 MG tablet  Take 1 tablet (10 mg total) by mouth daily. Take 1 tab daily   Magnesium 400 MG CAPS Take 550 mg by mouth. 2 in the AM and 2 in the PM   meloxicam (MOBIC) 15 MG tablet TAKE 1 TABLET(15 MG) BY MOUTH DAILY   Potassium Gluconate 595 MG CAPS Take 400 mg by mouth in the morning, at noon, in the evening, and at bedtime. Take 4 capsules daily   rosuvastatin (CRESTOR) 20 MG tablet Take 1 tablet (20 mg total) by mouth daily. (Patient taking differently: Take 10 mg by mouth daily.)   triamcinolone cream (KENALOG) 0.1 % Apply 1 application. topically 2 (two) times daily.   Facility-Administered Medications Prior to Visit  Medication Dose Route Frequency Provider   albuterol (PROVENTIL) (2.5 MG/3ML) 0.083% nebulizer solution 2.5 mg  2.5 mg Nebulization Once Bensimhon, Bevelyn Buckles, MD    Review of Systems  Constitutional:  Positive for fatigue. Negative for chills and fever.  HENT:  Positive for congestion, postnasal drip and rhinorrhea.   Respiratory:  Positive for cough. Negative for shortness of breath and wheezing.     {Labs  Heme  Chem  Endocrine  Serology  Results Review (optional):23779}   Objective    BP 118/75   Pulse 73   Temp 98.2 F (36.8 C) (Oral)   Ht 5\' 6"  (1.676 m)   Wt 177 lb 9.6 oz (80.6 kg)   LMP  (LMP Unknown)   SpO2 95%   BMI 28.67 kg/m  {Show previous vital signs (optional):23777}  Physical Exam Vitals reviewed.  Constitutional:      General: She is awake.     Appearance: Normal appearance. She is well-developed and well-groomed.  HENT:     Head: Normocephalic and atraumatic.     Right Ear: Hearing normal. There is impacted cerumen.     Left Ear: Hearing and tympanic membrane normal.     Mouth/Throat:     Lips: Pink.     Mouth: Mucous membranes are moist.     Pharynx: Uvula midline. No oropharyngeal exudate or posterior oropharyngeal erythema.  Eyes:     General: Lids are normal. Gaze aligned appropriately.  Cardiovascular:     Rate and Rhythm: Normal rate  and regular rhythm.     Pulses: Normal pulses.          Radial pulses are 2+ on the right side and 2+ on the left side.     Heart sounds: Normal heart sounds. No murmur heard.    No friction rub. No gallop.  Pulmonary:     Effort:  Pulmonary effort is normal.     Breath sounds: Normal breath sounds. No decreased air movement. No decreased breath sounds, wheezing, rhonchi or rales.  Musculoskeletal:     Cervical back: Normal range of motion and neck supple.     Right lower leg: No edema.     Left lower leg: No edema.  Lymphadenopathy:     Cervical:     Right cervical: No superficial or posterior cervical adenopathy.    Left cervical: No superficial or posterior cervical adenopathy.  Skin:    General: Skin is warm and dry.  Neurological:     General: No focal deficit present.     Mental Status: She is alert and oriented to person, place, and time. Mental status is at baseline.  Psychiatric:        Mood and Affect: Mood normal.        Behavior: Behavior normal. Behavior is cooperative.        Thought Content: Thought content normal.        Judgment: Judgment normal.       No results found for any visits on 08/05/22.  Assessment & Plan      No follow-ups on file.       Problem List Items Addressed This Visit       Respiratory   COPD with acute exacerbation (HCC)     Other   Caregiver role strain   Other Visit Diagnoses     Productive cough    -  Primary        No follow-ups on file.   I, Maruice Pieroni E Mikalah Skyles, PA-C, have reviewed all documentation for this visit. The documentation on 08/05/22 for the exam, diagnosis, procedures, and orders are all accurate and complete.   Jacquelin Hawking, MHS, PA-C [facility]

## 2022-08-06 NOTE — Assessment & Plan Note (Signed)
Chronic, historic condition Potential acute exacerbation given that she is experiencing newly productive cough, runny nose, congestion For now recommend treating with over-the-counter medications such as Robitussin, Mucinex, Flonase and antihistamine We discussed potential causes as her CPAP machine not being clean and also discussed her nasal cannula needing to be changed regularly to prevent further infection Recommend that she continues her current inhalers as directed If she is continuing to have symptoms even after making sure her CPAP and nasal cannula is sterilized will send in antibiotic for coverage Follow-up as needed for persistent or progressing symptoms

## 2022-08-06 NOTE — Assessment & Plan Note (Signed)
Chronic, historic condition, ongoing Patient was tearful during appointment today when discussing her health along with the strain of taking care of her husband and her brother We discussed making sure that she is taking care of her needs so that she can continue to provide care for this that rely on her Recommend follow-up as needed if symptoms become worse

## 2022-08-07 ENCOUNTER — Ambulatory Visit: Payer: 59 | Attending: Cardiology | Admitting: Cardiology

## 2022-08-07 ENCOUNTER — Encounter: Payer: Self-pay | Admitting: Cardiology

## 2022-08-07 VITALS — BP 130/78 | HR 56 | Ht 66.0 in | Wt 179.2 lb

## 2022-08-07 DIAGNOSIS — I2510 Atherosclerotic heart disease of native coronary artery without angina pectoris: Secondary | ICD-10-CM | POA: Diagnosis not present

## 2022-08-07 DIAGNOSIS — I10 Essential (primary) hypertension: Secondary | ICD-10-CM | POA: Diagnosis not present

## 2022-08-07 DIAGNOSIS — E782 Mixed hyperlipidemia: Secondary | ICD-10-CM | POA: Diagnosis not present

## 2022-08-07 DIAGNOSIS — I272 Pulmonary hypertension, unspecified: Secondary | ICD-10-CM | POA: Diagnosis not present

## 2022-08-07 NOTE — Progress Notes (Signed)
Cardiology Office Note:    Date:  08/07/2022   ID:  Brandi Steele, DOB [birth year], MRN [id number]  PCP:  Larae Grooms, NP   [facility] Providers Cardiologist:  Debbe Odea, MD     Referring MD: Larae Grooms, NP   Chief Complaint  Patient presents with   Follow-up    3 month f/u.  PCP has increased Lisinopril dosing to 10 mg every day due to increasing bp.  She believes the increased bp is due to home stressors.  Has started Repatha and decreased Rosuvastatin to 10 mg every day.      History of Present Illness:    Brandi Steele is a 67 y.o. female with a hx of CAD (RCA calcium on chest CT), hypertension, hyperlipidemia, former smoker x40+ years, pulmonary hypertension, COPD, OSA on CPAP, PAD s/p abdominal aortic surgery over 20 years ago who presents for follow-up.  Doing okay overall, states having a lot of stress due to taking care of her husband who is needs all-around care.  Brother also has brain damage, has been taking care of him since 2001.  Denies chest pain, edema, compliant with medications as prescribed.  Crestor even at reduced dose of 10 mg 2 causes hand cramping.  Started Repatha last month.   Prior notes Echo 07/2021 EF 60 to 65%, severe pulmonary hypertension, RVSP 72.4 mmHg Right heart cath 09/2021 wedge pressure 18, PA 56/24, mean 35 mmHg Outside echocardiogram 03/2021 normal EF>55% , moderate TR, severe pulmonary hypertension RVSP .  Treadmill stress test 03/2021 showed no significant ischemic EKG changes.  Past Medical History:  Diagnosis Date   Anxiety    Aortic stenosis    CHF (congestive heart failure) (HCC)    COPD (chronic obstructive pulmonary disease) (HCC)    Depression    Emphysema of lung (HCC)    Fibromyalgia    GERD (gastroesophageal reflux disease)    Hyperlipidemia    Hypertension    Hypertensive heart disease    Myalgia    Obesity    Sciatica    Seborrheic keratosis    Stress incontinence     Past  Surgical History:  Procedure Laterality Date   RIGHT HEART CATH N/A 10/07/2021   Procedure: RIGHT HEART CATH;  Surgeon: Iran Ouch, MD;  Location: [facility] INVASIVE CV LAB;  Service: Cardiovascular;  Laterality: N/A;   TONSILLECTOMY     TOTAL ABDOMINAL HYSTERECTOMY     partial    Current Medications: Current Meds  Medication Sig   albuterol (VENTOLIN HFA) 108 (90 Base) MCG/ACT inhaler INHALE 2 PUFFS INTO THE LUNGS EVERY 6 HOURS AS NEEDED FOR WHEEZING OR SHORTNESS OF BREATH   ALPRAZolam (XANAX) 0.5 MG tablet Take 1 tablet (0.5 mg total) by mouth 3 (three) times daily as needed for anxiety.   budesonide-formoterol (SYMBICORT) 160-4.5 MCG/ACT inhaler Inhale 2 puffs into the lungs 2 (two) times daily.   Calcium Carbonate-Vit D-Min (CALCIUM 1200 PO) Take by mouth daily.   cetirizine (ZYRTEC) 10 MG tablet Take 1 tablet (10 mg total) by mouth daily. (Patient taking differently: Take 10 mg by mouth as needed.)   clopidogrel (PLAVIX) 75 MG tablet TAKE 1 TABLET(75 MG) BY MOUTH DAILY   Coenzyme Q10 (CO Q 10 PO) Take 2 capsules by mouth every morning.   empagliflozin (JARDIANCE) 10 MG TABS tablet TAKE 1 TABLET(10 MG) BY MOUTH DAILY BEFORE BREAKFAST   esomeprazole (NEXIUM) 40 MG capsule TAKE ONE CAPSULE BY MOUTH TWICE DAILY (Patient taking differently: 40 mg daily.  TAKE ONE CAPSULE BY MOUTH TWICE DAILY)   Evolocumab (REPATHA SURECLICK) 140 MG/ML SOAJ Inject 140 mg into the skin every 14 (fourteen) days.   furosemide (LASIX) 40 MG tablet TAKE 1 TABLET(40 MG) BY MOUTH DAILY AS NEEDED   hydrOXYzine (ATARAX) 25 MG tablet TAKE 1 TABLET(25 MG) BY MOUTH THREE TIMES DAILY AS NEEDED (Patient taking differently: 1 tablet at night when can't sleep)   lisinopril (ZESTRIL) 10 MG tablet Take 1 tablet (10 mg total) by mouth daily. Take 1 tab daily   Magnesium 400 MG CAPS Take 550 mg by mouth. 2 in the AM and 2 in the PM   meloxicam (MOBIC) 15 MG tablet TAKE 1 TABLET(15 MG) BY MOUTH DAILY   Potassium Gluconate 595  MG CAPS Take 400 mg by mouth in the morning, at noon, in the evening, and at bedtime. Take 4 capsules daily   triamcinolone cream (KENALOG) 0.1 % Apply 1 application. topically 2 (two) times daily.   [DISCONTINUED] rosuvastatin (CRESTOR) 10 MG tablet Take 10 mg by mouth daily.     Allergies:   Morphine and codeine, Celexa [citalopram hydrobromide], Effexor [venlafaxine], Lipitor [atorvastatin], Shellfish allergy, and Wellbutrin [bupropion]   Social History   Socioeconomic History   Marital status: Married    Spouse name: Not on file   Number of children: Not on file   Years of education: Not on file   Highest education level: Not on file  Occupational History   Not on file  Tobacco Use   Smoking status: Former    Current packs/day: 0.00    Average packs/day: 1 pack/day for 40.0 years (40.0 ttl pk-yrs)    Types: Cigarettes    Start date: 11/01/1976    Quit date: 11/01/2016    Years since quitting: 5.7   Smokeless tobacco: Never   Tobacco comments:    Is trying cold-turkey with social support  Vaping Use   Vaping status: Former   Start date: 12/02/2016   Quit date: 09/06/2021  Substance and Sexual Activity   Alcohol use: No    Alcohol/week: 0.0 standard drinks of alcohol   Drug use: No   Sexual activity: Not Currently  Other Topics Concern   Not on file  Social History Narrative   Not on file   Social Determinants of Health   Financial Resource Strain: Low Risk  (10/21/2021)   Overall Financial Resource Strain (CARDIA)    Difficulty of Paying Living Expenses: Not hard at all  Food Insecurity: No Food Insecurity (02/03/2022)   Hunger Vital Sign    Worried About Running Out of Food in the Last Year: Never true    Ran Out of Food in the Last Year: Never true  Transportation Needs: No Transportation Needs (02/03/2022)   PRAPARE - Administrator, Civil Service (Medical): No    Lack of Transportation (Non-Medical): No  Physical Activity: Insufficiently Active  (10/21/2021)   Exercise Vital Sign    Days of Exercise per Week: 3 days    Minutes of Exercise per Session: 30 min  Stress: Stress Concern Present (10/21/2021)   [employer] of Occupational Health - Occupational Stress Questionnaire    Feeling of Stress : To some extent  Social Connections: Moderately Integrated (10/21/2021)   Social Connection and Isolation Panel [NHANES]    Frequency of Communication with Friends and Family: More than three times a week    Frequency of Social Gatherings with Friends and Family: Three times a week    Attends  Religious Services: More than 4 times per year    Active Member of Clubs or Organizations: No    Attends Banker Meetings: Never    Marital Status: Married     Family History: [name] family history includes Diabetes in her brother; Heart disease in her father, paternal grandfather, and paternal grandmother; Hypertension in her mother; Stroke in her maternal grandfather, maternal grandmother, and mother. There is no history of Breast cancer.  ROS:   Please see the history of present illness.     All other systems reviewed and are negative.  EKGs/Labs/Other Studies Reviewed:    The following studies were reviewed today:   EKG:  EKG not ordered today.   Recent Labs: 02/02/2022: B Natriuretic Peptide 108.5 02/26/2022: TSH 2.060 05/08/2022: Hemoglobin 13.8; Magnesium 2.2; Platelets 172 05/27/2022: ALT 14; BUN 22; Creatinine, Ser 0.85; Potassium 4.3; Sodium 142  Recent Lipid Panel    Component Value Date/Time   CHOL 222 (H) 06/27/2022 1022   CHOL 142 01/05/2015 1105   TRIG 128 06/27/2022 1022   TRIG 138 01/05/2015 1105   HDL 44 06/27/2022 1022   CHOLHDL 5.0 (H) 06/27/2022 1022   CHOLHDL 4.0 11/25/2021 0801   VLDL 29 11/25/2021 0801   VLDL 28 01/05/2015 1105   LDLCALC 155 (H) 06/27/2022 1022   LDLDIRECT 90 05/08/2022 1214     Risk Assessment/Calculations:          Physical Exam:    VS:  BP 130/78 (BP  Location: Left Arm, Patient Position: Sitting, Cuff Size: Normal)   Pulse (!) 56   Ht 5\' 6"  (1.676 m)   Wt 179 lb 3.2 oz (81.3 kg)   LMP  (LMP Unknown)   SpO2 98%   BMI 28.92 kg/m     Wt Readings from Last 3 Encounters:  08/07/22 179 lb 3.2 oz (81.3 kg)  08/05/22 177 lb 9.6 oz (80.6 kg)  07/28/22 179 lb (81.2 kg)     GEN:  Well nourished, well developed in no acute distress HEENT: Normal NECK: No JVD; No carotid bruits LYMPHATICS: No lymphadenopathy CARDIAC: RRR, no murmurs, rubs, gallops RESPIRATORY: Diminished breath sounds, no wheezing. ABDOMEN: Soft, non-tender, non-distended MUSCULOSKELETAL:  No edema; No deformity  SKIN: Warm and dry NEUROLOGIC:  Alert and oriented x 3 PSYCHIATRIC:  Normal affect   ASSESSMENT:    1. Coronary artery disease involving native coronary artery of native heart without angina pectoris   2. Mixed hyperlipidemia   3. Pulmonary hypertension, unspecified (HCC)   4. Primary hypertension    PLAN:    In order of problems listed above:  CAD, RCA calcification on chest CT.  EF 60 to 65%.  Denies chest pain.  Continue Plavix, Repatha. Hyperlipidemia, intolerant to statins.  Stop Crestor.  Continue Repatha.  Obtain lipid profile in 1 month. Pulmonary hypertension, moderate.  Likely WHO class III (COPD ) versus WHO class II (grade 2 diastolic dysfunction ), Denies edema, continue Lasix as needed.   Diastolic dysfunction, appears euvolemic, continue Lasix as needed. Hypertension, BP controlled.  Continue lisinopril 10 mg daily.  Follow-up in 6 months     Medication Adjustments/Labs and Tests Ordered: Current medicines are reviewed at length with the patient today.  Concerns regarding medicines are outlined above.  Orders Placed This Encounter  Procedures   Lipid panel   No orders of the defined types were placed in this encounter.   Patient Instructions  Medication Instructions:   STOP Crestor  *If you need a refill  on your cardiac  medications before your next appointment, please call your pharmacy*   Lab Work:  Your physician recommends that you return for lab work in: 1 month at the medical mall. You will need to be fasting.  No appt is needed. Hours are M-F 7AM- 6 PM.   If you have labs (blood work) drawn today and your tests are completely normal, you will receive your results only by: MyChart Message (if you have MyChart) OR A paper copy in the mail If you have any lab test that is abnormal or we need to change your treatment, we will call you to review the results.   Testing/Procedures:  None Ordered   Follow-Up: At [facility], you and your health needs are our priority.  As part of our continuing mission to provide you with exceptional heart care, we have created designated Provider Care Teams.  These Care Teams include your primary Cardiologist (physician) and Advanced Practice Providers (APPs -  Physician Assistants and Nurse Practitioners) who all work together to provide you with the care you need, when you need it.  We recommend signing up for the patient portal called "MyChart".  Sign up information is provided on this After Visit Summary.  MyChart is used to connect with patients for Virtual Visits (Telemedicine).  Patients are able to view lab/test results, encounter notes, upcoming appointments, etc.  Non-urgent messages can be sent to your provider as well.   To learn more about what you can do with MyChart, go to [email or web address].    Your next appointment:   6 month(s)  Provider:   You may see Debbe Odea, MD or one of the following Advanced Practice Providers on your designated Care Team:   Nicolasa Ducking, NP Eula Listen, PA-C Cadence Fransico Michael, PA-C Charlsie Quest, NP   Signed, Debbe Odea, MD  08/07/2022 9:12 AM    [facility]

## 2022-08-07 NOTE — Patient Instructions (Addendum)
Medication Instructions:   STOP Crestor  *If you need a refill on your cardiac medications before your next appointment, please call your pharmacy*   Lab Work:  Your physician recommends that you return for lab work in: 1 [facility] the [facility] mall. You will need to be fasting.  No appt is needed. Hours are M-F 7AM- 6 PM.   If you have labs (blood work) drawn today and your tests are completely normal, you will receive your results only by: MyChart Message (if you have MyChart) OR A paper copy in the mail If you have any lab test that is abnormal or we need to change your treatment, we will call you to review the results.   Testing/Procedures:  None Ordered   Follow-Up: At [facility], you and your health needs are our priority.  As part of our continuing mission to provide you with exceptional heart care, we have created designated Provider Care Teams.  These Care Teams include your primary Cardiologist (physician) and Advanced Practice Providers (APPs -  Physician Assistants and Nurse Practitioners) who all work together to provide you with the care you need, when you need it.  We recommend signing up for the patient portal called "MyChart".  Sign up information is provided on this After Visit Summary.  MyChart is used to connect with patients for Virtual Visits (Telemedicine).  Patients are able to view lab/test results, encounter notes, upcoming appointments, etc.  Non-urgent messages can be sent to your provider as well.   To learn more about what you can do with MyChart, go to [email or web address].    Your next appointment:   6 month(s)  Provider:   You may see Debbe Odea, MD or one of the following Advanced Practice Providers on your designated Care Team:   Nicolasa Ducking, NP Eula Listen, PA-C Cadence Fransico Michael, PA-C Charlsie Quest, NP

## 2022-08-08 ENCOUNTER — Ambulatory Visit: Payer: Self-pay

## 2022-08-08 DIAGNOSIS — J441 Chronic obstructive pulmonary disease with (acute) exacerbation: Secondary | ICD-10-CM

## 2022-08-08 DIAGNOSIS — R058 Other specified cough: Secondary | ICD-10-CM

## 2022-08-08 NOTE — Telephone Encounter (Signed)
Chief Complaint: Continued cough Symptoms: productive cough, runny nose, hoarse voice Frequency: 08/04/22  Pertinent Negatives: Patient denies pain Disposition: [] ED /[] Urgent Care (no appt availability in office) / [] Appointment(In office/virtual)/ []  [facility]/ [x] Home Care/ [] Refused Recommended Disposition /[] [facility]/ [x]  Follow-up with PCP Additional Notes: Patient stated that she was seen in office on 08/05/22 and was told if symptoms did not improve to call the office to request an antibiotic be sent to her local pharmacy. Patient states she still has a productive cough, runny nose, hoarseness and feels tired. Patient would like antibiotic to be sent to [facility] in [place]. Advised patient I would forward the message to her provider for recommendations.   Advised if symptoms get worse to go to UC. Patient verbalized understanding. Summary: Pt requests Rx for an antibiotic   Pt stated she saw Erin Mecum this week and she was told if she did not get better to call and request an antibiotic. Pt requests call back. Cb# [phone]     Reason for Disposition  Cough with cold symptoms (e.g., runny nose, postnasal drip, throat clearing)  Answer Assessment - Initial Assessment Questions 1. ONSET: "When did the cough begin?"      08/04/22 2. SEVERITY: "How bad is the cough today?"      It is getting worse since my office visit with Erin 3. SPUTUM: "Describe the color of your sputum" (none, dry cough; clear, white, yellow, green)     Army green color 4. HEMOPTYSIS: "Are you coughing up any blood?" If so ask: "How much?" (flecks, streaks, tablespoons, etc.)     No 5. DIFFICULTY BREATHING: "Are you having difficulty breathing?" If Yes, ask: "How bad is it?" (e.g., mild, moderate, severe)    - MILD: No SOB at rest, mild SOB with walking, speaks normally in sentences, can lie down, no retractions, pulse < 100.    - MODERATE: SOB at rest, SOB with minimal exertion  and prefers to sit, cannot lie down flat, speaks in phrases, mild retractions, audible wheezing, pulse 100-120.    - SEVERE: Very SOB at rest, speaks in single words, struggling to breathe, sitting hunched forward, retractions, pulse > 120      No 6. FEVER: "Do you have a fever?" If Yes, ask: "What is your temperature, how was it measured, and when did it start?"     I feel like I have a fever but I don't have a way to check it      8. LUNG HISTORY: "Do you have any history of lung disease?"  (e.g., pulmonary embolus, asthma, emphysema)     COPD 9. PE RISK FACTORS: "Do you have a history of blood clots?" (or: recent major surgery, recent prolonged travel, bedridden)     Yes this past January 2024 10. OTHER SYMPTOMS: "Do you have any other symptoms?" (e.g., runny nose, wheezing, chest pain)     Nose is running and hoarseness, tired sneezing  Protocols used: Cough - Acute Productive-A-AH

## 2022-08-11 MED ORDER — AMOXICILLIN-POT CLAVULANATE 875-125 MG PO TABS
1.0000 | ORAL_TABLET | Freq: Two times a day (BID) | ORAL | 0 refills | Status: DC
Start: 2022-08-11 — End: 2022-08-25

## 2022-08-11 NOTE — Telephone Encounter (Signed)
I can send in a cough medication called Benzonatate for her to take 2 times per day if she would prefer this over the Robitussin

## 2022-08-11 NOTE — Telephone Encounter (Signed)
Patient was made aware of Erin Mecum's recommendations. Patient is asking if there is any prescription cough medicine the provider can send over to the pharmacy than is stronger than Robitussin. Please advise?

## 2022-08-11 NOTE — Telephone Encounter (Signed)
Augmentin sent in per request. If she is not feeling better towards the end of the antibiotic she will need an office apt to recheck her lungs and potential chest xray to rule out pneumonia.

## 2022-08-11 NOTE — Addendum Note (Signed)
Addended by: Jacquelin Hawking on: 08/11/2022 09:55 AM   Modules accepted: Orders

## 2022-08-12 MED ORDER — DIMETAPP LONG ACT COUGH/COLD 1-7.5 MG/5ML PO SYRP
10.0000 mL | ORAL_SOLUTION | Freq: Four times a day (QID) | ORAL | 0 refills | Status: AC | PRN
Start: 2022-08-12 — End: ?

## 2022-08-12 MED ORDER — BENZONATATE 200 MG PO CAPS
200.0000 mg | ORAL_CAPSULE | Freq: Two times a day (BID) | ORAL | 0 refills | Status: DC | PRN
Start: 2022-08-12 — End: 2022-08-25

## 2022-08-12 NOTE — Addendum Note (Signed)
Addended by: Jacquelin Hawking on: 08/12/2022 01:14 PM   Modules accepted: Orders

## 2022-08-12 NOTE — Telephone Encounter (Signed)
Spoke with patient and she says she would like to have the prescription sent over to her pharmacy as she had a coughing spell in [place] yesterday, and she says the employees had to get her a chair to sit down and grab her some water and almost had to call the EMS. Patient says she is wondering if provider could prescribed Dimetapp as Larae Grooms has prescribed it before. Please advise?

## 2022-08-14 ENCOUNTER — Telehealth: Payer: Self-pay | Admitting: Internal Medicine

## 2022-08-14 ENCOUNTER — Telehealth: Payer: Self-pay | Admitting: Nurse Practitioner

## 2022-08-14 DIAGNOSIS — G4733 Obstructive sleep apnea (adult) (pediatric): Secondary | ICD-10-CM

## 2022-08-14 NOTE — Telephone Encounter (Signed)
Pt is calling to request an order for a sleep study. Pt reports that her CPAP machine is broken. Please advise CB- [phone]

## 2022-08-14 NOTE — Telephone Encounter (Signed)
Called and spoke to patient. Advised her that since she was now seeing Pulmonology, that they would be the ones to order a sleep study for her. Phone number to [facility] provided to the patient to contact their office.

## 2022-08-14 NOTE — Telephone Encounter (Signed)
Pt calling in w questions for Dr. Belia Heman

## 2022-08-14 NOTE — Telephone Encounter (Signed)
Yes pulmonology should handle her sleep study as they are providing CPAP management as well

## 2022-08-14 NOTE — Telephone Encounter (Signed)
Routing to provider to advise. On chart review, patient sees Pulmonary. Should this request be sent to them instead?

## 2022-08-15 NOTE — Telephone Encounter (Signed)
Called and spoke to patient.  She is requesting order for new cpap machine. Current machine is >69 years old.  Dr. Belia Heman, please advise. Thanks

## 2022-08-18 NOTE — Telephone Encounter (Signed)
Order placed for new cpap machine.  Patient is aware and voiced her understanding. She prefers adapt. Nothing further needed

## 2022-08-18 NOTE — Telephone Encounter (Signed)
Lm x1 for patient.  

## 2022-08-25 ENCOUNTER — Ambulatory Visit (INDEPENDENT_AMBULATORY_CARE_PROVIDER_SITE_OTHER): Payer: 59 | Admitting: Physician Assistant

## 2022-08-25 ENCOUNTER — Encounter: Payer: Self-pay | Admitting: Physician Assistant

## 2022-08-25 VITALS — BP 109/63 | HR 54 | Ht 66.0 in | Wt 174.6 lb

## 2022-08-25 DIAGNOSIS — I700 Atherosclerosis of aorta: Secondary | ICD-10-CM

## 2022-08-25 DIAGNOSIS — E782 Mixed hyperlipidemia: Secondary | ICD-10-CM

## 2022-08-25 DIAGNOSIS — R7303 Prediabetes: Secondary | ICD-10-CM | POA: Insufficient documentation

## 2022-08-25 DIAGNOSIS — I10 Essential (primary) hypertension: Secondary | ICD-10-CM | POA: Diagnosis not present

## 2022-08-25 DIAGNOSIS — I509 Heart failure, unspecified: Secondary | ICD-10-CM | POA: Diagnosis not present

## 2022-08-25 DIAGNOSIS — I714 Abdominal aortic aneurysm, without rupture, unspecified: Secondary | ICD-10-CM

## 2022-08-25 DIAGNOSIS — N898 Other specified noninflammatory disorders of vagina: Secondary | ICD-10-CM

## 2022-08-25 DIAGNOSIS — Z638 Other specified problems related to primary support group: Secondary | ICD-10-CM

## 2022-08-25 HISTORY — DX: Prediabetes: R73.03

## 2022-08-25 LAB — WET PREP FOR TRICH, YEAST, CLUE
Clue Cell Exam: NEGATIVE
Trichomonas Exam: NEGATIVE
Yeast Exam: NEGATIVE

## 2022-08-25 MED ORDER — ALPRAZOLAM 0.5 MG PO TABS
0.5000 mg | ORAL_TABLET | Freq: Three times a day (TID) | ORAL | 0 refills | Status: AC | PRN
Start: 2022-08-25 — End: ?

## 2022-08-25 NOTE — Assessment & Plan Note (Signed)
Chronic, historic condition Appears well managed at this time  She reports seeing Cardiology and Vascular specialties on routine basis for monitoring She is taking Plavix for management  Continue current regimen Follow up in 3 months or sooner if concerns arise

## 2022-08-25 NOTE — Assessment & Plan Note (Signed)
Chronic, historic condition She has been taken off statin therapy by Cardiology and is taking Repatha at this time She is also on Plavix - continue current regimen  Recheck lipid panel today Follow up in 3 months or sooner if concerns arise

## 2022-08-25 NOTE — Progress Notes (Signed)
Your cervicovaginal swab was negative for yeast, trichomonas, and bacterial vaginosis. No medications are indicated at this time.

## 2022-08-25 NOTE — Assessment & Plan Note (Signed)
Chronic, historic condition Patient was recently taken off statin therapy due to myalgias and is taking Repatha at this time She has also made dietary changes which she believes are helping with weight loss Recommend continued efforts in regards to lifestyle changes and continue with Repatha Recheck cholesterol today- continue collaboration with Cardiology  Follow up in 6 months or sooner if concerns arise

## 2022-08-25 NOTE — Assessment & Plan Note (Signed)
Chronic.  Following with up with Cardiology.  Continue with Lasix.  Continue to follow their recommendations.   - Reminded to call for an overnight weight gain of >2 pounds or a weekly weight gain of >5 pounds - not adding salt to food and read food labels. Reviewed the importance of keeping daily sodium intake to <2000mg daily. - Avoid Ibuprofen products.  

## 2022-08-25 NOTE — Assessment & Plan Note (Signed)
Chronic, historic, ongoing  She has been taking PRN Xanax 0.5 mg PO PRN and reports this is very helpful for acute panic/anxiety attacks PDMP reviewed, no evidence of worrisome controlled substance use patterns at this time  Refill provided today  She is not interested in starting  a maintenance medication at this time but discussed that if stressors are continued this may be an option for optimal control.  Follow up as needed for persistent or progressing symptoms

## 2022-08-25 NOTE — Assessment & Plan Note (Signed)
Likely chronic; Most recent A1c was 5.8  Recheck today- results to dictate further management

## 2022-08-25 NOTE — Progress Notes (Signed)
Established office visit    Patient: Brandi Steele   DOB: 02-Oct-1955   67 y.o. Female  MRN: [id number] Visit Date: 08/25/2022  Today's healthcare provider: Oswaldo Conroy Saahir Prude, PA-C  Introduced myself to the patient as a Secondary school teacher and provided education on APPs in clinical practice.    Chief Complaint  Patient presents with   Vaginitis    Patient says she thinks she is having symptoms of itching from the recent prescription of her Amoxicillin.    Stress   Subjective    HPI HPI     Vaginitis    Additional comments: Patient says she thinks she is having symptoms of itching from the recent prescription of her Amoxicillin.       Last edited by Malen Gauze, CMA on 08/25/2022  9:04 AM.      Concern for vaginal irritation   Onset: gradual Duration: started about 2 days ago  Associated symptoms: she denies dysuria, vaginal discharge changes, vaginal bleeding  Interventions:nothing     HLD  She has been taken off her Rosuvastatin but Cardiology provider due to myalgias and pain She is still taking her Repatha  She reports she has changed her diet - has cut out carbs and eats very little meat, has stopped eating regular cheese  She thinks she is losing weight some     AAA  She is taking Plavix for management  She is followed regularly by Cardiology and Vascular services    She reports her  breathing is doing much better after the round of abx  She is waiting on new CPAP- reports it has been approved but she is waiting on paperwork to be completed   Stress She reports significant caregiver stress She is taking care of her husband and her brother and report this is stressful at times She occasionally uses Xanax for more severe panic attacks or when feeling overwhelmed  She states she has not used at all this month  She states she has tried Zoloft in the past but did not continue due to side effects  She does not like the idea of being on daily medications at this  time     Medications: Outpatient Medications Prior to Visit  Medication Sig   albuterol (VENTOLIN HFA) 108 (90 Base) MCG/ACT inhaler INHALE 2 PUFFS INTO THE LUNGS EVERY 6 HOURS AS NEEDED FOR WHEEZING OR SHORTNESS OF BREATH   budesonide-formoterol (SYMBICORT) 160-4.5 MCG/ACT inhaler Inhale 2 puffs into the lungs 2 (two) times daily.   Calcium Carbonate-Vit D-Min (CALCIUM 1200 PO) Take by mouth daily.   cetirizine (ZYRTEC) 10 MG tablet Take 1 tablet (10 mg total) by mouth daily. (Patient taking differently: Take 10 mg by mouth as needed.)   Chlorpheniramine-DM (DIMETAPP LONG ACT COUGH/COLD) 1-7.5 MG/5ML SYRP Take 10 mLs by mouth every 6 (six) hours as needed.   clopidogrel (PLAVIX) 75 MG tablet TAKE 1 TABLET(75 MG) BY MOUTH DAILY   Coenzyme Q10 (CO Q 10 PO) Take 2 capsules by mouth every morning.   empagliflozin (JARDIANCE) 10 MG TABS tablet TAKE 1 TABLET(10 MG) BY MOUTH DAILY BEFORE BREAKFAST   esomeprazole (NEXIUM) 40 MG capsule TAKE ONE CAPSULE BY MOUTH TWICE DAILY (Patient taking differently: 40 mg daily. TAKE ONE CAPSULE BY MOUTH TWICE DAILY)   Evolocumab (REPATHA SURECLICK) 140 MG/ML SOAJ Inject 140 mg into the skin every 14 (fourteen) days.   furosemide (LASIX) 40 MG tablet TAKE 1 TABLET(40 MG) BY MOUTH DAILY  AS NEEDED   hydrOXYzine (ATARAX) 25 MG tablet TAKE 1 TABLET(25 MG) BY MOUTH THREE TIMES DAILY AS NEEDED (Patient taking differently: 1 tablet at night when can't sleep)   lisinopril (ZESTRIL) 10 MG tablet Take 1 tablet (10 mg total) by mouth daily. Take 1 tab daily   Magnesium 400 MG CAPS Take 550 mg by mouth. 2 in the AM and 2 in the PM   meloxicam (MOBIC) 15 MG tablet TAKE 1 TABLET(15 MG) BY MOUTH DAILY   Potassium Gluconate 595 MG CAPS Take 400 mg by mouth in the morning, at noon, in the evening, and at bedtime. Take 4 capsules daily   triamcinolone cream (KENALOG) 0.1 % Apply 1 application. topically 2 (two) times daily.   [DISCONTINUED] ALPRAZolam (XANAX) 0.5 MG tablet Take 1  tablet (0.5 mg total) by mouth 3 (three) times daily as needed for anxiety.   [DISCONTINUED] amoxicillin-clavulanate (AUGMENTIN) 875-125 MG tablet Take 1 tablet by mouth 2 (two) times daily. (Patient not taking: Reported on 08/25/2022)   [DISCONTINUED] benzonatate (TESSALON) 200 MG capsule Take 1 capsule (200 mg total) by mouth 2 (two) times daily as needed for cough. (Patient not taking: Reported on 08/25/2022)   Facility-Administered Medications Prior to Visit  Medication Dose Route Frequency Provider   albuterol (PROVENTIL) (2.5 MG/3ML) 0.083% nebulizer solution 2.5 mg  2.5 mg Nebulization Once Bensimhon, Bevelyn Buckles, MD    Review of Systems  Genitourinary:  Positive for vaginal pain. Negative for difficulty urinating, dysuria and flank pain.         Objective    BP 109/63   Pulse (!) 54   Ht 5\' 6"  (1.676 m)   Wt 174 lb 9.6 oz (79.2 kg)   LMP  (LMP Unknown)   SpO2 93%   BMI 28.18 kg/m      Physical Exam Vitals reviewed.  Constitutional:      General: She is awake.     Appearance: Normal appearance. She is well-developed and well-groomed.  HENT:     Head: Normocephalic and atraumatic.  Cardiovascular:     Rate and Rhythm: Normal rate and regular rhythm.     Pulses: Normal pulses.          Radial pulses are 2+ on the right side and 2+ on the left side.     Heart sounds: Normal heart sounds. No murmur heard.    No friction rub. No gallop.  Pulmonary:     Effort: Pulmonary effort is normal.     Breath sounds: Normal breath sounds. No decreased air movement. No decreased breath sounds, wheezing, rhonchi or rales.  Musculoskeletal:     Cervical back: Normal range of motion.     Right lower leg: No edema.     Left lower leg: No edema.  Skin:    General: Skin is warm and dry.  Neurological:     General: No focal deficit present.     Mental Status: She is alert and oriented to person, place, and time.  Psychiatric:        Mood and Affect: Mood normal.        Behavior:  Behavior normal. Behavior is cooperative.        Thought Content: Thought content normal.        Judgment: Judgment normal.       Results for orders placed or performed in visit on 08/25/22  WET PREP FOR TRICH, YEAST, CLUE   Specimen: Sterile Swab   Sterile Swab  Result Value Ref  Range   Trichomonas Exam Negative Negative   Yeast Exam Negative Negative   Clue Cell Exam Negative Negative    Assessment & Plan      Return in about 3 months (around 11/25/2022) for HTN, HLD, prediabetes.       Problem List Items Addressed This Visit       Cardiovascular and Mediastinum   CHF (congestive heart failure) (HCC) - Primary    Chronic.  Following with up with Cardiology.  Continue with Lasix.  Continue to follow their recommendations.   - Reminded to call for an overnight weight gain of >2 pounds or a weekly weight gain of >5 pounds - not adding salt to food and read food labels. Reviewed the importance of keeping daily sodium intake to 2000mg  daily. - Avoid Ibuprofen products.       Relevant Orders   Comp Met (CMET)   Essential hypertension    Chronic, historic condition Appears well managed on current regimen comprised of lisinopril 10 mg PO every day  Continue current regimen Continue collaboration with Cardiology  Follow up in 6 months or sooner if concerns arise        Relevant Orders   Comp Met (CMET)   Aortic atherosclerosis (HCC)    Chronic, historic condition She has been taken off statin therapy by Cardiology and is taking Repatha at this time She is also on Plavix - continue current regimen  Recheck lipid panel today Follow up in 3 months or sooner if concerns arise        Relevant Orders   Lipid Profile   Abdominal aortic aneurysm (AAA) without rupture (HCC)    Chronic, historic condition Appears well managed at this time  She reports seeing Cardiology and Vascular specialties on routine basis for monitoring She is taking Plavix for management   Continue current regimen Follow up in 3 months or sooner if concerns arise         Other   Hyperlipidemia    Chronic, historic condition Patient was recently taken off statin therapy due to myalgias and is taking Repatha at this time She has also made dietary changes which she believes are helping with weight loss Recommend continued efforts in regards to lifestyle changes and continue with Repatha Recheck cholesterol today- continue collaboration with Cardiology  Follow up in 6 months or sooner if concerns arise        Relevant Orders   Lipid Profile   Hypomagnesemia    Recheck labs today She is taking supplementation Results of labs to dictate further management      Relevant Orders   Magnesium   Caregiver role strain    Chronic, historic, ongoing  She has been taking PRN Xanax 0.5 mg PO PRN and reports this is very helpful for acute panic/anxiety attacks PDMP reviewed, no evidence of worrisome controlled substance use patterns at this time  Refill provided today  She is not interested in starting  a maintenance medication at this time but discussed that if stressors are continued this may be an option for optimal control.  Follow up as needed for persistent or progressing symptoms        Relevant Medications   ALPRAZolam (XANAX) 0.5 MG tablet   Prediabetes    Likely chronic; Most recent A1c was 5.8  Recheck today- results to dictate further management        Relevant Orders   HgB A1c   Other Visit Diagnoses     Vaginal irritation  Acute, new concern She reports vaginal itching and irritation since she finished her recent Abx Wet prep was negative for trich, yeast and BV Results to be sent via Mychart/telephone  Follow up as needed for persistent or progressing symptoms     Relevant Orders   WET PREP FOR TRICH, YEAST, CLUE (Completed)        Return in about 3 months (around 11/25/2022) for HTN, HLD, prediabetes.   I, Lowery Paullin E Oather Muilenburg, PA-C, have  reviewed all documentation for this visit. The documentation on 08/25/22 for the exam, diagnosis, procedures, and orders are all accurate and complete.   Jacquelin Hawking, MHS, PA-C [facility]

## 2022-08-25 NOTE — Assessment & Plan Note (Signed)
Chronic, historic condition Appears well managed on current regimen comprised of lisinopril 10 mg PO every day  Continue current regimen Continue collaboration with Cardiology  Follow up in 6 months or sooner if concerns arise

## 2022-08-25 NOTE — Assessment & Plan Note (Signed)
Recheck labs today She is taking supplementation Results of labs to dictate further management

## 2022-08-26 ENCOUNTER — Ambulatory Visit: Payer: 59 | Admitting: Physician Assistant

## 2022-08-28 NOTE — Progress Notes (Signed)
Your labs are back Your electrolytes, liver and kidney function are overall in normal ranges Your cholesterol looks great and has significantly improved since your results 2 months ago Your A1c was 5.5 which is improved from 3 months ago  We are still waiting on your magnesium results and will keep you updated once they are back

## 2022-09-01 ENCOUNTER — Encounter: Payer: 59 | Admitting: Cardiology

## 2022-09-15 ENCOUNTER — Other Ambulatory Visit
Admission: RE | Admit: 2022-09-15 | Discharge: 2022-09-15 | Disposition: A | Discharge status: Home / Self Care | Payer: 59 | Source: Ambulatory Visit | Attending: Cardiology | Admitting: Cardiology

## 2022-09-15 DIAGNOSIS — E782 Mixed hyperlipidemia: Secondary | ICD-10-CM | POA: Insufficient documentation

## 2022-09-15 LAB — LIPID PANEL
Cholesterol: 103 mg/dL (ref 0–200)
HDL: 39 mg/dL — ABNORMAL LOW (ref >40)
LDL Cholesterol: 42 mg/dL (ref 0–99)
Total CHOL/HDL Ratio: 2.6 RATIO
Triglycerides: 109 mg/dL (ref <150)
VLDL: 22 mg/dL (ref 0–40)

## 2022-09-25 ENCOUNTER — Ambulatory Visit: Payer: 59 | Attending: Cardiology | Admitting: Internal Medicine

## 2022-09-25 ENCOUNTER — Encounter: Payer: Self-pay | Admitting: Internal Medicine

## 2022-09-25 VITALS — BP 128/70 | HR 55 | Wt 172.4 lb

## 2022-09-25 DIAGNOSIS — I272 Pulmonary hypertension, unspecified: Secondary | ICD-10-CM

## 2022-09-25 DIAGNOSIS — I5032 Chronic diastolic (congestive) heart failure: Secondary | ICD-10-CM

## 2022-09-25 DIAGNOSIS — I2699 Other pulmonary embolism without acute cor pulmonale: Secondary | ICD-10-CM

## 2022-09-25 DIAGNOSIS — I2510 Atherosclerotic heart disease of native coronary artery without angina pectoris: Secondary | ICD-10-CM

## 2022-09-25 NOTE — Progress Notes (Signed)
ADVANCED HF CLINIC CONSULT NOTE  Referring Physician:Brian Agbor-Etang, MD Primary Care: Larae Grooms, NP Primary Cardiologist: Debbe Odea, MD   HPI:  Brandi Steele is a 67 y.o. female with a hx of CAD (RCA calcium on chest CT), HTN, HL, former smoker x 40+ years, pulmonary hypertension, COPD, chronic hypoxic respiratory failure on home O2, OSA on CPAP, PAD s/p abdominal aortic surgery over 20 years ago and chronic diastolic HF who presents for follow-up. Referred by Dr. Areatha Keas for further evaluation of her pulmonary HTN.    Echo 03/2021 ([facility]) EF>55% , moderate TR, severe pulmonary hypertension RVSP . Echo 07/2021 EF 60- 65%, severe pulmonary hypertension, RVSP 72.4 mmHg RV mildly reduced  Right heart cath 09/2021  RA 5  PA 56/24 (35) PCWP 18 PVR 2.6 WU Fick 6.6/3.5  CT chest: 6/23: No PE + mild emphysema  No PFTs on chart  Treadmill stress test 03/2021 showed no significant ischemic EKG changes.  Previously followed by Dr,. Gwen Pounds and recently transitioned care to Dr. Azucena Cecil,   Has seen Dr. 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 Encouraged to be more compliant with home O2 and encouraged her to proceed with RHC. Stopped smoking 4 years ago but started vaping due to high stress. Stopped vaping 6 months ago/  Lives in [place]. Helps take of her husband who has traumatic brain injury. Also takes care of her brother who lives with her and had MVA at age 53 and needs FT care. Also cares for her 2 y/o great granddaughter.   I saw her for first time in 11/23 for PAH. 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 PFTs with DLCO ordered. Stressed need to be complaint with home O2 and CPAP to keep sats > 90% at all times  Admitted in 1/24 for diastolic HF. BNP minimally elevated at 100 but chest x-ray showed diffuse interstitial opacities suggesting edema. Treated with IV lasix.  CTA chest showed multiple tiny filling defects  consistent with pulmonary emboli. Started on Eliquis   Echo 1/24: EF 60-65% RV mildly reduced . No TR to estimate RVSP.   PFTs 01/23/22 with moderate obstruction FEV1 1.5 (62%)  FVC 2.57 (80%) Ratio 59% DLCO 52%  Here for routine f/u. Taking care of her husband who is dying from metastatic breast cancer and dementia. Breathing much better. Fluid well controlled. Has improved diet. Taking Jardiance. Very active. Has lost nearly 20 pounds    Past Medical History:  Diagnosis Date   Anxiety    Aortic stenosis    CHF (congestive heart failure) (HCC)    COPD (chronic obstructive pulmonary disease) (HCC)    Depression    Emphysema of lung (HCC)    Fibromyalgia    GERD (gastroesophageal reflux disease)    Hyperlipidemia    Hypertension    Hypertensive heart disease    Myalgia    Obesity    Sciatica    Seborrheic keratosis    Stress incontinence     Current Outpatient Medications  Medication Sig Dispense Refill   albuterol (VENTOLIN HFA) 108 (90 Base) MCG/ACT inhaler INHALE 2 PUFFS INTO THE LUNGS EVERY 6 HOURS AS NEEDED FOR WHEEZING OR SHORTNESS OF BREATH 18 g 1   ALPRAZolam (XANAX) 0.5 MG tablet Take 1 tablet (0.5 mg total) by mouth 3 (three) times daily as needed for anxiety. 30 tablet 0   budesonide-formoterol (SYMBICORT) 160-4.5 MCG/ACT inhaler Inhale 2 puffs into the lungs 2 (two)  times daily. 1 each 5   Calcium Carbonate-Vit D-Min (CALCIUM 1200 PO) Take by mouth daily.     cetirizine (ZYRTEC) 10 MG tablet Take 1 tablet (10 mg total) by mouth daily. (Patient taking differently: Take 10 mg by mouth as needed.) 30 tablet 11   Chlorpheniramine-DM (DIMETAPP LONG ACT COUGH/COLD) 1-7.5 MG/5ML SYRP Take 10 mLs by mouth every 6 (six) hours as needed. 118 mL 0   clopidogrel (PLAVIX) 75 MG tablet TAKE 1 TABLET(75 MG) BY MOUTH DAILY 90 tablet 0   Coenzyme Q10 (CO Q 10 PO) Take 2 capsules by mouth every morning.     empagliflozin (JARDIANCE) 10 MG TABS tablet TAKE 1 TABLET(10 MG) BY MOUTH  DAILY BEFORE BREAKFAST 30 tablet 11   esomeprazole (NEXIUM) 40 MG capsule TAKE ONE CAPSULE BY MOUTH TWICE DAILY (Patient taking differently: 40 mg daily. TAKE ONE CAPSULE BY MOUTH TWICE DAILY) 180 capsule 2   Evolocumab (REPATHA SURECLICK) 140 MG/ML SOAJ Inject 140 mg into the skin every 14 (fourteen) days. 6 mL 3   furosemide (LASIX) 40 MG tablet TAKE 1 TABLET(40 MG) BY MOUTH DAILY AS NEEDED 30 tablet 2   hydrOXYzine (ATARAX) 25 MG tablet TAKE 1 TABLET(25 MG) BY MOUTH THREE TIMES DAILY AS NEEDED (Patient taking differently: 1 tablet at night when can't sleep) 90 tablet 2   lisinopril (ZESTRIL) 10 MG tablet Take 1 tablet (10 mg total) by mouth daily. Take 1 tab daily 90 tablet 1   Magnesium 400 MG CAPS Take 550 mg by mouth. 2 in the AM and 2 in the PM     meloxicam (MOBIC) 15 MG tablet TAKE 1 TABLET(15 MG) BY MOUTH DAILY 90 tablet 3   Potassium Gluconate 595 MG CAPS Take 400 mg by mouth in the morning, at noon, in the evening, and at bedtime. Take 4 capsules daily     triamcinolone cream (KENALOG) 0.1 % Apply 1 application. topically 2 (two) times daily. 80 g 1   No current facility-administered medications for this visit.   Facility-Administered Medications Ordered in Other Visits  Medication Dose Route Frequency Provider Last Rate Last Admin   albuterol (PROVENTIL) (2.5 MG/3ML) 0.083% nebulizer solution 2.5 mg  2.5 mg Nebulization Once [place] Torok, Bevelyn Buckles, MD        Allergies  Allergen Reactions   Morphine And Codeine Nausea And Vomiting   Celexa [Citalopram Hydrobromide] Other (See Comments)    Pruritis    Effexor [Venlafaxine] Other (See Comments)    Panic attack   Lipitor [Atorvastatin] Other (See Comments)    Myalgias   Shellfish Allergy Hives and Swelling   Wellbutrin [Bupropion] Anxiety      Social History   Socioeconomic History   Marital status: Married    Spouse name: Not on file   Number of children: Not on file   Years of education: Not on file   Highest education  level: Not on file  Occupational History   Not on file  Tobacco Use   Smoking status: Former    Current packs/day: 0.00    Average packs/day: 1 pack/day for 40.0 years (40.0 ttl pk-yrs)    Types: Cigarettes    Start date: 11/01/1976    Quit date: 11/01/2016    Years since quitting: 5.9   Smokeless tobacco: Never   Tobacco comments:    Is trying cold-turkey with social support  Vaping Use   Vaping status: Former   Start date: 12/02/2016   Quit date: 09/06/2021  Substance and Sexual Activity  Alcohol use: No    Alcohol/week: 0.0 standard drinks of alcohol   Drug use: No   Sexual activity: Not Currently  Other Topics Concern   Not on file  Social History Narrative   Not on file   Social Determinants of Health   Financial Resource Strain: Low Risk  (10/21/2021)   Overall Financial Resource Strain (CARDIA)    Difficulty of Paying Living Expenses: Not hard at all  Food Insecurity: No Food Insecurity (02/03/2022)   Hunger Vital Sign    Worried About Running Out of Food in the Last Year: Never true    Ran Out of Food in the Last Year: Never true  Transportation Needs: No Transportation Needs (02/03/2022)   PRAPARE - Administrator, Civil Service (Medical): No    Lack of Transportation (Non-Medical): No  Physical Activity: Insufficiently Active (10/21/2021)   Exercise Vital Sign    Days of Exercise per Week: 3 days    Minutes of Exercise per Session: 30 min  Stress: Stress Concern Present (10/21/2021)   [employer] of Occupational Health - Occupational Stress Questionnaire    Feeling of Stress : To some extent  Social Connections: Moderately Integrated (10/21/2021)   Social Connection and Isolation Panel [NHANES]    Frequency of Communication with Friends and Family: More than three times a week    Frequency of Social Gatherings with Friends and Family: Three times a week    Attends Religious Services: More than 4 times per year    Active Member of Clubs or  Organizations: No    Attends Banker Meetings: Never    Marital Status: Married  Catering manager Violence: Not At Risk (02/03/2022)   Humiliation, Afraid, Rape, and Kick questionnaire    Fear of Current or Ex-Partner: No    Emotionally Abused: No    Physically Abused: No    Sexually Abused: No      Family History  Problem Relation Age of Onset   Stroke Mother    Hypertension Mother    Heart disease Father    Diabetes Brother    Stroke Maternal Grandmother    Stroke Maternal Grandfather    Heart disease Paternal Grandmother    Heart disease Paternal Grandfather    Breast cancer Neg Hx     Vitals:   09/25/22 0939  BP: 128/70  Pulse: (!) 55  SpO2: 95%  Weight: 172 lb 6.4 oz (78.2 kg)   Wt Readings from Last 3 Encounters:  09/25/22 172 lb 6.4 oz (78.2 kg)  08/25/22 174 lb 9.6 oz (79.2 kg)  08/07/22 179 lb 3.2 oz (81.3 kg)     PHYSICAL EXAM: General:  Well appearing. No resp difficulty HEENT: normal Neck: supple. no JVD. Carotids 2+ bilat; no bruits. No lymphadenopathy or thryomegaly appreciated. Cor: PMI nondisplaced. Regular rate & rhythm. No rubs, gallops or murmurs. Lungs: mildly decreased Abdomen: soft, nontender, nondistended. No hepatosplenomegaly. No bruits or masses. Good bowel sounds. Extremities: no cyanosis, clubbing, rash, edema Neuro: alert & orientedx3, cranial nerves grossly intact. moves all 4 extremities w/o difficulty. Affect pleasant   ASSESSMENT & PLAN:   1. Pulmonary HTN, mild to moderate - Echo 03/2021 ([facility]) EF>55% , moderate TR, severe pulmonary hypertension RVSP . - Echo 07/2021 EF 60- 65%, severe pulmonary hypertension, RVSP 72.4 mmHg RV mildly reduced - Echo 1/24 EF 60-65% TR inadequate to assess RVSP - but no apical windows available - Right heart cath 09/2021  RA 5  PA 56/24 (35)  PCWP 18 PVR 2.6 WU Fick 6.6/3.5 - CT chest: 6/23: No PE + mild emphysema - CT chest 1/24 multiple tiny filling defects consistent with  pulmonary emboli. 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 Doubt significant CTEPH but will need VQ down the road given recent PEs - Auto-immune serologies negative - Continue compliance with home O2 and CPAP to keep sats > 90% at all times - Watch volume status (see below) - Will repeat echo at next visit in 6 months  2. Chronic diastolic HF - Echo 1/24: EF 60-65% RV mildly reduced . No TR to estimate RVSP. - Continue Jardiance - Fluid looks great  3. Chronic hypoxic respiratory failure on home O2 - PFTs as above - Continue to be compliant with home O2 and CPAP to keep sats > 90% at all times - Previously followed with Dr. Judeth Horn but hard to commute to [facility] so we referred locally to Pulmonary but apparently they only assessed her OSA  4. CAD - per Dr. Azucena Cecil - No s/s angina  5. Obesity - Body mass index is 27.83 kg/m. - has lost 20 pounds with diet modification  6. Pulmonary embolism - continue Eliquis   7. OSA - on CPAP. Download looked good.   Arvilla Meres, MD  10:19 AM

## 2022-09-25 NOTE — Addendum Note (Signed)
Addended by: Simonne Maffucci on: 09/25/2022 10:57 AM   Modules accepted: Orders

## 2022-09-25 NOTE — Patient Instructions (Signed)
Your doctor wants you to have an echocardiogram in 6 months. We will call you to schedule. Your physician has requested that you have an echocardiogram. Echocardiography is a painless test that uses sound waves to create images of your heart. It provides your doctor with information about the size and shape of your heart and how well your heart's chambers and valves are working. This procedure takes approximately one hour. There are no restrictions for this procedure. Please do NOT wear cologne, perfume, aftershave, or lotions (deodorant is allowed). Please arrive 15 minutes prior to your appointment time.   Follow up: 6 months with Dr. Gala Romney. Please call the clinic in December to schedule this appointment.

## 2022-09-26 ENCOUNTER — Telehealth: Payer: Self-pay | Admitting: Internal Medicine

## 2022-09-26 NOTE — Telephone Encounter (Signed)
No number provided for [facility].  Rx signed by Dr. Belia Heman and faxed to [facility]. Patient is aware and voiced her understanding.  Nothing further needed.

## 2022-09-26 NOTE — Telephone Encounter (Signed)
[facility] called and PT is ordering a [place] O2 from them. They fax'd a form to bve signed by Dr. Belia Heman to [facility] this. Sent to [employer] office. TY.

## 2022-10-09 ENCOUNTER — Ambulatory Visit (INDEPENDENT_AMBULATORY_CARE_PROVIDER_SITE_OTHER): Payer: 59 | Admitting: Pediatrics

## 2022-10-09 ENCOUNTER — Ambulatory Visit: Payer: Self-pay

## 2022-10-09 ENCOUNTER — Encounter: Payer: Self-pay | Admitting: Pediatrics

## 2022-10-09 VITALS — BP 104/65 | HR 65 | Temp 98.2°F | Wt 165.0 lb

## 2022-10-09 DIAGNOSIS — R252 Cramp and spasm: Secondary | ICD-10-CM | POA: Diagnosis not present

## 2022-10-09 DIAGNOSIS — Z1231 Encounter for screening mammogram for malignant neoplasm of breast: Secondary | ICD-10-CM

## 2022-10-09 DIAGNOSIS — H539 Unspecified visual disturbance: Secondary | ICD-10-CM

## 2022-10-09 DIAGNOSIS — Z122 Encounter for screening for malignant neoplasm of respiratory organs: Secondary | ICD-10-CM | POA: Diagnosis not present

## 2022-10-09 DIAGNOSIS — Z789 Other specified health status: Secondary | ICD-10-CM

## 2022-10-09 NOTE — Progress Notes (Signed)
BP 104/65   Pulse 65   Temp 98.2 F (36.8 C) (Oral)   Wt 165 lb (74.8 kg)   LMP  (LMP Unknown)   SpO2 98%   BMI 26.63 kg/m    Subjective:    Patient ID: Brandi Steele, female    DOB: 1955/06/01, 67 y.o.   MRN: [id number]  HPI: Brandi Steele is a 67 y.o. female  Chief Complaint  Patient presents with   Muscle Pain    Patient states she has been experiencing muscle cramps for the last few weeks. States she has mainly been getting them in her legs, occasionally in her feet and hands. States the cramps are becoming more frequent than usual.    #muscle cramping  Chronic issue that has been worsening more over the last few weeks She is able to stretch it out when it happens Has been having night cramping  Happens throughout the day, mostly at night Happens mostly in lower extremities but also occasionally upper No sensory changes No back pain No new medications Has not tried anything Occasionally takes lasix if she gains a couple of pounds, rare use last took in January Switched manesium glyco nate 420mg  per day She stays on top of vitamins Has switched to vegetarian diet   #vision changes Reports she sees "geometric shapes" similar to the halos people describe as halos before a migraine She does not get headaches Unsure how long it has been going on She does not regularly see and eye doctor  Unfortunately her husband just passed away over the weekend. She is grieving.   Relevant past medical, surgical, family and social history reviewed and updated as indicated. Interim medical history since our last visit reviewed. Allergies and medications reviewed and updated.  ROS per HPI unless specifically indicated above     Objective:    BP 104/65   Pulse 65   Temp 98.2 F (36.8 C) (Oral)   Wt 165 lb (74.8 kg)   LMP  (LMP Unknown)   SpO2 98%   BMI 26.63 kg/m   Wt Readings from Last 3 Encounters:  10/09/22 165 lb (74.8 kg)  09/25/22 172 lb 6.4 oz (78.2 kg)   08/25/22 174 lb 9.6 oz (79.2 kg)    Physical Exam: GEN: alert, cooperative, and in NAD HENT: atraumatic, normocephalic EYES: anicteric sclera  CV: hemodynamically stable  RESP: breathing comfortably on room air  GI/ABD: soft, non-tender, non-distended  EXT: warm and well perfused, no lower extremity edema, neurovascularly intact, normal range of motion noted on knee, ankle, elbow and shoulder joints PSYCH: normal behavior and appropriate affect  Assessment & Plan:  Dedria was seen today for muscle pain.  Diagnoses and all orders for this visit:  Muscle cramp Vegetarian diet New worsening symptom. Exam reassuring today. No obvious culprit from medication list. Emphasized hydration and stretching. Will r/o electrolytes abnormalities and deficiencies below. Unclear if related to relatively new vegetarian diet. -     Iron -     Ferritin -     Iron and TIBC -     Magnesium -     Vitamin B12 -     Folate -     CBC with Differential/Platelet  Vision changes Unclear etiology but would benefit from comprehensive exam. -     Ambulatory referral to Ophthalmology  Screening for lung cancer Due for screen -     Ambulatory Referral for Lung Cancer Screen  Screen for breast cancer Mammo ordered placed.  Follow up plan: Return in about 4 weeks (around 11/06/2022) for medicare annual visit .  Brandi Ruffini Howell Pringle, MD

## 2022-10-09 NOTE — Telephone Encounter (Signed)
  Chief Complaint: muscle cramping Symptoms: legs, feet, hands cramping, wakes her up at night Frequency: off and on for years Pertinent Negatives: Patient denies swelling or redness , SON or CP Disposition: [] ED /[] Urgent Care (no appt availability in office) / [x] Appointment(In office/virtual)/ []  [facility]/ [] Home Care/ [] Refused Recommended Disposition /[] [facility]/ []  Follow-up with PCP Additional Notes: in office today Reason for Disposition  [1] SEVERE pain (e.g., excruciating, unable to do any normal activities) AND [2] not improved after 2 hours of pain medicine  Answer Assessment - Initial Assessment Questions 1. ONSET: "When did the pain start?"      Of and on for years - 2. LOCATION: "Where is the pain located?"      Varies- calves and feet- back of legs wakes her from sleep, foot cramping, hand cramping   3. PAIN: "How bad is the pain?"    (Scale 1-10; or mild, moderate, severe)   -  MILD (1-3): doesn't interfere with normal activities    -  MODERATE (4-7): interferes with normal activities (e.g., work or school) or awakens from sleep, limping    -  SEVERE (8-10): excruciating pain, unable to do any normal activities, unable to walk     severe 4. WORK OR EXERCISE: "Has there been any recent work or exercise that involved this part of the body?"      no 5. CAUSE: "What do you think is causing the leg pain?"     ? Tea- pulmonary HTN , dehydrating  6. OTHER SYMPTOMS: "Do you have any other symptoms?" (e.g., chest pain, back pain, breathing difficulty, swelling, rash, fever, numbness, weakness)     no 7. PREGNANCY: "Is there any chance you are pregnant?" "When was your last menstrual period?"     N/a  Protocols used: Leg Pain-A-AH

## 2022-10-10 LAB — CBC WITH DIFFERENTIAL/PLATELET
Basophils Absolute: 0.1 10*3/uL (ref 0.0–0.2)
Basos: 1 %
EOS (ABSOLUTE): 0.1 10*3/uL (ref 0.0–0.4)
Eos: 2 %
Hematocrit: 46.9 % — ABNORMAL HIGH (ref 34.0–46.6)
Hemoglobin: 14.7 g/dL (ref 11.1–15.9)
Immature Grans (Abs): 0 10*3/uL (ref 0.0–0.1)
Immature Granulocytes: 0 %
Lymphocytes Absolute: 1 10*3/uL (ref 0.7–3.1)
Lymphs: 16 %
MCH: 28.9 pg (ref 26.6–33.0)
MCHC: 31.3 g/dL — ABNORMAL LOW (ref 31.5–35.7)
MCV: 92 fL (ref 79–97)
Monocytes Absolute: 0.5 10*3/uL (ref 0.1–0.9)
Monocytes: 7 %
Neutrophils Absolute: 4.5 10*3/uL (ref 1.4–7.0)
Neutrophils: 74 %
Platelets: 182 10*3/uL (ref 150–450)
RBC: 5.09 x10E6/uL (ref 3.77–5.28)
RDW: 13.6 % (ref 11.7–15.4)
WBC: 6.2 10*3/uL (ref 3.4–10.8)

## 2022-10-10 LAB — IRON AND TIBC
Iron Saturation: 13 % — ABNORMAL LOW (ref 15–55)
Iron: 36 ug/dL (ref 27–139)
Total Iron Binding Capacity: 283 ug/dL (ref 250–450)
UIBC: 247 ug/dL (ref 118–369)

## 2022-10-10 LAB — FOLATE: Folate: 2.1 ng/mL — ABNORMAL LOW (ref >3.0)

## 2022-10-10 LAB — VITAMIN B12: Vitamin B-12: 312 pg/mL (ref 232–1245)

## 2022-10-10 LAB — MAGNESIUM: Magnesium: 2.2 mg/dL (ref 1.6–2.3)

## 2022-10-10 LAB — FERRITIN: Ferritin: 164 ng/mL — ABNORMAL HIGH (ref 15–150)

## 2022-10-28 ENCOUNTER — Ambulatory Visit: Payer: Medicare HMO | Admitting: Emergency Medicine

## 2022-10-28 VITALS — BP 126/74 | HR 55 | Ht 66.0 in | Wt 165.6 lb

## 2022-10-28 DIAGNOSIS — Z0000 Encounter for general adult medical examination without abnormal findings: Secondary | ICD-10-CM | POA: Diagnosis not present

## 2022-10-28 DIAGNOSIS — Z23 Encounter for immunization: Secondary | ICD-10-CM

## 2022-10-28 DIAGNOSIS — G4733 Obstructive sleep apnea (adult) (pediatric): Secondary | ICD-10-CM | POA: Diagnosis not present

## 2022-10-28 NOTE — Patient Instructions (Addendum)
Brandi Steele , Thank you for taking time to come for your Medicare Wellness Visit. I appreciate your ongoing commitment to your health goals. Please review the following plan we discussed and let me know if I can assist you in the future.   Referrals/Orders/Follow-Ups/Clinician Recommendations: Call to schedule low dose CT Scan @ [phone]. Call [facility] to schedule your mammogram @ [phone].  This is a list of the screening recommended for you and due dates:  Health Maintenance  Topic Date Due   COVID-19 Vaccine (1) Never done   Zoster (Shingles) Vaccine (1 of 2) Never done   Colon Cancer Screening  Never done   Pneumonia Vaccine (3 of 3 - PPSV23 or PCV20) 03/12/2023*   Flu Shot  04/27/2023*   Mammogram  11/02/2022   Screening for Lung Cancer  02/07/2023   Medicare Annual Wellness Visit  10/28/2023   DTaP/Tdap/Td vaccine (3 - Td or Tdap) 05/27/2025   DEXA scan (bone density measurement)  11/01/2025   Hepatitis C Screening  Completed   HPV Vaccine  Aged Out  *Topic was postponed. The date shown is not the original due date.    Advanced directives: (ACP Link)Information on Advanced Care Planning can be found at [facility] Directives Advance Health Care Directives ([email or web address]   Next Medicare Annual Wellness Visit scheduled for next year: Yes, 11/03/23 @ 11:10am

## 2022-10-28 NOTE — Progress Notes (Signed)
Subjective:   Brandi Steele is a 67 y.o. female who presents for Medicare Annual (Subsequent) preventive examination.  Visit Complete: In person   Cardiac Risk Factors include: advanced age (>71men, >33 women);hypertension;dyslipidemia;Other (see comment), Risk factor comments: CAD, OSA (cpap), CHF     Objective:    Today's Vitals   10/28/22 1024  BP: 126/74  Pulse: (!) 55  SpO2: 93%  Weight: 165 lb 9.6 oz (75.1 kg)  Height: 5\' 6"  (1.676 m)   Body mass index is 26.73 kg/m.     10/28/2022   10:48 AM 02/03/2022    5:00 AM 02/02/2022    1:46 PM 10/21/2021    8:35 AM 06/27/2021   12:05 PM 10/19/2020    8:33 AM 10/17/2019    9:09 AM  Advanced Directives  Does Patient Have a Medical Advance Directive? No No No No No No No  Would patient like information on creating a medical advance directive? Yes (MAU/Ambulatory/Procedural Areas - Information given) No - Patient declined  No - Patient declined No - Patient declined      Current Medications (verified) Outpatient Encounter Medications as of 10/28/2022  Medication Sig   albuterol (VENTOLIN HFA) 108 (90 Base) MCG/ACT inhaler INHALE 2 PUFFS INTO THE LUNGS EVERY 6 HOURS AS NEEDED FOR WHEEZING OR SHORTNESS OF BREATH   ALPRAZolam (XANAX) 0.5 MG tablet Take 1 tablet (0.5 mg total) by mouth 3 (three) times daily as needed for anxiety.   budesonide-formoterol (SYMBICORT) 160-4.5 MCG/ACT inhaler Inhale 2 puffs into the lungs 2 (two) times daily.   Calcium Carbonate-Vit D-Min (CALCIUM 1200 PO) Take by mouth daily.   cetirizine (ZYRTEC) 10 MG tablet Take 1 tablet (10 mg total) by mouth daily. (Patient taking differently: Take 10 mg by mouth as needed.)   clopidogrel (PLAVIX) 75 MG tablet TAKE 1 TABLET(75 MG) BY MOUTH DAILY   Coenzyme Q10 (CO Q 10 PO) Take 2 capsules by mouth every morning.   empagliflozin (JARDIANCE) 10 MG TABS tablet TAKE 1 TABLET(10 MG) BY MOUTH DAILY BEFORE BREAKFAST   esomeprazole (NEXIUM) 40 MG capsule TAKE ONE CAPSULE BY  MOUTH TWICE DAILY (Patient taking differently: 40 mg daily. TAKE ONE CAPSULE BY MOUTH TWICE DAILY)   Evolocumab (REPATHA SURECLICK) 140 MG/ML SOAJ Inject 140 mg into the skin every 14 (fourteen) days.   Folic Acid (FOLATE PO) Take 1 tablet by mouth daily.   hydrOXYzine (ATARAX) 25 MG tablet TAKE 1 TABLET(25 MG) BY MOUTH THREE TIMES DAILY AS NEEDED (Patient taking differently: 1 tablet at night when can't sleep)   lisinopril (ZESTRIL) 10 MG tablet Take 1 tablet (10 mg total) by mouth daily. Take 1 tab daily   Magnesium 400 MG CAPS Take 550 mg by mouth. 2 in the AM and 2 in the PM   meloxicam (MOBIC) 15 MG tablet TAKE 1 TABLET(15 MG) BY MOUTH DAILY   Potassium Gluconate 595 MG CAPS Take 400 mg by mouth in the morning, at noon, in the evening, and at bedtime. Take 4 capsules daily   triamcinolone cream (KENALOG) 0.1 % Apply 1 application. topically 2 (two) times daily.   Chlorpheniramine-DM (DIMETAPP LONG ACT COUGH/COLD) 1-7.5 MG/5ML SYRP Take 10 mLs by mouth every 6 (six) hours as needed. (Patient not taking: Reported on 10/28/2022)   furosemide (LASIX) 40 MG tablet TAKE 1 TABLET(40 MG) BY MOUTH DAILY AS NEEDED (Patient not taking: Reported on 10/28/2022)   Facility-Administered Encounter Medications as of 10/28/2022  Medication   albuterol (PROVENTIL) (2.5 MG/3ML) 0.083% nebulizer  solution 2.5 mg    Allergies (verified) Morphine and codeine, Celexa [citalopram hydrobromide], Effexor [venlafaxine], Lipitor [atorvastatin], Shellfish allergy, and Wellbutrin [bupropion]   History: Past Medical History:  Diagnosis Date   Anxiety    Aortic stenosis    CHF (congestive heart failure) (HCC)    COPD (chronic obstructive pulmonary disease) (HCC)    Depression    Emphysema of lung (HCC)    Fibromyalgia    GERD (gastroesophageal reflux disease)    Hyperlipidemia    Hypertension    Hypertensive heart disease    Myalgia    Obesity    Sciatica    Seborrheic keratosis    Stress incontinence     Past Surgical History:  Procedure Laterality Date   RIGHT HEART CATH N/A 10/07/2021   Procedure: RIGHT HEART CATH;  Surgeon: Iran Ouch, MD;  Location: [facility] INVASIVE CV LAB;  Service: Cardiovascular;  Laterality: N/A;   TONSILLECTOMY     TOTAL ABDOMINAL HYSTERECTOMY     partial   Family History  Problem Relation Age of Onset   Stroke Mother    Hypertension Mother    Heart disease Father    Diabetes Brother    Stroke Maternal Grandmother    Stroke Maternal Grandfather    Heart disease Paternal Grandmother    Heart disease Paternal Grandfather    Breast cancer Neg Hx    Social History   Socioeconomic History   Marital status: Widowed    Spouse name: Not on file   Number of children: 3   Years of education: Not on file   Highest education level: Not on file  Occupational History   Occupation: retired  Tobacco Use   Smoking status: Former    Current packs/day: 0.00    Average packs/day: 1 pack/day for 40.0 years (40.0 ttl pk-yrs)    Types: Cigarettes    Start date: 11/01/1976    Quit date: 11/01/2016    Years since quitting: 5.9   Smokeless tobacco: Never   Tobacco comments:    Is trying cold-turkey with social support  Vaping Use   Vaping status: Former   Start date: 12/02/2016   Quit date: 09/06/2021  Substance and Sexual Activity   Alcohol use: No    Alcohol/week: 0.0 standard drinks of alcohol   Drug use: No   Sexual activity: Not Currently  Other Topics Concern   Not on file  Social History Narrative   Husband passed away 07-Oct-2022, 3 children, 1 passed from drug use   Social Determinants of Health   Financial Resource Strain: Low Risk  (10/28/2022)   Overall Financial Resource Strain (CARDIA)    Difficulty of Paying Living Expenses: Not hard at all  Food Insecurity: No Food Insecurity (10/28/2022)   Hunger Vital Sign    Worried About Running Out of Food in the Last Year: Never true    Ran Out of Food in the Last Year: Never true  Transportation  Needs: No Transportation Needs (10/28/2022)   PRAPARE - Administrator, Civil Service (Medical): No    Lack of Transportation (Non-Medical): No  Physical Activity: Insufficiently Active (10/28/2022)   Exercise Vital Sign    Days of Exercise per Week: 5 days    Minutes of Exercise per Session: 10 min  Stress: No Stress Concern Present (10/28/2022)   [employer] of Occupational Health - Occupational Stress Questionnaire    Feeling of Stress : Only a little  Social Connections: Moderately Isolated (10/28/2022)   Social  Connection and Isolation Panel [NHANES]    Frequency of Communication with Friends and Family: More than three times a week    Frequency of Social Gatherings with Friends and Family: More than three times a week    Attends Religious Services: More than 4 times per year    Active Member of [employer] or Organizations: No    Attends Banker Meetings: Never    Marital Status: Widowed    Tobacco Counseling Counseling given: No Tobacco comments: Is trying cold-turkey with social support   Clinical Intake:  Pre-visit preparation completed: Yes  Pain : No/denies pain     BMI - recorded: 26.73 Nutritional Status: BMI 25 -29 Overweight Nutritional Risks: None Diabetes: No  How often do you need to have someone help you when you read instructions, pamphlets, or other written materials from your doctor or pharmacy?: 1 - Never  Interpreter Needed?: No  Information entered by :: Tora Kindred, CMA   Activities of Daily Living    10/28/2022   10:29 AM 02/03/2022    5:00 AM  In your present state of health, do you have any difficulty performing the following activities:  Hearing? 0 0  Vision? 0 0  Difficulty concentrating or making decisions? 0 0  Walking or climbing stairs? 1 0  Comment sob, uses 2 liters of oxygen   Dressing or bathing? 0 0  Doing errands, shopping? 0 0  Preparing Food and eating ? N   Using the Toilet? N   In the past six  months, have you accidently leaked urine? N   Do you have problems with loss of bowel control? N   Managing your Medications? N   Managing your Finances? N   Housekeeping or managing your Housekeeping? N     Patient Care Team: Jackolyn Confer, MD as PCP - General (Family Medicine) Debbe Odea, MD as PCP - Cardiology (Cardiology) Lamar Blinks, MD as Consulting Physician (Cardiology)  Indicate any recent Medical Services you may have received from other than [facility] providers in the past year (date may be approximate).     Assessment:   This is a routine wellness examination for Benedetta.  Hearing/Vision screen Hearing Screening - Comments:: Denies hearing loss Vision Screening - Comments:: Does not get eye exams   Goals Addressed               This Visit's Progress     Patient Stated (pt-stated)        Continue to eat healthy, exercise and lose weight      Depression Screen    10/28/2022   10:44 AM 10/09/2022    1:15 PM 08/25/2022    9:05 AM 08/05/2022    8:54 AM 07/28/2022   11:22 AM 06/27/2022   10:14 AM 05/27/2022    8:42 AM  PHQ 2/9 Scores  PHQ - 2 Score 0 0 0 0 0 0 0  PHQ- 9 Score 1 0 0 0 0 0 0    Fall Risk    10/28/2022   10:49 AM 10/09/2022    1:14 PM 08/25/2022    9:06 AM 08/05/2022    8:54 AM 07/28/2022   11:21 AM  Fall Risk   Falls in the past year? 0 0 0 0 0  Number falls in past yr: 0 0 0 0 0  Injury with Fall? 0 0 0 0 0  Risk for fall due to : No Fall Risks No Fall Risks No Fall Risks No  Fall Risks No Fall Risks  Follow up Falls prevention discussed Falls evaluation completed Falls evaluation completed Falls evaluation completed Falls evaluation completed    MEDICARE RISK AT HOME: Medicare Risk at Home Any stairs in or around the home?: No If so, are there any without handrails?: No Home free of loose throw rugs in walkways, pet beds, electrical cords, etc?: No (oxygen tubing) Adequate lighting in your home to reduce risk of falls?: Yes Life  alert?: No Use of a cane, walker or w/c?: No Grab bars in the bathroom?: Yes Shower chair or bench in shower?: Yes Elevated toilet seat or a handicapped toilet?: Yes  TIMED UP AND GO:  Was the test performed?  Yes  Length of time to ambulate 10 feet: 10 sec Gait steady and fast without use of assistive device    Cognitive Function:        10/28/2022   10:52 AM 10/21/2021    8:36 AM 10/17/2019    9:13 AM  6CIT Screen  What Year? 0 points 0 points 0 points  What month? 0 points 0 points 0 points  What time? 0 points 0 points 0 points  Count back from 20 0 points 0 points 0 points  Months in reverse 0 points 0 points 0 points  Repeat phrase 0 points 0 points 0 points  Total Score 0 points 0 points 0 points    Immunizations Immunization History  Administered Date(s) Administered   Fluad Quad(high Dose 65+) 11/17/2018   Influenza,inj,Quad PF,6+ Mos 10/09/2015, 12/26/2016   Influenza-Unspecified 11/22/2017   Pneumococcal Conjugate-13 03/03/2019   Pneumococcal Polysaccharide-23 03/28/2009   Td 06/14/2004   Tdap 05/28/2015    TDAP status: Up to date  Flu Vaccine status: Declined, Education has been provided regarding the importance of this vaccine but patient still declined. Advised may receive this vaccine at local pharmacy or Health Dept. Aware to provide a copy of the vaccination record if obtained from local pharmacy or Health Dept. Verbalized acceptance and understanding.  Pneumococcal vaccine status: Completed during today's visit.  Covid-19 vaccine status: Declined, Education has been provided regarding the importance of this vaccine but patient still declined. Advised may receive this vaccine at local pharmacy or Health Dept.or vaccine clinic. Aware to provide a copy of the vaccination record if obtained from local pharmacy or Health Dept. Verbalized acceptance and understanding.  Qualifies for Shingles Vaccine? Yes   Zostavax completed No   Shingrix Completed?: No.     Education has been provided regarding the importance of this vaccine. Patient has been advised to call insurance company to determine out of pocket expense if they have not yet received this vaccine. Advised may also receive vaccine at local pharmacy or Health Dept. Verbalized acceptance and understanding.  Screening Tests Health Maintenance  Topic Date Due   COVID-19 Vaccine (1) Never done   Zoster Vaccines- Shingrix (1 of 2) Never done   Colonoscopy  Never done   Pneumonia Vaccine 45+ Years old (3 of 3 - PPSV23 or PCV20) 03/12/2023 (Originally 03/02/2020)   INFLUENZA VACCINE  04/27/2023 (Originally 08/28/2022)   MAMMOGRAM  11/02/2022   Lung Cancer Screening  02/07/2023   Medicare Annual Wellness (AWV)  10/28/2023   DTaP/Tdap/Td (3 - Td or Tdap) 05/27/2025   DEXA SCAN  11/01/2025   Hepatitis C Screening  Completed   HPV VACCINES  Aged Out    Health Maintenance  Health Maintenance Due  Topic Date Due   COVID-19 Vaccine (1) Never done  Zoster Vaccines- Shingrix (1 of 2) Never done   Colonoscopy  Never done    Colon Cancer Screening: has never had done, due to CHF and having to limit her fluid intake.  Mammogram status: Ordered 10/09/22. Pt provided with contact info and advised to call to schedule appt.   Bone Density status: Completed 11/01/20. Results reflect: Bone density results: OSTEOPENIA. Repeat every 5 years.  Lung Cancer Screening: (Low Dose CT Chest recommended if Age 49-80 years, 20 pack-year currently smoking OR have quit w/in 15years.) does qualify.   Lung Cancer Screening Referral: order placed 10/09/22  Additional Screening:  Hepatitis C Screening: does not qualify; Completed 05/28/15  Vision Screening: Recommended annual ophthalmology exams for early detection of glaucoma and other disorders of the eye.  Dental Screening: Recommended annual dental exams for proper oral hygiene    Community Resource Referral / Chronic Care Management: CRR required this visit?   No   CCM required this visit?  No     Plan:     I have personally reviewed and noted the following in the patient's chart:   Medical and social history Use of alcohol, tobacco or illicit drugs  Current medications and supplements including opioid prescriptions. Patient is not currently taking opioid prescriptions. Functional ability and status Nutritional status Physical activity Advanced directives List of other physicians Hospitalizations, surgeries, and ER visits in previous 12 months Vitals Screenings to include cognitive, depression, and falls Referrals and appointments  In addition, I have reviewed and discussed with patient certain preventive protocols, quality metrics, and best practice recommendations. A written personalized care plan for preventive services as well as general preventive health recommendations were provided to patient.     Tora Kindred, CMA   10/28/2022   After Visit Summary: (In Person-Printed) AVS printed and given to the patient  Nurse Notes:  Supplied phone # to schedule low dose lung CT scan and MMG. Administered Prevnar 20

## 2022-10-31 ENCOUNTER — Other Ambulatory Visit: Payer: Self-pay | Admitting: Nurse Practitioner

## 2022-10-31 NOTE — Telephone Encounter (Signed)
Requested Prescriptions  Pending Prescriptions Disp Refills   furosemide (LASIX) 40 MG tablet [Pharmacy Med Name: FUROSEMIDE 40MG  TABLETS] 30 tablet 2    Sig: TAKE 1 TABLET(40 MG) BY MOUTH DAILY AS NEEDED     Cardiovascular:  Diuretics - Loop Passed - 10/31/2022  7:04 AM      Passed - K in normal range and within 180 days    Potassium  Date Value Ref Range Status  08/25/2022 4.2 3.5 - 5.2 mmol/L Final  06/16/2013 4.0 3.5 - 5.1 mmol/L Final         Passed - Ca in normal range and within 180 days    Calcium  Date Value Ref Range Status  08/25/2022 9.7 8.7 - 10.3 mg/dL Final   Calcium, Total  Date Value Ref Range Status  06/16/2013 9.0 8.5 - 10.1 mg/dL Final         Passed - Na in normal range and within 180 days    Sodium  Date Value Ref Range Status  08/25/2022 143 134 - 144 mmol/L Final  06/16/2013 143 136 - 145 mmol/L Final         Passed - Cr in normal range and within 180 days    Creatinine  Date Value Ref Range Status  06/16/2013 0.63 0.60 - 1.30 mg/dL Final   Creatinine, Ser  Date Value Ref Range Status  08/25/2022 0.73 0.57 - 1.00 mg/dL Final         Passed - Cl in normal range and within 180 days    Chloride  Date Value Ref Range Status  08/25/2022 104 96 - 106 mmol/L Final  06/16/2013 103 98 - 107 mmol/L Final         Passed - Mg Level in normal range and within 180 days    Magnesium  Date Value Ref Range Status  10/09/2022 2.2 1.6 - 2.3 mg/dL Final  57/84/6962 1.4 (L) mg/dL Final    Comment:    9.5-2.8 THERAPEUTIC RANGE: 4-7 mg/dL TOXIC: > 10 mg/dL  -----------------------          Passed - Last BP in normal range    BP Readings from Last 1 Encounters:  10/28/22 126/74         Passed - Valid encounter within last 6 months    Recent Outpatient Visits           3 weeks ago Muscle cramp   [place] [facility] Jackolyn Confer, MD   2 months ago Chronic congestive heart failure, unspecified heart failure type (HCC)   [facility] Mecum, Oswaldo Conroy, PA-C   2 months ago Productive cough   [place] [address] [facility] Mecum, Oswaldo Conroy, PA-C   3 months ago Essential hypertension   [facility] Larae Grooms, NP   4 months ago Essential hypertension   [facility] [facility] Larae Grooms, NP       Future Appointments             In 6 days Evelene Croon, Atilano Median, MD [place] [facility]   In 3 weeks Larae Grooms, NP [place] [facility]

## 2022-11-01 DIAGNOSIS — G4733 Obstructive sleep apnea (adult) (pediatric): Secondary | ICD-10-CM | POA: Diagnosis not present

## 2022-11-06 ENCOUNTER — Ambulatory Visit (INDEPENDENT_AMBULATORY_CARE_PROVIDER_SITE_OTHER): Payer: Medicare HMO | Admitting: Pediatrics

## 2022-11-06 ENCOUNTER — Encounter: Payer: Self-pay | Admitting: Pediatrics

## 2022-11-06 VITALS — BP 137/72 | HR 55 | Temp 97.9°F | Ht 65.0 in | Wt 163.4 lb

## 2022-11-06 DIAGNOSIS — Z13220 Encounter for screening for lipoid disorders: Secondary | ICD-10-CM | POA: Diagnosis not present

## 2022-11-06 DIAGNOSIS — Z1211 Encounter for screening for malignant neoplasm of colon: Secondary | ICD-10-CM

## 2022-11-06 DIAGNOSIS — Z1330 Encounter for screening examination for mental health and behavioral disorders, unspecified: Secondary | ICD-10-CM

## 2022-11-06 DIAGNOSIS — Z0000 Encounter for general adult medical examination without abnormal findings: Secondary | ICD-10-CM | POA: Diagnosis not present

## 2022-11-06 DIAGNOSIS — R252 Cramp and spasm: Secondary | ICD-10-CM

## 2022-11-06 DIAGNOSIS — Z131 Encounter for screening for diabetes mellitus: Secondary | ICD-10-CM

## 2022-11-06 NOTE — Patient Instructions (Addendum)
Due for mammogram.   For constipation: - use miralax to soften stools - senna to help move this along  You can use one or both together.

## 2022-11-06 NOTE — Progress Notes (Deleted)
Annual Wellness Visit     Patient: Brandi Steele, Female    DOB: [birth year], 67 y.o.   MRN: [id number]  Subjective  No chief complaint on file.   Brandi Steele is a 67 y.o. female who presents today for her Annual Wellness Visit. She reports consuming a {diet types:17450} diet. {Exercise:19826} She generally feels {well/fairly well/poorly:18703}. She reports sleeping {well/fairly well/poorly:18703}. She {does/does not:200015} have additional problems to discuss today.   HPI  {VISON DENTAL STD PSA (Optional):27386}   {History (Optional):23778}  Medications: Outpatient Medications Prior to Visit  Medication Sig   albuterol (VENTOLIN HFA) 108 (90 Base) MCG/ACT inhaler INHALE 2 PUFFS INTO THE LUNGS EVERY 6 HOURS AS NEEDED FOR WHEEZING OR SHORTNESS OF BREATH   ALPRAZolam (XANAX) 0.5 MG tablet Take 1 tablet (0.5 mg total) by mouth 3 (three) times daily as needed for anxiety.   budesonide-formoterol (SYMBICORT) 160-4.5 MCG/ACT inhaler Inhale 2 puffs into the lungs 2 (two) times daily.   Calcium Carbonate-Vit D-Min (CALCIUM 1200 PO) Take by mouth daily.   cetirizine (ZYRTEC) 10 MG tablet Take 1 tablet (10 mg total) by mouth daily. (Patient taking differently: Take 10 mg by mouth as needed.)   Chlorpheniramine-DM (DIMETAPP LONG ACT COUGH/COLD) 1-7.5 MG/5ML SYRP Take 10 mLs by mouth every 6 (six) hours as needed.   clopidogrel (PLAVIX) 75 MG tablet TAKE 1 TABLET(75 MG) BY MOUTH DAILY   Coenzyme Q10 (CO Q 10 PO) Take 2 capsules by mouth every morning.   empagliflozin (JARDIANCE) 10 MG TABS tablet TAKE 1 TABLET(10 MG) BY MOUTH DAILY BEFORE BREAKFAST   esomeprazole (NEXIUM) 40 MG capsule TAKE ONE CAPSULE BY MOUTH TWICE DAILY (Patient taking differently: 40 mg daily. TAKE ONE CAPSULE BY MOUTH TWICE DAILY)   Evolocumab (REPATHA SURECLICK) 140 MG/ML SOAJ Inject 140 mg into the skin every 14 (fourteen) days.   Folic Acid (FOLATE PO) Take 1 tablet by mouth daily.   furosemide (LASIX) 40 MG  tablet TAKE 1 TABLET(40 MG) BY MOUTH DAILY AS NEEDED   hydrOXYzine (ATARAX) 25 MG tablet TAKE 1 TABLET(25 MG) BY MOUTH THREE TIMES DAILY AS NEEDED (Patient taking differently: 1 tablet at night when can't sleep)   lisinopril (ZESTRIL) 10 MG tablet Take 1 tablet (10 mg total) by mouth daily. Take 1 tab daily   Magnesium 400 MG CAPS Take 550 mg by mouth. 2 in the AM and 2 in the PM   meloxicam (MOBIC) 15 MG tablet TAKE 1 TABLET(15 MG) BY MOUTH DAILY   Potassium Gluconate 595 MG CAPS Take 400 mg by mouth in the morning, at noon, in the evening, and at bedtime. Take 4 capsules daily   triamcinolone cream (KENALOG) 0.1 % Apply 1 application. topically 2 (two) times daily.   Facility-Administered Medications Prior to Visit  Medication Dose Route Frequency Provider   albuterol (PROVENTIL) (2.5 MG/3ML) 0.083% nebulizer solution 2.5 mg  2.5 mg Nebulization Once Bensimhon, Bevelyn Buckles, MD    Allergies  Allergen Reactions   Morphine And Codeine Nausea And Vomiting   Celexa [Citalopram Hydrobromide] Other (See Comments)    Pruritis    Effexor [Venlafaxine] Other (See Comments)    Panic attack   Lipitor [Atorvastatin] Other (See Comments)    Myalgias   Shellfish Allergy Hives and Swelling   Wellbutrin [Bupropion] Anxiety    Patient Care Team: Jackolyn Confer, MD as PCP - General (Family Medicine) Debbe Odea, MD as PCP - Cardiology (Cardiology) Erin Fulling, MD as Consulting Physician (Pulmonary Disease) Gala Romney, Reuel Boom  R, MD as Consulting Physician (Cardiology) Schnier, Latina Craver, MD (Vascular Surgery) Pa, [facility] (Optometry)  ROS      Objective  BP 137/72   Pulse (!) 55   Temp 97.9 F (36.6 C) (Oral)   Ht 5\' 5"  (1.651 m)   Wt 163 lb 6.4 oz (74.1 kg)   LMP  (LMP Unknown)   SpO2 98%   BMI 27.19 kg/m  {Vitals History (Optional):23777}  Physical Exam    Most recent functional status assessment:    10/28/2022   10:29 AM  In your present state of health, do  you have any difficulty performing the following activities:  Hearing? 0  Vision? 0  Difficulty concentrating or making decisions? 0  Walking or climbing stairs? 1  Comment sob, uses 2 liters of oxygen  Dressing or bathing? 0  Doing errands, shopping? 0  Preparing Food and eating ? N  Using the Toilet? N  In the past six months, have you accidently leaked urine? N  Do you have problems with loss of bowel control? N  Managing your Medications? N  Managing your Finances? N  Housekeeping or managing your Housekeeping? N   Most recent fall risk assessment:    11/06/2022   10:56 AM  Fall Risk   Falls in the past year? 0  Number falls in past yr: 0  Injury with Fall? 0  Risk for fall due to : No Fall Risks  Follow up Falls evaluation completed    Most recent depression screenings:    11/06/2022   10:56 AM 10/28/2022   10:44 AM  PHQ 2/9 Scores  PHQ - 2 Score 0 0  PHQ- 9 Score 0 1   Most recent cognitive screening:    10/28/2022   10:52 AM  6CIT Screen  What Year? 0 points  What month? 0 points  What time? 0 points  Count back from 20 0 points  Months in reverse 0 points  Repeat phrase 0 points  Total Score 0 points   Most recent Audit-C alcohol use screening    10/28/2022   10:43 AM  Alcohol Use Disorder Test (AUDIT)  1. How often do you have a drink containing alcohol? 0  2. How many drinks containing alcohol do you have on a typical day when you are drinking? 0  3. How often do you have six or more drinks on one occasion? 0  AUDIT-C Score 0   A score of 3 or more in women, and 4 or more in men indicates increased risk for alcohol abuse, EXCEPT if all of the points are from question 1   Vision/Hearing Screen: No results found.  {Labs (Optional):23779}  No results found for any visits on 11/06/22.    Assessment & Plan   Annual wellness visit done today including the all of the following: Reviewed patient's Family Medical History Reviewed and updated list  of patient's medical providers Assessment of cognitive impairment was done Assessed patient's functional ability Established a written schedule for health screening services Health Risk Assessent Completed and Reviewed  Exercise Activities and Dietary recommendations  Goals       Patient Stated (pt-stated)      Continue to eat healthy, exercise and lose weight        Immunization History  Administered Date(s) Administered   Fluad Quad(high Dose 65+) 11/17/2018   Influenza,inj,Quad PF,6+ Mos 10/09/2015, 12/26/2016   Influenza-Unspecified 11/22/2017   PNEUMOCOCCAL CONJUGATE-20 10/28/2022   Pneumococcal Conjugate-13 03/03/2019  Pneumococcal Polysaccharide-23 03/28/2009   Td 06/14/2004   Tdap 05/28/2015    Health Maintenance  Topic Date Due   COVID-19 Vaccine (1) Never done   Zoster Vaccines- Shingrix (1 of 2) Never done   Colonoscopy  Never done   MAMMOGRAM  11/02/2022   INFLUENZA VACCINE  04/27/2023 (Originally 08/28/2022)   Lung Cancer Screening  02/07/2023   Medicare Annual Wellness (AWV)  10/28/2023   DTaP/Tdap/Td (3 - Td or Tdap) 05/27/2025   DEXA SCAN  11/01/2025   Pneumonia Vaccine 22+ Years old  Completed   Hepatitis C Screening  Completed   HPV VACCINES  Aged Out     Discussed health benefits of physical activity, and encouraged her to engage in regular exercise appropriate for her age and condition.    Problem List Items Addressed This Visit   None   No follow-ups on file.     Brandi Fazzino Howell Pringle, MD

## 2022-11-06 NOTE — Progress Notes (Signed)
BP 137/72   Pulse (!) 55   Temp 97.9 F (36.6 C) (Oral)   Ht 5\' 5"  (1.651 m)   Wt 163 lb 6.4 oz (74.1 kg)   LMP  (LMP Unknown)   SpO2 98%   BMI 27.19 kg/m    Annual Physical Exam - Female  Subjective:   CC: Annual Exam  Brandi Steele is a 67 y.o. female patient here for a preventative health maintenance exam and has no acute complaints.  Health Habits: DIET: in general, a "healthy" diet  , mostly vegeterian EXERCISE: several times/week on average, activities include walking and exercise equipment  DENTAL EXAM: Up to Date EYE EXAM: Up to Date                       Relevant Gynecologic History LMP: No LMP recorded (lmp unknown). Patient has had a hysterectomy.  Menstrual Status: postmenopausal, Flow post-menopausal PAP History:  Result Date Procedure Results Follow-ups Next Due  04/01/2005 HM PAP SMEAR HM Pap smear: from PP      History abnormal PAP: No  Sexual activity: not sexually active Family history breast, ovarian cancer: No Domestic Violence Screen, feels safe at home: Yes  family history includes Diabetes in her brother; Heart disease in her father, paternal grandfather, and paternal grandmother; Hypertension in her mother; Stroke in her maternal grandfather, maternal grandmother, and mother.  Social History   Tobacco Use   Smoking status: Former    Current packs/day: 0.00    Average packs/day: 1 pack/day for 40.0 years (40.0 ttl pk-yrs)    Types: Cigarettes    Start date: 11/01/1976    Quit date: 11/01/2016    Years since quitting: 6.0   Smokeless tobacco: Never   Tobacco comments:    Is trying cold-turkey with social support  Vaping Use   Vaping status: Former   Start date: 12/02/2016   Quit date: 09/06/2021  Substance Use Topics   Alcohol use: No    Alcohol/week: 0.0 standard drinks of alcohol   Drug use: No   Social History   Social History Narrative   Husband passed away Oct 17, 2022, 3 children, 1 passed from drug use    Social drivers  questionnaire is reviewed and is positive for : Social Connections and Stress. Follow up: has good support system at home. Recently lost husband, waiting for confirmation of social security.  Depression Screening:     11/06/2022   10:56 AM 10/28/2022   10:44 AM 10/09/2022    1:15 PM 08/25/2022    9:05 AM 08/05/2022    8:54 AM  Depression screen PHQ 2/9  Decreased Interest 0 0 0 0 0  Down, Depressed, Hopeless 0 0 0 0 0  PHQ - 2 Score 0 0 0 0 0  Altered sleeping 0 1 0 0 0  Tired, decreased energy 0 0 0 0 0  Change in appetite 0 0 0 0 0  Feeling bad or failure about yourself  0 0 0 0 0  Trouble concentrating 0 0 0 0 0  Moving slowly or fidgety/restless 0 0 0 0 0  Suicidal thoughts 0 0 0 0 0  PHQ-9 Score 0 1 0 0 0  Difficult doing work/chores Not difficult at all Not difficult at all Not difficult at all Not difficult at all Not difficult at all       11/06/2022   10:57 AM 08/25/2022    9:05 AM 08/05/2022    8:54 AM 07/28/2022  11:22 AM  GAD 7 : Generalized Anxiety Score  Nervous, Anxious, on Edge 0 0 0 0  Control/stop worrying 0 0 0 0  Worry too much - different things 0 0 0 0  Trouble relaxing 0 0 0 0  Restless 0 0 0 0  Easily annoyed or irritable 0 0 0 0  Afraid - awful might happen 0 0 0 0  Total GAD 7 Score 0 0 0 0  Anxiety Difficulty Not difficult at all Not difficult at all Not difficult at all Not difficult at all   Mental Health Plan:  continue to monitor. Has good coping skills, leans on religion.  Self Management Goals  Goals       Patient Stated (pt-stated)      Continue to eat healthy, exercise and lose weight       Health Maintenance Colon Cancer Screening : Due Mammogram : Due DXA scan : Up to Date Immunizations : up to date and documented  Review of Systems See HPI for relevant ROS.  Outpatient Medications Prior to Visit  Medication Sig Dispense Refill   albuterol (VENTOLIN HFA) 108 (90 Base) MCG/ACT inhaler INHALE 2 PUFFS INTO THE LUNGS EVERY 6  HOURS AS NEEDED FOR WHEEZING OR SHORTNESS OF BREATH 18 g 1   ALPRAZolam (XANAX) 0.5 MG tablet Take 1 tablet (0.5 mg total) by mouth 3 (three) times daily as needed for anxiety. 30 tablet 0   budesonide-formoterol (SYMBICORT) 160-4.5 MCG/ACT inhaler Inhale 2 puffs into the lungs 2 (two) times daily. 1 each 5   Calcium Carbonate-Vit D-Min (CALCIUM 1200 PO) Take by mouth daily.     cetirizine (ZYRTEC) 10 MG tablet Take 1 tablet (10 mg total) by mouth daily. (Patient taking differently: Take 10 mg by mouth as needed.) 30 tablet 11   Chlorpheniramine-DM (DIMETAPP LONG ACT COUGH/COLD) 1-7.5 MG/5ML SYRP Take 10 mLs by mouth every 6 (six) hours as needed. 118 mL 0   clopidogrel (PLAVIX) 75 MG tablet TAKE 1 TABLET(75 MG) BY MOUTH DAILY 90 tablet 0   Coenzyme Q10 (CO Q 10 PO) Take 2 capsules by mouth every morning.     empagliflozin (JARDIANCE) 10 MG TABS tablet TAKE 1 TABLET(10 MG) BY MOUTH DAILY BEFORE BREAKFAST 30 tablet 11   esomeprazole (NEXIUM) 40 MG capsule TAKE ONE CAPSULE BY MOUTH TWICE DAILY (Patient taking differently: 40 mg daily. TAKE ONE CAPSULE BY MOUTH TWICE DAILY) 180 capsule 2   Evolocumab (REPATHA SURECLICK) 140 MG/ML SOAJ Inject 140 mg into the skin every 14 (fourteen) days. 6 mL 3   Folic Acid (FOLATE PO) Take 1 tablet by mouth daily.     furosemide (LASIX) 40 MG tablet TAKE 1 TABLET(40 MG) BY MOUTH DAILY AS NEEDED 30 tablet 2   hydrOXYzine (ATARAX) 25 MG tablet TAKE 1 TABLET(25 MG) BY MOUTH THREE TIMES DAILY AS NEEDED (Patient taking differently: 1 tablet at night when can't sleep) 90 tablet 2   lisinopril (ZESTRIL) 10 MG tablet Take 1 tablet (10 mg total) by mouth daily. Take 1 tab daily 90 tablet 1   Magnesium 400 MG CAPS Take 550 mg by mouth. 2 in the AM and 2 in the PM     meloxicam (MOBIC) 15 MG tablet TAKE 1 TABLET(15 MG) BY MOUTH DAILY 90 tablet 3   Potassium Gluconate 595 MG CAPS Take 400 mg by mouth in the morning, at noon, in the evening, and at bedtime. Take 4 capsules daily      triamcinolone cream (KENALOG) 0.1 %  Apply 1 application. topically 2 (two) times daily. 80 g 1   Facility-Administered Medications Prior to Visit  Medication Dose Route Frequency Provider Last Rate Last Admin   albuterol (PROVENTIL) (2.5 MG/3ML) 0.083% nebulizer solution 2.5 mg  2.5 mg Nebulization Once Bensimhon, Bevelyn Buckles, MD         Patient Active Problem List   Diagnosis Date Noted   Prediabetes 08/25/2022   Pulmonary embolism (HCC) 02/06/2022   Acute on chronic diastolic CHF (congestive heart failure) (HCC) 02/02/2022   COPD with acute exacerbation (HCC) 02/02/2022   Acute on chronic respiratory failure with hypoxia (HCC) 02/02/2022   Pulmonary hypertension (HCC)    Acute low back pain 06/17/2021   Right lower quadrant abdominal pain 04/01/2021   Caregiver role strain 12/05/2020   IFG (impaired fasting glucose) 09/07/2019   Carotid stenosis 12/02/2018   Abdominal aortic aneurysm (AAA) without rupture (HCC) 11/22/2018   Aortic atherosclerosis (HCC) 02/16/2017   Panic disorder 11/28/2016   Hypomagnesemia 05/02/2016   Sleep apnea 03/17/2016   CAD (coronary artery disease) 02/18/2016   Personal history of tobacco use, presenting hazards to health 01/19/2016   Essential hypertension 05/28/2015   Obesity 01/05/2015   Centrilobular emphysema (HCC) 01/05/2015   Fibromyalgia 01/05/2015   Hyperlipidemia 01/05/2015   GERD (gastroesophageal reflux disease) 01/05/2015   Sciatica 01/05/2015   CHF (congestive heart failure) (HCC) 01/05/2015   Insomnia 01/05/2015    Objective:   Vitals:   11/06/22 1051  BP: 137/72  Pulse: (!) 55  Temp: 97.9 F (36.6 C)  Height: 5\' 5"  (1.651 m)  Weight: 163 lb 6.4 oz (74.1 kg)  SpO2: 98%  TempSrc: Oral  BMI (Calculated): 27.19    Body mass index is 27.19 kg/m.  Physical Exam Constitutional:      Appearance: Normal appearance.  HENT:     Head: Normocephalic and atraumatic.  Eyes:     Pupils: Pupils are equal, round, and reactive to  light.  Cardiovascular:     Rate and Rhythm: Normal rate and regular rhythm.     Pulses: Normal pulses.     Heart sounds: Normal heart sounds.  Pulmonary:     Effort: Pulmonary effort is normal.     Breath sounds: Normal breath sounds.  Abdominal:     General: Abdomen is flat.     Palpations: Abdomen is soft.  Musculoskeletal:        General: Normal range of motion.     Cervical back: Normal range of motion.  Skin:    General: Skin is warm and dry.     Capillary Refill: Capillary refill takes less than 2 seconds.  Neurological:     General: No focal deficit present.     Mental Status: She is alert. Mental status is at baseline.  Psychiatric:        Mood and Affect: Mood normal.        Behavior: Behavior normal.    Assessment and Plan:   Annual physical exam Discussed lifestyle modifications and goals including plant based eating styles (such as: Mediterranean eating style), regular exercise (at least 150 min of moderate-intensity aerobic exercise per week), get adequate sleep, and continue working with PCP towards meeting health goals to ensure healthy aging. Medicare annual already complete.  Diabetes mellitus screening -     Hemoglobin A1c  Lipid screening -     Lipid panel  Muscle cramp Adjusted supplements, requesting repeat levels. -     Magnesium  Screen for colon cancer -  Cologuard  Encounter for behavioral health screening As part of their intake evaluation, the patient was screened for depression, anxiety.  PHQ9 SCORE 0, GAD7 SCORE 0. Screening results negative for tested conditions.    This plan was discussed with the patient and questions were answered. There were no further concerns.  Follow up as indicated, or sooner should any new problems arise, if conditions worsen, or if they are otherwise concerned.   See patient instructions for additional information.  Mabell Esguerra Howell Pringle, MD  Family Medicine      Future Appointments  Date Time Provider  Department Center  03/25/2023 10:00 AM [facility]-ECHO 1 [facility] None  04/07/2023  8:00 AM Jackolyn Confer, MD [facility]  11/03/2023 11:10 AM [facility]-ANNUAL WELLNESS VISIT [facility]

## 2022-11-07 LAB — LIPID PANEL
Chol/HDL Ratio: 2.7 {ratio} (ref 0.0–4.4)
Cholesterol, Total: 112 mg/dL (ref 100–199)
HDL: 42 mg/dL (ref >39)
LDL Chol Calc (NIH): 50 mg/dL (ref 0–99)
Triglycerides: 106 mg/dL (ref 0–149)
VLDL Cholesterol Cal: 20 mg/dL (ref 5–40)

## 2022-11-07 LAB — HEMOGLOBIN A1C
Est. average glucose Bld gHb Est-mCnc: 117 mg/dL
Hgb A1c MFr Bld: 5.7 % — ABNORMAL HIGH (ref 4.8–5.6)

## 2022-11-07 LAB — MAGNESIUM: Magnesium: 2.1 mg/dL (ref 1.6–2.3)

## 2022-11-12 ENCOUNTER — Other Ambulatory Visit: Payer: Self-pay | Admitting: Pediatrics

## 2022-11-12 DIAGNOSIS — R21 Rash and other nonspecific skin eruption: Secondary | ICD-10-CM

## 2022-11-12 MED ORDER — NYSTATIN 100000 UNIT/GM EX OINT: 1.0000 | TOPICAL_OINTMENT | Freq: Two times a day (BID) | CUTANEOUS | 0 refills | Status: AC

## 2022-11-17 ENCOUNTER — Other Ambulatory Visit: Payer: Self-pay | Admitting: Nurse Practitioner

## 2022-11-17 DIAGNOSIS — J449 Chronic obstructive pulmonary disease, unspecified: Secondary | ICD-10-CM

## 2022-11-18 NOTE — Telephone Encounter (Signed)
Requested Prescriptions  Pending Prescriptions Disp Refills   budesonide-formoterol (SYMBICORT) 160-4.5 MCG/ACT inhaler [Pharmacy Med Name: SYMBICORT 160/4. (120 ORAL INH)] 10.2 g 5    Sig: INHALE 2 PUFFS INTO THE LUNGS TWICE DAILY     Pulmonology:  Combination Products Passed - 11/17/2022 10:52 AM      Passed - Valid encounter within last 12 months    Recent Outpatient Visits           1 week ago Annual physical exam   [facility] Jackolyn Confer, MD   1 month ago Muscle cramp   [facility] Jackolyn Confer, MD   2 months ago Chronic congestive heart failure, unspecified heart failure type [facility])   [place] [facility] Mecum, Oswaldo Conroy, PA-C   3 months ago Productive cough   [place] [address] [facility] Mecum, Oswaldo Conroy, PA-C   3 months ago Essential hypertension   [facility] Larae Grooms, NP       Future Appointments             In 4 months Evelene Croon, Atilano Median, MD [facility], [facility]

## 2022-11-20 ENCOUNTER — Ambulatory Visit
Admission: RE | Admit: 2022-11-20 | Discharge: 2022-11-20 | Disposition: A | Discharge status: Home / Self Care | Payer: Medicare HMO | Source: Ambulatory Visit | Attending: Pediatrics | Admitting: Pediatrics

## 2022-11-20 DIAGNOSIS — Z1211 Encounter for screening for malignant neoplasm of colon: Secondary | ICD-10-CM | POA: Diagnosis not present

## 2022-11-20 DIAGNOSIS — Z1231 Encounter for screening mammogram for malignant neoplasm of breast: Secondary | ICD-10-CM | POA: Insufficient documentation

## 2022-11-25 ENCOUNTER — Other Ambulatory Visit: Payer: Self-pay | Admitting: Pediatrics

## 2022-11-26 ENCOUNTER — Ambulatory Visit: Payer: 59 | Admitting: Nurse Practitioner

## 2022-11-27 ENCOUNTER — Ambulatory Visit: Payer: Self-pay

## 2022-11-27 ENCOUNTER — Other Ambulatory Visit: Payer: Self-pay | Admitting: Pediatrics

## 2022-11-27 DIAGNOSIS — R195 Other fecal abnormalities: Secondary | ICD-10-CM

## 2022-11-27 LAB — COLOGUARD: COLOGUARD: POSITIVE — AB

## 2022-11-27 NOTE — Progress Notes (Signed)
Placed urgent referral to GI for colonoscopy given recent positive cologuard.

## 2022-11-27 NOTE — Telephone Encounter (Signed)
Chief Complaint: Test results   Disposition: [] ED /[] Urgent Care (no appt availability in office) / [] Appointment(In office/virtual)/ []  [facility]/ [] Home Care/ [] Refused Recommended Disposition /[] [facility]/ [x]  Follow-up with PCP Additional Notes: Patient states she just reviewed her cologuard results on MyChart and she stated she saw positive/ abnormal. Patient wants to know exactly what means. Patient stated she is very nervous because she lost her husband to cancer last month. Patient is tearful over the phone. Care advice was given. Advised patient her PCP would review the report and given recommendations as soon as she completes the review. Patient stated she would like PCP to call her as soon as possible.  Summary: cologuard guard test positive   Pt called upset saying she got her Cologuard test back and it was positive/abnormal.  CB@  [phone]     Reason for Disposition  [1] Caller requests to speak ONLY to PCP AND [2] URGENT question  Answer Assessment - Initial Assessment Questions 1. REASON FOR CALL or QUESTION: "What is your reason for calling today?" or "How can I best help you?" or "What question do you have that I can help answer?"     I can see my cologuard results in MyChart, what does the positive/ abnormal result mean?  Answer Assessment - Initial Assessment Questions 1. REASON FOR CALL or QUESTION: "What is your reason for calling today?" or "How can I best help you?" or "What question do you have that I can help answer?"     I can see my Cologuard results in MyChart, what does the positive/ abnormal  2. CALLER: Document the source of call. (e.g., laboratory, patient).     Patient  Protocols used: Information Only Call - No Triage-A-AH, PCP Call - No Triage-A-AH

## 2022-11-28 ENCOUNTER — Telehealth: Payer: Self-pay

## 2022-11-28 ENCOUNTER — Telehealth: Payer: Self-pay | Admitting: *Deleted

## 2022-11-28 ENCOUNTER — Other Ambulatory Visit: Payer: Self-pay

## 2022-11-28 DIAGNOSIS — Z122 Encounter for screening for malignant neoplasm of respiratory organs: Secondary | ICD-10-CM

## 2022-11-28 DIAGNOSIS — R195 Other fecal abnormalities: Secondary | ICD-10-CM

## 2022-11-28 DIAGNOSIS — Z87891 Personal history of nicotine dependence: Secondary | ICD-10-CM

## 2022-11-28 DIAGNOSIS — Z1211 Encounter for screening for malignant neoplasm of colon: Secondary | ICD-10-CM

## 2022-11-28 NOTE — Telephone Encounter (Signed)
S/w the pt and she has been scheduled for tele pre op appt 12/09/22. Med rec and consent are done.   Pt has a concern about being on limited fluid intake and the amount of fluid she will be taking in when she begins the pre op for the procedure. She wants to make sure Dr. Gala Romney is ok with the procedure and all the fluid she has to drink.   Pt also is asking about Repatha injectio. She states she has been following a cardiac healthy diet and has lost 30 lb's and her cholesterol she said is 113. She is wondering if she will have to continue the Repatha. I assured the pt that I will send a notes to both the pharm-d as well as Dr. Gala Romney to reach out to her about her concerns. Pt said thank you.    Patient Consent for Virtual Visit        Brandi Steele has provided verbal consent on 11/28/2022 for a virtual visit (video or telephone).   CONSENT FOR VIRTUAL VISIT FOR:  Brandi Steele  By participating in this virtual visit I agree to the following:  I hereby voluntarily request, consent and authorize [facility] and its employed or contracted physicians, physician assistants, nurse practitioners or other licensed health care professionals (the Practitioner), to provide me with telemedicine health care services (the "Services") as deemed necessary by the treating Practitioner. I acknowledge and consent to receive the Services by the Practitioner via telemedicine. I understand that the telemedicine visit will involve communicating with the Practitioner through live audiovisual communication technology and the disclosure of certain medical information by electronic transmission. I acknowledge that I have been given the opportunity to request an in-person assessment or other available alternative prior to the telemedicine visit and am voluntarily participating in the telemedicine visit.  I understand that I have the right to withhold or withdraw my consent to the use of telemedicine in  the course of my care at any time, without affecting my right to future care or treatment, and that the Practitioner or I may terminate the telemedicine visit at any time. I understand that I have the right to inspect all information obtained and/or recorded in the course of the telemedicine visit and may receive copies of available information for a reasonable fee.  I understand that some of the potential risks of receiving the Services via telemedicine include:  Delay or interruption in medical evaluation due to technological equipment failure or disruption; Information transmitted may not be sufficient (e.g. poor resolution of images) to allow for appropriate medical decision making by the Practitioner; and/or  In rare instances, security protocols could fail, causing a breach of personal health information.  Furthermore, I acknowledge that it is my responsibility to provide information about my medical history, conditions and care that is complete and accurate to the best of my ability. I acknowledge that Practitioner's advice, recommendations, and/or decision may be based on factors not within their control, such as incomplete or inaccurate data provided by me or distortions of diagnostic images or specimens that may result from electronic transmissions. I understand that the practice of medicine is not an exact science and that Practitioner makes no warranties or guarantees regarding treatment outcomes. I acknowledge that a copy of this consent can be made available to me via my patient portal [facility] MyChart), or I can request a printed copy by calling the office of [facility].    I understand that  my insurance will be billed for this visit.   I have read or had this consent read to me. I understand the contents of this consent, which adequately explains the benefits and risks of the Services being provided via telemedicine.  I have been provided ample opportunity to ask questions  regarding this consent and the Services and have had my questions answered to my satisfaction. I give my informed consent for the services to be provided through the use of telemedicine in my medical care

## 2022-11-28 NOTE — Telephone Encounter (Signed)
   Name: Brandi Steele  DOB: 13-Jan-1956  MRN: [id number]  Primary Cardiologist: Debbe Odea, MD   Preoperative team, please contact this patient and set up a phone call appointment for further preoperative risk assessment. Please obtain consent and complete medication review. Thank you for your help.  [name] colonoscopy is not scheduled for 11/8. She does have a GI office visit.   I confirm that guidance regarding antiplatelet and oral anticoagulation therapy has been completed and, if necessary, noted below.  Per office protocol, he may hold Plavix for 5 days prior to procedure and should resume as soon as hemodynamically stable postoperatively.   I also confirmed the patient resides in the state of [place]. As per [facility] Medical Board telemedicine laws, the patient must reside in the state in which the provider is licensed.   Brandi Levering, NP 11/28/2022, 4:27 PM  [facility]

## 2022-11-28 NOTE — Telephone Encounter (Signed)
S/w the pt and she has been scheduled for tele pre op appt 12/09/22. Med rec and consent are done.    Pt has a concern about being on limited fluid intake and the amount of fluid she will be taking in when she begins the pre op for the procedure. She wants to make sure Dr. Gala Romney is ok with the procedure and all the fluid she has to drink.    Pt also is asking about Repatha injectio. She states she has been following a cardiac healthy diet and has lost 30 lb's and her cholesterol she said is 113. She is wondering if she will have to continue the Repatha. I assured the pt that I will send a notes to both the pharm-d as well as Dr. Gala Romney to reach out to her about her concerns. Pt said thank you.

## 2022-11-28 NOTE — Telephone Encounter (Signed)
Colonoscopy referral received for pt due to positive cologuard 11/20/22. Pt was scheduled last year with Dr Tobi Bastos for colonoscopy but had to cancel due to transportation.  07/22/2021 saw Dr. Tobi Bastos for muscular skeletal pain.  She has extensive cardiac history. Takes Plavix.  She is very anxious, and tearful about having positive cologuard, but recently lost her husband last month. Due to her cardiac history and to help put her at ease she has been scheduled an office visit to see our PA-Tina on 12/05/22 to discuss.  I will send cardiac clearance to CV Preop Team in advance.     Gastroenterology Pre-Procedure Review  Request Date: TBD Requesting Physician: Dr. Jodelle Gross  PATIENT REVIEW QUESTIONS: The patient responded to the following health history questions as indicated:    1. Are you having any GI issues? no 2. Do you have a personal history of Polyps? no 1st colonoscopy, positive cologuard 11/20/22 3. Do you have a family history of Colon Cancer or Polyps? no 4. Diabetes Mellitus? yes (takes Jardiance) 5. Joint replacements in the past 12 months?no 6. Major health problems in the past 3 months?no 7. Any artificial heart valves, MVP, or defibrillator?no 8. Cardiac history? CHF, HTN, CAD, AAA, Carotid Stenosis, Pulmonary HTN, PE    MEDICATIONS & ALLERGIES:    Patient reports the following regarding taking any anticoagulation/antiplatelet therapy:   Plavix, Coumadin, Eliquis, Xarelto, Lovenox, Pradaxa, Brilinta, or Effient? yes (Plavix rx written by Aura Dials, NP) Aspirin? no  Patient confirms/reports the following medications:  Current Outpatient Medications  Medication Sig Dispense Refill   albuterol (VENTOLIN HFA) 108 (90 Base) MCG/ACT inhaler INHALE 2 PUFFS INTO THE LUNGS EVERY 6 HOURS AS NEEDED FOR WHEEZING OR SHORTNESS OF BREATH 18 g 1   ALPRAZolam (XANAX) 0.5 MG tablet Take 1 tablet (0.5 mg total) by mouth 3 (three) times daily as needed for anxiety. 30 tablet 0    budesonide-formoterol (SYMBICORT) 160-4.5 MCG/ACT inhaler INHALE 2 PUFFS INTO THE LUNGS TWICE DAILY 10.2 g 5   Calcium Carbonate-Vit D-Min (CALCIUM 1200 PO) Take by mouth daily.     cetirizine (ZYRTEC) 10 MG tablet Take 1 tablet (10 mg total) by mouth daily. (Patient taking differently: Take 10 mg by mouth as needed.) 30 tablet 11   Chlorpheniramine-DM (DIMETAPP LONG ACT COUGH/COLD) 1-7.5 MG/5ML SYRP Take 10 mLs by mouth every 6 (six) hours as needed. 118 mL 0   clopidogrel (PLAVIX) 75 MG tablet TAKE 1 TABLET(75 MG) BY MOUTH DAILY 90 tablet 0   Coenzyme Q10 (CO Q 10 PO) Take 2 capsules by mouth every morning.     empagliflozin (JARDIANCE) 10 MG TABS tablet TAKE 1 TABLET(10 MG) BY MOUTH DAILY BEFORE BREAKFAST 30 tablet 11   esomeprazole (NEXIUM) 40 MG capsule TAKE ONE CAPSULE BY MOUTH TWICE DAILY (Patient taking differently: 40 mg daily. TAKE ONE CAPSULE BY MOUTH TWICE DAILY) 180 capsule 2   Evolocumab (REPATHA SURECLICK) 140 MG/ML SOAJ Inject 140 mg into the skin every 14 (fourteen) days. 6 mL 3   Folic Acid (FOLATE PO) Take 1 tablet by mouth daily.     furosemide (LASIX) 40 MG tablet TAKE 1 TABLET(40 MG) BY MOUTH DAILY AS NEEDED 30 tablet 2   hydrOXYzine (ATARAX) 25 MG tablet TAKE 1 TABLET(25 MG) BY MOUTH THREE TIMES DAILY AS NEEDED (Patient taking differently: 1 tablet at night when can't sleep) 90 tablet 2   lisinopril (ZESTRIL) 10 MG tablet Take 1 tablet (10 mg total) by mouth daily. Take 1 tab daily 90 tablet  1   Magnesium 400 MG CAPS Take 550 mg by mouth. 2 in the AM and 2 in the PM     meloxicam (MOBIC) 15 MG tablet TAKE 1 TABLET(15 MG) BY MOUTH DAILY 90 tablet 3   nystatin ointment (MYCOSTATIN) Apply 1 Application topically 2 (two) times daily. 30 g 0   Potassium Gluconate 595 MG CAPS Take 400 mg by mouth in the morning, at noon, in the evening, and at bedtime. Take 4 capsules daily     triamcinolone cream (KENALOG) 0.1 % Apply 1 application. topically 2 (two) times daily. 80 g 1   No  current facility-administered medications for this visit.   Facility-Administered Medications Ordered in Other Visits  Medication Dose Route Frequency Provider Last Rate Last Admin   albuterol (PROVENTIL) (2.5 MG/3ML) 0.083% nebulizer solution 2.5 mg  2.5 mg Nebulization Once Bensimhon, Bevelyn Buckles, MD        Patient confirms/reports the following allergies:  Allergies  Allergen Reactions   Morphine And Codeine Nausea And Vomiting   Celexa [Citalopram Hydrobromide] Other (See Comments)    Pruritis    Effexor [Venlafaxine] Other (See Comments)    Panic attack   Lipitor [Atorvastatin] Other (See Comments)    Myalgias   Shellfish Allergy Hives and Swelling   Wellbutrin [Bupropion] Anxiety    No orders of the defined types were placed in this encounter.   AUTHORIZATION INFORMATION Primary Insurance: 1D#: Group #:  Secondary Insurance: 1D#: Group #:  SCHEDULE INFORMATION: Date: TBD Time: Location: [facility]

## 2022-11-28 NOTE — Telephone Encounter (Signed)
   Pre-operative Risk Assessment    Patient Name: Brandi Steele  DOB: 1955/07/24 MRN: [id number]      Request for Surgical Clearance    Procedure:   Colonoscopy  Date of Surgery:  Clearance 12/05/22                                 Surgeon:  Appointment with Celso Amy, PA on 12/05/22 - Name of Physician performing surgery- TBD Surgeon's Group or Practice Name:  [facility]  Phone number:  [phone] Fax number:  [phone]   Type of Clearance Requested:   - Medical  - Pharmacy:  Hold Clopidogrel (Plavix) Not indicated   Type of Anesthesia:  General    Additional requests/questions:    [name]   11/28/2022, 3:36 PM

## 2022-11-29 DIAGNOSIS — G4733 Obstructive sleep apnea (adult) (pediatric): Secondary | ICD-10-CM | POA: Diagnosis not present

## 2022-12-01 NOTE — Telephone Encounter (Signed)
Spoke to patient about Repatha, dicussed role of medication and diet, their ability to lower LDLc and risk reduction benefit of PCSK9i.  Patient denies any affordability issue for Repatha. Advised to continue with Repatha.    Patient is still waiting to hear back from Dr.Bensimhon regarding fluid question.

## 2022-12-02 DIAGNOSIS — J449 Chronic obstructive pulmonary disease, unspecified: Secondary | ICD-10-CM | POA: Diagnosis not present

## 2022-12-03 NOTE — Telephone Encounter (Signed)
I will forward as FYI to the GI office in regard to the pt's concerns about excess fluid intake. Pt has a tele appt 12/09/22 to discuss pre op clearance further.

## 2022-12-04 NOTE — Progress Notes (Signed)
Brandi Amy, PA-C [address]  [address]  [place], [place] [address]  Main: [phone]  Fax: [phone]   Primary Care Physician: Jackolyn Confer, MD  Primary Gastroenterologist:  Brandi Amy, PA-C / Dr. Wyline Mood    CC: Positive Cologuard, RLQ Pain, Weight Loss  HPI: Brandi Steele is a 67 y.o. female is referred to evaluate positive screening Cologuard 11/20/2022, done by her PCP at a wellness exam.  Medical history significant for CHF, COPD, hypertension, CAD, aortic stenosis, carotid stenosis, pulmonary hypertension, history of pulmonary embolus, on home oxygen, sleep apnea on CPAP, PAD s/p abdominal aortic surgery over 20 years ago.   Echo 01/2022 showed EF 60 to 65%, severe pulmonary hypertension.She takes Plavix daily.  Cardiologist Dr. Gala Romney (last follow-up OV 08/2022).  Patient has telehealth visit with cardiology 12/09/2022 to discuss pre-op clearance for colonoscopy.  Dr. Gala Romney has requested for patient to have low volume prep due to her history of CHF and fluid restriction.   Is on Nexium 40 mg once daily for GERD.  She takes OTC MiraLAX as needed for constipation.  No previous colonoscopy.  No family history of colon cancer.    Current symptoms: Patient continues to have right lower quadrant abdominal pain for 1 year.  She quit smoking in 2016.  Denies alcohol use.  She denies rectal bleeding.  Has mild constipation and takes MiraLAX as needed.  She reports significant weight loss in the past 2 years.  Her husband passed away Oct 26, 2022.  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  Patient has also changed to a heart healthy diet which has helped with weight loss.  She denies any upper GI symptoms such as heartburn or dysphagia.  Weight 04/2022 was 192 lb.  07/2022 was 177 lb. 10/2022 was 165 lb.  Weight today is 159 lb.  Her weight is down 33 pounds in the past 6 months.   Current Outpatient Medications  Medication Sig Dispense Refill   albuterol  (VENTOLIN HFA) 108 (90 Base) MCG/ACT inhaler INHALE 2 PUFFS INTO THE LUNGS EVERY 6 HOURS AS NEEDED FOR WHEEZING OR SHORTNESS OF BREATH 18 g 1   ALPRAZolam (XANAX) 0.5 MG tablet Take 1 tablet (0.5 mg total) by mouth 3 (three) times daily as needed for anxiety. 30 tablet 0   budesonide-formoterol (SYMBICORT) 160-4.5 MCG/ACT inhaler INHALE 2 PUFFS INTO THE LUNGS TWICE DAILY 10.2 g 5   Calcium Carbonate-Vit D-Min (CALCIUM 1200 PO) Take by mouth daily.     cetirizine (ZYRTEC) 10 MG tablet Take 1 tablet (10 mg total) by mouth daily. (Patient taking differently: Take 10 mg by mouth as needed.) 30 tablet 11   Chlorpheniramine-DM (DIMETAPP LONG ACT COUGH/COLD) 1-7.5 MG/5ML SYRP Take 10 mLs by mouth every 6 (six) hours as needed. 118 mL 0   clopidogrel (PLAVIX) 75 MG tablet TAKE 1 TABLET(75 MG) BY MOUTH DAILY 90 tablet 0   Coenzyme Q10 (CO Q 10 PO) Take 2 capsules by mouth every morning.     empagliflozin (JARDIANCE) 10 MG TABS tablet TAKE 1 TABLET(10 MG) BY MOUTH DAILY BEFORE BREAKFAST 30 tablet 11   esomeprazole (NEXIUM) 40 MG capsule TAKE ONE CAPSULE BY MOUTH TWICE DAILY (Patient taking differently: 40 mg daily. TAKE ONE CAPSULE BY MOUTH TWICE DAILY) 180 capsule 2   Evolocumab (REPATHA SURECLICK) 140 MG/ML SOAJ Inject 140 mg into the skin every 14 (fourteen) days. 6 mL 3   Folic Acid (FOLATE PO) Take 1 tablet by  mouth daily.     furosemide (LASIX) 40 MG tablet TAKE 1 TABLET(40 MG) BY MOUTH DAILY AS NEEDED 30 tablet 2   hydrOXYzine (ATARAX) 25 MG tablet TAKE 1 TABLET(25 MG) BY MOUTH THREE TIMES DAILY AS NEEDED (Patient taking differently: 1 tablet at night when can't sleep) 90 tablet 2   lisinopril (ZESTRIL) 10 MG tablet Take 1 tablet (10 mg total) by mouth daily. Take 1 tab daily 90 tablet 1   Magnesium 400 MG CAPS Take 550 mg by mouth. 2 in the AM and 2 in the PM     meloxicam (MOBIC) 15 MG tablet TAKE 1 TABLET(15 MG) BY MOUTH DAILY 90 tablet 3   nystatin ointment (MYCOSTATIN) Apply 1 Application  topically 2 (two) times daily. 30 g 0   Potassium Gluconate 595 MG CAPS Take 400 mg by mouth in the morning, at noon, in the evening, and at bedtime. Take 4 capsules daily     triamcinolone cream (KENALOG) 0.1 % Apply 1 application. topically 2 (two) times daily. 80 g 1   No current facility-administered medications for this visit.   Facility-Administered Medications Ordered in Other Visits  Medication Dose Route Frequency Provider Last Rate Last Admin   albuterol (PROVENTIL) (2.5 MG/3ML) 0.083% nebulizer solution 2.5 mg  2.5 mg Nebulization Once Bensimhon, Bevelyn Buckles, MD        Allergies as of 12/05/2022 - Review Complete 12/05/2022  Allergen Reaction Noted   Morphine and codeine Nausea And Vomiting 01/07/2015   Celexa [citalopram hydrobromide] Other (See Comments) 01/05/2015   Effexor [venlafaxine] Other (See Comments) 01/05/2015   Lipitor [atorvastatin] Other (See Comments) 07/18/2019   Shellfish allergy Hives and Swelling 01/07/2015   Wellbutrin [bupropion] Anxiety 01/05/2015    Past Medical History:  Diagnosis Date   Anxiety    Aortic stenosis    CHF (congestive heart failure) (HCC)    COPD (chronic obstructive pulmonary disease) (HCC)    Depression    Emphysema of lung (HCC)    Fibromyalgia    GERD (gastroesophageal reflux disease)    Hyperlipidemia    Hypertension    Hypertensive heart disease    Myalgia    Obesity    Sciatica    Seborrheic keratosis    Stress incontinence     Past Surgical History:  Procedure Laterality Date   RIGHT HEART CATH N/A 10/07/2021   Procedure: RIGHT HEART CATH;  Surgeon: Iran Ouch, MD;  Location: [facility] INVASIVE CV LAB;  Service: Cardiovascular;  Laterality: N/A;   TONSILLECTOMY     TOTAL ABDOMINAL HYSTERECTOMY     partial    Review of Systems:    All systems reviewed and negative except where noted in HPI.   Physical Examination:   BP (!) 116/52 (BP Location: Left Arm, Patient Position: Sitting, Cuff Size: Normal)   Pulse  (!) 57   Temp 98.3 F (36.8 C) (Oral)   Ht 5\' 5"  (1.651 m)   Wt 159 lb 8 oz (72.3 kg)   LMP  (LMP Unknown)   BMI 26.54 kg/m   General: Well-nourished, well-developed in no acute distress.  Lungs: Clear to auscultation bilaterally. Non-labored. Heart: Regular rate and rhythm, no murmurs rubs or gallops.  Abdomen: Bowel sounds are normal; Abdomen is Soft; No hepatosplenomegaly, masses or hernias; mild to moderate RLQ abdominal Tenderness; rest of abdomen is not tender.  No guarding or rebound tenderness. Neuro: Alert and oriented x 3.  Grossly intact.  Psych: Alert and cooperative, anxious and depressed mood and affect.  Imaging Studies: MM 3D SCREENING MAMMOGRAM BILATERAL BREAST  Result Date: 11/24/2022 CLINICAL DATA:  Screening. EXAM: DIGITAL SCREENING BILATERAL MAMMOGRAM WITH TOMOSYNTHESIS AND CAD TECHNIQUE: Bilateral screening digital craniocaudal and mediolateral oblique mammograms were obtained. Bilateral screening digital breast tomosynthesis was performed. The images were evaluated with computer-aided detection. COMPARISON:  Previous exam(s). ACR Breast Density Category a: The breasts are almost entirely fatty. FINDINGS: There are no findings suspicious for malignancy. IMPRESSION: No mammographic evidence of malignancy. A result letter of this screening mammogram will be mailed directly to the patient. RECOMMENDATION: Screening mammogram in one year. (Code:SM-B-01Y) BI-RADS CATEGORY  1: Negative. Electronically Signed   By: Amie Portland M.D.   On: 11/24/2022 12:13    Assessment and Plan:   ELIRA LABOR is a 67 y.o. y/o female presents for:  1.  Positive Cologuard  I strongly recommended for patient to schedule a colonoscopy to evaluate for colon cancer or colon polyps.  I discussed risks of colonoscopy with patient to include risk of bleeding, colon perforation, and risk of sedation.  Patient expressed understanding.  Per cardiology recommendation, will give a small volume  prep.  We discussed Suprep versus Sutab.  Patient requested Sutab pill prep.    Patient declined to schedule colonoscopy today.  She needs to think about the procedure.  She is aware that we could be missing a colon cancer.  She agrees to proceed with abdominal pelvic CT first.  She will let us know if she decides to schedule colonoscopy.  2.  Multiple comorbidities: COPD, CHF (EF 60 to 65%), CAD, PAD, history of PE and is on Plavix. Not on aspirin.  We will need to request cardiac and pulmonary clearance for colonoscopy procedure if patient decides to do the procedure.   3.  Constipation  Continue MiraLAX 1 capful once or twice daily.  4.  RLQ Pain x 1 year  Labs: CBC, CMP  CT abd / pelvis w/ contrast  5.  Weight Loss  Lab: TSH  CT abd / pelvis w/ contrast  Brandi Amy, PA-C  Follow up in 4 weeks with TG.

## 2022-12-05 ENCOUNTER — Ambulatory Visit (INDEPENDENT_AMBULATORY_CARE_PROVIDER_SITE_OTHER): Payer: Medicare HMO | Admitting: Physician Assistant

## 2022-12-05 ENCOUNTER — Encounter: Payer: Self-pay | Admitting: Physician Assistant

## 2022-12-05 VITALS — BP 116/52 | HR 57 | Temp 98.3°F | Ht 65.0 in | Wt 159.5 lb

## 2022-12-05 DIAGNOSIS — R195 Other fecal abnormalities: Secondary | ICD-10-CM

## 2022-12-05 DIAGNOSIS — R634 Abnormal weight loss: Secondary | ICD-10-CM | POA: Diagnosis not present

## 2022-12-05 DIAGNOSIS — R1031 Right lower quadrant pain: Secondary | ICD-10-CM

## 2022-12-05 DIAGNOSIS — K5900 Constipation, unspecified: Secondary | ICD-10-CM

## 2022-12-05 NOTE — Patient Instructions (Addendum)
Got your CT scan schedule for you on 12/16/2022 check in 2:15pm for 2:30pm scan at [facility]. If you need to reschedule please call [phone] option 3 and then 2.

## 2022-12-08 ENCOUNTER — Other Ambulatory Visit: Payer: Self-pay | Admitting: Nurse Practitioner

## 2022-12-08 ENCOUNTER — Ambulatory Visit
Admission: RE | Admit: 2022-12-08 | Discharge: 2022-12-08 | Disposition: A | Discharge status: Home / Self Care | Payer: Medicare HMO | Source: Ambulatory Visit | Attending: Pediatrics | Admitting: Pediatrics

## 2022-12-08 DIAGNOSIS — Z87891 Personal history of nicotine dependence: Secondary | ICD-10-CM | POA: Insufficient documentation

## 2022-12-08 DIAGNOSIS — Z122 Encounter for screening for malignant neoplasm of respiratory organs: Secondary | ICD-10-CM | POA: Diagnosis not present

## 2022-12-08 NOTE — Progress Notes (Unsigned)
Virtual Visit via Telephone Note   Because of Brandi Steele's co-morbid illnesses, she is at least at moderate risk for complications without adequate follow up.  This format is felt to be most appropriate for this patient at this time.  The patient did not have access to video technology/had technical difficulties with video requiring transitioning to audio format only (telephone).  All issues noted in this document were discussed and addressed.  No physical exam could be performed with this format.  Please refer to the patient's chart for her consent to telehealth for [facility].  Evaluation Performed:  Preoperative cardiovascular risk assessment _____________   Date:  12/09/2022   Patient ID:  Brandi Steele, DOB 08-12-1955, MRN [id number] Patient Location:  Home Provider location:   Office  Primary Care Provider:  Jackolyn Confer, MD Primary Cardiologist:  Debbe Odea, MD  Chief Complaint / Patient Profile   67 y.o. y/o female with a h/o hx of CAD (RCA calcium on chest CT), HTN, HL, former smoker x 40+ years, pulmonary hypertension, COPD, chronic hypoxic respiratory failure on home O2, OSA on CPAP, PAD s/p abdominal aortic surgery over 20 years ago and chronic diastolic HF, and pulmonary HTN.   She is pending colonoscopy by [facility], on date to be determined, and presents today for telephonic preoperative cardiovascular risk assessment.  History of Present Illness    Brandi Steele is a 67 y.o. female who presents via audio/video conferencing for a telehealth visit today.  Pt was last seen in cardiology clinic on 09/25/2022 by Dr.Bensimhon for pulmonary hypertension. She is also being followed by pharmacist, and is Repatha.  At that time GIULIANNA Steele was doing well .  The patient is now pending procedure as outlined above. Since her last visit, she takes walks daily has lost 30 lbs on heart healthy diet, remains active.   Past Medical History    Past  Medical History:  Diagnosis Date   Anxiety    Aortic stenosis    CHF (congestive heart failure) (HCC)    COPD (chronic obstructive pulmonary disease) (HCC)    Depression    Emphysema of lung (HCC)    Fibromyalgia    GERD (gastroesophageal reflux disease)    Hyperlipidemia    Hypertension    Hypertensive heart disease    Myalgia    Obesity    Sciatica    Seborrheic keratosis    Stress incontinence    Past Surgical History:  Procedure Laterality Date   RIGHT HEART CATH N/A 10/07/2021   Procedure: RIGHT HEART CATH;  Surgeon: Iran Ouch, MD;  Location: [facility] INVASIVE CV LAB;  Service: Cardiovascular;  Laterality: N/A;   TONSILLECTOMY     TOTAL ABDOMINAL HYSTERECTOMY     partial    Allergies  Allergies  Allergen Reactions   Morphine And Codeine Nausea And Vomiting   Celexa [Citalopram Hydrobromide] Other (See Comments)    Pruritis    Effexor [Venlafaxine] Other (See Comments)    Panic attack   Lipitor [Atorvastatin] Other (See Comments)    Myalgias   Shellfish Allergy Hives and Swelling   Wellbutrin [Bupropion] Anxiety    Home Medications    Prior to Admission medications   Medication Sig Start Date End Date Taking? Authorizing Provider  albuterol (VENTOLIN HFA) 108 (90 Base) MCG/ACT inhaler INHALE 2 PUFFS INTO THE LUNGS EVERY 6 HOURS AS NEEDED FOR WHEEZING OR SHORTNESS OF BREATH 05/25/20   Larae Grooms, NP  ALPRAZolam Prudy Feeler)  0.5 MG tablet Take 1 tablet (0.5 mg total) by mouth 3 (three) times daily as needed for anxiety. 08/25/22   Mecum, Erin E, PA-C  budesonide-formoterol (SYMBICORT) 160-4.5 MCG/ACT inhaler INHALE 2 PUFFS INTO THE LUNGS TWICE DAILY 11/18/22   Jackolyn Confer, MD  Calcium Carbonate-Vit D-Min (CALCIUM 1200 PO) Take by mouth daily.    [provider]  cetirizine (ZYRTEC) 10 MG tablet Take 1 tablet (10 mg total) by mouth daily. Patient taking differently: Take 10 mg by mouth as needed. 03/03/19   Particia Nearing, PA-C   Chlorpheniramine-DM (DIMETAPP LONG ACT COUGH/COLD) 1-7.5 MG/5ML SYRP Take 10 mLs by mouth every 6 (six) hours as needed. 08/12/22   Mecum, Erin E, PA-C  clopidogrel (PLAVIX) 75 MG tablet TAKE 1 TABLET(75 MG) BY MOUTH DAILY 08/05/22   Cannady, Jolene T, NP  Coenzyme Q10 (CO Q 10 PO) Take 2 capsules by mouth every morning.    [provider]  empagliflozin (JARDIANCE) 10 MG TABS tablet TAKE 1 TABLET(10 MG) BY MOUTH DAILY BEFORE BREAKFAST 07/09/22   Sabharwal, Aditya, DO  esomeprazole (NEXIUM) 40 MG capsule TAKE ONE CAPSULE BY MOUTH TWICE DAILY Patient taking differently: 40 mg daily. TAKE ONE CAPSULE BY MOUTH TWICE DAILY 02/05/22   Larae Grooms, NP  Evolocumab (REPATHA SURECLICK) 140 MG/ML SOAJ Inject 140 mg into the skin every 14 (fourteen) days. 06/25/22   Debbe Odea, MD  Folic Acid (FOLATE PO) Take 1 tablet by mouth daily.    [provider]  furosemide (LASIX) 40 MG tablet TAKE 1 TABLET(40 MG) BY MOUTH DAILY AS NEEDED 10/31/22   Jackolyn Confer, MD  hydrOXYzine (ATARAX) 25 MG tablet TAKE 1 TABLET(25 MG) BY MOUTH THREE TIMES DAILY AS NEEDED Patient taking differently: 1 tablet at night when can't sleep 01/30/21   Larae Grooms, NP  lisinopril (ZESTRIL) 10 MG tablet Take 1 tablet (10 mg total) by mouth daily. Take 1 tab daily 06/27/22   Larae Grooms, NP  Magnesium 400 MG CAPS Take 550 mg by mouth. 2 in the AM and 2 in the PM    [provider]  meloxicam (MOBIC) 15 MG tablet TAKE 1 TABLET(15 MG) BY MOUTH DAILY 02/05/22   Larae Grooms, NP  nystatin ointment (MYCOSTATIN) Apply 1 Application topically 2 (two) times daily. 11/12/22   Jackolyn Confer, MD  Potassium Gluconate 595 MG CAPS Take 400 mg by mouth in the morning, at noon, in the evening, and at bedtime. Take 4 capsules daily    [provider]  triamcinolone cream (KENALOG) 0.1 % Apply 1 application. topically 2 (two) times daily. 04/22/21   Larae Grooms, NP    Physical Exam    Vital  Signs:  [name] does not have vital signs available for review today.  Given telephonic nature of communication, physical exam is limited. AAOx3. NAD. Normal affect.  Speech and respirations are unlabored.  Accessory Clinical Findings    None  Assessment & Plan    1.  Preoperative Cardiovascular Risk Assessment:  According to the Revised Cardiac Risk Index (RCRI), her Perioperative Risk of Major Cardiac Event is (%): 0.9  Her Functional Capacity in METs is: 6.79 according to the Duke Activity Status Index (DASI).   The patient was advised that if she develops new symptoms prior to surgery to contact our office to arrange for a follow-up visit, and she verbalized understanding.  Per office protocol, she may hold Plavix for 5 days prior to procedure and should resume as  soon as hemodynamically stable postoperatively.   Therefore, based on ACC/AHA guidelines, patient would be at acceptable risk for the planned procedure without further cardiovascular testing. I will route this recommendation to the requesting party via Epic fax function.   A copy of this note will be routed to requesting surgeon.  Time:   Today, I have spent 10 minutes with the patient with telehealth technology discussing medical history, symptoms, and management plan.     Joni Reining, NP  12/09/2022, 9:24 AM

## 2022-12-09 ENCOUNTER — Telehealth: Payer: Self-pay

## 2022-12-09 ENCOUNTER — Ambulatory Visit: Payer: Medicare HMO | Attending: Cardiology

## 2022-12-09 DIAGNOSIS — Z01810 Encounter for preprocedural cardiovascular examination: Secondary | ICD-10-CM | POA: Diagnosis not present

## 2022-12-09 DIAGNOSIS — Z01818 Encounter for other preprocedural examination: Secondary | ICD-10-CM

## 2022-12-09 NOTE — Telephone Encounter (Signed)
Requested Prescriptions  Pending Prescriptions Disp Refills   lisinopril (ZESTRIL) 10 MG tablet [Pharmacy Med Name: LISINOPRIL 10MG  TABLETS] 90 tablet 1    Sig: TAKE 1 TABLET(10 MG) BY MOUTH DAILY     Cardiovascular:  ACE Inhibitors Passed - 12/08/2022  3:57 PM      Passed - Cr in normal range and within 180 days    Creatinine  Date Value Ref Range Status  06/16/2013 0.63 0.60 - 1.30 mg/dL Final   Creatinine, Ser  Date Value Ref Range Status  08/25/2022 0.73 0.57 - 1.00 mg/dL Final         Passed - K in normal range and within 180 days    Potassium  Date Value Ref Range Status  08/25/2022 4.2 3.5 - 5.2 mmol/L Final  06/16/2013 4.0 3.5 - 5.1 mmol/L Final         Passed - Patient is not pregnant      Passed - Last BP in normal range    BP Readings from Last 1 Encounters:  12/05/22 (!) 116/52         Passed - Valid encounter within last 6 months    Recent Outpatient Visits           1 month ago Annual physical exam   [place] [facility] Jackolyn Confer, MD   2 months ago Muscle cramp   [place] [facility] Jackolyn Confer, MD   3 months ago Chronic congestive heart failure, unspecified heart failure type [facility])   [facility] Mecum, Oswaldo Conroy, PA-C   4 months ago Productive cough   [place] [address] [facility] Mecum, Oswaldo Conroy, PA-C   4 months ago Essential hypertension   [place] [facility] Larae Grooms, NP       Future Appointments             In 3 months Evelene Croon, Atilano Median, MD [facility]

## 2022-12-09 NOTE — Telephone Encounter (Signed)
Cardiac clearance received from Joni Reining -acceptable  risk may hold Plvix 5 days prior to procedure and resume as soon a possible after procedure  Patient notified when Procedures are scheduled to be sure and hold Plavix 5 days prior.

## 2022-12-16 ENCOUNTER — Other Ambulatory Visit
Admission: RE | Admit: 2022-12-16 | Discharge: 2022-12-16 | Disposition: A | Discharge status: Home / Self Care | Payer: Medicare HMO | Source: Home / Self Care | Attending: Gastroenterology | Admitting: Gastroenterology

## 2022-12-16 ENCOUNTER — Ambulatory Visit
Admission: RE | Admit: 2022-12-16 | Discharge: 2022-12-16 | Disposition: A | Discharge status: Home / Self Care | Payer: Medicare HMO | Source: Ambulatory Visit | Attending: Gastroenterology | Admitting: Gastroenterology

## 2022-12-16 DIAGNOSIS — R1031 Right lower quadrant pain: Secondary | ICD-10-CM | POA: Diagnosis not present

## 2022-12-16 DIAGNOSIS — I7143 Infrarenal abdominal aortic aneurysm, without rupture: Secondary | ICD-10-CM | POA: Diagnosis not present

## 2022-12-16 DIAGNOSIS — R634 Abnormal weight loss: Secondary | ICD-10-CM | POA: Diagnosis not present

## 2022-12-16 DIAGNOSIS — R195 Other fecal abnormalities: Secondary | ICD-10-CM | POA: Insufficient documentation

## 2022-12-16 LAB — CBC WITH DIFFERENTIAL/PLATELET
Abs Immature Granulocytes: 0.02 10*3/uL (ref 0.00–0.07)
Basophils Absolute: 0.1 10*3/uL (ref 0.0–0.1)
Basophils Relative: 1 %
Eosinophils Absolute: 0.1 10*3/uL (ref 0.0–0.5)
Eosinophils Relative: 2 %
HCT: 40.9 % (ref 36.0–46.0)
Hemoglobin: 13.1 g/dL (ref 12.0–15.0)
Immature Granulocytes: 0 %
Lymphocytes Relative: 19 %
Lymphs Abs: 1.3 10*3/uL (ref 0.7–4.0)
MCH: 28.4 pg (ref 26.0–34.0)
MCHC: 32 g/dL (ref 30.0–36.0)
MCV: 88.5 fL (ref 80.0–100.0)
Monocytes Absolute: 0.5 10*3/uL (ref 0.1–1.0)
Monocytes Relative: 8 %
Neutro Abs: 4.9 10*3/uL (ref 1.7–7.7)
Neutrophils Relative %: 70 %
Platelets: 163 10*3/uL (ref 150–400)
RBC: 4.62 MIL/uL (ref 3.87–5.11)
RDW: 14.4 % (ref 11.5–15.5)
WBC: 6.9 10*3/uL (ref 4.0–10.5)
nRBC: 0 % (ref 0.0–0.2)

## 2022-12-16 LAB — COMPREHENSIVE METABOLIC PANEL
ALT: 12 U/L (ref 0–44)
AST: 16 U/L (ref 15–41)
Albumin: 4 g/dL (ref 3.5–5.0)
Alkaline Phosphatase: 58 U/L (ref 38–126)
Anion gap: 10 (ref 5–15)
BUN: 16 mg/dL (ref 8–23)
CO2: 27 mmol/L (ref 22–32)
Calcium: 8.8 mg/dL — ABNORMAL LOW (ref 8.9–10.3)
Chloride: 100 mmol/L (ref 98–111)
Creatinine, Ser: 0.66 mg/dL (ref 0.44–1.00)
GFR, Estimated: >60 mL/min (ref >60)
Glucose, Bld: 96 mg/dL (ref 70–99)
Potassium: 4.3 mmol/L (ref 3.5–5.1)
Sodium: 137 mmol/L (ref 135–145)
Total Bilirubin: 0.4 mg/dL (ref <1.2)
Total Protein: 6.8 g/dL (ref 6.5–8.1)

## 2022-12-16 LAB — POCT I-STAT CREATININE: Creatinine, Ser: 0.9 mg/dL (ref 0.44–1.00)

## 2022-12-16 LAB — TSH: TSH: 1.04 u[IU]/mL (ref 0.350–4.500)

## 2022-12-16 MED ORDER — IOHEXOL 300 MG/ML  SOLN
100.0000 mL | Freq: Once | INTRAMUSCULAR | Status: AC | PRN
  Administered 2022-12-16: 100 mL via INTRAVENOUS

## 2022-12-29 ENCOUNTER — Telehealth: Payer: Self-pay

## 2022-12-29 DIAGNOSIS — G4733 Obstructive sleep apnea (adult) (pediatric): Secondary | ICD-10-CM | POA: Diagnosis not present

## 2022-12-29 NOTE — Telephone Encounter (Signed)
-----   Message from [facility] sent at 12/28/2022 12:28 PM EST ----- Brandi Steele  Please inform patient that the CT scan of her abdomen did not reveal any GI pathology.  She has benign cyst in her Left ovary and abdominal aortic aneurysm.  She should follow-up with her PCP about these findings  Dr. Evelene Croon  Please note about 6.1 cm benign-appearing left adnexal cyst and recommended follow-up ultrasound in 6 to 12 months. 3.6 cm infrarenal abdominal aortic aneurysm.  Looks like she is already seeing vascular surgery about her aneurysm  Thanks all  Rohini Vanga

## 2022-12-29 NOTE — Telephone Encounter (Signed)
Patient verbalized understanding of results and she will follow up with PCP

## 2023-01-01 DIAGNOSIS — G4733 Obstructive sleep apnea (adult) (pediatric): Secondary | ICD-10-CM | POA: Diagnosis not present

## 2023-01-01 DIAGNOSIS — J449 Chronic obstructive pulmonary disease, unspecified: Secondary | ICD-10-CM | POA: Diagnosis not present

## 2023-01-02 ENCOUNTER — Other Ambulatory Visit: Payer: Self-pay

## 2023-01-02 DIAGNOSIS — Z87891 Personal history of nicotine dependence: Secondary | ICD-10-CM

## 2023-01-02 DIAGNOSIS — Z122 Encounter for screening for malignant neoplasm of respiratory organs: Secondary | ICD-10-CM

## 2023-01-05 NOTE — Progress Notes (Signed)
She's had some growth of her aneurysm, can we call her with a AAA study to evaluate and see me or Brandi Steele

## 2023-01-07 NOTE — Progress Notes (Signed)
Celso Amy, PA-C [address]  [address]  [place], [place] [address]  Main: [phone]  Fax: [phone]   Primary Care Physician: Jackolyn Confer, MD  Primary Gastroenterologist:  ***  CC:  HPI: Brandi Steele is a 67 y.o. female  Current Outpatient Medications  Medication Sig Dispense Refill   albuterol (VENTOLIN HFA) 108 (90 Base) MCG/ACT inhaler INHALE 2 PUFFS INTO THE LUNGS EVERY 6 HOURS AS NEEDED FOR WHEEZING OR SHORTNESS OF BREATH 18 g 1   ALPRAZolam (XANAX) 0.5 MG tablet Take 1 tablet (0.5 mg total) by mouth 3 (three) times daily as needed for anxiety. 30 tablet 0   budesonide-formoterol (SYMBICORT) 160-4.5 MCG/ACT inhaler INHALE 2 PUFFS INTO THE LUNGS TWICE DAILY 10.2 g 5   Calcium Carbonate-Vit D-Min (CALCIUM 1200 PO) Take by mouth daily.     cetirizine (ZYRTEC) 10 MG tablet Take 1 tablet (10 mg total) by mouth daily. (Patient taking differently: Take 10 mg by mouth as needed.) 30 tablet 11   Chlorpheniramine-DM (DIMETAPP LONG ACT COUGH/COLD) 1-7.5 MG/5ML SYRP Take 10 mLs by mouth every 6 (six) hours as needed. 118 mL 0   clopidogrel (PLAVIX) 75 MG tablet TAKE 1 TABLET(75 MG) BY MOUTH DAILY 90 tablet 0   Coenzyme Q10 (CO Q 10 PO) Take 2 capsules by mouth every morning.     empagliflozin (JARDIANCE) 10 MG TABS tablet TAKE 1 TABLET(10 MG) BY MOUTH DAILY BEFORE BREAKFAST 30 tablet 11   esomeprazole (NEXIUM) 40 MG capsule TAKE ONE CAPSULE BY MOUTH TWICE DAILY (Patient taking differently: 40 mg daily. TAKE ONE CAPSULE BY MOUTH TWICE DAILY) 180 capsule 2   Evolocumab (REPATHA SURECLICK) 140 MG/ML SOAJ Inject 140 mg into the skin every 14 (fourteen) days. 6 mL 3   Folic Acid (FOLATE PO) Take 1 tablet by mouth daily.     furosemide (LASIX) 40 MG tablet TAKE 1 TABLET(40 MG) BY MOUTH DAILY AS NEEDED 30 tablet 2   hydrOXYzine (ATARAX) 25 MG tablet TAKE 1 TABLET(25 MG) BY MOUTH THREE TIMES DAILY AS NEEDED (Patient taking differently: 1 tablet at night when can't sleep) 90  tablet 2   lisinopril (ZESTRIL) 10 MG tablet TAKE 1 TABLET(10 MG) BY MOUTH DAILY 90 tablet 1   Magnesium 400 MG CAPS Take 550 mg by mouth. 2 in the AM and 2 in the PM     meloxicam (MOBIC) 15 MG tablet TAKE 1 TABLET(15 MG) BY MOUTH DAILY 90 tablet 3   nystatin ointment (MYCOSTATIN) Apply 1 Application topically 2 (two) times daily. 30 g 0   Potassium Gluconate 595 MG CAPS Take 400 mg by mouth in the morning, at noon, in the evening, and at bedtime. Take 4 capsules daily     triamcinolone cream (KENALOG) 0.1 % Apply 1 application. topically 2 (two) times daily. 80 g 1   No current facility-administered medications for this visit.   Facility-Administered Medications Ordered in Other Visits  Medication Dose Route Frequency Provider Last Rate Last Admin   albuterol (PROVENTIL) (2.5 MG/3ML) 0.083% nebulizer solution 2.5 mg  2.5 mg Nebulization Once Bensimhon, Bevelyn Buckles, MD        Allergies as of 01/08/2023 - Review Complete 12/05/2022  Allergen Reaction Noted   Morphine and codeine Nausea And Vomiting 01/07/2015   Celexa [citalopram hydrobromide] Other (See Comments) 01/05/2015   Effexor [venlafaxine] Other (See Comments) 01/05/2015   Lipitor [atorvastatin] Other (See Comments) 07/18/2019   Shellfish allergy Hives and Swelling 01/07/2015   Wellbutrin [bupropion] Anxiety  01/05/2015    Past Medical History:  Diagnosis Date   Anxiety    Aortic stenosis    CHF (congestive heart failure) (HCC)    COPD (chronic obstructive pulmonary disease) (HCC)    Depression    Emphysema of lung (HCC)    Fibromyalgia    GERD (gastroesophageal reflux disease)    Hyperlipidemia    Hypertension    Hypertensive heart disease    Myalgia    Obesity    Sciatica    Seborrheic keratosis    Stress incontinence     Past Surgical History:  Procedure Laterality Date   RIGHT HEART CATH N/A 10/07/2021   Procedure: RIGHT HEART CATH;  Surgeon: Iran Ouch, MD;  Location: [facility] INVASIVE CV LAB;  Service:  Cardiovascular;  Laterality: N/A;   TONSILLECTOMY     TOTAL ABDOMINAL HYSTERECTOMY     partial    Review of Systems:    All systems reviewed and negative except where noted in HPI.   Physical Examination:   LMP  (LMP Unknown)   General: Well-nourished, well-developed in no acute distress.  Lungs: Clear to auscultation bilaterally. Non-labored. Heart: Regular rate and rhythm, no murmurs rubs or gallops.  Abdomen: Bowel sounds are normal; Abdomen is Soft; No hepatosplenomegaly, masses or hernias;  No Abdominal Tenderness; No guarding or rebound tenderness. Neuro: Alert and oriented x 3.  Grossly intact.  Psych: Alert and cooperative, normal mood and affect.   Imaging Studies: CT ABDOMEN PELVIS W CONTRAST  Result Date: 12/25/2022 CLINICAL DATA:  Right lower quadrant pain. Weight loss. Positive Cologuard test. EXAM: CT ABDOMEN AND PELVIS WITH CONTRAST TECHNIQUE: Multidetector CT imaging of the abdomen and pelvis was performed using the standard protocol following bolus administration of intravenous contrast. RADIATION DOSE REDUCTION: This exam was performed according to the departmental dose-optimization program which includes automated exposure control, adjustment of the mA and/or kV according to patient size and/or use of iterative reconstruction technique. CONTRAST:  OMNIPAQUE IOHEXOL 300 MG/ML  SOLN COMPARISON:  None Available. FINDINGS: Lower Chest: No acute findings. Hepatobiliary: No suspicious hepatic masses identified. Gallbladder is unremarkable. No evidence of biliary ductal dilatation. Pancreas:  No mass or inflammatory changes. Spleen: Within normal limits in size and appearance. Adrenals/Urinary Tract: No suspicious masses identified. No evidence of ureteral calculi or hydronephrosis. Stomach/Bowel: No evidence of obstruction, inflammatory process or abnormal fluid collections. No mass identified. Vascular/Lymphatic: No pathologically enlarged lymph nodes. 3.6 cm infrarenal  abdominal aortic aneurysm is seen. No acute vascular findings. Reproductive: Prior hysterectomy noted. A benign-appearing cyst is seen in the left adnexa, which measures 6.1 x 5.4 cm. No evidence of inflammatory changes or free fluid. Other:  None. Musculoskeletal:  No suspicious bone lesions identified. IMPRESSION: 6.1 cm benign-appearing left adnexal cyst. Recommend follow-up US in 6-12 months. Reference: JACR 2020 Feb; 17(2):248-254 3.6 cm infrarenal abdominal aortic aneurysm. Recommend follow-up ultrasound every 3 years. (Ref.: J Vasc Surg. 2018; 67:2-77 and J Am Coll Radiol 2013;10(10):789-794.) Electronically Signed   By: Danae Orleans M.D.   On: 12/25/2022 10:31    Assessment and Plan:   NASTASSJA Steele is a 67 y.o. y/o female ***    Celso Amy, PA-C  Follow up ***  BP check ***

## 2023-01-08 ENCOUNTER — Ambulatory Visit: Payer: Medicare HMO | Admitting: Physician Assistant

## 2023-01-08 ENCOUNTER — Encounter: Payer: Self-pay | Admitting: Physician Assistant

## 2023-01-08 VITALS — BP 93/58 | HR 71 | Temp 98.2°F | Ht 65.0 in | Wt 158.8 lb

## 2023-01-08 DIAGNOSIS — K5900 Constipation, unspecified: Secondary | ICD-10-CM | POA: Diagnosis not present

## 2023-01-08 DIAGNOSIS — R195 Other fecal abnormalities: Secondary | ICD-10-CM | POA: Diagnosis not present

## 2023-01-08 DIAGNOSIS — R1031 Right lower quadrant pain: Secondary | ICD-10-CM | POA: Diagnosis not present

## 2023-01-08 DIAGNOSIS — R634 Abnormal weight loss: Secondary | ICD-10-CM | POA: Diagnosis not present

## 2023-01-08 DIAGNOSIS — I2510 Atherosclerotic heart disease of native coronary artery without angina pectoris: Secondary | ICD-10-CM

## 2023-01-08 MED ORDER — NA SULFATE-K SULFATE-MG SULF 17.5-3.13-1.6 GM/177ML PO SOLN: 1.0000 | Freq: Once | ORAL | 0 refills | Status: AC

## 2023-01-09 ENCOUNTER — Ambulatory Visit: Payer: Medicare HMO | Admitting: Pediatrics

## 2023-01-12 ENCOUNTER — Ambulatory Visit (INDEPENDENT_AMBULATORY_CARE_PROVIDER_SITE_OTHER): Payer: Medicare HMO | Admitting: Pediatrics

## 2023-01-12 ENCOUNTER — Encounter: Payer: Self-pay | Admitting: Pediatrics

## 2023-01-12 VITALS — BP 112/71 | HR 60 | Temp 98.7°F | Resp 16 | Wt 158.4 lb

## 2023-01-12 DIAGNOSIS — I10 Essential (primary) hypertension: Secondary | ICD-10-CM | POA: Diagnosis not present

## 2023-01-12 DIAGNOSIS — N949 Unspecified condition associated with female genital organs and menstrual cycle: Secondary | ICD-10-CM | POA: Diagnosis not present

## 2023-01-12 DIAGNOSIS — R9389 Abnormal findings on diagnostic imaging of other specified body structures: Secondary | ICD-10-CM | POA: Diagnosis not present

## 2023-01-12 DIAGNOSIS — Z1330 Encounter for screening examination for mental health and behavioral disorders, unspecified: Secondary | ICD-10-CM | POA: Diagnosis not present

## 2023-01-12 DIAGNOSIS — I714 Abdominal aortic aneurysm, without rupture, unspecified: Secondary | ICD-10-CM | POA: Diagnosis not present

## 2023-01-12 MED ORDER — LISINOPRIL 10 MG PO TABS: 10.0000 mg | ORAL_TABLET | Freq: Every day | ORAL | 3 refills | Status: DC

## 2023-01-12 NOTE — Progress Notes (Signed)
Office Visit  BP 112/71 (BP Location: Left Arm, Patient Position: Sitting, Cuff Size: Normal)   Pulse 60   Temp 98.7 F (37.1 C) (Oral)   Resp 16   Wt 158 lb 6.4 oz (71.8 kg)   LMP  (LMP Unknown)   SpO2 96%   BMI 26.36 kg/m    Subjective:    Patient ID: Brandi Steele, female    DOB: 07-08-1955, 67 y.o.   MRN: [id number]  HPI: Brandi Steele is a 67 y.o. female  Chief Complaint  Patient presents with   Follow-up    Colonoscopy upcoming 02/18/2023, doesn't want to do it but will. Concerned about the prep and the amount of fluids she must take in. CT versus previous dopplar results and what's concerning    Ovarian Cyst    Left benign    Discussed the use of AI scribe software for clinical note transcription with the patient, who gave verbal consent to proceed.  History of Present Illness   The patient, with a history of vascular disease and a previous surgical intervention involving the aorta, presents with concerns about recent CT scan results. She reports a discrepancy between the size of her aorta as measured by CT scan and Doppler ultrasound, with the CT scan indicating a larger size.  The patient also reports a history of ovarian cysts, which have been noted on previous imaging studies. The most recent scan also showed an ovarian cyst, but the patient was not given a specific number of cysts.  She expresses anxiety about the procedure, particularly about the fluid intake required for the bowel prep, due to a previous hospitalization for fluid overload resulting in pulmonary edema and blood clots.  The patient also mentions occasional panic attacks, for which she has been prescribed Xanax. However, she reports rarely using the medication, having taken only three pills over several years. She has also seen a psychologist for help with these attacks.  The patient is currently dealing with some financial difficulties due to not receiving expected social security benefits.  She is planning to apply for Medicaid and is aware of the resources available to her, including a food pantry and case management team, should she need them.      Relevant past medical, surgical, family and social history reviewed and updated as indicated. Interim medical history since our last visit reviewed. Allergies and medications reviewed and updated.  ROS per HPI unless specifically indicated above     Objective:    BP 112/71 (BP Location: Left Arm, Patient Position: Sitting, Cuff Size: Normal)   Pulse 60   Temp 98.7 F (37.1 C) (Oral)   Resp 16   Wt 158 lb 6.4 oz (71.8 kg)   LMP  (LMP Unknown)   SpO2 96%   BMI 26.36 kg/m   Wt Readings from Last 3 Encounters:  01/12/23 158 lb 6.4 oz (71.8 kg)  01/08/23 158 lb 12.8 oz (72 kg)  12/05/22 159 lb 8 oz (72.3 kg)     Physical Exam Constitutional:      Appearance: Normal appearance.  HENT:     Head: Normocephalic and atraumatic.  Eyes:     Pupils: Pupils are equal, round, and reactive to light.  Cardiovascular:     Rate and Rhythm: Normal rate and regular rhythm.     Pulses: Normal pulses.     Heart sounds: Normal heart sounds.  Pulmonary:     Effort: Pulmonary effort is normal.     Breath  sounds: Normal breath sounds.  Musculoskeletal:        General: Normal range of motion.     Cervical back: Normal range of motion.  Skin:    General: Skin is warm and dry.     Capillary Refill: Capillary refill takes less than 2 seconds.  Neurological:     General: No focal deficit present.     Mental Status: She is alert. Mental status is at baseline.  Psychiatric:        Mood and Affect: Mood normal.        Behavior: Behavior normal.         01/12/2023    3:56 PM 11/06/2022   10:56 AM 10/28/2022   10:44 AM 10/09/2022    1:15 PM 08/25/2022    9:05 AM  Depression screen PHQ 2/9  Decreased Interest 0 0 0 0 0  Down, Depressed, Hopeless 0 0 0 0 0  PHQ - 2 Score 0 0 0 0 0  Altered sleeping 0 0 1 0 0  Tired, decreased  energy 0 0 0 0 0  Change in appetite 0 0 0 0 0  Feeling bad or failure about yourself  0 0 0 0 0  Trouble concentrating 0 0 0 0 0  Moving slowly or fidgety/restless 0 0 0 0 0  Suicidal thoughts 0 0 0 0 0  PHQ-9 Score 0 0 1 0 0  Difficult doing work/chores Not difficult at all Not difficult at all Not difficult at all Not difficult at all Not difficult at all       01/12/2023    3:56 PM 11/06/2022   10:57 AM 08/25/2022    9:05 AM 08/05/2022    8:54 AM  GAD 7 : Generalized Anxiety Score  Nervous, Anxious, on Edge 0 0 0 0  Control/stop worrying 0 0 0 0  Worry too much - different things 0 0 0 0  Trouble relaxing 0 0 0 0  Restless 0 0 0 0  Easily annoyed or irritable 0 0 0 0  Afraid - awful might happen 0 0 0 0  Total GAD 7 Score 0 0 0 0  Anxiety Difficulty Not difficult at all Not difficult at all Not difficult at all Not difficult at all       Assessment & Plan:  Assessment & Plan   Abdominal aortic aneurysm (AAA) without rupture, unspecified part (HCC) Assessment & Plan: Discrepancy in size measurements between CT scan (3.6 cm) and ultrasound (2.8 cm). Scheduled for vascular evaluation next month. Continue to follow recommendations.   Adnexal cyst Assessment & Plan: History of multiple ovarian cysts. Left adnexal cyst noted on recent CT, recommended for f/u imaging which we discussed today. -Plan for follow-up ultrasound in 6 months to monitor for any changes.   Essential hypertension Assessment & Plan: At goal, requesting refills today.  Orders: -     Lisinopril; Take 1 tablet (10 mg total) by mouth daily.  Dispense: 90 tablet; Refill: 3  Imaging abnormalities Discussed findings on recent CT and recommendations, see above.  Encounter for behavioral health screening As part of their intake evaluation, the patient was screened for depression, anxiety.  PHQ9 SCORE 0, GAD7 SCORE 0. Screening results negative for tested conditions. Continue to monitor.  Follow up  plan: Return if symptoms worsen or fail to improve.  Skila Rollins Howell Pringle, MD

## 2023-01-12 NOTE — Patient Instructions (Signed)
I'll see you in march.

## 2023-01-12 NOTE — Assessment & Plan Note (Signed)
Discrepancy in size measurements between CT scan (3.6 cm) and ultrasound (2.8 cm). Scheduled for vascular evaluation next month. Continue to follow recommendations.

## 2023-01-12 NOTE — Assessment & Plan Note (Signed)
At goal, requesting refills today.

## 2023-01-12 NOTE — Assessment & Plan Note (Signed)
History of multiple ovarian cysts. Left adnexal cyst noted on recent CT, recommended for f/u imaging which we discussed today. -Plan for follow-up ultrasound in 6 months to monitor for any changes.

## 2023-01-13 ENCOUNTER — Telehealth: Payer: Self-pay

## 2023-01-13 NOTE — Telephone Encounter (Signed)
Left message. Unable to get a fax for upcoming procedure to go through and requested a different fax number. Ok for [facility] to obtain

## 2023-01-26 ENCOUNTER — Other Ambulatory Visit (HOSPITAL_COMMUNITY): Payer: Self-pay

## 2023-01-26 ENCOUNTER — Telehealth: Payer: Self-pay | Admitting: Pharmacy Technician

## 2023-01-26 NOTE — Telephone Encounter (Signed)
Pharmacy Patient Advocate Encounter  Received notification from [employer] that Prior Authorization for repatha has been APPROVED from 01/26/23 to 01/27/23   PA #/Case ID/Reference #:  [id number]

## 2023-01-26 NOTE — Telephone Encounter (Signed)
Pharmacy Patient Advocate Encounter   Received notification from CoverMyMeds that prior authorization for repatha is required/requested.   Insurance verification completed.   The patient is insured through [employer] .   Per test claim: PA required; PA submitted to above mentioned insurance via CoverMyMeds Key/confirmation #/EOC [id number] Status is pending

## 2023-01-27 ENCOUNTER — Other Ambulatory Visit: Payer: Self-pay | Admitting: Nurse Practitioner

## 2023-01-29 ENCOUNTER — Other Ambulatory Visit (INDEPENDENT_AMBULATORY_CARE_PROVIDER_SITE_OTHER): Payer: Self-pay | Admitting: Nurse Practitioner

## 2023-01-29 DIAGNOSIS — G4733 Obstructive sleep apnea (adult) (pediatric): Secondary | ICD-10-CM | POA: Diagnosis not present

## 2023-01-29 DIAGNOSIS — I714 Abdominal aortic aneurysm, without rupture, unspecified: Secondary | ICD-10-CM

## 2023-01-30 ENCOUNTER — Other Ambulatory Visit (HOSPITAL_COMMUNITY): Payer: Self-pay

## 2023-01-31 NOTE — Telephone Encounter (Signed)
 Requested Prescriptions  Pending Prescriptions Disp Refills   clopidogrel  (PLAVIX ) 75 MG tablet [Pharmacy Med Name: CLOPIDOGREL  75MG  TABLETS] 90 tablet 0    Sig: TAKE 1 TABLET(75 MG) BY MOUTH DAILY     Hematology: Antiplatelets - clopidogrel  Passed - 01/31/2023  9:12 AM      Passed - HCT in normal range and within 180 days    HCT  Date Value Ref Range Status  12/16/2022 40.9 36.0 - 46.0 % Final   Hematocrit  Date Value Ref Range Status  10/09/2022 46.9 (H) 34.0 - 46.6 % Final         Passed - HGB in normal range and within 180 days    Hemoglobin  Date Value Ref Range Status  12/16/2022 13.1 12.0 - 15.0 g/dL Final  90/87/7975 85.2 11.1 - 15.9 g/dL Final         Passed - PLT in normal range and within 180 days    Platelets  Date Value Ref Range Status  12/16/2022 163 150 - 400 K/uL Final  10/09/2022 182 150 - 450 x10E3/uL Final         Passed - Cr in normal range and within 360 days    Creatinine  Date Value Ref Range Status  06/16/2013 0.63 0.60 - 1.30 mg/dL Final   Creatinine, Ser  Date Value Ref Range Status  12/16/2022 0.66 0.44 - 1.00 mg/dL Final         Passed - Valid encounter within last 6 months    Recent Outpatient Visits           2 weeks ago Abdominal aortic aneurysm (AAA) without rupture, unspecified part [facility])   [facility] Herold Hadassah SQUIBB, MD   2 months ago Annual physical exam   [facility] Herold Hadassah SQUIBB, MD   3 months ago Muscle cramp   [place] [facility] Herold Hadassah SQUIBB, MD   5 months ago Chronic congestive heart failure, unspecified heart failure type [facility])   [place] [facility] Mecum, Rocky BRAVO, PA-C   5 months ago Productive cough   [place] [facility] Mecum, Rocky BRAVO, PA-C       Future Appointments             In 2 months Herold, Hadassah SQUIBB, MD [facility], [facility]

## 2023-01-31 NOTE — Telephone Encounter (Signed)
 Requested Prescriptions  Pending Prescriptions Disp Refills   esomeprazole  (NEXIUM ) 40 MG capsule [Pharmacy Med Name: ESOMEPRAZOLE  MAGNESIUM  40MG  DR CAPS] 180 capsule 2    Sig: TAKE 1 CAPSULE BY MOUTH TWICE DAILY     Gastroenterology: Proton Pump Inhibitors 2 Passed - 01/31/2023  9:02 AM      Passed - ALT in normal range and within 360 days    ALT  Date Value Ref Range Status  12/16/2022 12 0 - 44 U/L Final   SGPT (ALT)  Date Value Ref Range Status  06/16/2013 17 12 - 78 U/L Final         Passed - AST in normal range and within 360 days    AST  Date Value Ref Range Status  12/16/2022 16 15 - 41 U/L Final   SGOT(AST)  Date Value Ref Range Status  06/16/2013 17 15 - 37 Unit/L Final         Passed - Valid encounter within last 12 months    Recent Outpatient Visits           2 weeks ago Abdominal aortic aneurysm (AAA) without rupture, unspecified part [facility])   [facility] Herold Hadassah SQUIBB, MD   2 months ago Annual physical exam   [place] [facility] Herold Hadassah SQUIBB, MD   3 months ago Muscle cramp   [facility] Herold Hadassah SQUIBB, MD   5 months ago Chronic congestive heart failure, unspecified heart failure type [facility])   [place] [facility] Mecum, Rocky BRAVO, PA-C   5 months ago Productive cough   [place] [facility] Mecum, Rocky BRAVO, PA-C       Future Appointments             In 2 months Herold, Hadassah SQUIBB, MD [facility], [facility]

## 2023-02-01 DIAGNOSIS — J449 Chronic obstructive pulmonary disease, unspecified: Secondary | ICD-10-CM | POA: Diagnosis not present

## 2023-02-03 ENCOUNTER — Ambulatory Visit (INDEPENDENT_AMBULATORY_CARE_PROVIDER_SITE_OTHER): Payer: Medicare HMO | Admitting: Nurse Practitioner

## 2023-02-03 ENCOUNTER — Ambulatory Visit (INDEPENDENT_AMBULATORY_CARE_PROVIDER_SITE_OTHER): Payer: Medicare HMO

## 2023-02-03 ENCOUNTER — Encounter (INDEPENDENT_AMBULATORY_CARE_PROVIDER_SITE_OTHER): Payer: Self-pay | Admitting: Nurse Practitioner

## 2023-02-03 VITALS — BP 140/62 | HR 59 | Resp 16 | Ht 65.5 in | Wt 156.2 lb

## 2023-02-03 DIAGNOSIS — I10 Essential (primary) hypertension: Secondary | ICD-10-CM | POA: Diagnosis not present

## 2023-02-03 DIAGNOSIS — I714 Abdominal aortic aneurysm, without rupture, unspecified: Secondary | ICD-10-CM

## 2023-02-03 DIAGNOSIS — E782 Mixed hyperlipidemia: Secondary | ICD-10-CM | POA: Diagnosis not present

## 2023-02-03 NOTE — Progress Notes (Signed)
 Subjective:    Patient ID: Roderick JINNY Derby, female    DOB: [birth year], 68 y.o.   MRN: [id number] Chief Complaint  Patient presents with   Follow-up    Consult for AAA has increase in size    The patient is a 68 year old female that returns today for follow-up evaluation of her abdominal aortic aneurysm.  She recently had a CT scan done on 12/16/2022 which showed an abdominal aneurysm measurement of 3.6 cm.  The patient has a history of previous intervention to her abdominal aorta.  She had an abdominal endarterectomy several years ago.  She has done fairly well postintervention and has recently lost a significant amount of weight by changing her diet.  About a year ago she had a measurement done on her abdominal aortic aneurysm measuring 2.8 cm.  There was concern that there may be significant change in the maximum diameter, however it was noted that the previous ultrasound was likely underestimated.    Review of Systems  Cardiovascular:  Negative for leg swelling.  Gastrointestinal:  Negative for abdominal pain.  All other systems reviewed and are negative.      Objective:   Physical Exam Vitals reviewed.  HENT:     Head: Normocephalic.  Cardiovascular:     Rate and Rhythm: Normal rate.  Pulmonary:     Effort: Pulmonary effort is normal.  Skin:    General: Skin is warm and dry.  Neurological:     Mental Status: She is alert and oriented to person, place, and time.  Psychiatric:        Mood and Affect: Mood normal.        Behavior: Behavior normal.        Thought Content: Thought content normal.        Judgment: Judgment normal.     BP (!) 140/62   Pulse (!) 59   Resp 16   Ht 5' 5.5 (1.664 m)   Wt 156 lb 3.2 oz (70.9 kg)   LMP  (LMP Unknown)   BMI 25.60 kg/m   Past Medical History:  Diagnosis Date   Anxiety    Aortic stenosis    Caregiver role strain 12/05/2020   CHF (congestive heart failure) (HCC)    COPD (chronic obstructive pulmonary disease) (HCC)     Depression    Emphysema of lung (HCC)    Fibromyalgia    GERD (gastroesophageal reflux disease)    Hyperlipidemia    Hypertension    Hypertensive heart disease    IFG (impaired fasting glucose) 09/07/2019   Myalgia    Obesity    Pulmonary embolism (HCC) 02/06/2022   Sciatica    Seborrheic keratosis    Stress incontinence     Social History   Socioeconomic History   Marital status: Widowed    Spouse name: Not on file   Number of children: 3   Years of education: Not on file   Highest education level: Not on file  Occupational History   Occupation: retired  Tobacco Use   Smoking status: Former    Current packs/day: 0.00    Average packs/day: 1 pack/day for 40.0 years (40.0 ttl pk-yrs)    Types: Cigarettes    Start date: 11/01/1976    Quit date: 11/01/2016    Years since quitting: 6.2   Smokeless tobacco: Never   Tobacco comments:    Is trying cold-turkey with social support  Vaping Use   Vaping status: Former   Start date: 12/02/2016  Quit date: 09/06/2021  Substance and Sexual Activity   Alcohol use: No    Alcohol/week: 0.0 standard drinks of alcohol   Drug use: No   Sexual activity: Not Currently  Other Topics Concern   Not on file  Social History Narrative   Husband passed away 10/06/22, 3 children, 1 passed from drug use   Social Drivers of Corporate Investment Banker Strain: Low Risk  (10/28/2022)   Overall Financial Resource Strain (CARDIA)    Difficulty of Paying Living Expenses: Not hard at all  Food Insecurity: No Food Insecurity (10/28/2022)   Hunger Vital Sign    Worried About Running Out of Food in the Last Year: Never true    Ran Out of Food in the Last Year: Never true  Transportation Needs: No Transportation Needs (10/28/2022)   PRAPARE - Administrator, Civil Service (Medical): No    Lack of Transportation (Non-Medical): No  Physical Activity: Insufficiently Active (10/28/2022)   Exercise Vital Sign    Days of Exercise per Week: 5 days     Minutes of Exercise per Session: 10 min  Stress: No Stress Concern Present (10/28/2022)   [employer] of Occupational Health - Occupational Stress Questionnaire    Feeling of Stress : Only a little  Social Connections: Moderately Isolated (10/28/2022)   Social Connection and Isolation Panel [NHANES]    Frequency of Communication with Friends and Family: More than three times a week    Frequency of Social Gatherings with Friends and Family: More than three times a week    Attends Religious Services: More than 4 times per year    Active Member of [employer] or Organizations: No    Attends Banker Meetings: Never    Marital Status: Widowed  Intimate Partner Violence: Not At Risk (10/28/2022)   Humiliation, Afraid, Rape, and Kick questionnaire    Fear of Current or Ex-Partner: No    Emotionally Abused: No    Physically Abused: No    Sexually Abused: No    Past Surgical History:  Procedure Laterality Date   RIGHT HEART CATH N/A 10/07/2021   Procedure: RIGHT HEART CATH;  Surgeon: Darron Deatrice LABOR, MD;  Location: [facility] INVASIVE CV LAB;  Service: Cardiovascular;  Laterality: N/A;   TONSILLECTOMY     TOTAL ABDOMINAL HYSTERECTOMY     partial    Family History  Problem Relation Age of Onset   Stroke Mother    Hypertension Mother    Heart disease Father    Diabetes Brother    Stroke Maternal Grandmother    Stroke Maternal Grandfather    Heart disease Paternal Grandmother    Heart disease Paternal Grandfather    Breast cancer Neg Hx     Allergies  Allergen Reactions   Morphine And Codeine  Nausea And Vomiting   Celexa [Citalopram Hydrobromide] Other (See Comments)    Pruritis    Effexor [Venlafaxine] Other (See Comments)    Panic attack   Lipitor [Atorvastatin ] Other (See Comments)    Myalgias   Shellfish Allergy Hives and Swelling   Wellbutrin [Bupropion] Anxiety       Latest Ref Rng & Units 12/16/2022    3:23 PM 10/09/2022    1:52 PM 05/08/2022   12:14 PM   CBC  WBC 4.0 - 10.5 K/uL 6.9  6.2  6.2   Hemoglobin 12.0 - 15.0 g/dL 86.8  85.2  86.1   Hematocrit 36.0 - 46.0 % 40.9  46.9  42.9  Platelets 150 - 400 K/uL 163  182  172       CMP     Component Value Date/Time   NA 137 12/16/2022 1523   NA 143 08/25/2022 0941   NA 143 06/16/2013 1946   K 4.3 12/16/2022 1523   K 4.0 06/16/2013 1946   CL 100 12/16/2022 1523   CL 103 06/16/2013 1946   CO2 27 12/16/2022 1523   CO2 31 06/16/2013 1946   GLUCOSE 96 12/16/2022 1523   GLUCOSE 93 06/16/2013 1946   BUN 16 12/16/2022 1523   BUN 32 (H) 08/25/2022 0941   BUN 13 06/16/2013 1946   CREATININE 0.66 12/16/2022 1523   CREATININE 0.63 06/16/2013 1946   CALCIUM  8.8 (L) 12/16/2022 1523   CALCIUM  9.0 06/16/2013 1946   PROT 6.8 12/16/2022 1523   PROT 6.8 08/25/2022 0941   PROT 7.5 06/16/2013 1946   ALBUMIN 4.0 12/16/2022 1523   ALBUMIN 4.6 08/25/2022 0941   ALBUMIN 3.8 06/16/2013 1946   AST 16 12/16/2022 1523   AST 17 06/16/2013 1946   ALT 12 12/16/2022 1523   ALT 17 06/16/2013 1946   ALKPHOS 58 12/16/2022 1523   ALKPHOS 80 06/16/2013 1946   BILITOT 0.4 12/16/2022 1523   BILITOT 0.2 08/25/2022 0941   BILITOT 0.3 06/16/2013 1946   EGFR 89 08/25/2022 0941   GFRNONAA >60 12/16/2022 1523   GFRNONAA >60 06/16/2013 1946     No results found.     Assessment & Plan:   1. Abdominal aortic aneurysm (AAA) without rupture, unspecified part (HCC) (Primary) Today the patient's abdominal aortic aneurysm measures 3.5 cm via ultrasound.  This is consistent with the CT scan that was done on 12/16/2022.  Previous ultrasound on 02/20/2022 showed an aorta measuring 2.8 cm however upon further review and evaluation of the previous ultrasound, and notes that that was underestimated.  Because of this we will not plan on any intervention for the aneurysm but instead of following up in 2 years we will plan on following this in 6 months to ensure there has not been any rapid growth.  2. Essential  hypertension Continue antihypertensive medications as already ordered, these medications have been reviewed and there are no changes at this time.  3. Mixed hyperlipidemia Continue statin as ordered and reviewed, no changes at this time   Current Outpatient Medications on File Prior to Visit  Medication Sig Dispense Refill   albuterol  (VENTOLIN  HFA) 108 (90 Base) MCG/ACT inhaler INHALE 2 PUFFS INTO THE LUNGS EVERY 6 HOURS AS NEEDED FOR WHEEZING OR SHORTNESS OF BREATH 18 g 1   ALPRAZolam  (XANAX ) 0.5 MG tablet Take 1 tablet (0.5 mg total) by mouth 3 (three) times daily as needed for anxiety. 30 tablet 0   budesonide -formoterol  (SYMBICORT ) 160-4.5 MCG/ACT inhaler INHALE 2 PUFFS INTO THE LUNGS TWICE DAILY 10.2 g 5   Calcium  Carbonate-Vit D-Min (CALCIUM  1200 PO) Take by mouth daily.     cetirizine  (ZYRTEC ) 10 MG tablet Take 1 tablet (10 mg total) by mouth daily. (Patient taking differently: Take 10 mg by mouth as needed.) 30 tablet 11   Chlorpheniramine-DM (DIMETAPP LONG ACT COUGH/COLD) 1-7.5 MG/5ML SYRP Take 10 mLs by mouth every 6 (six) hours as needed. 118 mL 0   clopidogrel  (PLAVIX ) 75 MG tablet TAKE 1 TABLET(75 MG) BY MOUTH DAILY 90 tablet 0   Coenzyme Q10 (CO Q 10 PO) Take 2 capsules by mouth every morning.     empagliflozin  (JARDIANCE ) 10 MG TABS tablet TAKE 1 TABLET(10  MG) BY MOUTH DAILY BEFORE BREAKFAST 30 tablet 11   esomeprazole  (NEXIUM ) 40 MG capsule TAKE 1 CAPSULE BY MOUTH TWICE DAILY 180 capsule 2   Evolocumab  (REPATHA  SURECLICK) 140 MG/ML SOAJ Inject 140 mg into the skin every 14 (fourteen) days. 6 mL 3   Folic Acid (FOLATE PO) Take 1 tablet by mouth daily.     furosemide  (LASIX ) 40 MG tablet TAKE 1 TABLET(40 MG) BY MOUTH DAILY AS NEEDED 30 tablet 2   hydrOXYzine  (ATARAX ) 25 MG tablet TAKE 1 TABLET(25 MG) BY MOUTH THREE TIMES DAILY AS NEEDED (Patient taking differently: No sig reported) 90 tablet 2   lisinopril  (ZESTRIL ) 10 MG tablet Take 1 tablet (10 mg total) by mouth daily. 90  tablet 3   Magnesium  400 MG CAPS Take 550 mg by mouth. 2 in the AM and 2 in the PM     meloxicam  (MOBIC ) 15 MG tablet TAKE 1 TABLET(15 MG) BY MOUTH DAILY 90 tablet 3   nystatin  ointment (MYCOSTATIN ) Apply 1 Application topically 2 (two) times daily. 30 g 0   Potassium Gluconate 595 MG CAPS Take 400 mg by mouth in the morning, at noon, in the evening, and at bedtime. Take 4 capsules daily     triamcinolone  cream (KENALOG ) 0.1 % Apply 1 application. topically 2 (two) times daily. 80 g 1   Current Facility-Administered Medications on File Prior to Visit  Medication Dose Route Frequency Provider Last Rate Last Admin   albuterol  (PROVENTIL ) (2.5 MG/3ML) 0.083% nebulizer solution 2.5 mg  2.5 mg Nebulization Once Bensimhon, Toribio SAUNDERS, MD        There are no Patient Instructions on file for this visit. No follow-ups on file.   Scottie Stanish E Alexei Ey, NP

## 2023-02-04 ENCOUNTER — Encounter (INDEPENDENT_AMBULATORY_CARE_PROVIDER_SITE_OTHER): Payer: Self-pay | Admitting: Nurse Practitioner

## 2023-02-05 ENCOUNTER — Other Ambulatory Visit: Payer: Self-pay

## 2023-02-05 MED ORDER — OMEPRAZOLE 40 MG PO CPDR: 40.0000 mg | DELAYED_RELEASE_CAPSULE | Freq: Every day | ORAL | 3 refills | Status: DC

## 2023-02-16 ENCOUNTER — Telehealth: Payer: Self-pay

## 2023-02-16 NOTE — Telephone Encounter (Signed)
Patient left a voicemail and states she has some questions about her upcoming colonoscopy tomorrow.

## 2023-02-16 NOTE — Telephone Encounter (Signed)
The patient called back to speak to the nurse.

## 2023-02-16 NOTE — Telephone Encounter (Signed)
The patient called in because she said she accidentally put nondairy cream in her coffee. The patient said that she only had one cup.

## 2023-02-16 NOTE — Telephone Encounter (Signed)
Spoke with patient-she stated she forgot and put a very small amount of dairy creamer in her coffee- I let her know it should be fine- to go ahead and follow through with liquid diet today and until after her colonoscopy-we discussed beginning her Suprep prep today at 5 and push plenty of liquids and to not have solid foods.

## 2023-02-17 ENCOUNTER — Ambulatory Visit: Payer: Medicare HMO | Admitting: Certified Registered Nurse Anesthetist

## 2023-02-17 ENCOUNTER — Encounter
Admission: RE | Disposition: A | Discharge status: Home / Self Care | Payer: Self-pay | Source: Home / Self Care | Attending: Gastroenterology

## 2023-02-17 ENCOUNTER — Encounter: Payer: Self-pay | Admitting: Gastroenterology

## 2023-02-17 ENCOUNTER — Ambulatory Visit
Admission: RE | Admit: 2023-02-17 | Discharge: 2023-02-17 | Disposition: A | Discharge status: Home / Self Care | Payer: Medicare HMO | Attending: Gastroenterology | Admitting: Gastroenterology

## 2023-02-17 DIAGNOSIS — K640 First degree hemorrhoids: Secondary | ICD-10-CM | POA: Diagnosis not present

## 2023-02-17 DIAGNOSIS — I2510 Atherosclerotic heart disease of native coronary artery without angina pectoris: Secondary | ICD-10-CM | POA: Diagnosis not present

## 2023-02-17 DIAGNOSIS — M797 Fibromyalgia: Secondary | ICD-10-CM | POA: Diagnosis not present

## 2023-02-17 DIAGNOSIS — F419 Anxiety disorder, unspecified: Secondary | ICD-10-CM | POA: Insufficient documentation

## 2023-02-17 DIAGNOSIS — K219 Gastro-esophageal reflux disease without esophagitis: Secondary | ICD-10-CM | POA: Diagnosis not present

## 2023-02-17 DIAGNOSIS — I739 Peripheral vascular disease, unspecified: Secondary | ICD-10-CM | POA: Insufficient documentation

## 2023-02-17 DIAGNOSIS — R195 Other fecal abnormalities: Secondary | ICD-10-CM

## 2023-02-17 DIAGNOSIS — I509 Heart failure, unspecified: Secondary | ICD-10-CM | POA: Diagnosis not present

## 2023-02-17 DIAGNOSIS — Z1211 Encounter for screening for malignant neoplasm of colon: Secondary | ICD-10-CM

## 2023-02-17 DIAGNOSIS — Z87891 Personal history of nicotine dependence: Secondary | ICD-10-CM | POA: Insufficient documentation

## 2023-02-17 DIAGNOSIS — K649 Unspecified hemorrhoids: Secondary | ICD-10-CM | POA: Diagnosis not present

## 2023-02-17 DIAGNOSIS — J439 Emphysema, unspecified: Secondary | ICD-10-CM | POA: Insufficient documentation

## 2023-02-17 DIAGNOSIS — I110 Hypertensive heart disease with heart failure: Secondary | ICD-10-CM | POA: Insufficient documentation

## 2023-02-17 DIAGNOSIS — R634 Abnormal weight loss: Secondary | ICD-10-CM

## 2023-02-17 HISTORY — DX: Sleep apnea, unspecified: G47.30

## 2023-02-17 HISTORY — DX: Aortic aneurysm of unspecified site, without rupture: I71.9

## 2023-02-17 HISTORY — PX: COLONOSCOPY WITH PROPOFOL: SHX5780

## 2023-02-17 SURGERY — COLONOSCOPY WITH PROPOFOL
Anesthesia: General

## 2023-02-17 MED ORDER — GLYCOPYRROLATE 0.2 MG/ML IJ SOLN
INTRAMUSCULAR | Status: DC | PRN
  Administered 2023-02-17 (×2): .1 mg via INTRAVENOUS

## 2023-02-17 MED ORDER — LIDOCAINE HCL (CARDIAC) PF 100 MG/5ML IV SOSY
PREFILLED_SYRINGE | INTRAVENOUS | Status: DC | PRN
  Administered 2023-02-17: 60 mg via INTRAVENOUS

## 2023-02-17 MED ORDER — EPHEDRINE SULFATE-NACL 50-0.9 MG/10ML-% IV SOSY
PREFILLED_SYRINGE | INTRAVENOUS | Status: DC | PRN
  Administered 2023-02-17 (×2): 5 mg via INTRAVENOUS

## 2023-02-17 MED ORDER — PROPOFOL 10 MG/ML IV BOLUS
INTRAVENOUS | Status: DC | PRN
  Administered 2023-02-17: 30 mg via INTRAVENOUS

## 2023-02-17 MED ORDER — SODIUM CHLORIDE 0.9 % IV SOLN: INTRAVENOUS | Status: DC

## 2023-02-17 MED ORDER — EPHEDRINE 5 MG/ML INJ
INTRAVENOUS | Status: AC
  Filled 2023-02-17: qty 5

## 2023-02-17 MED ORDER — PROPOFOL 500 MG/50ML IV EMUL
INTRAVENOUS | Status: DC | PRN
  Administered 2023-02-17: 120 ug/kg/min via INTRAVENOUS

## 2023-02-17 NOTE — Anesthesia Procedure Notes (Signed)
Date/Time: 02/17/2023 10:25 AM  Performed by: Malva Cogan, CRNAPre-anesthesia Checklist: Patient identified, Emergency Drugs available, Suction available, Patient being monitored and Timeout performed Patient Re-evaluated:Patient Re-evaluated prior to induction Oxygen Delivery Method: Nasal cannula Induction Type: IV induction Placement Confirmation: CO2 detector and positive ETCO2

## 2023-02-17 NOTE — H&P (Signed)
Wyline Mood, MD [address], [place], [place], [address] [address], [address], [place], [place], [address] Phone: [phone]  Fax: [phone]  Primary Care Physician:  Jackolyn Confer, MD   Pre-Procedure History & Physical: HPI:  Brandi Steele is a 68 y.o. female is here for an colonoscopy.   Past Medical History:  Diagnosis Date   Anxiety    Aortic stenosis    Caregiver role strain 12/05/2020   CHF (congestive heart failure) (HCC)    COPD (chronic obstructive pulmonary disease) (HCC)    Depression    Emphysema of lung (HCC)    Fibromyalgia    GERD (gastroesophageal reflux disease)    Hyperlipidemia    Hypertension    Hypertensive heart disease    IFG (impaired fasting glucose) 09/07/2019   Myalgia    Obesity    Pulmonary embolism (HCC) 02/06/2022   Sciatica    Seborrheic keratosis    Stress incontinence     Past Surgical History:  Procedure Laterality Date   RIGHT HEART CATH N/A 10/07/2021   Procedure: RIGHT HEART CATH;  Surgeon: Iran Ouch, MD;  Location: [facility] INVASIVE CV LAB;  Service: Cardiovascular;  Laterality: N/A;   TONSILLECTOMY     TOTAL ABDOMINAL HYSTERECTOMY     partial    Prior to Admission medications   Medication Sig Start Date End Date Taking? Authorizing Provider  albuterol (VENTOLIN HFA) 108 (90 Base) MCG/ACT inhaler INHALE 2 PUFFS INTO THE LUNGS EVERY 6 HOURS AS NEEDED FOR WHEEZING OR SHORTNESS OF BREATH 05/25/20   Larae Grooms, NP  ALPRAZolam Prudy Feeler) 0.5 MG tablet Take 1 tablet (0.5 mg total) by mouth 3 (three) times daily as needed for anxiety. 08/25/22   Mecum, Erin E, PA-C  budesonide-formoterol (SYMBICORT) 160-4.5 MCG/ACT inhaler INHALE 2 PUFFS INTO THE LUNGS TWICE DAILY 11/18/22   Jackolyn Confer, MD  Calcium Carbonate-Vit D-Min (CALCIUM 1200 PO) Take by mouth daily.    [provider]  cetirizine (ZYRTEC) 10 MG tablet Take 1 tablet (10 mg total) by mouth daily. Patient taking differently: Take 10  mg by mouth as needed. 03/03/19   Particia Nearing, PA-C  Chlorpheniramine-DM (DIMETAPP LONG ACT COUGH/COLD) 1-7.5 MG/5ML SYRP Take 10 mLs by mouth every 6 (six) hours as needed. 08/12/22   Mecum, Erin E, PA-C  clopidogrel (PLAVIX) 75 MG tablet TAKE 1 TABLET(75 MG) BY MOUTH DAILY 01/31/23   Jackolyn Confer, MD  Coenzyme Q10 (CO Q 10 PO) Take 2 capsules by mouth every morning.    [provider]  empagliflozin (JARDIANCE) 10 MG TABS tablet TAKE 1 TABLET(10 MG) BY MOUTH DAILY BEFORE BREAKFAST 07/09/22   Sabharwal, Aditya, DO  Evolocumab (REPATHA SURECLICK) 140 MG/ML SOAJ Inject 140 mg into the skin every 14 (fourteen) days. 06/25/22   Debbe Odea, MD  Folic Acid (FOLATE PO) Take 1 tablet by mouth daily.    [provider]  furosemide (LASIX) 40 MG tablet TAKE 1 TABLET(40 MG) BY MOUTH DAILY AS NEEDED 10/31/22   Jackolyn Confer, MD  hydrOXYzine (ATARAX) 25 MG tablet TAKE 1 TABLET(25 MG) BY MOUTH THREE TIMES DAILY AS NEEDED Patient taking differently: No sig reported 01/30/21   Larae Grooms, NP  lisinopril (ZESTRIL) 10 MG tablet Take 1 tablet (10 mg total) by mouth daily. 01/12/23 01/12/24  Jackolyn Confer, MD  Magnesium 400 MG CAPS Take 550 mg by mouth. 2 in the AM and 2 in the PM    [provider]  meloxicam (MOBIC) 15 MG tablet TAKE 1 TABLET(15 MG) BY MOUTH DAILY 02/05/22   Larae Grooms, NP  nystatin ointment (MYCOSTATIN) Apply 1 Application topically 2 (two) times daily. 11/12/22   Jackolyn Confer, MD  omeprazole (PRILOSEC) 40 MG capsule Take 1 capsule (40 mg total) by mouth daily. 02/05/23   Jackolyn Confer, MD  Potassium Gluconate 595 MG CAPS Take 400 mg by mouth in the morning, at noon, in the evening, and at bedtime. Take 4 capsules daily    [provider]  triamcinolone cream (KENALOG) 0.1 % Apply 1 application. topically 2 (two) times daily. 04/22/21   Larae Grooms, NP    Allergies as of 01/08/2023 - Review Complete 01/08/2023  Allergen  Reaction Noted   Morphine and codeine Nausea And Vomiting 01/07/2015   Celexa [citalopram hydrobromide] Other (See Comments) 01/05/2015   Effexor [venlafaxine] Other (See Comments) 01/05/2015   Lipitor [atorvastatin] Other (See Comments) 07/18/2019   Shellfish allergy Hives and Swelling 01/07/2015   Wellbutrin [bupropion] Anxiety 01/05/2015    Family History  Problem Relation Age of Onset   Stroke Mother    Hypertension Mother    Heart disease Father    Diabetes Brother    Stroke Maternal Grandmother    Stroke Maternal Grandfather    Heart disease Paternal Grandmother    Heart disease Paternal Grandfather    Breast cancer Neg Hx     Social History   Socioeconomic History   Marital status: Widowed    Spouse name: Not on file   Number of children: 3   Years of education: Not on file   Highest education level: Not on file  Occupational History   Occupation: retired  Tobacco Use   Smoking status: Former    Current packs/day: 0.00    Average packs/day: 1 pack/day for 40.0 years (40.0 ttl pk-yrs)    Types: Cigarettes    Start date: 11/01/1976    Quit date: 11/01/2016    Years since quitting: 6.2   Smokeless tobacco: Never   Tobacco comments:    Is trying cold-turkey with social support  Vaping Use   Vaping status: Former   Start date: 12/02/2016   Quit date: 09/06/2021  Substance and Sexual Activity   Alcohol use: No    Alcohol/week: 0.0 standard drinks of alcohol   Drug use: No   Sexual activity: Not Currently  Other Topics Concern   Not on file  Social History Narrative   Husband passed away 2022/10/08, 3 children, 1 passed from drug use   Social Drivers of Corporate investment banker Strain: Low Risk  (10/28/2022)   Overall Financial Resource Strain (CARDIA)    Difficulty of Paying Living Expenses: Not hard at all  Food Insecurity: No Food Insecurity (10/28/2022)   Hunger Vital Sign    Worried About Running Out of Food in the Last Year: Never true    Ran Out of  Food in the Last Year: Never true  Transportation Needs: No Transportation Needs (10/28/2022)   PRAPARE - Administrator, Civil Service (Medical): No    Lack of Transportation (Non-Medical): No  Physical Activity: Insufficiently Active (10/28/2022)   Exercise Vital Sign    Days of Exercise per Week: 5 days    Minutes of Exercise per Session: 10 min  Stress: No Stress Concern Present (10/28/2022)   [employer] of Occupational Health - Occupational Stress Questionnaire    Feeling of Stress : Only a little  Social Connections: Moderately Isolated (10/28/2022)   Social Connection and Isolation Panel [NHANES]    Frequency of Communication with Friends and Family: More than three times a week    Frequency of Social Gatherings with Friends and Family: More than three times a week    Attends Religious Services: More than 4 times per year    Active Member of [employer] or Organizations: No    Attends Banker Meetings: Never    Marital Status: Widowed  Intimate Partner Violence: Not At Risk (10/28/2022)   Humiliation, Afraid, Rape, and Kick questionnaire    Fear of Current or Ex-Partner: No    Emotionally Abused: No    Physically Abused: No    Sexually Abused: No    Review of Systems: See HPI, otherwise negative ROS  Physical Exam: LMP  (LMP Unknown)  General:   Alert,  pleasant and cooperative in NAD Head:  Normocephalic and atraumatic. Neck:  Supple; no masses or thyromegaly. Lungs:  Clear throughout to auscultation, normal respiratory effort.    Heart:  +S1, +S2, Regular rate and rhythm, No edema. Abdomen:  Soft, nontender and nondistended. Normal bowel sounds, without guarding, and without rebound.   Neurologic:  Alert and  oriented x4;  grossly normal neurologically.  Impression/Plan: TAHJANAE Steele is here for an colonoscopy to be performed for positive cologuard.   Risks, benefits, limitations, and alternatives regarding  colonoscopy have been reviewed  with the patient.  Questions have been answered.  All parties agreeable.   Wyline Mood, MD  02/17/2023, 9:26 AM

## 2023-02-17 NOTE — Anesthesia Postprocedure Evaluation (Signed)
Anesthesia Post Note  Patient: Brandi Steele  Procedure(s) Performed: COLONOSCOPY WITH PROPOFOL  Patient location during evaluation: Endoscopy Anesthesia Type: General Level of consciousness: awake and alert Pain management: pain level controlled Vital Signs Assessment: post-procedure vital signs reviewed and stable Respiratory status: spontaneous breathing, nonlabored ventilation, respiratory function stable and patient connected to nasal cannula oxygen Cardiovascular status: blood pressure returned to baseline and stable Postop Assessment: no apparent nausea or vomiting Anesthetic complications: no   No notable events documented.   Last Vitals:  Vitals:   02/17/23 1040 02/17/23 1050  BP: (!) 98/41 (!) 117/48  Pulse: 79 76  Resp: (!) 22 19  Temp: (!) 36.2 C   SpO2: 98% 100%    Last Pain:  Vitals:   02/17/23 1050  TempSrc:   PainSc: 0-No pain                 Louie Boston

## 2023-02-17 NOTE — Op Note (Signed)
[facility] Patient Name: Brandi Steele Procedure Date: 02/17/2023 10:14 AM MRN: [id number] Account #: [id number] Date of Birth: [birth year] Admit Type: Outpatient Age: 68 Room: [facility] ENDO ROOM 2 Gender: Female Note Status: Finalized Instrument Name: Prentice Docker [id number] Procedure:             Colonoscopy Indications:           Screening for colorectal malignant neoplasm due to                         positive Cologuard test Providers:             Wyline Mood MD, MD Medicines:             Monitored Anesthesia Care Complications:         No immediate complications. Procedure:             Pre-Anesthesia Assessment:                        - Prior to the procedure, a History and Physical was                         performed, and patient medications, allergies and                         sensitivities were reviewed. [name] tolerance                         of previous anesthesia was reviewed.                        - The risks and benefits of the procedure and the                         sedation options and risks were discussed with the                         patient. All questions were answered and informed                         consent was obtained.                        - ASA Grade Assessment: II - A patient with mild                         systemic disease.                        After obtaining informed consent, the colonoscope was                         passed under direct vision. Throughout the procedure,                         the patient's blood pressure, pulse, and oxygen                         saturations were monitored continuously. The  Colonoscope was introduced through the anus and                         advanced to the the cecum, identified by the                         appendiceal orifice. The colonoscopy was performed                         with ease. The patient tolerated the procedure well.                          The quality of the bowel preparation was excellent.                         The ileocecal valve, appendiceal orifice, and rectum                         were photographed. Findings:      Non-bleeding internal hemorrhoids were found during retroflexion. The       hemorrhoids were medium-sized and Grade I (internal hemorrhoids that do       not prolapse).      The exam was otherwise without abnormality on direct and retroflexion       views. Impression:            - Non-bleeding internal hemorrhoids.                        - The examination was otherwise normal on direct and                         retroflexion views.                        - No specimens collected. Recommendation:        - Discharge patient to home (with escort).                        - Resume previous diet.                        - Continue present medications. Procedure Code(s):     --- Professional ---                        [phone], Colonoscopy, flexible; diagnostic, including                         collection of specimen(s) by brushing or washing, when                         performed (separate procedure) Diagnosis Code(s):     --- Professional ---                        Z12.11, Encounter for screening for malignant neoplasm                         of colon  R19.5, Other fecal abnormalities                        K64.0, First degree hemorrhoids CPT copyright 2022 American [employer]. All rights reserved. The codes documented in this report are preliminary and upon coder review may  be revised to meet current compliance requirements. Wyline Mood, MD Wyline Mood MD, MD 02/17/2023 10:39:18 AM This report has been signed electronically. Number of Addenda: 0 Note Initiated On: 02/17/2023 10:14 AM Scope Withdrawal Time: 0 hours 9 minutes 9 seconds  Total Procedure Duration: 0 hours 11 minutes 12 seconds  Estimated Blood Loss:  Estimated blood loss: none.       [facility]

## 2023-02-17 NOTE — Anesthesia Preprocedure Evaluation (Addendum)
Anesthesia Evaluation  Patient identified by MRN, date of birth, ID band Patient awake    Reviewed: Allergy & Precautions, NPO status , Patient's Chart, lab work & pertinent test results  History of Anesthesia Complications Negative for: history of anesthetic complications  Airway Mallampati: II  TM Distance: >3 FB Neck ROM: full    Dental  (+) Upper Dentures   Pulmonary sleep apnea and Continuous Positive Airway Pressure Ventilation , COPD,  COPD inhaler and oxygen dependent, former smoker   Pulmonary exam normal        Cardiovascular hypertension, On Medications pulmonary hypertension+ CAD, + Peripheral Vascular Disease and +CHF  Normal cardiovascular exam  AAA   Neuro/Psych  PSYCHIATRIC DISORDERS Anxiety Depression     Neuromuscular disease    GI/Hepatic Neg liver ROS,GERD  ,,  Endo/Other  negative endocrine ROS    Renal/GU negative Renal ROS  negative genitourinary   Musculoskeletal  (+)  Fibromyalgia -  Abdominal   Peds  Hematology negative hematology ROS (+)   Anesthesia Other Findings Past Medical History: No date: Anxiety No date: Aortic stenosis 12/05/2020: Caregiver role strain No date: CHF (congestive heart failure) (HCC) No date: COPD (chronic obstructive pulmonary disease) (HCC) No date: Depression No date: Emphysema of lung (HCC) No date: Fibromyalgia No date: GERD (gastroesophageal reflux disease) No date: Hyperlipidemia No date: Hypertension No date: Hypertensive heart disease 09/07/2019: IFG (impaired fasting glucose) No date: Myalgia No date: Obesity 02/06/2022: Pulmonary embolism (HCC) No date: Sciatica No date: Seborrheic keratosis No date: Stress incontinence  Past Surgical History: 10/07/2021: RIGHT HEART CATH; N/A     Comment:  Procedure: RIGHT HEART CATH;  Surgeon: Iran Ouch, MD;  Location: [facility] INVASIVE CV LAB;  Service:               Cardiovascular;   Laterality: N/A; No date: TONSILLECTOMY No date: TOTAL ABDOMINAL HYSTERECTOMY     Comment:  partial  BMI    Body Mass Index: 25.29 kg/m      Reproductive/Obstetrics negative OB ROS                             Anesthesia Physical Anesthesia Plan  ASA: 4  Anesthesia Plan: General   Post-op Pain Management: Minimal or no pain anticipated   Induction: Intravenous  PONV Risk Score and Plan: 2 and Propofol infusion and TIVA  Airway Management Planned: Natural Airway and Nasal Cannula  Additional Equipment:   Intra-op Plan:   Post-operative Plan:   Informed Consent: I have reviewed the patients History and Physical, chart, labs and discussed the procedure including the risks, benefits and alternatives for the proposed anesthesia with the patient or authorized representative who has indicated his/her understanding and acceptance.     Dental Advisory Given  Plan Discussed with: Anesthesiologist, CRNA and Surgeon  Anesthesia Plan Comments: (Patient consented for risks of anesthesia including but not limited to:  - adverse reactions to medications - risk of airway placement if required - damage to eyes, teeth, lips or other oral mucosa - nerve damage due to positioning  - sore throat or hoarseness - Damage to heart, brain, nerves, lungs, other parts of body or loss of life  Patient voiced understanding and assent.)        Anesthesia Quick Evaluation

## 2023-02-17 NOTE — Transfer of Care (Signed)
Immediate Anesthesia Transfer of Care Note  Patient: Brandi Steele  Procedure(s) Performed: COLONOSCOPY WITH PROPOFOL  Patient Location: PACU  Anesthesia Type:General  Level of Consciousness: awake, alert , and oriented  Airway & Oxygen Therapy: Patient Spontanous Breathing  Post-op Assessment: Report given to RN and Post -op Vital signs reviewed and stable  Post vital signs: Reviewed and stable  Last Vitals:  Vitals Value Taken Time  BP    Temp    Pulse    Resp    SpO2      Last Pain:  Vitals:   02/17/23 0950  TempSrc: Temporal  PainSc: 0-No pain         Complications: No notable events documented.

## 2023-02-18 ENCOUNTER — Encounter: Payer: Self-pay | Admitting: Gastroenterology

## 2023-03-05 ENCOUNTER — Encounter: Payer: Self-pay | Admitting: Cardiology

## 2023-03-05 ENCOUNTER — Ambulatory Visit: Payer: 59 | Attending: Cardiology | Admitting: Cardiology

## 2023-03-05 VITALS — BP 130/70 | HR 59 | Ht 65.0 in | Wt 155.0 lb

## 2023-03-05 DIAGNOSIS — I10 Essential (primary) hypertension: Secondary | ICD-10-CM

## 2023-03-05 DIAGNOSIS — I5032 Chronic diastolic (congestive) heart failure: Secondary | ICD-10-CM | POA: Diagnosis not present

## 2023-03-05 DIAGNOSIS — I272 Pulmonary hypertension, unspecified: Secondary | ICD-10-CM

## 2023-03-05 DIAGNOSIS — I2510 Atherosclerotic heart disease of native coronary artery without angina pectoris: Secondary | ICD-10-CM | POA: Diagnosis not present

## 2023-03-05 DIAGNOSIS — Z86711 Personal history of pulmonary embolism: Secondary | ICD-10-CM

## 2023-03-05 DIAGNOSIS — I452 Bifascicular block: Secondary | ICD-10-CM

## 2023-03-05 DIAGNOSIS — E785 Hyperlipidemia, unspecified: Secondary | ICD-10-CM

## 2023-03-05 NOTE — Progress Notes (Addendum)
 Cardiology Office Note:  .   Date:  03/05/2023  ID:  Brandi Steele, DOB [birth year], MRN [id number] PCP: Herold Hadassah SQUIBB, MD  [facility] Providers Cardiologist:  Redell Cave, MD    History of Present Illness: .   Brandi Steele is a 68 y.o. female with past medical history of chronic HFpEF, hypertension, coronary artery disease noted on CT imaging, AAA repair approximately 20 years prior, carotid artery stenosis, pulmonary hypertension, emphysema, chronic hypoxic respiratory failure on supplemental oxygen , OSA on CPAP, GERD, fibromyalgia, and hyperlipidemia, who is here today for follow-up on her coronary artery disease.  Prior echocardiogram completed in 2015 demonstrated biatrial enlargement with enlarged RV and normal RV systolic function.  Echocardiogram was similar to 2018.  RVSP not commented on.  Echocardiogram completed at [facility] in 03/2021 demonstrating EF greater than 55% with moderate tricuspid regurgitation and severe pulmonary hypertension with PASP 74 mmHg.  Treadmill stress testing in 03/2021 showed no ischemic EKG changes.  She establish care with our care after previously being managed by [facility] clinic in June 2023.  At that time she was doing well from a cardiac perspective.  Updated echocardiogram revealed an LVEF of 60 to 65%, G2 DD, severely elevated PASP at 72.4 mmHg, biatrial dilatation.  She followed up with pulmonary shortly after with symptoms related to pulmonary hypertension underwent a right heart catheterization which revealed moderate pulmonary hypertension and mixed atrial and venous etiology.  She was admitted to the hospital 02/02/2022 - 02/07/2022 for acute on chronic diastolic heart failure with minimally elevated BNP, chest x-ray showed diffuse interstitial opacities suggesting edema.  She was diuresed with IV Lasix .  On 1/11 she began requiring increased oxygen  demands, D-dimer was elevated, CTA showed multiple tiny defects consistent with pulmonary  embolism.  Lower extremity duplex ultrasound was negative for DVTs bilaterally.  She was started on IV heparin  and then transition to apixaban  at discharge.  Clopidogrel  was held to minimize bleeding risk.  Echocardiogram revealed an LVEF of 60 to 65%, mild LVH.  Carotid ultrasound 02/15/2022 revealed bilateral minimal stenosis.  She was last seen by advanced heart failure clinic 09/25/2022 by Dr. Cherrie.  Breathing was much better, fluid was well-controlled, taking Jardiance , trying to stay, was primary caregiver for her husband.  And had lost nearly 20 pounds.  Echocardiogram was to be repeated within 6 months.  There were no other changes made to her medications or further testing that was required at that time.  She returns to clinic today stating that overall she has been doing well.  She is continue to work on weight loss with her dietary changes.  Denies any chest pain, worsening shortness of breath, peripheral edema, lightheadedness or dizziness.  She recently underwent her colonoscopy and was found that she had hemorrhoid but no other findings.  She has tolerated her medications without any adverse side effects.  She is no longer on blood thinners but continues on clopidogrel .  States that she is continue to follow with Dr. Bensimhon for pulmonary hypertension and with vascular.  Denies any hospitalizations or visits to the emergency department.  ROS: 10 point review of systems has been reviewed and considered negative except what is been listed in HPI  Studies Reviewed: SABRA   EKG Interpretation Date/Time:  Thursday March 05 2023 09:26:37 EST Ventricular Rate:  59 PR Interval:  172 QRS Duration:  136 QT Interval:  430 QTC Calculation: 425 R Axis:   -88  Text Interpretation: Sinus bradycardia Right bundle branch  block Left anterior fascicular block Bifascicular block When compared with ECG of 02-Feb-2022 13:51, Confirmed by Gerard Frederick ([id number]) on 03/05/2023 1:45:47 PM    2D echo  02/06/2022 1. Left ventricular ejection fraction, by estimation, is 60 to 65%. The  left ventricle has normal function. The left ventricle has no regional  wall motion abnormalities. There is mild left ventricular hypertrophy.  Left ventricular diastolic parameters  are indeterminate.   2. Right ventricular systolic function is low normal. The right  ventricular size is mildly enlarged. Tricuspid regurgitation signal is  inadequate for assessing PA pressure.   3. The mitral valve is normal in structure. No evidence of mitral valve  regurgitation. No evidence of mitral stenosis.   4. The aortic valve is normal in structure. Aortic valve regurgitation is  not visualized. Aortic valve sclerosis is present, with no evidence of  aortic valve stenosis.   5. The inferior vena cava is normal in size with greater than 50%  respiratory variability, suggesting right atrial pressure of 3 mmHg.   [facility] 10/07/2021 Successful right heart catheterization via the right antecubital vein.   Normal RA pressure, mildly elevated wedge pressure, moderate pulmonary hypertension and normal cardiac output.   RA: 5 mmHg RV: 52/1 mmHg PCW: 18 mmHg PA: 56/24 with a mean of 35 mmHg.  Pulmonary vascular resistance: 2.56 Woods units PA sat: 68% Aortic sat: 92% Cardiac output: 6.63 with an index of 3.45.   Recommendations: Pulmonary hypertension is moderate and seems to be of a mixed arterial and venous etiology.    2D echo 08/13/2021 1. Left ventricular ejection fraction, by estimation, is 60 to 65%. The  left ventricle has normal function. The left ventricle has no regional  wall motion abnormalities. Left ventricular diastolic parameters are  consistent with Grade II diastolic  dysfunction (pseudonormalization). The average left ventricular global  longitudinal strain is -22.0 %.   2. Right ventricular systolic function is mildly reduced. The right  ventricular size is moderately enlarged. There is severely  elevated  pulmonary artery systolic pressure. The estimated right ventricular  systolic pressure is 72.4 mmHg.   3. Left atrial size was mildly dilated.   4. Right atrial size was mildly dilated.   5. The mitral valve is normal in structure. No evidence of mitral valve  regurgitation. No evidence of mitral stenosis.   6. The aortic valve is normal in structure. Aortic valve regurgitation is  not visualized. Aortic valve sclerosis is present, with no evidence of  aortic valve stenosis.   7. The inferior vena cava is dilated in size with >50% respiratory  variability, suggesting right atrial pressure of 8 mmHg.   Risk Assessment/Calculations:             Physical Exam:   VS:  BP 130/70 (BP Location: Left Arm)   Pulse (!) 59   Ht 5' 5 (1.651 m)   Wt 155 lb (70.3 kg)   LMP  (LMP Unknown)   SpO2 95%   BMI 25.79 kg/m    Wt Readings from Last 3 Encounters:  03/05/23 155 lb (70.3 kg)  02/17/23 152 lb (68.9 kg)  02/03/23 156 lb 3.2 oz (70.9 kg)    GEN: Well nourished, well developed in no acute distress NECK: No JVD; No carotid bruits CARDIAC: RRR, no murmurs, rubs, gallops RESPIRATORY:  Clear to auscultation without rales, wheezing or rhonchi  ABDOMEN: Soft, non-tender, non-distended EXTREMITIES:  No edema; No deformity   ASSESSMENT AND PLAN: .   Chronic HFpEF  with echocardiogram in 01/2022 revealed LVEF 60-65%, no RWMA, RV mildly reduced, no TR and estimated RVSP.  Appears to be euvolemic on exam.  Suffer from NYHA class II symptoms.  She has been continued on Jardiance  10 mg daily, furosemide  40 mg daily as needed.  She has been encouraged to continue to weigh herself daily and continue to watch her sodium and fluid intake.  Mild to moderate pulmonary hypertension which she underwent right heart catheterization 09/2021, chest CT in 06/2021 showed no PCP but positive for mild emphysema, chest CT 124 revealed multiple tiny filling defects consistent with pulmonary emboli.  She was  recommended after following with Dr. Bensimhon to have a repeat echocardiogram within the next 6 months which is already been scheduled.  Coronary artery disease with RCA calcification noted on chest CT.  She continues to deny chest pain.  She is continued on clopidogrel  75 mg daily and Repatha .  EKG today reveals sinus bradycardia with a rate of 59 with right bundle branch block and left anterior fascicular block that is unchanged from prior studies but ischemic changes noted.  History Pulmonary embolism noted on prior CTA of the chest in 01/2022.  Patient is not no longer on oral anticoagulant medication.  Has been advised if she has recurrent PE or DVT he will require lifelong anticoagulant therapy.  Hyperlipidemia where she has been intolerant to statins.  LDL of 58 which is at goal.  She is continued on Repatha  140 mg every 2 weeks.  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  Patient denies any lightheadedness or dizziness.  No acute change to EKG.  Will continue to monitor with surveillance studies as needed.  Hypertension with blood pressure today 130/70.  She has been continued on furosemide  40 mg as needed and lisinopril  10 mg daily.  Blood pressure remained stable.  She has been encouraged to continue to monitor her blood pressure 1 to 2 hours postmedication ministration at home as well.       Dispo: Patient return to clinic to see MD/APP in 6 months or sooner if needed  Signed, Noraa Pickeral, NP

## 2023-03-05 NOTE — Patient Instructions (Signed)
Medication Instructions:  No changes at this time.   *If you need a refill on your cardiac medications before your next appointment, please call your pharmacy*   Lab Work: None  If you have labs (blood work) drawn today and your tests are completely normal, you will receive your results only by: MyChart Message (if you have MyChart) OR A paper copy in the mail If you have any lab test that is abnormal or we need to change your treatment, we will call you to review the results.   Testing/Procedures: None   Follow-Up: At [facility], you and your health needs are our priority.  As part of our continuing mission to provide you with exceptional heart care, we have created designated Provider Care Teams.  These Care Teams include your primary Cardiologist (physician) and Advanced Practice Providers (APPs -  Physician Assistants and Nurse Practitioners) who all work together to provide you with the care you need, when you need it.   Your next appointment:   6 month(s)  Provider:   Brian Agbor-Etang, MD or Sheri Hammock, NP     

## 2023-03-24 ENCOUNTER — Telehealth: Payer: Self-pay | Admitting: Internal Medicine

## 2023-03-24 NOTE — Telephone Encounter (Signed)
 Pt confirmed appt for 03/25/23

## 2023-03-25 ENCOUNTER — Encounter: Payer: Self-pay | Admitting: Internal Medicine

## 2023-03-25 ENCOUNTER — Ambulatory Visit (HOSPITAL_BASED_OUTPATIENT_CLINIC_OR_DEPARTMENT_OTHER): Payer: 59 | Admitting: Internal Medicine

## 2023-03-25 ENCOUNTER — Ambulatory Visit
Admission: RE | Admit: 2023-03-25 | Discharge: 2023-03-25 | Disposition: A | Discharge status: Home / Self Care | Payer: 59 | Source: Ambulatory Visit | Attending: Internal Medicine | Admitting: Internal Medicine

## 2023-03-25 VITALS — BP 122/57 | HR 66 | Ht 65.0 in | Wt 158.2 lb

## 2023-03-25 DIAGNOSIS — J449 Chronic obstructive pulmonary disease, unspecified: Secondary | ICD-10-CM | POA: Diagnosis not present

## 2023-03-25 DIAGNOSIS — I5032 Chronic diastolic (congestive) heart failure: Secondary | ICD-10-CM | POA: Diagnosis not present

## 2023-03-25 DIAGNOSIS — I272 Pulmonary hypertension, unspecified: Secondary | ICD-10-CM

## 2023-03-25 DIAGNOSIS — I2699 Other pulmonary embolism without acute cor pulmonale: Secondary | ICD-10-CM | POA: Diagnosis not present

## 2023-03-25 DIAGNOSIS — I083 Combined rheumatic disorders of mitral, aortic and tricuspid valves: Secondary | ICD-10-CM | POA: Diagnosis not present

## 2023-03-25 DIAGNOSIS — I110 Hypertensive heart disease with heart failure: Secondary | ICD-10-CM | POA: Diagnosis not present

## 2023-03-25 DIAGNOSIS — I509 Heart failure, unspecified: Secondary | ICD-10-CM | POA: Insufficient documentation

## 2023-03-25 DIAGNOSIS — Z87891 Personal history of nicotine dependence: Secondary | ICD-10-CM | POA: Diagnosis not present

## 2023-03-25 DIAGNOSIS — I371 Nonrheumatic pulmonary valve insufficiency: Secondary | ICD-10-CM | POA: Diagnosis not present

## 2023-03-25 DIAGNOSIS — I2510 Atherosclerotic heart disease of native coronary artery without angina pectoris: Secondary | ICD-10-CM

## 2023-03-25 DIAGNOSIS — E66812 Obesity, class 2: Secondary | ICD-10-CM | POA: Diagnosis not present

## 2023-03-25 LAB — ECHOCARDIOGRAM COMPLETE
AR max vel: 3.12 cm2
AV Area VTI: 3.07 cm2
AV Area mean vel: 2.96 cm2
AV Mean grad: 4 mmHg
AV Peak grad: 8.6 mmHg
Ao pk vel: 1.47 m/s
Area-P 1/2: 2.73 cm2
Calc EF: 62.6 %
MV VTI: 3.24 cm2
S' Lateral: 2.7 cm
Single Plane A2C EF: 55.2 %
Single Plane A4C EF: 68.8 %

## 2023-03-25 NOTE — Patient Instructions (Signed)
  Follow-Up in: 9 months PLEASE CALL OUR OFFICE TO GET SCHEDULED AROUND AUGUST TO GET SCHEDULED FOR THIS    If you have any questions or concerns before your next appointment please send [place] a message through [place] or call our office at [phone] Monday-Friday 8 am-5 pm.   If you have an urgent need after hours on the weekend please call your Primary Cardiologist or the [facility] Failure Clinic in [place] at [phone].   At the [facility], you and your health needs are our priority. We have a designated team specialized in the treatment of Heart Failure. This Care Team includes your primary Heart Failure Specialized Cardiologist (physician), Advanced Practice Providers (APPs- Physician Assistants and Nurse Practitioners), and Pharmacist who all work together to provide you with the care you need, when you need it.   You may see any of the following providers on your designated Care Team at your next follow up:  Dr. Arvilla Meres Dr. Marca Ancona Dr. Dorthula Nettles Dr. Theresia Bough Tonye Becket, NP Robbie Lis, [place] [address] [place], [place] Brynda Peon, NP [place] Lee, NP Clarisa Kindred, NP Enos Fling, PharmD

## 2023-03-25 NOTE — Progress Notes (Signed)
*  PRELIMINARY RESULTS* Echocardiogram 2D Echocardiogram has been performed.  Carolyne Fiscal 03/25/2023, 11:01 AM

## 2023-03-25 NOTE — Progress Notes (Signed)
 [facility]ED [facility] CLINIC CONSULT NOTE  Referring Physician:Brian Agbor-Etang, MD Primary Care: Jackolyn Confer, MD Primary Cardiologist: Debbe Odea, MD   HPI:  Brandi Steele is a 68 y.o. female with a hx of CAD (RCA calcium on chest CT), HTN, HL, former smoker x 40+ years, pulmonary hypertension, COPD, chronic hypoxic respiratory failure on home O2, OSA on CPAP, PAD s/p abdominal aortic surgery over 20 years ago and chronic diastolic HF who presents for follow-up. Referred by Dr. Areatha Keas for further evaluation of her pulmonary HTN.    Echo 03/2021 ([facility]) EF>55% , moderate TR, severe pulmonary hypertension RVSP . Echo 07/2021 EF 60- 65%, severe pulmonary hypertension, RVSP 72.4 mmHg RV mildly reduced  Right heart cath 09/2021  RA 5  PA 56/24 (35) PCWP 18 PVR 2.6 WU Fick 6.6/3.5  CT chest: 6/23: No PE + mild emphysema  No PFTs on chart  Treadmill stress test 03/2021 showed no significant ischemic EKG changes.  Previously followed by Dr,. Gwen Pounds and recently transitioned care to Dr. Azucena Cecil,   Has seen Dr. 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 Encouraged to be more compliant with home O2 and encouraged her to proceed with RHC. Stopped smoking 4 years ago but started vaping due to high stress. Stopped vaping 6 months ago/  Lives in [place]. Helps take of her husband who has traumatic brain injury. Also takes care of her brother who lives with her and had MVA at age 59 and needs FT care. Also cares for her 2 y/o great granddaughter.   I saw her for first time in 11/23 for PAH. 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 PFTs with DLCO ordered. Stressed need to be complaint with home O2 and CPAP to keep sats > 90% at all times  Admitted in 1/24 for diastolic HF. BNP minimally elevated at 100 but chest x-ray showed diffuse interstitial opacities suggesting edema. Treated with IV lasix.  CTA chest showed multiple tiny filling defects  consistent with pulmonary emboli. Started on Eliquis   Echo 1/24: EF 60-65% RV mildly reduced . No TR to estimate RVSP.   PFTs 01/23/22 with moderate obstruction FEV1 1.5 (62%)  FVC 2.57 (80%) Ratio 59% DLCO 52%  Here for routine f/u. Says she feels good. Very active. Took a 2.5 mile mountain hike yesterday. No undue SOB. No CP. No edema, orthopnea or PND. Has lost 42 pounds Off o2. Sats > 95%    Echo today 03/25/23 EF 60-65% G1 DD MR RV normal RVSP 33 Personally reviewed    Past Medical History:  Diagnosis Date   Anxiety    Aortic aneurysm (HCC)    Aortic stenosis    Caregiver role strain 12/05/2020   CHF (congestive heart failure) (HCC)    COPD (chronic obstructive pulmonary disease) (HCC)    Depression    Emphysema of lung (HCC)    Fibromyalgia    GERD (gastroesophageal reflux disease)    Hyperlipidemia    Hypertension    Hypertensive heart disease    IFG (impaired fasting glucose) 09/07/2019   Myalgia    Obesity    Pulmonary embolism (HCC) 02/06/2022   Sciatica    Seborrheic keratosis    Sleep apnea    Stress incontinence     Current Outpatient Medications  Medication Sig Dispense Refill   albuterol (VENTOLIN HFA) 108 (90 Base) MCG/ACT inhaler INHALE 2 PUFFS INTO THE LUNGS EVERY 6 HOURS AS NEEDED FOR WHEEZING  OR SHORTNESS OF BREATH 18 g 1   ALPRAZolam (XANAX) 0.5 MG tablet Take 1 tablet (0.5 mg total) by mouth 3 (three) times daily as needed for anxiety. 30 tablet 0   budesonide-formoterol (SYMBICORT) 160-4.5 MCG/ACT inhaler INHALE 2 PUFFS INTO THE LUNGS TWICE DAILY 10.2 g 5   Calcium Carbonate-Vit D-Min (CALCIUM 1200 PO) Take by mouth daily.     cetirizine (ZYRTEC) 10 MG tablet Take 1 tablet (10 mg total) by mouth daily. (Patient taking differently: Take 10 mg by mouth as needed.) 30 tablet 11   Chlorpheniramine-DM (DIMETAPP LONG ACT COUGH/COLD) 1-7.5 MG/5ML SYRP Take 10 mLs by mouth every 6 (six) hours as needed. 118 mL 0   clopidogrel (PLAVIX) 75 MG tablet TAKE  1 TABLET(75 MG) BY MOUTH DAILY 90 tablet 0   Coenzyme Q10 (CO Q 10 PO) Take 2 capsules by mouth every morning.     empagliflozin (JARDIANCE) 10 MG TABS tablet TAKE 1 TABLET(10 MG) BY MOUTH DAILY BEFORE BREAKFAST 30 tablet 11   Evolocumab (REPATHA SURECLICK) 140 MG/ML SOAJ Inject 140 mg into the skin every 14 (fourteen) days. 6 mL 3   Folic Acid (FOLATE PO) Take 1 tablet by mouth daily.     furosemide (LASIX) 40 MG tablet TAKE 1 TABLET(40 MG) BY MOUTH DAILY AS NEEDED 30 tablet 2   hydrOXYzine (ATARAX) 25 MG tablet TAKE 1 TABLET(25 MG) BY MOUTH THREE TIMES DAILY AS NEEDED (Patient taking differently: No sig reported) 90 tablet 2   lisinopril (ZESTRIL) 10 MG tablet Take 1 tablet (10 mg total) by mouth daily. 90 tablet 3   Magnesium 400 MG CAPS Take 550 mg by mouth. 2 in the AM and 2 in the PM     meloxicam (MOBIC) 15 MG tablet TAKE 1 TABLET(15 MG) BY MOUTH DAILY (Patient taking differently: as needed for pain. TAKE 1 TABLET(15 MG) BY MOUTH DAILY) 90 tablet 3   nystatin ointment (MYCOSTATIN) Apply 1 Application topically 2 (two) times daily. 30 g 0   omeprazole (PRILOSEC) 40 MG capsule Take 1 capsule (40 mg total) by mouth daily. 30 capsule 3   Potassium Gluconate 595 MG CAPS Take 400 mg by mouth in the morning, at noon, in the evening, and at bedtime. Take 4 capsules daily     triamcinolone cream (KENALOG) 0.1 % Apply 1 application. topically 2 (two) times daily. 80 g 1   No current facility-administered medications for this visit.   Facility-Administered Medications Ordered in Other Visits  Medication Dose Route Frequency Provider Last Rate Last Admin   albuterol (PROVENTIL) (2.5 MG/3ML) 0.083% nebulizer solution 2.5 mg  2.5 mg Nebulization Once Lynnann Knudsen, Bevelyn Buckles, MD        Allergies  Allergen Reactions   Morphine And Codeine Nausea And Vomiting   Celexa [Citalopram Hydrobromide] Other (See Comments)    Pruritis    Effexor [Venlafaxine] Other (See Comments)    Panic attack   Lipitor  [Atorvastatin] Other (See Comments)    Myalgias   Shellfish Allergy Hives and Swelling   Wellbutrin [Bupropion] Anxiety      Social History   Socioeconomic History   Marital status: Widowed    Spouse name: Not on file   Number of children: 3   Years of education: Not on file   Highest education level: Not on file  Occupational History   Occupation: retired  Tobacco Use   Smoking status: Former    Current packs/day: 0.00    Average packs/day: 1 pack/day for 40.0 years (  40.0 ttl pk-yrs)    Types: Cigarettes    Start date: 11/01/1976    Quit date: 11/01/2016    Years since quitting: 6.3   Smokeless tobacco: Never   Tobacco comments:    Is trying cold-turkey with social support  Vaping Use   Vaping status: Former   Start date: 12/02/2016   Quit date: 09/06/2021  Substance and Sexual Activity   Alcohol use: No    Alcohol/week: 0.0 standard drinks of alcohol   Drug use: No   Sexual activity: Not Currently  Other Topics Concern   Not on file  Social History Narrative   Husband passed away 10-19-2022, 3 children, 1 passed from drug use   Social Drivers of Corporate investment banker Strain: Low Risk  (10/28/2022)   Overall Financial Resource Strain (CARDIA)    Difficulty of Paying Living Expenses: Not hard at all  Food Insecurity: No Food Insecurity (10/28/2022)   Hunger Vital Sign    Worried About Running Out of Food in the Last Year: Never true    Ran Out of Food in the Last Year: Never true  Transportation Needs: No Transportation Needs (10/28/2022)   PRAPARE - Administrator, Civil Service (Medical): No    Lack of Transportation (Non-Medical): No  Physical Activity: Insufficiently Active (10/28/2022)   Exercise Vital Sign    Days of Exercise per Week: 5 days    Minutes of Exercise per Session: 10 min  Stress: No Stress Concern Present (10/28/2022)   [employer] of Occupational Health - Occupational Stress Questionnaire    Feeling of Stress : Only a little   Social Connections: Moderately Isolated (10/28/2022)   Social Connection and Isolation Panel [NHANES]    Frequency of Communication with Friends and Family: More than three times a week    Frequency of Social Gatherings with Friends and Family: More than three times a week    Attends Religious Services: More than 4 times per year    Active Member of [employer] or Organizations: No    Attends Banker Meetings: Never    Marital Status: Widowed  Intimate Partner Violence: Not At Risk (10/28/2022)   Humiliation, Afraid, Rape, and Kick questionnaire    Fear of Current or Ex-Partner: No    Emotionally Abused: No    Physically Abused: No    Sexually Abused: No      Family History  Problem Relation Age of Onset   Stroke Mother    Hypertension Mother    Heart disease Father    Diabetes Brother    Stroke Maternal Grandmother    Stroke Maternal Grandfather    Heart disease Paternal Grandmother    Heart disease Paternal Grandfather    Breast cancer Neg Hx     Vitals:   03/25/23 1447  BP: (!) 122/57  Pulse: 66  SpO2: 98%  Weight: 158 lb 3.2 oz (71.8 kg)  Height: 5\' 5"  (1.651 m)   Wt Readings from Last 3 Encounters:  03/25/23 158 lb 3.2 oz (71.8 kg)  03/05/23 155 lb (70.3 kg)  02/17/23 152 lb (68.9 kg)     PHYSICAL EXAM: General:  Well appearing. No resp difficulty HEENT: normal Neck: supple. no JVD. Carotids 2+ bilat; no bruits. No lymphadenopathy or thryomegaly appreciated. Cor: PMI nondisplaced. Regular rate & rhythm. No rubs, gallops or murmurs. Lungs: mildly decreased Abdomen: soft, nontender, nondistended. No hepatosplenomegaly. No bruits or masses. Good bowel sounds. Extremities: no cyanosis, clubbing, rash, edema Neuro: alert &  orientedx3, cranial nerves grossly intact. moves all 4 extremities w/o difficulty. Affect pleasant   ASSESSMENT & PLAN:   1. Pulmonary HTN, mild to moderate - Echo 03/2021 ([facility]) EF>55% , moderate TR, severe pulmonary hypertension  RVSP . - Echo 07/2021 EF 60- 65%, severe pulmonary hypertension, RVSP 72.4 mmHg RV mildly reduced - Echo 1/24 EF 60-65% TR inadequate to assess RVSP - but no apical windows available - Right heart cath 09/2021  RA 5  PA 56/24 (35) PCWP 18 PVR 2.6 WU Fick 6.6/3.5 - CT chest: 6/23: No PE + mild emphysema - CT chest 1/24 multiple tiny filling defects consistent with pulmonary emboli. - PFTs 01/23/22 with moderate obstruction FEV1 1.5 (62%)  FVC 2.57 (80%) Ratio 59% DLCO 52% - Likely WHO Group III PH (with component of WHO Group II) and thus no role for selective pulmonary artery vasodilators. Doubt significant CTEPH but will need VQ down the road given recent PEs - Auto-immune serologies negative - Now off O2 with 40+ pound weight loss. Monitoring sats and stay > 95% - Echo today 03/25/23 EF 60-65% G1 DD MR RV normal RVSP 33 Personally reviewed - She is doing great after 42 pound weight loss!  NYHA . Volume status looks good.   2. Chronic diastolic HF - Echo 1/24: EF 60-65% RV mildly reduced . No TR to estimate RVSP. - Continue Jardiance - Fluid looks great - Echo from today as above  3. Chronic hypoxic respiratory failure on home O2 - PFTs as above -- Now off O2 with 40+ pound weight loss. Monitoring sats and stay > 95%  4. CAD - per Dr. Azucena Cecil - No s/s angina  5. Obesity - Body mass index is 26.33 kg/m. - has lost 42 pounds with diet and exercise - Congratulated her  6. Pulmonary embolism - now off Eliquis   7. OSA - remains on CPAP  - suspect OSA much improved with weight loss  Arvilla Meres, MD  3:04 PM

## 2023-04-07 ENCOUNTER — Encounter: Payer: Self-pay | Admitting: Pediatrics

## 2023-04-07 ENCOUNTER — Ambulatory Visit (INDEPENDENT_AMBULATORY_CARE_PROVIDER_SITE_OTHER): Payer: Medicare HMO | Admitting: Pediatrics

## 2023-04-07 VITALS — BP 125/73 | HR 60 | Temp 98.7°F | Ht 65.0 in | Wt 157.4 lb

## 2023-04-07 DIAGNOSIS — R0982 Postnasal drip: Secondary | ICD-10-CM | POA: Insufficient documentation

## 2023-04-07 DIAGNOSIS — I10 Essential (primary) hypertension: Secondary | ICD-10-CM

## 2023-04-07 DIAGNOSIS — Z1330 Encounter for screening examination for mental health and behavioral disorders, unspecified: Secondary | ICD-10-CM | POA: Diagnosis not present

## 2023-04-07 DIAGNOSIS — H6123 Impacted cerumen, bilateral: Secondary | ICD-10-CM | POA: Diagnosis not present

## 2023-04-07 MED ORDER — FLUTICASONE PROPIONATE 50 MCG/ACT NA SUSP: 2.0000 | Freq: Every day | NASAL | 6 refills | Status: AC

## 2023-04-07 MED ORDER — LISINOPRIL 5 MG PO TABS: 5.0000 mg | ORAL_TABLET | Freq: Every day | ORAL | 3 refills | Status: AC

## 2023-04-07 NOTE — Patient Instructions (Addendum)
 Debrox - over the counter to prevent ear wax build up. Try to use this 1 -2 times a month.  For the nasal drip: start daily flonase nasal spray. It is ok to take zyrtec or claritin daily to help with it as well.  I sent the lower dose of lisinopril to you pharmacy as well.

## 2023-04-07 NOTE — Assessment & Plan Note (Signed)
 Blood pressure well-controlled with recent readings. Lisinopril reduced to 5 mg due to medication bottle confusion. Cardiologist has no concerns. - Reduce lisinopril dosage to 5 mg daily and monitor blood pressure at home. - Send prescription for lisinopril 5 mg tablets. - Follow up in three months to reassess blood pressure management.

## 2023-04-07 NOTE — Progress Notes (Signed)
 Office Visit  BP 125/73   Pulse 60   Temp 98.7 F (37.1 C) (Oral)   Ht 5\' 5"  (1.651 m)   Wt 157 lb 6.4 oz (71.4 kg)   LMP  (LMP Unknown)   SpO2 93%   BMI 26.19 kg/m    Subjective:    Patient ID: Brandi Steele, female    DOB: 04/30/1955, 68 y.o.   MRN: [id number]  HPI: Brandi Steele is a 68 y.o. female  Chief Complaint  Patient presents with   Hypertension    Patient states she thinks she may need to do back to 5 mg of lisinopril. States she thinks the 10 mg is to strong and is dropping her diastolic number to low.     Depression   Anxiety   Insomnia   Gastroesophageal Reflux    Discussed the use of AI scribe software for clinical note transcription with the patient, who gave verbal consent to proceed.  History of Present Illness   The patient presents with hypertension management and concerns about postnasal drip.  She is managing her hypertension with home blood pressure readings typically in the 130s/70s range, with a recent reading of 122/57 from a cardiologist visit. She has been taking 5 mg of lisinopril instead of the prescribed 10 mg due to confusion with her medication bottles and often forgets to take her medication in the morning due to being in a hurry. She has started walking regularly, including hiking up [facility] and walking at [facility], which may be contributing to her current blood pressure readings.  She has been experiencing postnasal drip for several months, characterized by clear phlegm, weeping eyes, and throat irritation affecting her voice. She often has to cough to clear her throat. She has not tried any treatments for this due to concerns about potential effects on her heart.  She reports difficulty hearing, which she attributes to earwax buildup. She has had her ears cleaned in the past and prefers professional cleaning over using Q-tips or other methods at home.  Socially, she has started dating and enjoys hiking with her friend and  granddaughter. She met her friend online and appreciates the companionship and activity without any romantic pressure.      Wt Readings from Last 3 Encounters:  04/07/23 157 lb 6.4 oz (71.4 kg)  03/25/23 158 lb 3.2 oz (71.8 kg)  03/05/23 155 lb (70.3 kg)    Relevant past medical, surgical, family and social history reviewed and updated as indicated. Interim medical history since our last visit reviewed. Allergies and medications reviewed and updated.  ROS per HPI unless specifically indicated above     Objective:    BP 125/73   Pulse 60   Temp 98.7 F (37.1 C) (Oral)   Ht 5\' 5"  (1.651 m)   Wt 157 lb 6.4 oz (71.4 kg)   LMP  (LMP Unknown)   SpO2 93%   BMI 26.19 kg/m   Wt Readings from Last 3 Encounters:  04/07/23 157 lb 6.4 oz (71.4 kg)  03/25/23 158 lb 3.2 oz (71.8 kg)  03/05/23 155 lb (70.3 kg)     Physical Exam Constitutional:      Appearance: Normal appearance.  HENT:     Head: Normocephalic and atraumatic.     Right Ear: There is impacted cerumen.     Left Ear: There is impacted cerumen.  Eyes:     Pupils: Pupils are equal, round, and reactive to light.  Cardiovascular:  Rate and Rhythm: Normal rate and regular rhythm.     Pulses: Normal pulses.     Heart sounds: Normal heart sounds.  Pulmonary:     Effort: Pulmonary effort is normal.     Breath sounds: Normal breath sounds.  Musculoskeletal:        General: Normal range of motion.     Cervical back: Normal range of motion.  Skin:    General: Skin is warm and dry.  Neurological:     General: No focal deficit present.     Mental Status: She is alert. Mental status is at baseline.  Psychiatric:        Mood and Affect: Mood normal.        Behavior: Behavior normal.         04/07/2023    8:17 AM 01/12/2023    3:56 PM 11/06/2022   10:56 AM 10/28/2022   10:44 AM 10/09/2022    1:15 PM  Depression screen PHQ 2/9  Decreased Interest 0 0 0 0 0  Down, Depressed, Hopeless 0 0 0 0 0  PHQ - 2 Score 0 0 0  0 0  Altered sleeping 0 0 0 1 0  Tired, decreased energy 0 0 0 0 0  Change in appetite 0 0 0 0 0  Feeling bad or failure about yourself  0 0 0 0 0  Trouble concentrating 0 0 0 0 0  Moving slowly or fidgety/restless 0 0 0 0 0  Suicidal thoughts 0 0 0 0 0  PHQ-9 Score 0 0 0 1 0  Difficult doing work/chores Not difficult at all Not difficult at all Not difficult at all Not difficult at all Not difficult at all       04/07/2023    8:17 AM 01/12/2023    3:56 PM 11/06/2022   10:57 AM 08/25/2022    9:05 AM  GAD 7 : Generalized Anxiety Score  Nervous, Anxious, on Edge 0 0 0 0  Control/stop worrying 0 0 0 0  Worry too much - different things 0 0 0 0  Trouble relaxing 0 0 0 0  Restless 0 0 0 0  Easily annoyed or irritable 0 0 0 0  Afraid - awful might happen 0 0 0 0  Total GAD 7 Score 0 0 0 0  Anxiety Difficulty Not difficult at all Not difficult at all Not difficult at all Not difficult at all       Assessment & Plan:  Assessment & Plan   Essential hypertension Assessment & Plan: Blood pressure well-controlled with recent readings. Lisinopril reduced to 5 mg due to medication bottle confusion. Cardiologist has no concerns. - Reduce lisinopril dosage to 5 mg daily and monitor blood pressure at home. - Send prescription for lisinopril 5 mg tablets. - Follow up in three months to reassess blood pressure management.  Orders: -     Lisinopril; Take 1 tablet (5 mg total) by mouth daily.  Dispense: 90 tablet; Refill: 3  Post-nasal drip Assessment & Plan: Intermittent nasal drip, watery eyes, and clear phlegm likely due to allergies or dryness. Flonase recommended with demonstrated administration technique. Zyrtec or Claritin are compatible with heart medications. - Recommend daily use of Flonase nasal spray, demonstrating proper administration technique. - Consider over-the-counter Zyrtec or Claritin if needed, ensuring compatibility with heart medications.   Bilateral impacted  cerumen Significant earwax buildup causing hearing difficulties. Ear irrigation recommended. - Perform ear irrigation to remove earwax. - Recommend using Debrox drops monthly  to prevent earwax buildup. -     Ear Lavage  Encounter for behavioral health screening As part of their intake evaluation, the patient was screened for depression, anxiety.  PHQ9 SCORE 0, GAD7 SCORE 0. Screening results negative for tested conditions. CTM.    Follow up plan: Return in 3 months (on 07/08/2023) for HTN, imaging follow up.  Jackolyn Confer, MD

## 2023-04-07 NOTE — Assessment & Plan Note (Signed)
 Intermittent nasal drip, watery eyes, and clear phlegm likely due to allergies or dryness. Flonase recommended with demonstrated administration technique. Zyrtec or Claritin are compatible with heart medications. - Recommend daily use of Flonase nasal spray, demonstrating proper administration technique. - Consider over-the-counter Zyrtec or Claritin if needed, ensuring compatibility with heart medications.

## 2023-05-01 ENCOUNTER — Other Ambulatory Visit: Payer: Self-pay | Admitting: Pediatrics

## 2023-05-13 ENCOUNTER — Other Ambulatory Visit: Payer: Self-pay | Admitting: Pharmacist

## 2023-05-13 MED ORDER — REPATHA SURECLICK 140 MG/ML ~~LOC~~ SOAJ: 140.0000 mg | SUBCUTANEOUS | 3 refills | Status: DC

## 2023-05-15 ENCOUNTER — Other Ambulatory Visit: Payer: Self-pay | Admitting: Pediatrics

## 2023-05-15 NOTE — Telephone Encounter (Signed)
 Requested Prescriptions  Pending Prescriptions Disp Refills   omeprazole  (PRILOSEC) 40 MG capsule [Pharmacy Med Name: OMEPRAZOLE  40MG  CAPSULES] 90 capsule 0    Sig: TAKE 1 CAPSULE(40 MG) BY MOUTH DAILY     Gastroenterology: Proton Pump Inhibitors Passed - 05/15/2023  3:38 PM      Passed - Valid encounter within last 12 months    Recent Outpatient Visits           1 month ago Essential hypertension   [place] [facility] Hadassah Letters, MD

## 2023-05-30 ENCOUNTER — Other Ambulatory Visit: Payer: Self-pay | Admitting: Pediatrics

## 2023-05-30 DIAGNOSIS — J449 Chronic obstructive pulmonary disease, unspecified: Secondary | ICD-10-CM

## 2023-06-02 NOTE — Telephone Encounter (Signed)
 Requested Prescriptions  Pending Prescriptions Disp Refills   budesonide -formoterol  (SYMBICORT ) 160-4.5 MCG/ACT inhaler [Pharmacy Med Name: BUDESONIDE /FORM 160/4.5MCG(120 INH)] 10.2 g 1    Sig: INHALE 2 PUFFS INTO THE LUNGS TWICE DAILY     Pulmonology:  Combination Products Passed - 06/02/2023 11:53 AM      Passed - Valid encounter within last 12 months    Recent Outpatient Visits           1 month ago Essential hypertension    [facility] Hadassah Letters, MD

## 2023-06-11 ENCOUNTER — Telehealth: Payer: Self-pay | Admitting: Pharmacy Technician

## 2023-06-11 ENCOUNTER — Other Ambulatory Visit (HOSPITAL_COMMUNITY): Payer: Self-pay

## 2023-06-11 NOTE — Telephone Encounter (Signed)
 Pharmacy Patient Advocate Encounter  Received notification from [facility] that Prior Authorization for Repatha  has been APPROVED from 06/11/23 to 12/12/23. Ran test claim, Copay is $0.00- 3 months. This test claim was processed through [facility]- copay amounts may vary at other pharmacies due to pharmacy/plan contracts, or as the patient moves through the different stages of their insurance plan.   PA #/Case ID/Reference #: [id number]

## 2023-06-11 NOTE — Telephone Encounter (Signed)
 Pharmacy Patient Advocate Encounter   Received notification from CoverMyMeds that prior authorization for Repatha  is required/requested.   Insurance verification completed.   The patient is insured through [place] .   Per test claim: PA required; PA submitted to above mentioned insurance via CoverMyMeds Key/confirmation #/EOC [id number] Status is pending

## 2023-06-14 ENCOUNTER — Other Ambulatory Visit: Payer: Self-pay | Admitting: Pediatrics

## 2023-06-16 ENCOUNTER — Encounter (INDEPENDENT_AMBULATORY_CARE_PROVIDER_SITE_OTHER): Payer: Self-pay

## 2023-06-17 NOTE — Telephone Encounter (Signed)
 Duplicate request, too soon for refill.  Requested Prescriptions  Pending Prescriptions Disp Refills   lisinopril  (ZESTRIL ) 10 MG tablet [Pharmacy Med Name: LISINOPRIL  10MG  TABLETS] 90 tablet 1    Sig: TAKE 1 TABLET(10 MG) BY MOUTH DAILY     Cardiovascular:  ACE Inhibitors Failed - 06/17/2023  8:12 AM      Failed - Cr in normal range and within 180 days    Creatinine  Date Value Ref Range Status  06/16/2013 0.63 0.60 - 1.30 mg/dL Final   Creatinine, Ser  Date Value Ref Range Status  12/16/2022 0.66 0.44 - 1.00 mg/dL Final         Failed - K in normal range and within 180 days    Potassium  Date Value Ref Range Status  12/16/2022 4.3 3.5 - 5.1 mmol/L Final  06/16/2013 4.0 3.5 - 5.1 mmol/L Final         Passed - Patient is not pregnant      Passed - Last BP in normal range    BP Readings from Last 1 Encounters:  04/07/23 125/73         Passed - Valid encounter within last 6 months    Recent Outpatient Visits           2 months ago Essential hypertension   [place] [facility] Hadassah Letters, MD

## 2023-06-28 ENCOUNTER — Other Ambulatory Visit: Payer: Self-pay | Admitting: Pediatrics

## 2023-06-28 DIAGNOSIS — J449 Chronic obstructive pulmonary disease, unspecified: Secondary | ICD-10-CM

## 2023-06-30 NOTE — Telephone Encounter (Signed)
 Requested Prescriptions  Pending Prescriptions Disp Refills   budesonide -formoterol  (SYMBICORT ) 160-4.5 MCG/ACT inhaler [Pharmacy Med Name: BUDESONIDE /FORM 160/4.5MCG(120 INH)] 10.2 g 1    Sig: INHALE 2 PUFFS INTO THE LUNGS TWICE DAILY     Pulmonology:  Combination Products Passed - 06/30/2023  9:13 AM      Passed - Valid encounter within last 12 months    Recent Outpatient Visits           2 months ago Essential hypertension   [place] [address] [facility] Hadassah Letters, MD

## 2023-07-14 ENCOUNTER — Encounter: Payer: Self-pay | Admitting: Pediatrics

## 2023-07-14 ENCOUNTER — Ambulatory Visit (INDEPENDENT_AMBULATORY_CARE_PROVIDER_SITE_OTHER): Admitting: Pediatrics

## 2023-07-14 VITALS — BP 111/68 | HR 65 | Temp 98.2°F | Wt 152.4 lb

## 2023-07-14 DIAGNOSIS — F4323 Adjustment disorder with mixed anxiety and depressed mood: Secondary | ICD-10-CM | POA: Diagnosis not present

## 2023-07-14 DIAGNOSIS — R7303 Prediabetes: Secondary | ICD-10-CM | POA: Diagnosis not present

## 2023-07-14 DIAGNOSIS — I10 Essential (primary) hypertension: Secondary | ICD-10-CM | POA: Diagnosis not present

## 2023-07-14 DIAGNOSIS — Z638 Other specified problems related to primary support group: Secondary | ICD-10-CM

## 2023-07-14 DIAGNOSIS — H9193 Unspecified hearing loss, bilateral: Secondary | ICD-10-CM

## 2023-07-14 MED ORDER — ALPRAZOLAM 0.5 MG PO TABS
0.5000 mg | ORAL_TABLET | Freq: Every evening | ORAL | 0 refills | Status: AC | PRN
Start: 2023-07-14 — End: ?

## 2023-07-14 NOTE — Patient Instructions (Signed)
 Sent 30 tabs on alprazolam   Placed referral for ENT

## 2023-07-14 NOTE — Assessment & Plan Note (Addendum)
 Significant stress and anxiety due to caregiving responsibilities, leading to sleep disturbances. Hydroxyzine  causes grogginess, trazodone  ineffective. Discussed benzo precautions and risks for adverse events in older adults. He understands risks and wants to continue his current regiment. - Prescribe Xanax  30 tablets for short-term use to aid sleep. - Discuss potential risks of Xanax , including early dementia and falls. - Schedule follow-up in one month to reassess sleep and anxiety management.

## 2023-07-14 NOTE — Assessment & Plan Note (Signed)
 Due for repeat A1c.

## 2023-07-14 NOTE — Assessment & Plan Note (Signed)
 Well-controlled.  Continue current regimen.
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
## 2023-07-15 ENCOUNTER — Encounter: Payer: Self-pay | Admitting: Pediatrics

## 2023-07-15 ENCOUNTER — Ambulatory Visit: Payer: Self-pay | Admitting: Pediatrics

## 2023-07-15 LAB — BASIC METABOLIC PANEL WITH GFR
BUN/Creatinine Ratio: 18 (ref 12–28)
BUN: 13 mg/dL (ref 8–27)
CO2: 22 mmol/L (ref 20–29)
Calcium: 9.2 mg/dL (ref 8.7–10.3)
Chloride: 104 mmol/L (ref 96–106)
Creatinine, Ser: 0.73 mg/dL (ref 0.57–1.00)
Glucose: 76 mg/dL (ref 70–99)
Potassium: 4.1 mmol/L (ref 3.5–5.2)
Sodium: 144 mmol/L (ref 134–144)
eGFR: 89 mL/min/{1.73_m2} (ref >59)

## 2023-07-15 LAB — MAGNESIUM: Magnesium: 2.1 mg/dL (ref 1.6–2.3)

## 2023-07-15 LAB — HEMOGLOBIN A1C
Est. average glucose Bld gHb Est-mCnc: 114 mg/dL
Hgb A1c MFr Bld: 5.6 % (ref 4.8–5.6)

## 2023-07-17 ENCOUNTER — Other Ambulatory Visit: Payer: Self-pay | Admitting: Pediatrics

## 2023-07-17 DIAGNOSIS — E782 Mixed hyperlipidemia: Secondary | ICD-10-CM

## 2023-07-17 NOTE — Progress Notes (Signed)
 Future lipid panel order placed per patient request.  Hadassah Letters, MD

## 2023-07-28 ENCOUNTER — Other Ambulatory Visit (INDEPENDENT_AMBULATORY_CARE_PROVIDER_SITE_OTHER): Payer: Self-pay | Admitting: Nurse Practitioner

## 2023-07-28 DIAGNOSIS — I714 Abdominal aortic aneurysm, without rupture, unspecified: Secondary | ICD-10-CM

## 2023-07-30 ENCOUNTER — Other Ambulatory Visit: Payer: Self-pay | Admitting: Cardiology

## 2023-07-30 ENCOUNTER — Other Ambulatory Visit: Payer: Self-pay | Admitting: Family Medicine

## 2023-08-03 ENCOUNTER — Ambulatory Visit (INDEPENDENT_AMBULATORY_CARE_PROVIDER_SITE_OTHER): Payer: Medicare HMO | Admitting: Nurse Practitioner

## 2023-08-03 ENCOUNTER — Ambulatory Visit (INDEPENDENT_AMBULATORY_CARE_PROVIDER_SITE_OTHER): Payer: Medicare HMO

## 2023-08-03 ENCOUNTER — Encounter (INDEPENDENT_AMBULATORY_CARE_PROVIDER_SITE_OTHER): Payer: Self-pay | Admitting: Nurse Practitioner

## 2023-08-03 VITALS — BP 116/67 | HR 58 | Ht 65.0 in | Wt 152.2 lb

## 2023-08-03 DIAGNOSIS — I10 Essential (primary) hypertension: Secondary | ICD-10-CM

## 2023-08-03 DIAGNOSIS — I714 Abdominal aortic aneurysm, without rupture, unspecified: Secondary | ICD-10-CM

## 2023-08-03 DIAGNOSIS — E782 Mixed hyperlipidemia: Secondary | ICD-10-CM | POA: Diagnosis not present

## 2023-08-03 NOTE — Telephone Encounter (Signed)
 Requested medication (s) are due for refill today:   Yes  Requested medication (s) are on the active medication list:   Yes  Future visit scheduled:   Yes 7/22   Last ordered: 05/01/2023 #90, 0 refills  Unable to refill because labs are due per protocol   Requested Prescriptions  Pending Prescriptions Disp Refills   clopidogrel  (PLAVIX ) 75 MG tablet [Pharmacy Med Name: CLOPIDOGREL  75MG  TABLETS] 90 tablet 0    Sig: TAKE 1 TABLET(75 MG) BY MOUTH DAILY     Hematology: Antiplatelets - clopidogrel  Failed - 08/03/2023  2:08 PM      Failed - HCT in normal range and within 180 days    HCT  Date Value Ref Range Status  12/16/2022 40.9 36.0 - 46.0 % Final   Hematocrit  Date Value Ref Range Status  10/09/2022 46.9 (H) 34.0 - 46.6 % Final         Failed - HGB in normal range and within 180 days    Hemoglobin  Date Value Ref Range Status  12/16/2022 13.1 12.0 - 15.0 g/dL Final  90/87/7975 85.2 11.1 - 15.9 g/dL Final         Failed - PLT in normal range and within 180 days    Platelets  Date Value Ref Range Status  12/16/2022 163 150 - 400 K/uL Final  10/09/2022 182 150 - 450 x10E3/uL Final         Passed - Cr in normal range and within 360 days    Creatinine  Date Value Ref Range Status  06/16/2013 0.63 0.60 - 1.30 mg/dL Final   Creatinine, Ser  Date Value Ref Range Status  07/14/2023 0.73 0.57 - 1.00 mg/dL Final         Passed - Valid encounter within last 6 months    Recent Outpatient Visits           2 weeks ago Adjustment disorder with mixed anxiety and depressed mood   [place] [facility] Herold Hadassah SQUIBB, MD   3 months ago Essential hypertension   [place] [facility] Herold Hadassah SQUIBB, MD

## 2023-08-03 NOTE — Progress Notes (Signed)
 Subjective:    Patient ID: Brandi Steele, female    DOB: [birth year], 68 y.o.   MRN: [id number] Chief Complaint  Patient presents with   F/u 6 months  AAA     The patient is a 68 year old female that returns today for follow-up evaluation of her abdominal aortic aneurysm.  She recently had a CT scan done on 12/16/2022 which showed an abdominal aneurysm measurement of 3.6 cm.  The patient has a history of previous intervention to her abdominal aorta.  She had an abdominal endarterectomy several years ago.  She has done fairly well postintervention and has recently lost a significant amount of weight by changing her diet.  About a year ago she had a measurement done on her abdominal aortic aneurysm measuring 2.8 cm.  There was concern that there may be significant change in the maximum diameter, however it was noted that the previous ultrasound was likely underestimated.  Previous ultrasound on 02/03/2023 measured her abdominal aortic aneurysm at 3.5 cm.  Today the abdominal aortic measurement is 3.55 cm.    Review of Systems  Cardiovascular:  Negative for leg swelling.  Gastrointestinal:  Negative for abdominal pain.  All other systems reviewed and are negative.      Objective:   Physical Exam Vitals reviewed.  HENT:     Head: Normocephalic.  Cardiovascular:     Rate and Rhythm: Normal rate.  Pulmonary:     Effort: Pulmonary effort is normal.  Skin:    General: Skin is warm and dry.  Neurological:     Mental Status: She is alert and oriented to person, place, and time.  Psychiatric:        Mood and Affect: Mood normal.        Behavior: Behavior normal.        Thought Content: Thought content normal.        Judgment: Judgment normal.     BP 116/67   Pulse (!) 58   Ht 5' 5 (1.651 m)   Wt 152 lb 4 oz (69.1 kg)   LMP  (LMP Unknown)   BMI 25.34 kg/m   Past Medical History:  Diagnosis Date   Anxiety    Aortic aneurysm (HCC)    Aortic stenosis    Caregiver role strain  12/05/2020   CHF (congestive heart failure) (HCC)    COPD (chronic obstructive pulmonary disease) (HCC)    Depression    Emphysema of lung (HCC)    Fibromyalgia    GERD (gastroesophageal reflux disease)    Hyperlipidemia    Hypertension    Hypertensive heart disease    IFG (impaired fasting glucose) 09/07/2019   Myalgia    Obesity    Prediabetes 08/25/2022   Pulmonary embolism (HCC) 02/06/2022   Sciatica    Seborrheic keratosis    Sleep apnea    Stress incontinence     Social History   Socioeconomic History   Marital status: Widowed    Spouse name: Not on file   Number of children: 3   Years of education: Not on file   Highest education level: Not on file  Occupational History   Occupation: retired  Tobacco Use   Smoking status: Former    Current packs/day: 0.00    Average packs/day: 1 pack/day for 40.0 years (40.0 ttl pk-yrs)    Types: Cigarettes    Start date: 11/01/1976    Quit date: 11/01/2016    Years since quitting: 6.7   Smokeless tobacco: Never  Tobacco comments:    Is trying cold-turkey with social support  Vaping Use   Vaping status: Former   Start date: 12/02/2016   Quit date: 09/06/2021  Substance and Sexual Activity   Alcohol use: No    Alcohol/week: 0.0 standard drinks of alcohol   Drug use: No   Sexual activity: Not Currently  Other Topics Concern   Not on file  Social History Narrative   Husband passed away 2022/10/15, 3 children, 1 passed from drug use   Social Drivers of Corporate investment banker Strain: Low Risk  (10/28/2022)   Overall Financial Resource Strain (CARDIA)    Difficulty of Paying Living Expenses: Not hard at all  Food Insecurity: No Food Insecurity (10/28/2022)   Hunger Vital Sign    Worried About Running Out of Food in the Last Year: Never true    Ran Out of Food in the Last Year: Never true  Transportation Needs: No Transportation Needs (10/28/2022)   PRAPARE - Administrator, Civil Service (Medical): No    Lack  of Transportation (Non-Medical): No  Physical Activity: Insufficiently Active (10/28/2022)   Exercise Vital Sign    Days of Exercise per Week: 5 days    Minutes of Exercise per Session: 10 min  Stress: No Stress Concern Present (10/28/2022)   [employer] of Occupational Health - Occupational Stress Questionnaire    Feeling of Stress : Only a little  Social Connections: Moderately Isolated (10/28/2022)   Social Connection and Isolation Panel    Frequency of Communication with Friends and Family: More than three times a week    Frequency of Social Gatherings with Friends and Family: More than three times a week    Attends Religious Services: More than 4 times per year    Active Member of [employer] or Organizations: No    Attends Banker Meetings: Never    Marital Status: Widowed  Intimate Partner Violence: Not At Risk (10/28/2022)   Humiliation, Afraid, Rape, and Kick questionnaire    Fear of Current or Ex-Partner: No    Emotionally Abused: No    Physically Abused: No    Sexually Abused: No    Past Surgical History:  Procedure Laterality Date   CARDIAC CATHETERIZATION     COLONOSCOPY WITH PROPOFOL  N/A 02/17/2023   Procedure: COLONOSCOPY WITH PROPOFOL ;  Surgeon: Therisa Bi, MD;  Location: [facility] ENDOSCOPY;  Service: Gastroenterology;  Laterality: N/A;   RIGHT HEART CATH N/A 10/07/2021   Procedure: RIGHT HEART CATH;  Surgeon: Darron Deatrice LABOR, MD;  Location: [facility] INVASIVE CV LAB;  Service: Cardiovascular;  Laterality: N/A;   TONSILLECTOMY     TOTAL ABDOMINAL HYSTERECTOMY     partial    Family History  Problem Relation Age of Onset   Stroke Mother    Hypertension Mother    Heart disease Father    Diabetes Brother    Stroke Maternal Grandmother    Stroke Maternal Grandfather    Heart disease Paternal Grandmother        Heart disease   Heart disease Paternal Grandfather        Heart disease   Breast cancer Neg Hx     Allergies  Allergen Reactions   Morphine  And Codeine  Nausea And Vomiting   Celexa [Citalopram Hydrobromide] Other (See Comments)    Pruritis    Effexor [Venlafaxine] Other (See Comments)    Panic attack   Lipitor [Atorvastatin ] Other (See Comments)    Myalgias   Shellfish Allergy  Hives and Swelling   Wellbutrin [Bupropion] Anxiety       Latest Ref Rng & Units 12/16/2022    3:23 PM 10/09/2022    1:52 PM 05/08/2022   12:14 PM  CBC  WBC 4.0 - 10.5 K/uL 6.9  6.2  6.2   Hemoglobin 12.0 - 15.0 g/dL 86.8  85.2  86.1   Hematocrit 36.0 - 46.0 % 40.9  46.9  42.9   Platelets 150 - 400 K/uL 163  182  172       CMP     Component Value Date/Time   NA 144 07/14/2023 0936   NA 143 06/16/2013 1946   K 4.1 07/14/2023 0936   K 4.0 06/16/2013 1946   CL 104 07/14/2023 0936   CL 103 06/16/2013 1946   CO2 22 07/14/2023 0936   CO2 31 06/16/2013 1946   GLUCOSE 76 07/14/2023 0936   GLUCOSE 96 12/16/2022 1523   GLUCOSE 93 06/16/2013 1946   BUN 13 07/14/2023 0936   BUN 13 06/16/2013 1946   CREATININE 0.73 07/14/2023 0936   CREATININE 0.63 06/16/2013 1946   CALCIUM  9.2 07/14/2023 0936   CALCIUM  9.0 06/16/2013 1946   PROT 6.8 12/16/2022 1523   PROT 6.8 08/25/2022 0941   PROT 7.5 06/16/2013 1946   ALBUMIN 4.0 12/16/2022 1523   ALBUMIN 4.6 08/25/2022 0941   ALBUMIN 3.8 06/16/2013 1946   AST 16 12/16/2022 1523   AST 17 06/16/2013 1946   ALT 12 12/16/2022 1523   ALT 17 06/16/2013 1946   ALKPHOS 58 12/16/2022 1523   ALKPHOS 80 06/16/2013 1946   BILITOT 0.4 12/16/2022 1523   BILITOT 0.2 08/25/2022 0941   BILITOT 0.3 06/16/2013 1946   EGFR 89 07/14/2023 0936   GFRNONAA >60 12/16/2022 1523   GFRNONAA >60 06/16/2013 1946     No results found.     Assessment & Plan:   1. Abdominal aortic aneurysm (AAA) without rupture, unspecified part (HCC) (Primary) Recommend: No surgery or intervention is indicated at this time.  The patient has an asymptomatic abdominal aortic aneurysm that is less than 4 cm in maximal diameter.    I  have reviewed the natural history of abdominal aortic aneurysm and the small risk of rupture for aneurysm less than 5 cm in size.  However, as these small aneurysms tend to enlarge over time, continued surveillance with ultrasound or CT scan is mandatory.   I have also discussed optimizing medical management with hypertension and lipid control and the negative effect that any tobacco products have on aneurysmal disease.  The patient is also encouraged to exercise a minimum of 30 minutes 4 times a week.   Should the patient develop new onset abdominal or back pain or signs of peripheral embolization they are instructed to seek medical attention immediately and to alert the physician providing care that they have an aneurysm.   The patient voices their understanding.  The patient will return in 12 months with an aortic duplex.  2. Essential hypertension Continue antihypertensive medications as already ordered, these medications have been reviewed and there are no changes at this time.  3. Mixed hyperlipidemia Continue statin as ordered and reviewed, no changes at this time   Current Outpatient Medications on File Prior to Visit  Medication Sig Dispense Refill   albuterol  (VENTOLIN  HFA) 108 (90 Base) MCG/ACT inhaler INHALE 2 PUFFS INTO THE LUNGS EVERY 6 HOURS AS NEEDED FOR WHEEZING OR SHORTNESS OF BREATH 18 g 1   ALPRAZolam  (XANAX ) 0.5 MG  tablet Take 1 tablet (0.5 mg total) by mouth at bedtime as needed for sleep. 30 tablet 0   budesonide -formoterol  (SYMBICORT ) 160-4.5 MCG/ACT inhaler INHALE 2 PUFFS INTO THE LUNGS TWICE DAILY 10.2 g 1   Calcium  Carbonate-Vit D-Min (CALCIUM  1200 PO) Take by mouth daily.     cetirizine  (ZYRTEC ) 10 MG tablet Take 1 tablet (10 mg total) by mouth daily. (Patient taking differently: Take 10 mg by mouth as needed.) 30 tablet 11   Chlorpheniramine-DM (DIMETAPP LONG ACT COUGH/COLD) 1-7.5 MG/5ML SYRP Take 10 mLs by mouth every 6 (six) hours as needed. 118 mL 0    clopidogrel  (PLAVIX ) 75 MG tablet TAKE 1 TABLET(75 MG) BY MOUTH DAILY 90 tablet 0   Coenzyme Q10 (CO Q 10 PO) Take 2 capsules by mouth every morning.     empagliflozin  (JARDIANCE ) 10 MG TABS tablet TAKE 1 TABLET(10 MG) BY MOUTH DAILY BEFORE BREAKFAST 30 tablet 11   Evolocumab  (REPATHA  SURECLICK) 140 MG/ML SOAJ Inject 140 mg into the skin every 14 (fourteen) days. 6 mL 3   fluticasone  (FLONASE ) 50 MCG/ACT nasal spray Place 2 sprays into both nostrils daily. 16 g 6   Folic Acid (FOLATE PO) Take 1 tablet by mouth daily.     furosemide  (LASIX ) 40 MG tablet TAKE 1 TABLET(40 MG) BY MOUTH DAILY AS NEEDED 30 tablet 2   hydrOXYzine  (ATARAX ) 25 MG tablet TAKE 1 TABLET(25 MG) BY MOUTH THREE TIMES DAILY AS NEEDED (Patient taking differently: No sig reported) 90 tablet 2   lisinopril  (ZESTRIL ) 5 MG tablet Take 1 tablet (5 mg total) by mouth daily. 90 tablet 3   Magnesium  400 MG CAPS Take 550 mg by mouth. 2 in the AM and 2 in the PM     meloxicam  (MOBIC ) 15 MG tablet TAKE 1 TABLET(15 MG) BY MOUTH DAILY (Patient taking differently: as needed for pain. TAKE 1 TABLET(15 MG) BY MOUTH DAILY) 90 tablet 3   nystatin  ointment (MYCOSTATIN ) Apply 1 Application topically 2 (two) times daily. 30 g 0   omeprazole  (PRILOSEC) 40 MG capsule TAKE 1 CAPSULE(40 MG) BY MOUTH DAILY 90 capsule 0   Potassium Gluconate 595 MG CAPS Take 400 mg by mouth in the morning, at noon, in the evening, and at bedtime. Take 4 capsules daily     triamcinolone  cream (KENALOG ) 0.1 % Apply 1 application. topically 2 (two) times daily. 80 g 1   Current Facility-Administered Medications on File Prior to Visit  Medication Dose Route Frequency Provider Last Rate Last Admin   albuterol  (PROVENTIL ) (2.5 MG/3ML) 0.083% nebulizer solution 2.5 mg  2.5 mg Nebulization Once Bensimhon, Toribio SAUNDERS, MD        There are no Patient Instructions on file for this visit. No follow-ups on file.   Chantea Surace E Maxene Byington, NP

## 2023-08-09 ENCOUNTER — Other Ambulatory Visit: Payer: Self-pay | Admitting: Pediatrics

## 2023-08-11 NOTE — Telephone Encounter (Signed)
 Requested Prescriptions  Pending Prescriptions Disp Refills   omeprazole  (PRILOSEC) 40 MG capsule [Pharmacy Med Name: OMEPRAZOLE  40MG  CAPSULES] 90 capsule 0    Sig: TAKE 1 CAPSULE(40 MG) BY MOUTH DAILY     Gastroenterology: Proton Pump Inhibitors Passed - 08/11/2023  2:21 PM      Passed - Valid encounter within last 12 months    Recent Outpatient Visits           4 weeks ago Adjustment disorder with mixed anxiety and depressed mood   [facility] Herold Hadassah SQUIBB, MD   4 months ago Essential hypertension   [place] [facility] Herold Hadassah SQUIBB, MD

## 2023-08-18 ENCOUNTER — Encounter: Payer: Self-pay | Admitting: Pediatrics

## 2023-08-18 ENCOUNTER — Ambulatory Visit (INDEPENDENT_AMBULATORY_CARE_PROVIDER_SITE_OTHER): Admitting: Pediatrics

## 2023-08-18 VITALS — BP 119/70 | HR 51 | Temp 97.9°F | Wt 153.2 lb

## 2023-08-18 DIAGNOSIS — I272 Pulmonary hypertension, unspecified: Secondary | ICD-10-CM | POA: Diagnosis not present

## 2023-08-18 DIAGNOSIS — J432 Centrilobular emphysema: Secondary | ICD-10-CM

## 2023-08-18 DIAGNOSIS — R252 Cramp and spasm: Secondary | ICD-10-CM

## 2023-08-18 DIAGNOSIS — E538 Deficiency of other specified B group vitamins: Secondary | ICD-10-CM | POA: Diagnosis not present

## 2023-08-18 DIAGNOSIS — F410 Panic disorder [episodic paroxysmal anxiety] without agoraphobia: Secondary | ICD-10-CM

## 2023-08-18 DIAGNOSIS — R0982 Postnasal drip: Secondary | ICD-10-CM

## 2023-08-18 DIAGNOSIS — E782 Mixed hyperlipidemia: Secondary | ICD-10-CM

## 2023-08-18 DIAGNOSIS — K219 Gastro-esophageal reflux disease without esophagitis: Secondary | ICD-10-CM

## 2023-08-18 MED ORDER — OMEPRAZOLE 40 MG PO CPDR: 40.0000 mg | DELAYED_RELEASE_CAPSULE | Freq: Every day | ORAL | 3 refills | Status: AC

## 2023-08-18 MED ORDER — ALBUTEROL SULFATE HFA 108 (90 BASE) MCG/ACT IN AERS
2.0000 | INHALATION_SPRAY | Freq: Four times a day (QID) | RESPIRATORY_TRACT | 6 refills | Status: AC | PRN
Start: 2023-08-18 — End: ?

## 2023-08-18 NOTE — Assessment & Plan Note (Signed)
 Prescribe an albuterol  inhaler with six refills for emergency use.

## 2023-08-18 NOTE — Assessment & Plan Note (Signed)
 Exacerbated in setting of life stressors. We discussed prn xanax  one time last visit which she has not needed or used. Symptoms well controlled by patient but will reassess at follow up.

## 2023-08-18 NOTE — Assessment & Plan Note (Signed)
 Fluid intake is limited to prevent exacerbation of pulmonary hypertension, which may contribute to dehydration and muscle cramps.

## 2023-08-18 NOTE — Assessment & Plan Note (Signed)
 Persistent symptoms continue despite current treatment. Consult ENT about Singulair (montelukast) and recommend Mucinex  for symptomatic relief.

## 2023-08-18 NOTE — Assessment & Plan Note (Signed)
 On going cramping ?numbness, will repeat levels. Has continued daily supplementation.

## 2023-08-18 NOTE — Patient Instructions (Signed)
 Ask the ENT about singulair (motelukast) for drainage

## 2023-08-18 NOTE — Assessment & Plan Note (Signed)
Due for repeat blood work

## 2023-08-18 NOTE — Assessment & Plan Note (Signed)
 GERD is managed with omeprazole . Prescribe a refill for omeprazole  (Prilosec).

## 2023-08-18 NOTE — Progress Notes (Signed)
 Office Visit  BP 119/70   Pulse (!) 51   Temp 97.9 F (36.6 C) (Oral)   Wt 153 lb 3.2 oz (69.5 kg)   LMP  (LMP Unknown)   SpO2 96%   BMI 25.49 kg/m    Subjective:    Patient ID: Brandi Steele, female    DOB: September 02, 1955, 68 y.o.   MRN: [id number]  HPI: Brandi Steele is a 68 y.o. female  Chief Complaint  Patient presents with   Anxiety   muscle cramp    Discussed the use of AI scribe software for clinical note transcription with the patient, who gave verbal consent to proceed.  History of Present Illness   Brandi Steele is a 68 year old female with pulmonary hypertension who presents with muscle cramps and sleep disturbances.  She experiences significant muscle cramps, particularly in her hands, legs, calves, and feet. The pain is severe, causing her fingers and toes to splay apart, necessitating getting out of bed to walk it off. The cramping has increased in her hands, affecting her ability to perform tasks such as cooking, as she cannot chop for long periods without her hands 'going crazy.'  She has a history of pulmonary hypertension and has been on Lasix , which has led to the loss of magnesium , potassium, and calcium . She takes supplements for these deficiencies, including magnesium , but continues to experience cramping. She is unsure if she is consuming enough fluids, typically drinking three to four 16-ounce bottles of water daily.  She has been experiencing sleep disturbances, which she attributes to the cramps. She has not taken the prescribed alprazolam  (Xanax ) for sleep, preferring to keep it as a backup due to stress related to family issues.  She mentions a dietary change to prevent further weight loss, as she was previously losing weight while following a cholesterol-lowering diet. Her current diet includes more variety, such as chicken and occasional [place] food, to maintain her weight.  She reports a history of hearing issues and is scheduled to receive  hearing aids. She has been experiencing congestion, voice changes, and drainage from her nose and eyes, which she attributes to ongoing congestion. No relief with Flonase  or Zyrtec . She is considering other options for symptom management.  She has a history of acid reflux and is currently taking omeprazole  (Prilosec). She also mentions a past episode of respiratory distress that required hospitalization and is concerned about having an albuterol  inhaler available for emergencies.        Relevant past medical, surgical, family and social history reviewed and updated as indicated. Interim medical history since our last visit reviewed. Allergies and medications reviewed and updated.  ROS per HPI unless specifically indicated above     Objective:    BP 119/70   Pulse (!) 51   Temp 97.9 F (36.6 C) (Oral)   Wt 153 lb 3.2 oz (69.5 kg)   LMP  (LMP Unknown)   SpO2 96%   BMI 25.49 kg/m   Wt Readings from Last 3 Encounters:  08/18/23 153 lb 3.2 oz (69.5 kg)  08/03/23 152 lb 4 oz (69.1 kg)  07/14/23 152 lb 6.4 oz (69.1 kg)     Physical Exam Constitutional:      Appearance: Normal appearance.  Pulmonary:     Effort: Pulmonary effort is normal.  Musculoskeletal:        General: Normal range of motion.  Skin:    Comments: Normal skin color  Neurological:     General:  No focal deficit present.     Mental Status: She is alert. Mental status is at baseline.  Psychiatric:        Mood and Affect: Mood normal.        Behavior: Behavior normal.        Thought Content: Thought content normal.         08/18/2023    9:12 AM 07/14/2023    8:55 AM 04/07/2023    8:17 AM 01/12/2023    3:56 PM 11/06/2022   10:56 AM  Depression screen PHQ 2/9  Decreased Interest 0 0 0 0 0  Down, Depressed, Hopeless 0 0 0 0 0  PHQ - 2 Score 0 0 0 0 0  Altered sleeping 0 0 0 0 0  Tired, decreased energy 0 0 0 0 0  Change in appetite 0 0 0 0 0  Feeling bad or failure about yourself  0 0 0 0 0  Trouble  concentrating 0 0 0 0 0  Moving slowly or fidgety/restless 0 0 0 0 0  Suicidal thoughts 0 0 0 0 0  PHQ-9 Score 0 0 0 0 0  Difficult doing work/chores Not difficult at all Not difficult at all Not difficult at all Not difficult at all Not difficult at all       08/18/2023    9:12 AM 07/14/2023    8:56 AM 04/07/2023    8:17 AM 01/12/2023    3:56 PM  GAD 7 : Generalized Anxiety Score  Nervous, Anxious, on Edge 0 0 0 0  Control/stop worrying 0 0 0 0  Worry too much - different things 0 0 0 0  Trouble relaxing 0 0 0 0  Restless 0 0 0 0  Easily annoyed or irritable 0 0 0 0  Afraid - awful might happen 0 0 0 0  Total GAD 7 Score 0 0 0 0  Anxiety Difficulty Not difficult at all Not difficult at all Not difficult at all Not difficult at all       Assessment & Plan:  Assessment & Plan   Low folate Assessment & Plan: On going cramping ?numbness, will repeat levels. Has continued daily supplementation.  Orders: -     Folate -     CBC with Differential/Platelet  Centrilobular emphysema (HCC) Assessment & Plan: Prescribe an albuterol  inhaler with six refills for emergency use.  Orders: -     Albuterol  Sulfate HFA; Inhale 2 puffs into the lungs every 6 (six) hours as needed for wheezing or shortness of breath.  Dispense: 18 g; Refill: 6  Mixed hyperlipidemia Assessment & Plan: Due for repeat blood work.  Orders: -     Lipid panel  Pulmonary hypertension (HCC) Assessment & Plan: Fluid intake is limited to prevent exacerbation of pulmonary hypertension, which may contribute to dehydration and muscle cramps.   Panic disorder Assessment & Plan: Exacerbated in setting of life stressors. We discussed prn xanax  one time last visit which she has not needed or used. Symptoms well controlled by patient but will reassess at follow up.   Post-nasal drip Assessment & Plan: Persistent symptoms continue despite current treatment. Consult ENT about Singulair (montelukast) and recommend  Mucinex  for symptomatic relief. Of note, scheduled for hearing aids (PND not contributing to hearing changes, per patient and ENT eval).   Gastroesophageal reflux disease, unspecified whether esophagitis present Assessment & Plan: GERD is managed with omeprazole . Prescribe a refill for omeprazole  (Prilosec).  Orders: -     Omeprazole ;  Take 1 capsule (40 mg total) by mouth daily.  Dispense: 90 capsule; Refill: 3   Muscle cramping Muscle cramps have increased in frequency and severity, likely due to electrolyte imbalances from long-term Lasix  use. Check electrolytes and advise stretching and exercises before bed. -     Magnesium  -     Comprehensive metabolic panel with GFR -     Vitamin B12   Follow up plan: Return in about 3 months (around 11/18/2023) for Chronic illness f/u.  Brandi SHAUNNA Nett, MD

## 2023-08-19 ENCOUNTER — Other Ambulatory Visit: Payer: Self-pay | Admitting: Pediatrics

## 2023-08-19 LAB — CBC WITH DIFFERENTIAL/PLATELET
Basophils Absolute: 0 x10E3/uL (ref 0.0–0.2)
Basos: 1 %
EOS (ABSOLUTE): 0.1 x10E3/uL (ref 0.0–0.4)
Eos: 1 %
Hematocrit: 47.3 % — ABNORMAL HIGH (ref 34.0–46.6)
Hemoglobin: 14.6 g/dL (ref 11.1–15.9)
Immature Grans (Abs): 0 x10E3/uL (ref 0.0–0.1)
Immature Granulocytes: 0 %
Lymphocytes Absolute: 1.2 x10E3/uL (ref 0.7–3.1)
Lymphs: 21 %
MCH: 29 pg (ref 26.6–33.0)
MCHC: 30.9 g/dL — ABNORMAL LOW (ref 31.5–35.7)
MCV: 94 fL (ref 79–97)
Monocytes Absolute: 0.5 x10E3/uL (ref 0.1–0.9)
Monocytes: 8 %
Neutrophils Absolute: 4 x10E3/uL (ref 1.4–7.0)
Neutrophils: 69 %
Platelets: 183 x10E3/uL (ref 150–450)
RBC: 5.04 x10E6/uL (ref 3.77–5.28)
RDW: 14.4 % (ref 11.7–15.4)
WBC: 5.8 x10E3/uL (ref 3.4–10.8)

## 2023-08-19 LAB — COMPREHENSIVE METABOLIC PANEL WITH GFR
ALT: 12 IU/L (ref 0–32)
AST: 22 IU/L (ref 0–40)
Albumin: 4.5 g/dL (ref 3.9–4.9)
Alkaline Phosphatase: 63 IU/L (ref 44–121)
BUN/Creatinine Ratio: 21 (ref 12–28)
BUN: 14 mg/dL (ref 8–27)
Bilirubin Total: 0.2 mg/dL (ref 0.0–1.2)
CO2: 25 mmol/L (ref 20–29)
Calcium: 9.6 mg/dL (ref 8.7–10.3)
Chloride: 103 mmol/L (ref 96–106)
Creatinine, Ser: 0.67 mg/dL (ref 0.57–1.00)
Globulin, Total: 2.3 g/dL (ref 1.5–4.5)
Glucose: 79 mg/dL (ref 70–99)
Potassium: 4.7 mmol/L (ref 3.5–5.2)
Sodium: 142 mmol/L (ref 134–144)
Total Protein: 6.8 g/dL (ref 6.0–8.5)
eGFR: 95 mL/min/1.73 (ref >59)

## 2023-08-19 LAB — LIPID PANEL
Chol/HDL Ratio: 3 ratio (ref 0.0–4.4)
Cholesterol, Total: 162 mg/dL (ref 100–199)
HDL: 54 mg/dL (ref >39)
LDL Chol Calc (NIH): 91 mg/dL (ref 0–99)
Triglycerides: 89 mg/dL (ref 0–149)
VLDL Cholesterol Cal: 17 mg/dL (ref 5–40)

## 2023-08-19 LAB — MAGNESIUM: Magnesium: 2.2 mg/dL (ref 1.6–2.3)

## 2023-08-19 LAB — VITAMIN B12: Vitamin B-12: 194 pg/mL — ABNORMAL LOW (ref 232–1245)

## 2023-08-19 LAB — FOLATE: Folate: 7.1 ng/mL (ref >3.0)

## 2023-08-20 ENCOUNTER — Ambulatory Visit: Payer: Self-pay | Admitting: Pediatrics

## 2023-09-01 ENCOUNTER — Ambulatory Visit (INDEPENDENT_AMBULATORY_CARE_PROVIDER_SITE_OTHER)

## 2023-09-01 ENCOUNTER — Ambulatory Visit

## 2023-09-01 DIAGNOSIS — E538 Deficiency of other specified B group vitamins: Secondary | ICD-10-CM

## 2023-09-01 MED ORDER — CYANOCOBALAMIN 1000 MCG/ML IJ SOLN
1000.0000 ug | Freq: Once | INTRAMUSCULAR | Status: AC
  Administered 2023-09-01: 1000 ug via INTRAMUSCULAR

## 2023-09-08 ENCOUNTER — Ambulatory Visit

## 2023-09-09 ENCOUNTER — Encounter: Payer: Self-pay | Admitting: Pediatrics

## 2023-09-10 ENCOUNTER — Ambulatory Visit: Attending: Cardiology | Admitting: Physician Assistant

## 2023-09-10 ENCOUNTER — Encounter: Payer: Self-pay | Admitting: Cardiology

## 2023-09-10 VITALS — BP 120/58 | HR 65 | Resp 18 | Ht 65.0 in | Wt 154.0 lb

## 2023-09-10 DIAGNOSIS — I272 Pulmonary hypertension, unspecified: Secondary | ICD-10-CM | POA: Diagnosis not present

## 2023-09-10 DIAGNOSIS — I10 Essential (primary) hypertension: Secondary | ICD-10-CM | POA: Diagnosis not present

## 2023-09-10 DIAGNOSIS — I2510 Atherosclerotic heart disease of native coronary artery without angina pectoris: Secondary | ICD-10-CM

## 2023-09-10 DIAGNOSIS — I5032 Chronic diastolic (congestive) heart failure: Secondary | ICD-10-CM | POA: Diagnosis not present

## 2023-09-10 DIAGNOSIS — E785 Hyperlipidemia, unspecified: Secondary | ICD-10-CM

## 2023-09-10 DIAGNOSIS — I452 Bifascicular block: Secondary | ICD-10-CM

## 2023-09-10 DIAGNOSIS — Z86711 Personal history of pulmonary embolism: Secondary | ICD-10-CM

## 2023-09-10 NOTE — Progress Notes (Signed)
 Cardiology Office Note    Date:  09/10/2023   ID:  ETHER GOEBEL, DOB [birth year], MRN [id number]  PCP:  Herold Hadassah SQUIBB, MD  Cardiologist:  Redell Cave, MD  Electrophysiologist:  None   Chief Complaint: Follow up  History of Present Illness:   Brandi Steele is a 68 y.o. female with history of chronic HFpEF, hypertension, coronary artery disease noted on CT imaging, AAA repair approximately 20 years ago, carotid artery stenosis, pulmonary hypertension, emphysema, chronic hypoxic respiratory failure, OSA on CPAP, GERD, fibromyalgia, and hyperlipidemia who presents for follow up on CAD, hypertension, and HFpEF.    Patient was previously followed by [facility] clinic.  Echo in 2015 showed biatrial enlargement with enlarged RV and normal RV systolic function.  Echo in 2018 was similar.  RVSP not commented on.  Echo completed at [facility] in 03/2021 demonstrated EF greater than 55% with moderate tricuspid regurgitation and severe pulmonary hypertension with PASP 74 mmHg.  Treadmill testing in 03/2021 showed no ischemic EKG changes.  Patient established with our practice in 06/2021.  At that time, she was doing well from a cardiac perspective.  Updated echocardiogram revealed LVEF of 60 to 65%, G2 DD, severely elevated PASP at 72.4 mmHg, biatrial dilation.  She followed up with pulmonary shortly after with symptoms related to pulmonary hypertension and underwent RHC which revealed moderate pulmonary hypertension and mixed atrial and venous etiology.  She was admitted to the hospital 02/02/2022 through 02/07/2022 for acute on chronic diastolic heart failure with minimally elevated BNP, chest x-ray showed diffuse interstitial opacities suggesting edema.  She was diuresed with IV Lasix .  On 1/11 she began requiring increased oxygen  demands, D-dimer was elevated, CTA showed multiple tiny defects consistent with pulmonary embolism.  Lower extremity duplex ultrasound was negative for DVTs.  She was started on  IV heparin  and then transition to apixaban  at discharge.  Clopidogrel  was held to minimize bleeding risk.  Echo revealed LVEF of 60 to 65%, mild LVH.  Carotid ultrasound 02/15/2022 revealed bilateral minimal stenosis.   Patient was most recently seen by cardiology clinic 03/05/2023 overall doing well from a cardiac perspective.  No further testing or medication changes were indicated at that time.  She continued to work on weight loss.  Patient was recently seen by advanced heart failure clinic 03/25/2023 and overall doing well who group III pulmonary hypertension.  No longer requiring oxygen  with 40+ pound weight loss.  Patient presents today overall doing well from a cardiac perspective.  She continues to stick to a low-cholesterol diet.  She stays extremely active walking, hiking, and kayaking.  She denies any symptoms of angina or cardiac decompensation.  She denies chest pain, shortness of breath, lightheadedness, dizziness, and lower extremity swelling.  She has not needed to take any as needed Lasix .  Her only complaint today is difficulty sleeping at night due to lower extremity cramping.  She feels that this is associated with dehydration.  Labs independently reviewed: 08/18/2023-Hgb 14.6, HCT 47.3, platelets 183, BUN 14, creatinine 0.67, sodium 142, potassium 4.7, normal LFTs, mag 2.2, TC 162, TG 89, HDL 54, LDL 91  Objective   Past Medical History:  Diagnosis Date   Anxiety    Aortic aneurysm (HCC)    Aortic stenosis    Caregiver role strain 12/05/2020   CHF (congestive heart failure) (HCC)    COPD (chronic obstructive pulmonary disease) (HCC)    Depression    Emphysema of lung (HCC)    Fibromyalgia  GERD (gastroesophageal reflux disease)    Hyperlipidemia    Hypertension    Hypertensive heart disease    IFG (impaired fasting glucose) 09/07/2019   Myalgia    Obesity    Prediabetes 08/25/2022   Pulmonary embolism (HCC) 02/06/2022   Sciatica    Seborrheic keratosis    Sleep  apnea    Stress incontinence     Current Medications: Current Meds  Medication Sig   albuterol  (VENTOLIN  HFA) 108 (90 Base) MCG/ACT inhaler Inhale 2 puffs into the lungs every 6 (six) hours as needed for wheezing or shortness of breath.   ALPRAZolam  (XANAX ) 0.5 MG tablet Take 1 tablet (0.5 mg total) by mouth at bedtime as needed for sleep.   azelastine (ASTELIN) 0.1 % nasal spray Place 1 spray into both nostrils 2 (two) times daily.   budesonide -formoterol  (SYMBICORT ) 160-4.5 MCG/ACT inhaler INHALE 2 PUFFS INTO THE LUNGS TWICE DAILY   Calcium  Carbonate-Vit D-Min (CALCIUM  1200 PO) Take by mouth daily.   cetirizine  (ZYRTEC ) 10 MG tablet Take 1 tablet (10 mg total) by mouth daily.   Chlorpheniramine-DM (DIMETAPP LONG ACT COUGH/COLD) 1-7.5 MG/5ML SYRP Take 10 mLs by mouth every 6 (six) hours as needed.   clopidogrel  (PLAVIX ) 75 MG tablet TAKE 1 TABLET(75 MG) BY MOUTH DAILY   Coenzyme Q10 (CO Q 10 PO) Take 2 capsules by mouth every morning.   Evolocumab  (REPATHA  SURECLICK) 140 MG/ML SOAJ Inject 140 mg into the skin every 14 (fourteen) days.   Folic Acid (FOLATE PO) Take 1 tablet by mouth daily.   hydrOXYzine  (ATARAX ) 25 MG tablet TAKE 1 TABLET(25 MG) BY MOUTH THREE TIMES DAILY AS NEEDED   JARDIANCE  10 MG TABS tablet TAKE 1 TABLET(10 MG) BY MOUTH DAILY BEFORE BREAKFAST   lisinopril  (ZESTRIL ) 5 MG tablet Take 1 tablet (5 mg total) by mouth daily.   Magnesium  400 MG CAPS Take 550 mg by mouth. 2 in the AM and 2 in the PM   meloxicam  (MOBIC ) 15 MG tablet TAKE 1 TABLET(15 MG) BY MOUTH DAILY   nystatin  ointment (MYCOSTATIN ) Apply 1 Application topically 2 (two) times daily.   omeprazole  (PRILOSEC) 40 MG capsule Take 1 capsule (40 mg total) by mouth daily.   Potassium Gluconate 595 MG CAPS Take 400 mg by mouth in the morning, at noon, in the evening, and at bedtime. Take 4 capsules daily   triamcinolone  cream (KENALOG ) 0.1 % Apply 1 application. topically 2 (two) times daily.    Allergies:   Morphine  and codeine , Celexa [citalopram hydrobromide], Effexor [venlafaxine], Lipitor [atorvastatin ], Shellfish allergy, and Wellbutrin [bupropion]   Social History   Socioeconomic History   Marital status: Widowed    Spouse name: Not on file   Number of children: 3   Years of education: Not on file   Highest education level: Not on file  Occupational History   Occupation: retired  Tobacco Use   Smoking status: Former    Current packs/day: 0.00    Average packs/day: 1 pack/day for 40.0 years (40.0 ttl pk-yrs)    Types: Cigarettes    Start date: 11/01/1976    Quit date: 11/01/2016    Years since quitting: 6.8   Smokeless tobacco: Never   Tobacco comments:    Is trying cold-turkey with social support  Vaping Use   Vaping status: Former   Start date: 12/02/2016   Quit date: 09/06/2021  Substance and Sexual Activity   Alcohol use: No    Alcohol/week: 0.0 standard drinks of alcohol   Drug  use: No   Sexual activity: Not Currently  Other Topics Concern   Not on file  Social History Narrative   Husband passed away 2022-10-09, 3 children, 1 passed from drug use   Social Drivers of Corporate investment banker Strain: Low Risk  (10/28/2022)   Overall Financial Resource Strain (CARDIA)    Difficulty of Paying Living Expenses: Not hard at all  Food Insecurity: No Food Insecurity (10/28/2022)   Hunger Vital Sign    Worried About Running Out of Food in the Last Year: Never true    Ran Out of Food in the Last Year: Never true  Transportation Needs: No Transportation Needs (10/28/2022)   PRAPARE - Administrator, Civil Service (Medical): No    Lack of Transportation (Non-Medical): No  Physical Activity: Insufficiently Active (10/28/2022)   Exercise Vital Sign    Days of Exercise per Week: 5 days    Minutes of Exercise per Session: 10 min  Stress: No Stress Concern Present (10/28/2022)   [employer] of Occupational Health - Occupational Stress Questionnaire    Feeling of Stress :  Only a little  Social Connections: Moderately Isolated (10/28/2022)   Social Connection and Isolation Panel    Frequency of Communication with Friends and Family: More than three times a week    Frequency of Social Gatherings with Friends and Family: More than three times a week    Attends Religious Services: More than 4 times per year    Active Member of [employer] or Organizations: No    Attends Banker Meetings: Never    Marital Status: Widowed     Family History:  [name] family history includes Diabetes in her brother; Heart disease in her father, paternal grandfather, and paternal grandmother; Hypertension in her mother; Stroke in her maternal grandfather, maternal grandmother, and mother. There is no history of Breast cancer.  ROS:   12-point review of systems is negative unless otherwise noted in the HPI.  EKGs/Other Studies Reviewed:    Studies reviewed were summarized above. The additional studies were reviewed today:  03/25/2023 Echo complete 1. Left ventricular ejection fraction, by estimation, is 60 to 65%. The  left ventricle has normal function. The left ventricle has no regional  wall motion abnormalities. Left ventricular diastolic parameters are  consistent with Grade I diastolic  dysfunction (impaired relaxation). The average left ventricular global  longitudinal strain is -18.8 %. The global longitudinal strain is normal.   2. Right ventricular systolic function is normal. The right ventricular  size is normal. There is normal pulmonary artery systolic pressure. The  estimated right ventricular systolic pressure is 33.2 mmHg.   3. The mitral valve is normal in structure. Mild mitral valve  regurgitation. No evidence of mitral stenosis.   4. The aortic valve is tricuspid. There is mild calcification of the  aortic valve. Aortic valve regurgitation is not visualized. Aortic valve  sclerosis is present, with no evidence of aortic valve stenosis.   5.  The inferior vena cava is normal in size with greater than 50%  respiratory variability, suggesting right atrial pressure of 3 mmHg.   EKG:  EKG personally reviewed by me today EKG Interpretation Date/Time:  Thursday September 10 2023 09:14:52 EDT Ventricular Rate:  50 PR Interval:  174 QRS Duration:  128 QT Interval:  434 QTC Calculation: 395 R Axis:   -85  Text Interpretation: Sinus bradycardia Right bundle branch block Left anterior fascicular block Bifascicular block When compared with  ECG of 05-Mar-2023 09:26, No significant change was found Confirmed by Lorene Sinclair ([id number]) on 09/10/2023 9:16:57 AM  PHYSICAL EXAM:    VS:  BP (!) 120/58 (BP Location: Left Arm, Patient Position: Sitting, Cuff Size: Normal)   Pulse 65   Resp 18   Ht 5' 5 (1.651 m)   Wt 154 lb (69.9 kg)   LMP  (LMP Unknown)   SpO2 94%   BMI 25.63 kg/m   BMI: Body mass index is 25.63 kg/m.  Physical Exam Vitals and nursing note reviewed.  Constitutional:      General: She is not in acute distress.    Appearance: Normal appearance.  Cardiovascular:     Rate and Rhythm: Normal rate and regular rhythm.     Heart sounds: No murmur heard. Pulmonary:     Effort: Pulmonary effort is normal. No respiratory distress.     Breath sounds: No wheezing or rales.  Musculoskeletal:     Right lower leg: No edema.     Left lower leg: No edema.  Skin:    General: Skin is warm and dry.  Neurological:     General: No focal deficit present.     Mental Status: She is alert and oriented to person, place, and time. Mental status is at baseline.  Psychiatric:        Mood and Affect: Mood normal.        Behavior: Behavior normal.     Wt Readings from Last 3 Encounters:  09/10/23 154 lb (69.9 kg)  08/18/23 153 lb 3.2 oz (69.5 kg)  08/03/23 152 lb 4 oz (69.1 kg)      ASSESSMENT & PLAN:   Chronic HFpEF - Most recent echo 02/2023 showed EF 60 to 65%, G1 DD with normal pulmonary artery systolic pressure.  Appears euvolemic  on exam.  She is continued on Jardiance  10 mg daily and furosemide  40 mg daily as needed.  Mild to moderate pulmonary hypertension - Most recent echo with normal RVSP 33.  Denies any shortness of breath.  Continues to follow with advanced heart failure.  Coronary artery disease - RCA calcification noted on chest CT.  No symptoms of angina or cardiac decompensation.  EKG today shows sinus rhythm with RBBB and LAFB, unchanged from prior.  She is continued on clopidogrel  75 mg daily and Repatha .  Hx PE - Noted on CTA 01/2022.  Completed anticoagulation.  Hyperlipidemia - LDL 91.  She continues to focus on low-cholesterol diet.  She is continued on Repatha  every 2 weeks.  Hypertension - Blood pressure is well-controlled.  She is continued on lisinopril  5 mg daily.    Disposition: F/u with Dr. Darliss or an APP in 6 months.   Medication Adjustments/Labs and Tests Ordered: Current medicines are reviewed at length with the patient today.  Concerns regarding medicines are outlined above. Medication changes, Labs and Tests ordered today are summarized above and listed in the Patient Instructions accessible in Encounters.   Bonney Sinclair Lorene, PA-C 09/10/2023 9:51 AM     [facility] [place] [address] [place], [place] [address] [phone]

## 2023-09-10 NOTE — Patient Instructions (Signed)
 Medication Instructions:  Your physician recommends that you continue on your current medications as directed. Please refer to the Current Medication list given to you today.   *If you need a refill on your cardiac medications before your next appointment, please call your pharmacy*  Lab Work: No labs ordered today  If you have labs (blood work) drawn today and your tests are completely normal, you will receive your results only by: MyChart Message (if you have MyChart) OR A paper copy in the mail If you have any lab test that is abnormal or we need to change your treatment, we will call you to review the results.  Testing/Procedures: No test ordered today   Follow-Up: At [facility], you and your health needs are our priority.  As part of our continuing mission to provide you with exceptional heart care, our providers are all part of one team.  This team includes your primary Cardiologist (physician) and Advanced Practice Providers or APPs (Physician Assistants and Nurse Practitioners) who all work together to provide you with the care you need, when you need it.  Your next appointment:   6 month(s)  Provider:   Lesley Maffucci, PA-C

## 2023-09-15 ENCOUNTER — Ambulatory Visit (INDEPENDENT_AMBULATORY_CARE_PROVIDER_SITE_OTHER)

## 2023-09-15 DIAGNOSIS — E538 Deficiency of other specified B group vitamins: Secondary | ICD-10-CM

## 2023-09-15 MED ORDER — CYANOCOBALAMIN 1000 MCG/ML IJ SOLN
1000.0000 ug | INTRAMUSCULAR | Status: AC
  Administered 2023-09-15 – 2023-09-29 (×3): 1000 ug via INTRAMUSCULAR

## 2023-09-15 NOTE — Progress Notes (Signed)
 Patient is in office today for a nurse visit for B12 Injection. Patient Injection was given in the  Left deltoid. Patient tolerated injection well.

## 2023-09-22 ENCOUNTER — Ambulatory Visit

## 2023-09-22 DIAGNOSIS — E538 Deficiency of other specified B group vitamins: Secondary | ICD-10-CM | POA: Diagnosis not present

## 2023-09-29 ENCOUNTER — Ambulatory Visit

## 2023-09-29 ENCOUNTER — Ambulatory Visit (INDEPENDENT_AMBULATORY_CARE_PROVIDER_SITE_OTHER)

## 2023-09-29 DIAGNOSIS — E538 Deficiency of other specified B group vitamins: Secondary | ICD-10-CM

## 2023-10-02 ENCOUNTER — Ambulatory Visit

## 2023-10-07 ENCOUNTER — Other Ambulatory Visit: Payer: Self-pay | Admitting: Pharmacist Clinician (PhC)/ Clinical Pharmacy Specialist

## 2023-10-07 MED ORDER — REPATHA SURECLICK 140 MG/ML ~~LOC~~ SOAJ: 140.0000 mg | SUBCUTANEOUS | 3 refills | Status: AC

## 2023-10-21 ENCOUNTER — Other Ambulatory Visit: Payer: Self-pay | Admitting: Pediatrics

## 2023-10-21 NOTE — Telephone Encounter (Signed)
 Copied from CRM #[id number]. Topic: Clinical - Medication Refill >> Oct 21, 2023  9:37 AM Debby BROCKS wrote: Medication: JARDIANCE  10 MG TABS tablet   Has the patient contacted their pharmacy? Yes (Agent: If no, request that the patient contact the pharmacy for the refill. If patient does not wish to contact the pharmacy document the reason why and proceed with request.) (Agent: If yes, when and what did the pharmacy advise?) Yes, was advised she has no more refills   This is the patient's preferred pharmacy:  [id number] [place],  - [address] AT [facility] OF [address] [place] [address] [place] [address] Phone: [phone] Fax: [phone]   Is this the correct pharmacy for this prescription? Yes If no, delete pharmacy and type the correct one.    Has the prescription been filled recently? No   Is the patient out of the medication? Yes   Has the patient been seen for an appointment in the last year OR does the patient have an upcoming appointment? Yes   Can we respond through MyChart? Yes   Agent: Please be advised that Rx refills may take up to 3 business days. We ask that you follow-up with your pharmacy.

## 2023-10-22 ENCOUNTER — Other Ambulatory Visit: Payer: Self-pay | Admitting: Cardiology

## 2023-10-26 NOTE — Telephone Encounter (Signed)
 Copied from CRM #[id number]. Topic: Clinical - Medication Refill >> Oct 21, 2023  9:37 AM Debby BROCKS wrote: Medication: JARDIANCE  10 MG TABS tablet  Has the patient contacted their pharmacy? Yes (Agent: If no, request that the patient contact the pharmacy for the refill. If patient does not wish to contact the pharmacy document the reason why and proceed with request.) (Agent: If yes, when and what did the pharmacy advise?) Yes, was advised she has no more refills  This is the patient's preferred pharmacy:  [id number] [place], [place] - [address] AT [facility] OF [address] [place] [address] [place] [address] Phone: [phone] Fax: [phone]  Is this the correct pharmacy for this prescription? Yes If no, delete pharmacy and type the correct one.   Has the prescription been filled recently? No  Is the patient out of the medication? Yes  Has the patient been seen for an appointment in the last year OR does the patient have an upcoming appointment? Yes  Can we respond through MyChart? Yes  Agent: Please be advised that Rx refills may take up to 3 business days. We ask that you follow-up with your pharmacy. >> Oct 26, 2023  9:50 AM Mia F wrote: Pt is following up on the medication request. She states she has been without the medication. Per chart med was refused because it was too soon to refill however pt is out

## 2023-10-27 ENCOUNTER — Other Ambulatory Visit: Payer: Self-pay | Admitting: Pediatrics

## 2023-10-27 DIAGNOSIS — I509 Heart failure, unspecified: Secondary | ICD-10-CM

## 2023-10-27 MED ORDER — EMPAGLIFLOZIN 10 MG PO TABS: 10.0000 mg | ORAL_TABLET | Freq: Every day | ORAL | 3 refills | Status: AC

## 2023-10-27 NOTE — Progress Notes (Signed)
 Resending jardiance  refills.  Brandi SHAUNNA Nett, MD

## 2023-11-03 ENCOUNTER — Ambulatory Visit

## 2023-11-04 ENCOUNTER — Ambulatory Visit

## 2023-11-04 ENCOUNTER — Other Ambulatory Visit: Payer: Self-pay | Admitting: Pediatrics

## 2023-11-04 DIAGNOSIS — Z1231 Encounter for screening mammogram for malignant neoplasm of breast: Secondary | ICD-10-CM

## 2023-11-18 ENCOUNTER — Ambulatory Visit: Admitting: Pediatrics

## 2023-11-26 ENCOUNTER — Ambulatory Visit
Admission: RE | Admit: 2023-11-26 | Discharge: 2023-11-26 | Disposition: A | Discharge status: Home / Self Care | Source: Ambulatory Visit | Attending: Pediatrics | Admitting: Pediatrics

## 2023-11-26 DIAGNOSIS — Z1231 Encounter for screening mammogram for malignant neoplasm of breast: Secondary | ICD-10-CM | POA: Insufficient documentation

## 2023-12-01 ENCOUNTER — Ambulatory Visit

## 2023-12-01 DIAGNOSIS — E538 Deficiency of other specified B group vitamins: Secondary | ICD-10-CM

## 2023-12-01 MED ORDER — CYANOCOBALAMIN 1000 MCG/ML IJ SOLN
1000.0000 ug | Freq: Once | INTRAMUSCULAR | Status: AC
  Administered 2023-12-01: 1000 ug via INTRAMUSCULAR

## 2023-12-01 NOTE — Progress Notes (Signed)
 Patient is in office today for a nurse visit for B12 Injection. Patient Injection was given in the  Left deltoid. Patient tolerated injection well.

## 2023-12-02 ENCOUNTER — Ambulatory Visit: Payer: Self-pay | Admitting: Pediatrics

## 2023-12-09 ENCOUNTER — Ambulatory Visit
Admission: RE | Admit: 2023-12-09 | Discharge: 2023-12-09 | Disposition: A | Discharge status: Home / Self Care | Source: Ambulatory Visit | Attending: Acute Care | Admitting: Acute Care

## 2023-12-09 DIAGNOSIS — Z87891 Personal history of nicotine dependence: Secondary | ICD-10-CM | POA: Insufficient documentation

## 2023-12-09 DIAGNOSIS — Z122 Encounter for screening for malignant neoplasm of respiratory organs: Secondary | ICD-10-CM | POA: Insufficient documentation

## 2023-12-10 ENCOUNTER — Ambulatory Visit: Admitting: Internal Medicine

## 2023-12-10 ENCOUNTER — Encounter: Payer: Self-pay | Admitting: Internal Medicine

## 2023-12-10 VITALS — BP 110/70 | HR 64 | Temp 98.2°F | Ht 65.0 in | Wt 157.2 lb

## 2023-12-10 DIAGNOSIS — G4733 Obstructive sleep apnea (adult) (pediatric): Secondary | ICD-10-CM

## 2023-12-10 DIAGNOSIS — J449 Chronic obstructive pulmonary disease, unspecified: Secondary | ICD-10-CM | POA: Diagnosis not present

## 2023-12-10 NOTE — Progress Notes (Signed)
 @Patient  ID: Brandi Steele, female    DOB: September 30, 1955, 68 y.o.   MRN: [id number]  Chief complaint Follow-up OSA Follow-up PE Follow-up pulmonary hypertension Follow-up COPD  HPI:  Regarding OSA Patient has excellent compliance report Patient uses and benefits from therapy AHI significantly reduced Patient states that just her mask is bothering her and too much pressure Plan to reduce her auto CPAP 4-10 Patient has lost a significant amount of weight 40 pounds in the last 6 months I am questioning whether or not her AHI has resolved with weight loss Plan to repeat home sleep study to assess for sleep apnea We also discussed the inspire device as an option   Regarding pulmonary hypertension Patient currently on anticoagulation for diagnosis of PE Patient does also have COPD Patient sees Dr. Chet with cardiology No longer has a diagnosis of pulmonary hypertension Main reason is significant weight loss  Regarding COPD Patient currently on Symbicort  but does not seem to be helping Patient has excellent exercise tolerance and capacity since her weight loss I do believe her symptoms have significantly improved We will plan to wean off all types of inhalers at this time    Cardiac History  TTE obtained through the [facility] system demonstrates normal LV systolic function, grade 2 diastolic dysfunction, dilated left atrium, dilated right atrium, mildly reduced RV function with enlarged RV size and estimated elevated RVSP.  Compared to most recent echocardiogram 03/2021 in the [facility] system demonstrates similarly mildly enlarged RV with newly reduced RV function, similar LA dilation, only grade 1 diastolic dysfunction at that time.  At last visit she was prescribed oxygen  therapy given signs of hypoxemia with walking.  She reports good adherence to this.   CTA PE protocol same day personally reviewed with mild emphysematous changes, stable 2 mm right lower lobe nodule on my  interpretation. CT chest January 2024  few subsegmental branches in right upper lobe and right lower lobe. Findings suggest pulmonary embolism with minimal thrombus burden.  PMH: Anxiety, GERD, CAD, hyperlipidemia Surgical history: Hysterectomy Family history: Father with CAD, with CAD, CVA Social history: Former smoker, 40-pack-year, reportedly quit 2019, lives in [place]    Lab Results: Personally reviewed CBC    Component Value Date/Time   WBC 5.8 08/18/2023 0957   WBC 6.9 12/16/2022 1523   RBC 5.04 08/18/2023 0957   RBC 4.62 12/16/2022 1523   HGB 14.6 08/18/2023 0957   HCT 47.3 (H) 08/18/2023 0957   PLT 183 08/18/2023 0957   MCV 94 08/18/2023 0957   MCV 93 06/16/2013 1946   MCH 29.0 08/18/2023 0957   MCH 28.4 12/16/2022 1523   MCHC 30.9 (L) 08/18/2023 0957   MCHC 32.0 12/16/2022 1523   RDW 14.4 08/18/2023 0957   RDW 15.0 (H) 06/16/2013 1946   LYMPHSABS 1.2 08/18/2023 0957   LYMPHSABS 2.4 06/16/2013 1946   MONOABS 0.5 12/16/2022 1523   MONOABS 0.6 06/16/2013 1946   EOSABS 0.1 08/18/2023 0957   EOSABS 0.1 06/16/2013 1946   BASOSABS 0.0 08/18/2023 0957   BASOSABS 0.1 06/16/2013 1946    BMET    Component Value Date/Time   NA 142 08/18/2023 0957   NA 143 06/16/2013 1946   K 4.7 08/18/2023 0957   K 4.0 06/16/2013 1946   CL 103 08/18/2023 0957   CL 103 06/16/2013 1946   CO2 25 08/18/2023 0957   CO2 31 06/16/2013 1946   GLUCOSE 79 08/18/2023 0957   GLUCOSE 96 12/16/2022 1523   GLUCOSE 93  06/16/2013 1946   BUN 14 08/18/2023 0957   BUN 13 06/16/2013 1946   CREATININE 0.67 08/18/2023 0957   CREATININE 0.63 06/16/2013 1946   CALCIUM  9.6 08/18/2023 0957   CALCIUM  9.0 06/16/2013 1946   GFRNONAA >60 12/16/2022 1523   GFRNONAA >60 06/16/2013 1946   GFRAA 106 12/08/2019 1020   GFRAA >60 06/16/2013 1946    BNP    Component Value Date/Time   BNP 108.5 (H) 02/02/2022 1359    Specialty Problems       Pulmonary Problems   Centrilobular emphysema (HCC)    Sleep apnea    Allergies  Allergen Reactions   Morphine And Codeine  Nausea And Vomiting   Celexa [Citalopram Hydrobromide] Other (See Comments)    Pruritis    Effexor [Venlafaxine] Other (See Comments)    Panic attack   Lipitor [Atorvastatin ] Other (See Comments)    Myalgias   Shellfish Allergy Hives and Swelling   Wellbutrin [Bupropion] Anxiety    Immunization History  Administered Date(s) Administered   Fluad Quad(high Dose 65+) 11/17/2018   Influenza,inj,Quad PF,6+ Mos 10/09/2015, 12/26/2016   Influenza-Unspecified 11/22/2017   PNEUMOCOCCAL CONJUGATE-20 10/28/2022   Pneumococcal Conjugate-13 03/03/2019   Pneumococcal Polysaccharide-23 03/28/2009   Td 06/14/2004   Tdap 05/28/2015    Past Medical History:  Diagnosis Date   Anxiety    Aortic aneurysm    Aortic stenosis    Caregiver role strain 12/05/2020   CHF (congestive heart failure) (HCC)    COPD (chronic obstructive pulmonary disease) (HCC)    Depression    Emphysema of lung (HCC)    Fibromyalgia    GERD (gastroesophageal reflux disease)    Hyperlipidemia    Hypertension    Hypertensive heart disease    IFG (impaired fasting glucose) 09/07/2019   Myalgia    Obesity    Prediabetes 08/25/2022   Pulmonary embolism (HCC) 02/06/2022   Sciatica    Seborrheic keratosis    Sleep apnea    Stress incontinence     Tobacco History: Social History   Tobacco Use  Smoking Status Former   Current packs/day: 0.00   Average packs/day: 1 pack/day for 40.0 years (40.0 ttl pk-yrs)   Types: Cigarettes   Start date: 11/01/1976   Quit date: 11/01/2016   Years since quitting: 7.1  Smokeless Tobacco Never  Tobacco Comments   Is trying cold-turkey with social support   Counseling given: Not Answered Tobacco comments: Is trying cold-turkey with social support   Continue to not smoke  Outpatient Encounter Medications as of 12/10/2023  Medication Sig   albuterol  (VENTOLIN  HFA) 108 (90 Base) MCG/ACT inhaler Inhale 2  puffs into the lungs every 6 (six) hours as needed for wheezing or shortness of breath.   ALPRAZolam  (XANAX ) 0.5 MG tablet Take 1 tablet (0.5 mg total) by mouth at bedtime as needed for sleep.   azelastine (ASTELIN) 0.1 % nasal spray Place 1 spray into both nostrils 2 (two) times daily.   budesonide -formoterol  (SYMBICORT ) 160-4.5 MCG/ACT inhaler INHALE 2 PUFFS INTO THE LUNGS TWICE DAILY   Calcium  Carbonate-Vit D-Min (CALCIUM  1200 PO) Take by mouth daily.   cetirizine  (ZYRTEC ) 10 MG tablet Take 1 tablet (10 mg total) by mouth daily.   Chlorpheniramine-DM (DIMETAPP LONG ACT COUGH/COLD) 1-7.5 MG/5ML SYRP Take 10 mLs by mouth every 6 (six) hours as needed.   clopidogrel  (PLAVIX ) 75 MG tablet TAKE 1 TABLET(75 MG) BY MOUTH DAILY   Coenzyme Q10 (CO Q 10 PO) Take 2 capsules by mouth every morning.  empagliflozin  (JARDIANCE ) 10 MG TABS tablet Take 1 tablet (10 mg total) by mouth daily.   Evolocumab  (REPATHA  SURECLICK) 140 MG/ML SOAJ Inject 140 mg into the skin every 14 (fourteen) days.   fluticasone  (FLONASE ) 50 MCG/ACT nasal spray Place 2 sprays into both nostrils daily. (Patient not taking: Reported on 09/10/2023)   Folic Acid (FOLATE PO) Take 1 tablet by mouth daily.   furosemide  (LASIX ) 40 MG tablet TAKE 1 TABLET(40 MG) BY MOUTH DAILY AS NEEDED (Patient not taking: Reported on 09/10/2023)   hydrOXYzine  (ATARAX ) 25 MG tablet TAKE 1 TABLET(25 MG) BY MOUTH THREE TIMES DAILY AS NEEDED   lisinopril  (ZESTRIL ) 5 MG tablet Take 1 tablet (5 mg total) by mouth daily.   Magnesium  400 MG CAPS Take 550 mg by mouth. 2 in the AM and 2 in the PM   meloxicam  (MOBIC ) 15 MG tablet TAKE 1 TABLET(15 MG) BY MOUTH DAILY   nystatin  ointment (MYCOSTATIN ) Apply 1 Application topically 2 (two) times daily.   omeprazole  (PRILOSEC) 40 MG capsule Take 1 capsule (40 mg total) by mouth daily.   Potassium Gluconate 595 MG CAPS Take 400 mg by mouth in the morning, at noon, in the evening, and at bedtime. Take 4 capsules daily    triamcinolone  cream (KENALOG ) 0.1 % Apply 1 application. topically 2 (two) times daily.   Facility-Administered Encounter Medications as of 12/10/2023  Medication   albuterol  (PROVENTIL ) (2.5 MG/3ML) 0.083% nebulizer solution 2.5 mg   BP 110/70   Pulse 64   Temp 98.2 F (36.8 C)   Ht 5' 5 (1.651 m)   Wt 157 lb 3.2 oz (71.3 kg)   LMP  (LMP Unknown)   SpO2 94%   BMI 26.16 kg/m     Physical Examination:  General Appearance: No distress  EYES EOM intact.   NECK Supple, No JVD Pulmonary: normal breath sounds, No wheezing.  CardiovascularNormal S1,S2.  No m/r/g.   Ext pulses intact, cap refill intact  ALL OTHER ROS ARE NEGATIVE    Assessment & Plan:   68 year old pleasant white female seen today for multiple pulmonary issues including chronic hypoxic respiratory failure in the setting of pulmonary hypertension with moderate COPD in the setting of sleep apnea with  obesity and deconditioned state   After significant weight loss, there may be resolution of her symptoms for COPD as well as a decreased in her AHI.  Patient is more active and her exercise capacity is significantly improved patient no longer using oxygen   Assessment of OSA Will repeat home sleep study for further assessment however in the meantime we will adjust her auto CPAP 4-10 Continue CPAP as prescribed  Excellent compliance report Reviewed compliance report in detail with patient Patient definitely benefits the use of CPAP therapy as prescribed Using CPAP nightly and with naps  No evidence of acute heart failure at this time No respiratory distress No fevers, chills, nausea, vomiting, diarrhea No evidence hemoptysis  Patient Instructions Continue to use CPAP every night, minimum of 4-6 hours a night.  Change equipment every 30 days or as directed by DME.  Wash your tubing with warm soap and water daily, hang to dry.  Wash humidifier portion weekly. Use bottled, distilled water and change daily  Risk  of untreated sleep apnea including cardiac arrhthymias, stroke, DM, pulm HTN.    COPD Stable at this time  Plan to wean off all types of inhalers at this time   Pulmonary hypertension Resolved with significant weight loss Follow-up with cardiology as  needed  Chronic Hypoxic resp failure due to COPD Resolved patient no longer using oxygen    MEDICATION ADJUSTMENTS/LABS AND TESTS ORDERED: Stop inhalers Repeat HST Continue auto CPAP until further instruction  Avoid secondhand smoke Avoid SICK contacts Recommend  Masking  when appropriate Recommend Keep up-to-date with vaccinations   MEDICATION ADJUSTMENTS/LABS AND TESTS ORDERED:    CURRENT MEDICATIONS REVIEWED AT LENGTH WITH PATIENT TODAY   Patient  satisfied with Plan of action and management. All questions answered   Follow up 3 months   I spent a total of 45 minutes dedicated to the care of this patient on the date of this encounter to include pre-visit review of records, face-to-face time with the patient discussing conditions above, post visit ordering of testing, clinical documentation with the electronic health record, making appropriate referrals as documented, and communicating necessary information to the patient's healthcare team.    The Patient requires high complexity decision making for assessment and support, frequent evaluation and titration of therapies, application of advanced monitoring technologies and extensive interpretation of multiple databases.  Patient satisfied with Plan of action and management. All questions answered    Nickolas Alm Cellar, M.D.  [facility] Medicine  Medical Director [facility]

## 2023-12-10 NOTE — Patient Instructions (Addendum)
 Congratulations on weight loss!!!  Excellent Job A+ GOLD STAR!!  Continue CPAP as prescribed  Patient Instructions Continue to use CPAP every night, minimum of 4-6 hours a night.  Change equipment every 30 days or as directed by DME.  Wash your tubing with warm soap and water daily, hang to dry. Wash humidifier portion weekly. Use bottled, distilled water and change daily   Be aware of reduced alertness and do not drive or operate heavy machinery if experiencing this or drowsiness.  Exercise encouraged, as tolerated. Encouraged proper weight management.  Important to get eight or more hours of sleep  Limiting the use of the computer and television before bedtime.  Decrease naps during the day, so night time sleep will become enhanced.  Limit caffeine, and sleep deprivation.    Avoid Allergens and Irritants Avoid secondhand smoke Avoid SICK contacts Recommend  Masking  when appropriate Recommend Keep up-to-date with vaccinations  Please stop all inhalers and assess breathing

## 2023-12-16 ENCOUNTER — Other Ambulatory Visit: Payer: Self-pay

## 2023-12-16 DIAGNOSIS — Z122 Encounter for screening for malignant neoplasm of respiratory organs: Secondary | ICD-10-CM

## 2023-12-16 DIAGNOSIS — Z87891 Personal history of nicotine dependence: Secondary | ICD-10-CM

## 2023-12-17 ENCOUNTER — Telehealth: Payer: Self-pay

## 2023-12-17 DIAGNOSIS — G4733 Obstructive sleep apnea (adult) (pediatric): Secondary | ICD-10-CM

## 2023-12-17 NOTE — Telephone Encounter (Signed)
 Copied from CRM [id number]. Topic: Clinical - Request for Lab/Test Order >> Dec 17, 2023  3:03 PM Devaughn RAMAN wrote: Reason for CRM: Pt called to inquire if she can have an overnight sleep study done at a facility versus at home. Please f/u with pt regarding this.

## 2023-12-17 NOTE — Telephone Encounter (Signed)
 Okay to place an order for in lab sleep study?

## 2023-12-22 NOTE — Telephone Encounter (Signed)
 Per secure chat with Dr. Isaiah- okay to place order for in lab sleep study.  Order has been placed and the patient is aware.  Nothing further needed.

## 2023-12-28 ENCOUNTER — Telehealth: Payer: Self-pay | Admitting: Pharmacy Technician

## 2023-12-28 NOTE — Telephone Encounter (Signed)
    Pharmacy Patient Advocate Encounter   Received notification from Pt Calls Messages that prior authorization for repatha  is required/requested.   Insurance verification completed.   The patient is insured through [facility].   Per test claim: PA required; PA submitted to above mentioned insurance via Latent Key/confirmation #/EOC [id number] Status is pending

## 2023-12-28 NOTE — Telephone Encounter (Signed)
 Pharmacy Patient Advocate Encounter  Received notification from [facility] that Prior Authorization for repatha  has been APPROVED from 12/28/23 to 01/26/25   PA #/Case ID/Reference #: [id number]

## 2023-12-29 ENCOUNTER — Ambulatory Visit

## 2023-12-29 DIAGNOSIS — E538 Deficiency of other specified B group vitamins: Secondary | ICD-10-CM

## 2023-12-29 MED ORDER — CYANOCOBALAMIN 1000 MCG/ML IJ SOLN
1000.0000 ug | Freq: Once | INTRAMUSCULAR | Status: AC
  Administered 2023-12-29: 1000 ug via INTRAMUSCULAR

## 2023-12-29 NOTE — Progress Notes (Signed)
 Patient is in office today for a nurse visit for B12 Injection. Patient Injection was given in the  Left deltoid. Patient tolerated injection well.

## 2023-12-31 ENCOUNTER — Ambulatory Visit

## 2023-12-31 VITALS — Ht 65.0 in | Wt 153.0 lb

## 2023-12-31 DIAGNOSIS — Z0000 Encounter for general adult medical examination without abnormal findings: Secondary | ICD-10-CM

## 2023-12-31 NOTE — Patient Instructions (Signed)
 Brandi Steele,  Thank you for taking the time for your Medicare Wellness Visit. I appreciate your continued commitment to your health goals. Please review the care plan we discussed, and feel free to reach out if I can assist you further.  Please note that Annual Wellness Visits do not include a physical exam. Some assessments may be limited, especially if the visit was conducted virtually. If needed, we may recommend an in-person follow-up with your provider.  Ongoing Care Seeing your primary care provider every 3 to 6 months helps us  monitor your health and provide consistent, personalized care.   Referrals If a referral was made during today's visit and you haven't received any updates within two weeks, please contact the referred provider directly to check on the status.  Recommended Screenings:    Call to schedule a routine eye exam at your earliest convenience. We recommend an eye exam every 1-2 years.   Health Maintenance  Topic Date Due   Zoster (Shingles) Vaccine (1 of 2) Never done   Flu Shot  08/28/2023   COVID-19 Vaccine (1 - 2025-26 season) Never done   Medicare Annual Wellness Visit  10/28/2023   Breast Cancer Screening  11/25/2024   Screening for Lung Cancer  12/08/2024   DTaP/Tdap/Td vaccine (3 - Td or Tdap) 05/27/2025   Osteoporosis screening with Bone Density Scan  11/01/2025   Colon Cancer Screening  02/16/2033   Pneumococcal Vaccine for age over 74  Completed   Hepatitis C Screening  Completed   Meningitis B Vaccine  Aged Out       12/31/2023    2:47 PM  Advanced Directives  Does Patient Have a Medical Advance Directive? No  Would patient like information on creating a medical advance directive? No - Patient declined    Vision: Annual vision screenings are recommended for early detection of glaucoma, cataracts, and diabetic retinopathy. These exams can also reveal signs of chronic conditions such as diabetes and high blood pressure.  Dental: Annual dental  screenings help detect early signs of oral cancer, gum disease, and other conditions linked to overall health, including heart disease and diabetes.  Please see the attached documents for additional preventive care recommendations.

## 2023-12-31 NOTE — Progress Notes (Signed)
 Chief Complaint  Patient presents with   Medicare Wellness     Subjective:   Brandi Steele is a 68 y.o. female who presents for a Medicare Annual Wellness Visit.  Visit info / Clinical Intake: Medicare Wellness Visit Type:: Subsequent Annual Wellness Visit Persons participating in visit and providing information:: patient Medicare Wellness Visit Mode:: Telephone If telephone:: video declined Since this visit was completed virtually, some vitals may be partially provided or unavailable. Missing vitals are due to the limitations of the virtual format.: Documented vitals are patient reported If Telephone or Video please confirm:: I connected with patient using audio/video enable telemedicine. I verified patient identity with two identifiers, discussed telehealth limitations, and patient agreed to proceed. Patient Location:: home Provider Location:: home Interpreter Needed?: No Pre-visit prep was completed: yes AWV questionnaire completed by patient prior to visit?: yes Date:: 12/26/23 Living arrangements:: with family/others Patient's Overall Health Status Rating: very good Typical amount of pain: some (muscle cramps) Does pain affect daily life?: (!) yes Are you currently prescribed opioids?: no  Dietary Habits and Nutritional Risks How many meals a day?: 2 Eats fruit and vegetables daily?: yes Most meals are obtained by: preparing own meals In the last 2 weeks, have you had any of the following?: none Diabetic:: no  Functional Status Activities of Daily Living (to include ambulation/medication): Independent Ambulation: Independent with device- listed below Home Assistive Devices/Equipment: Other (Comment); CPAP (hearing aids) Medication Administration: Independent Home Management (perform basic housework or laundry): Independent Manage your own finances?: yes Primary transportation is: driving Concerns about vision?: (!) yes (needs routine eye exam) Concerns about  hearing?: no (wears hearing aids)  Fall Screening Falls in the past year?: 0 Number of falls in past year: 0 Was there an injury with Fall?: 0 Fall Risk Category Calculator: 0 Patient Fall Risk Level: Low Fall Risk  Fall Risk Patient at Risk for Falls Due to: No Fall Risks Fall risk Follow up: Falls evaluation completed  Home and Transportation Safety: All rugs have non-skid backing?: N/A, no rugs All stairs or steps have railings?: (!) no (2 steps without handrails) Grab bars in the bathtub or shower?: yes Have non-skid surface in bathtub or shower?: (!) no Good home lighting?: yes Regular seat belt use?: yes Hospital stays in the last year:: no  Cognitive Assessment Difficulty concentrating, remembering, or making decisions? : no Will 6CIT or Mini Cog be Completed: yes What year is it?: 0 points What month is it?: 0 points Give patient an address phrase to remember (5 components): [address] [place] About what time is it?: 0 points Count backwards from 20 to 1: 0 points Say the months of the year in reverse: 0 points Repeat the address phrase from earlier: 0 points 6 CIT Score: 0 points  Advance Directives (For Healthcare) Does Patient Have a Medical Advance Directive?: No Would patient like information on creating a medical advance directive?: No - Patient declined  Reviewed/Updated  Reviewed/Updated: Reviewed All (Medical, Surgical, Family, Medications, Allergies, Care Teams, Patient Goals)    Allergies (verified) Morphine and codeine , Celexa [citalopram hydrobromide], Effexor [venlafaxine], Lipitor [atorvastatin ], Shellfish allergy, and Wellbutrin [bupropion]   Current Medications (verified) Outpatient Encounter Medications as of 12/31/2023  Medication Sig   albuterol  (VENTOLIN  HFA) 108 (90 Base) MCG/ACT inhaler Inhale 2 puffs into the lungs every 6 (six) hours as needed for wheezing or shortness of breath.   ALPRAZolam  (XANAX ) 0.5 MG tablet Take 1 tablet  (0.5 mg total) by mouth at  bedtime as needed for sleep.   Calcium  Carbonate-Vit D-Min (CALCIUM  1200 PO) Take by mouth daily.   cetirizine  (ZYRTEC ) 10 MG tablet Take 1 tablet (10 mg total) by mouth daily. (Patient taking differently: Take 10 mg by mouth daily. PRN)   Chlorpheniramine-DM (DIMETAPP LONG ACT COUGH/COLD) 1-7.5 MG/5ML SYRP Take 10 mLs by mouth every 6 (six) hours as needed.   clopidogrel  (PLAVIX ) 75 MG tablet TAKE 1 TABLET(75 MG) BY MOUTH DAILY   Coenzyme Q10 (CO Q 10 PO) Take 2 capsules by mouth every morning.   empagliflozin  (JARDIANCE ) 10 MG TABS tablet Take 1 tablet (10 mg total) by mouth daily.   Evolocumab  (REPATHA  SURECLICK) 140 MG/ML SOAJ Inject 140 mg into the skin every 14 (fourteen) days.   Folic Acid (FOLATE PO) Take 1 tablet by mouth daily.   hydrOXYzine  (ATARAX ) 25 MG tablet TAKE 1 TABLET(25 MG) BY MOUTH THREE TIMES DAILY AS NEEDED   lisinopril  (ZESTRIL ) 5 MG tablet Take 1 tablet (5 mg total) by mouth daily.   Magnesium  400 MG CAPS Take 550 mg by mouth. 2 in the AM and 2 in the PM   meloxicam  (MOBIC ) 15 MG tablet TAKE 1 TABLET(15 MG) BY MOUTH DAILY (Patient taking differently: TAKE 1 TABLET(15 MG) BY MOUTH DAILY Taking PRN)   nystatin  ointment (MYCOSTATIN ) Apply 1 Application topically 2 (two) times daily. (Patient taking differently: Apply 1 Application topically 2 (two) times daily. PRN)   omeprazole  (PRILOSEC) 40 MG capsule Take 1 capsule (40 mg total) by mouth daily.   Potassium Gluconate 595 MG CAPS Take 400 mg by mouth in the morning, at noon, in the evening, and at bedtime. Take 4 capsules daily   triamcinolone  cream (KENALOG ) 0.1 % Apply 1 application. topically 2 (two) times daily. (Patient taking differently: Apply 1 application  topically 2 (two) times daily. PRN)   azelastine (ASTELIN) 0.1 % nasal spray Place 1 spray into both nostrils 2 (two) times daily. (Patient not taking: Reported on 12/31/2023)   budesonide -formoterol  (SYMBICORT ) 160-4.5 MCG/ACT inhaler  INHALE 2 PUFFS INTO THE LUNGS TWICE DAILY (Patient not taking: Reported on 12/31/2023)   fluticasone  (FLONASE ) 50 MCG/ACT nasal spray Place 2 sprays into both nostrils daily. (Patient not taking: Reported on 12/31/2023)   furosemide  (LASIX ) 40 MG tablet TAKE 1 TABLET(40 MG) BY MOUTH DAILY AS NEEDED (Patient not taking: Reported on 12/31/2023)   Facility-Administered Encounter Medications as of 12/31/2023  Medication   albuterol  (PROVENTIL ) (2.5 MG/3ML) 0.083% nebulizer solution 2.5 mg    History: Past Medical History:  Diagnosis Date   Allergy    Anxiety    Aortic aneurysm    Aortic stenosis    Arthritis    Caregiver role strain 12/05/2020   Cataract    CHF (congestive heart failure) (HCC)    COPD (chronic obstructive pulmonary disease) (HCC)    Depression    Emphysema of lung (HCC)    Fibromyalgia    GERD (gastroesophageal reflux disease)    Hyperlipidemia    Hypertension    Hypertensive heart disease    IFG (impaired fasting glucose) 09/07/2019   Myalgia    Obesity    Oxygen  deficiency    Prediabetes 08/25/2022   Pulmonary embolism (HCC) 02/06/2022   Sciatica    Seborrheic keratosis    Sleep apnea    Stress incontinence    Past Surgical History:  Procedure Laterality Date   CARDIAC CATHETERIZATION     CESAREAN SECTION     COLONOSCOPY WITH PROPOFOL  N/A 02/17/2023  Procedure: COLONOSCOPY WITH PROPOFOL ;  Surgeon: Therisa Bi, MD;  Location: [facility] ENDOSCOPY;  Service: Gastroenterology;  Laterality: N/A;   EYE SURGERY     RIGHT HEART CATH N/A 10/07/2021   Procedure: RIGHT HEART CATH;  Surgeon: Darron Deatrice LABOR, MD;  Location: [facility] INVASIVE CV LAB;  Service: Cardiovascular;  Laterality: N/A;   TONSILLECTOMY     TOTAL ABDOMINAL HYSTERECTOMY     partial   TUBAL LIGATION     Family History  Problem Relation Age of Onset   Stroke Mother    Hypertension Mother    Heart disease Father    Early death Father    Diabetes Brother    Stroke Maternal Grandmother    Stroke  Maternal Grandfather    Heart disease Paternal Grandmother        Heart disease   Heart disease Paternal Grandfather        Heart disease   Breast cancer Neg Hx    Social History   Occupational History   Occupation: retired  Tobacco Use   Smoking status: Former    Current packs/day: 0.00    Average packs/day: 1 pack/day for 40.0 years (40.0 ttl pk-yrs)    Types: Cigarettes    Start date: 11/01/1976    Quit date: 11/01/2016    Years since quitting: 7.1   Smokeless tobacco: Never   Tobacco comments:    Is trying cold-turkey with social support  Vaping Use   Vaping status: Former   Start date: 12/02/2016   Quit date: 09/06/2021  Substance and Sexual Activity   Alcohol use: Not Currently   Drug use: Never   Sexual activity: Yes   Tobacco Counseling Counseling given: Not Answered Tobacco comments: Is trying cold-turkey with social support  SDOH Screenings   Food Insecurity: No Food Insecurity (12/31/2023)  Housing: Low Risk  (12/31/2023)  Transportation Needs: No Transportation Needs (12/31/2023)  Utilities: Not At Risk (12/31/2023)  Alcohol Screen: Low Risk  (10/28/2022)  Depression (PHQ2-9): Low Risk  (12/31/2023)  Financial Resource Strain: Low Risk  (12/26/2023)  Physical Activity: Sufficiently Active (12/31/2023)  Social Connections: Moderately Integrated (12/31/2023)  Stress: No Stress Concern Present (12/31/2023)  Tobacco Use: Medium Risk (12/31/2023)  Health Literacy: Adequate Health Literacy (12/31/2023)   See flowsheets for full screening details  Depression Screen PHQ 2 & 9 Depression Scale- Over the past 2 weeks, how often have you been bothered by any of the following problems? Little interest or pleasure in doing things: 0 Feeling down, depressed, or hopeless (PHQ Adolescent also includes...irritable): 0 PHQ-2 Total Score: 0 Trouble falling or staying asleep, or sleeping too much: 0 Feeling tired or having little energy: 0 Poor appetite or overeating (PHQ  Adolescent also includes...weight loss): 0 Feeling bad about yourself - or that you are a failure or have let yourself or your family down: 0 Trouble concentrating on things, such as reading the newspaper or watching television (PHQ Adolescent also includes...like school work): 0 Moving or speaking so slowly that other people could have noticed. Or the opposite - being so fidgety or restless that you have been moving around a lot more than usual: 0 Thoughts that you would be better off dead, or of hurting yourself in some way: 0 PHQ-9 Total Score: 0 If you checked off any problems, how difficult have these problems made it for you to do your work, take care of things at home, or get along with other people?: Not difficult at all  Depression Treatment Depression Interventions/Treatment :  PHQ2-9 Score <4 Follow-up Not Indicated     Goals Addressed               This Visit's Progress     Stay healthy (pt-stated)               Objective:    Today's Vitals   12/31/23 1440  Weight: 153 lb (69.4 kg)  Height: 5' 5 (1.651 m)   Body mass index is 25.46 kg/m.  Hearing/Vision screen Hearing Screening - Comments:: Wears hearing aids Vision Screening - Comments:: Needs routine eye exam. Patient will call [facility] to schedule appontment Immunizations and Health Maintenance Health Maintenance  Topic Date Due   Zoster Vaccines- Shingrix (1 of 2) Never done   COVID-19 Vaccine (1 - 2025-26 season) Never done   Influenza Vaccine  04/26/2024 (Originally 08/28/2023)   Mammogram  11/25/2024   Lung Cancer Screening  12/08/2024   Medicare Annual Wellness (AWV)  12/30/2024   DTaP/Tdap/Td (3 - Td or Tdap) 05/27/2025   Bone Density Scan  11/01/2025   Colonoscopy  02/16/2033   Pneumococcal Vaccine: 50+ Years  Completed   Hepatitis C Screening  Completed   Meningococcal B Vaccine  Aged Out        Assessment/Plan:  This is a routine wellness examination for Bhumi.  Patient Care  Team: Cannady, Jolene T, NP as PCP - General (Nurse Practitioner) Darliss Rogue, MD as PCP - Cardiology (Cardiology) Isaiah Scrivener, MD as Consulting Physician (Pulmonary Disease) Bensimhon, Toribio SAUNDERS, MD as Consulting Physician (Cardiology) Schnier, Cordella MATSU, MD (Vascular Surgery) Pa, [facility] (Optometry) Therisa Bi, MD as Consulting Physician (Gastroenterology) Cathlyn Seal, MD as Referring Physician (Dermatology)  I have personally reviewed and noted the following in the patient's chart:   Medical and social history Use of alcohol, tobacco or illicit drugs  Current medications and supplements including opioid prescriptions. Functional ability and status Nutritional status Physical activity Advanced directives List of other physicians Hospitalizations, surgeries, and ER visits in previous 12 months Vitals Screenings to include cognitive, depression, and falls Referrals and appointments  No orders of the defined types were placed in this encounter.  In addition, I have reviewed and discussed with patient certain preventive protocols, quality metrics, and best practice recommendations. A written personalized care plan for preventive services as well as general preventive health recommendations were provided to patient.   Vina Ned, CMA   12/31/2023   Return in 1 year (on 01/03/2025) for Medicare Annual Wellness Visit.  After Visit Summary: (MyChart) Due to this being a telephonic visit, the after visit summary with patients personalized plan was offered to patient via MyChart   Nurse Notes:  Needs routine eye exam. Patient to call and schedule appointment. Declined flu, shingles and covid vaccines

## 2024-02-16 ENCOUNTER — Telehealth: Payer: Self-pay | Admitting: Physician Assistant

## 2024-02-16 NOTE — Telephone Encounter (Signed)
 Called [facility] - they do have refills listed, however, it was entered without refills - patient able to pick up today or tomorrow - patient advised of RX availability  She also requests information on reducing the cost of repatha  - sending to PA and Pharm

## 2024-02-16 NOTE — Telephone Encounter (Signed)
 Pt c/o medication issue:  1. Name of Medication: Evolocumab  (REPATHA  SURECLICK) 140 MG/ML SOAJ   2. How are you currently taking this medication (dosage and times per day)? As written  3. Are you having a reaction (difficulty breathing--STAT)? No  4. What is your medication issue? Patient is stating pharmacy is requesting a new prescription

## 2024-02-17 ENCOUNTER — Other Ambulatory Visit (HOSPITAL_COMMUNITY): Payer: Self-pay

## 2024-02-17 ENCOUNTER — Telehealth: Payer: Self-pay | Admitting: Pharmacy Technician

## 2024-02-17 NOTE — Telephone Encounter (Signed)
 Pharmacy Patient Advocate Encounter   Received notification from Physician's Office that prior authorization for repatha  is required/requested.   Insurance verification completed.   The patient is insured through [employer].   Per test claim: Refill too soon. PA is not needed at this time. Medication was filled 02/16/24. Next eligible fill date is 03/08/24.

## 2024-02-23 ENCOUNTER — Telehealth: Payer: Self-pay

## 2024-02-23 NOTE — Progress Notes (Signed)
 Pharmacy Quality Measure Review  This patient is appearing on a report for being at risk of failing the adherence measure for diabetes medications this calendar year.   Medication: Jardiance  10mg  tablet Last fill date: 01/23/2024 for 90 day supply  Patient is currently adherent. Will be due for a refill on 04/22/2024.  Lisle Slocumb Student - PharmD

## 2024-03-08 ENCOUNTER — Ambulatory Visit: Admitting: Physician Assistant

## 2024-03-24 ENCOUNTER — Ambulatory Visit: Admitting: Internal Medicine

## 2024-05-30 ENCOUNTER — Ambulatory Visit: Admitting: Nurse Practitioner

## 2024-08-02 ENCOUNTER — Other Ambulatory Visit (INDEPENDENT_AMBULATORY_CARE_PROVIDER_SITE_OTHER)

## 2024-08-02 ENCOUNTER — Ambulatory Visit (INDEPENDENT_AMBULATORY_CARE_PROVIDER_SITE_OTHER): Admitting: Vascular Surgery

## 2025-01-03 ENCOUNTER — Ambulatory Visit
